# Patient Record
Sex: Male | Born: 1935 | Race: White | Hispanic: No | Marital: Married | Smoking: Former smoker
Health system: Southern US, Community
[De-identification: ages and names within clinical notes are randomized; demographics above are authoritative.]

## PROBLEM LIST (undated history)

## (undated) DIAGNOSIS — I2510 Atherosclerotic heart disease of native coronary artery without angina pectoris: Secondary | ICD-10-CM

## (undated) DIAGNOSIS — N183 Chronic kidney disease, stage 3 unspecified: Secondary | ICD-10-CM

## (undated) DIAGNOSIS — I639 Cerebral infarction, unspecified: Secondary | ICD-10-CM

## (undated) DIAGNOSIS — D469 Myelodysplastic syndrome, unspecified: Principal | ICD-10-CM

## (undated) DIAGNOSIS — I6529 Occlusion and stenosis of unspecified carotid artery: Secondary | ICD-10-CM

## (undated) DIAGNOSIS — M1990 Unspecified osteoarthritis, unspecified site: Secondary | ICD-10-CM

## (undated) DIAGNOSIS — N433 Hydrocele, unspecified: Secondary | ICD-10-CM

## (undated) DIAGNOSIS — E785 Hyperlipidemia, unspecified: Secondary | ICD-10-CM

## (undated) DIAGNOSIS — I219 Acute myocardial infarction, unspecified: Secondary | ICD-10-CM

## (undated) DIAGNOSIS — G459 Transient cerebral ischemic attack, unspecified: Secondary | ICD-10-CM

## (undated) DIAGNOSIS — I10 Essential (primary) hypertension: Secondary | ICD-10-CM

## (undated) DIAGNOSIS — R0602 Shortness of breath: Secondary | ICD-10-CM

## (undated) DIAGNOSIS — I4891 Unspecified atrial fibrillation: Secondary | ICD-10-CM

## (undated) DIAGNOSIS — I5032 Chronic diastolic (congestive) heart failure: Secondary | ICD-10-CM

## (undated) DIAGNOSIS — Z992 Dependence on renal dialysis: Secondary | ICD-10-CM

## (undated) DIAGNOSIS — I509 Heart failure, unspecified: Secondary | ICD-10-CM

## (undated) DIAGNOSIS — Z9289 Personal history of other medical treatment: Secondary | ICD-10-CM

## (undated) HISTORY — DX: Chronic diastolic (congestive) heart failure: I50.32

## (undated) HISTORY — DX: Atherosclerotic heart disease of native coronary artery without angina pectoris: I25.10

## (undated) HISTORY — DX: Transient cerebral ischemic attack, unspecified: G45.9

## (undated) HISTORY — DX: Myelodysplastic syndrome, unspecified: D46.9

## (undated) HISTORY — PX: SURGERY SCROTAL / TESTICULAR: SUR1316

## (undated) HISTORY — DX: Occlusion and stenosis of unspecified carotid artery: I65.29

## (undated) HISTORY — DX: Hyperlipidemia, unspecified: E78.5

## (undated) HISTORY — DX: Personal history of other medical treatment: Z92.89

## (undated) HISTORY — PX: CORONARY ANGIOPLASTY WITH STENT PLACEMENT: SHX49

## (undated) HISTORY — DX: Essential (primary) hypertension: I10

---
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
## 1968-03-29 HISTORY — PX: OTHER SURGICAL HISTORY: SHX169

## 1984-03-29 HISTORY — PX: NOSE SURGERY: SHX723

## 1991-03-30 HISTORY — PX: EYE SURGERY: SHX253

## 1991-03-30 HISTORY — PX: OTHER SURGICAL HISTORY: SHX169

## 1995-03-30 HISTORY — PX: OTHER SURGICAL HISTORY: SHX169

## 1997-10-18 ENCOUNTER — Ambulatory Visit (HOSPITAL_COMMUNITY): Admission: RE | Admit: 1997-10-18 | Discharge: 1997-10-18 | Payer: Self-pay | Admitting: Gastroenterology

## 1999-01-28 ENCOUNTER — Inpatient Hospital Stay (HOSPITAL_COMMUNITY): Admission: EM | Admit: 1999-01-28 | Discharge: 1999-01-30 | Payer: Self-pay | Admitting: Emergency Medicine

## 1999-01-28 ENCOUNTER — Encounter: Payer: Self-pay | Admitting: Emergency Medicine

## 1999-01-29 ENCOUNTER — Encounter: Payer: Self-pay | Admitting: Cardiology

## 2000-02-19 ENCOUNTER — Encounter: Payer: Self-pay | Admitting: Cardiology

## 2000-02-19 ENCOUNTER — Inpatient Hospital Stay (HOSPITAL_COMMUNITY): Admission: EM | Admit: 2000-02-19 | Discharge: 2000-02-21 | Payer: Self-pay | Admitting: Emergency Medicine

## 2001-06-06 ENCOUNTER — Encounter (INDEPENDENT_AMBULATORY_CARE_PROVIDER_SITE_OTHER): Payer: Self-pay | Admitting: Specialist

## 2001-06-06 ENCOUNTER — Ambulatory Visit (HOSPITAL_COMMUNITY): Admission: RE | Admit: 2001-06-06 | Discharge: 2001-06-06 | Payer: Self-pay | Admitting: Gastroenterology

## 2004-03-02 ENCOUNTER — Ambulatory Visit: Payer: Self-pay | Admitting: *Deleted

## 2004-03-03 ENCOUNTER — Ambulatory Visit: Payer: Self-pay

## 2004-04-02 ENCOUNTER — Ambulatory Visit: Payer: Self-pay | Admitting: Internal Medicine

## 2004-04-21 ENCOUNTER — Ambulatory Visit: Payer: Self-pay | Admitting: *Deleted

## 2004-06-23 ENCOUNTER — Ambulatory Visit: Payer: Self-pay | Admitting: *Deleted

## 2004-07-23 ENCOUNTER — Ambulatory Visit (HOSPITAL_COMMUNITY): Admission: RE | Admit: 2004-07-23 | Discharge: 2004-07-23 | Payer: Self-pay | Admitting: Gastroenterology

## 2004-07-23 ENCOUNTER — Encounter (INDEPENDENT_AMBULATORY_CARE_PROVIDER_SITE_OTHER): Payer: Self-pay | Admitting: *Deleted

## 2004-11-30 ENCOUNTER — Emergency Department (HOSPITAL_COMMUNITY): Admission: EM | Admit: 2004-11-30 | Discharge: 2004-11-30 | Payer: Self-pay | Admitting: Emergency Medicine

## 2004-12-03 ENCOUNTER — Ambulatory Visit: Payer: Self-pay | Admitting: Internal Medicine

## 2004-12-07 ENCOUNTER — Ambulatory Visit: Payer: Self-pay

## 2004-12-10 ENCOUNTER — Ambulatory Visit: Payer: Self-pay | Admitting: Cardiology

## 2004-12-15 ENCOUNTER — Ambulatory Visit: Payer: Self-pay | Admitting: Cardiology

## 2004-12-18 ENCOUNTER — Ambulatory Visit: Payer: Self-pay | Admitting: Cardiology

## 2004-12-18 ENCOUNTER — Inpatient Hospital Stay (HOSPITAL_BASED_OUTPATIENT_CLINIC_OR_DEPARTMENT_OTHER): Admission: RE | Admit: 2004-12-18 | Discharge: 2004-12-18 | Payer: Self-pay | Admitting: Cardiology

## 2004-12-24 ENCOUNTER — Ambulatory Visit: Payer: Self-pay | Admitting: Family Medicine

## 2005-01-07 ENCOUNTER — Ambulatory Visit: Payer: Self-pay

## 2005-01-19 ENCOUNTER — Encounter: Admission: RE | Admit: 2005-01-19 | Discharge: 2005-01-19 | Payer: Self-pay | Admitting: Family Medicine

## 2005-03-15 ENCOUNTER — Ambulatory Visit: Payer: Self-pay | Admitting: Cardiology

## 2005-09-16 ENCOUNTER — Ambulatory Visit: Payer: Self-pay | Admitting: Cardiology

## 2005-11-26 ENCOUNTER — Encounter: Payer: Self-pay | Admitting: Internal Medicine

## 2005-11-26 ENCOUNTER — Inpatient Hospital Stay (HOSPITAL_COMMUNITY): Admission: EM | Admit: 2005-11-26 | Discharge: 2005-11-27 | Payer: Self-pay | Admitting: Emergency Medicine

## 2005-11-26 ENCOUNTER — Ambulatory Visit: Payer: Self-pay | Admitting: Internal Medicine

## 2005-11-27 ENCOUNTER — Inpatient Hospital Stay (HOSPITAL_COMMUNITY): Admission: EM | Admit: 2005-11-27 | Discharge: 2005-11-30 | Payer: Self-pay | Admitting: Emergency Medicine

## 2006-06-30 ENCOUNTER — Ambulatory Visit (HOSPITAL_BASED_OUTPATIENT_CLINIC_OR_DEPARTMENT_OTHER): Admission: RE | Admit: 2006-06-30 | Discharge: 2006-06-30 | Payer: Self-pay | Admitting: Orthopedic Surgery

## 2006-09-20 ENCOUNTER — Ambulatory Visit: Payer: Self-pay | Admitting: Cardiology

## 2006-10-10 ENCOUNTER — Encounter: Admission: RE | Admit: 2006-10-10 | Discharge: 2006-10-10 | Payer: Self-pay | Admitting: Orthopaedic Surgery

## 2007-01-10 ENCOUNTER — Ambulatory Visit: Payer: Self-pay

## 2007-03-30 HISTORY — PX: TOTAL KNEE ARTHROPLASTY: SHX125

## 2007-03-30 HISTORY — PX: JOINT REPLACEMENT: SHX530

## 2007-05-18 ENCOUNTER — Inpatient Hospital Stay (HOSPITAL_COMMUNITY): Admission: RE | Admit: 2007-05-18 | Discharge: 2007-05-22 | Payer: Self-pay | Admitting: Orthopaedic Surgery

## 2007-09-07 ENCOUNTER — Ambulatory Visit: Payer: Self-pay | Admitting: Cardiology

## 2007-11-23 ENCOUNTER — Ambulatory Visit: Payer: Self-pay

## 2008-05-07 ENCOUNTER — Ambulatory Visit: Payer: Self-pay | Admitting: Cardiology

## 2008-05-09 ENCOUNTER — Ambulatory Visit: Payer: Self-pay

## 2008-07-02 ENCOUNTER — Ambulatory Visit: Payer: Self-pay | Admitting: Cardiology

## 2008-07-02 ENCOUNTER — Encounter: Payer: Self-pay | Admitting: Cardiology

## 2008-07-02 DIAGNOSIS — E785 Hyperlipidemia, unspecified: Secondary | ICD-10-CM

## 2008-07-02 DIAGNOSIS — I6529 Occlusion and stenosis of unspecified carotid artery: Secondary | ICD-10-CM

## 2008-07-02 DIAGNOSIS — N259 Disorder resulting from impaired renal tubular function, unspecified: Secondary | ICD-10-CM | POA: Insufficient documentation

## 2008-07-02 DIAGNOSIS — I10 Essential (primary) hypertension: Secondary | ICD-10-CM

## 2008-07-02 DIAGNOSIS — I2510 Atherosclerotic heart disease of native coronary artery without angina pectoris: Secondary | ICD-10-CM | POA: Insufficient documentation

## 2008-10-09 ENCOUNTER — Encounter: Payer: Self-pay | Admitting: Cardiology

## 2008-10-21 ENCOUNTER — Encounter: Payer: Self-pay | Admitting: Cardiology

## 2008-11-13 ENCOUNTER — Encounter (INDEPENDENT_AMBULATORY_CARE_PROVIDER_SITE_OTHER): Payer: Self-pay | Admitting: *Deleted

## 2009-01-02 ENCOUNTER — Ambulatory Visit: Payer: Self-pay | Admitting: Cardiology

## 2009-01-02 ENCOUNTER — Ambulatory Visit: Payer: Self-pay

## 2009-04-23 ENCOUNTER — Encounter: Payer: Self-pay | Admitting: Cardiology

## 2009-05-12 ENCOUNTER — Telehealth: Payer: Self-pay | Admitting: Cardiology

## 2009-05-26 ENCOUNTER — Telehealth: Payer: Self-pay | Admitting: Cardiology

## 2009-05-26 ENCOUNTER — Ambulatory Visit: Payer: Self-pay | Admitting: Cardiology

## 2009-06-01 LAB — CONVERTED CEMR LAB
BUN: 34 mg/dL — ABNORMAL HIGH (ref 6–23)
Basophils Absolute: 0 10*3/uL (ref 0.0–0.1)
CO2: 26 meq/L (ref 19–32)
Eosinophils Absolute: 0.1 10*3/uL (ref 0.0–0.7)
GFR calc non Af Amer: 22.86 mL/min (ref >60)
Glucose, Bld: 91 mg/dL (ref 70–99)
HCT: 32.7 % — ABNORMAL LOW (ref 39.0–52.0)
Hemoglobin: 11.2 g/dL — ABNORMAL LOW (ref 13.0–17.0)
Lymphs Abs: 0.7 10*3/uL (ref 0.7–4.0)
MCHC: 34.3 g/dL (ref 30.0–36.0)
Monocytes Absolute: 0.3 10*3/uL (ref 0.1–1.0)
Monocytes Relative: 7.8 % (ref 3.0–12.0)
Neutro Abs: 2.4 10*3/uL (ref 1.4–7.7)
Platelets: 173 10*3/uL (ref 150.0–400.0)
Potassium: 4.6 meq/L (ref 3.5–5.1)
RDW: 13.5 % (ref 11.5–14.6)
TSH: 2.01 microintl units/mL (ref 0.35–5.50)

## 2009-06-04 ENCOUNTER — Encounter: Payer: Self-pay | Admitting: Cardiology

## 2009-06-06 ENCOUNTER — Ambulatory Visit: Payer: Self-pay | Admitting: Cardiology

## 2009-06-06 DIAGNOSIS — I498 Other specified cardiac arrhythmias: Secondary | ICD-10-CM

## 2009-06-13 ENCOUNTER — Encounter: Payer: Self-pay | Admitting: Cardiology

## 2009-06-16 ENCOUNTER — Ambulatory Visit: Payer: Self-pay | Admitting: Cardiology

## 2009-06-30 ENCOUNTER — Telehealth: Payer: Self-pay | Admitting: Cardiology

## 2009-08-01 ENCOUNTER — Encounter: Payer: Self-pay | Admitting: Cardiology

## 2009-08-05 ENCOUNTER — Ambulatory Visit: Payer: Self-pay | Admitting: Cardiology

## 2009-08-05 DIAGNOSIS — I428 Other cardiomyopathies: Secondary | ICD-10-CM

## 2009-08-05 DIAGNOSIS — R55 Syncope and collapse: Secondary | ICD-10-CM

## 2009-08-05 DIAGNOSIS — I4949 Other premature depolarization: Secondary | ICD-10-CM

## 2009-08-22 ENCOUNTER — Ambulatory Visit: Payer: Self-pay

## 2009-08-22 ENCOUNTER — Ambulatory Visit (HOSPITAL_COMMUNITY): Admission: RE | Admit: 2009-08-22 | Discharge: 2009-08-22 | Payer: Self-pay | Admitting: Cardiology

## 2009-08-22 ENCOUNTER — Ambulatory Visit: Payer: Self-pay | Admitting: Cardiovascular Disease

## 2009-08-27 ENCOUNTER — Telehealth: Payer: Self-pay | Admitting: Cardiology

## 2009-10-22 ENCOUNTER — Encounter: Payer: Self-pay | Admitting: Cardiology

## 2009-11-07 ENCOUNTER — Ambulatory Visit: Payer: Self-pay | Admitting: Cardiology

## 2009-12-09 ENCOUNTER — Encounter: Payer: Self-pay | Admitting: Cardiology

## 2009-12-31 ENCOUNTER — Encounter: Payer: Self-pay | Admitting: Cardiology

## 2010-01-23 ENCOUNTER — Telehealth: Payer: Self-pay | Admitting: Cardiology

## 2010-02-13 ENCOUNTER — Encounter: Admission: RE | Admit: 2010-02-13 | Discharge: 2010-02-13 | Payer: Self-pay | Admitting: Orthopaedic Surgery

## 2010-02-27 ENCOUNTER — Encounter: Admission: RE | Admit: 2010-02-27 | Discharge: 2010-02-27 | Payer: Self-pay | Admitting: Orthopaedic Surgery

## 2010-03-29 DIAGNOSIS — I219 Acute myocardial infarction, unspecified: Secondary | ICD-10-CM

## 2010-03-29 HISTORY — DX: Acute myocardial infarction, unspecified: I21.9

## 2010-04-26 LAB — CONVERTED CEMR LAB
BUN: 28 mg/dL — ABNORMAL HIGH (ref 6–23)
Calcium: 9.3 mg/dL (ref 8.4–10.5)
Chloride: 108 meq/L (ref 96–112)
Creatinine, Ser: 2.3 mg/dL — ABNORMAL HIGH (ref 0.4–1.5)
GFR calc non Af Amer: 28.68 mL/min (ref >60)

## 2010-04-30 NOTE — Letter (Signed)
Summary: [facility] Office Note   [facility] Office Note   Imported By: Roderic Ovens 06/09/2009 14:41:59  _____________________________________________________________________  External Attachment:    Type:   Image     Comment:   External Document

## 2010-04-30 NOTE — Progress Notes (Signed)
Summary: REFILL--amlodipine   Phone Note Refill Request Message from:  Patient on May 12, 2009 8:43 AM  Refills Requested: Medication #1:  AMLODIPINE BESYLATE 10 MG TABS one by mouth daily SEND [facility] [phone] PT GOING OUT OF TOWN   AND NEEDS RFILL TODAY.  Initial call taken by: Judie Grieve,  May 12, 2009 8:44 AM  Follow-up for Phone Call        Rx sent to pharmacy. Pt notified. Marrion Coy, CNA  May 12, 2009 8:57 AM  Follow-up by: Marrion Coy, CNA,  May 12, 2009 8:57 AM    Prescriptions: AMLODIPINE BESYLATE 10 MG TABS (AMLODIPINE BESYLATE) one by mouth daily  #30 x 6   Entered by:   Marrion Coy, CNA   Authorized by:   [name], MD, [facility]   Signed by:   Marrion Coy, CNA on 05/12/2009   Method used:   Electronically to        [facility]  [phone]* (retail)       [address]       [place], [place]  [address]       Ph: [phone] or [phone]       Fax: [phone]   RxID:   [id number]

## 2010-04-30 NOTE — Progress Notes (Signed)
Summary: test result- echo  Phone Note Call from Patient Call back at Home Phone [phone]   Caller: Patient Reason for Call: Talk to Nurse, Lab or Test Results Details for Reason: echo.  Initial call taken by: Lorne Skeens,  August 27, 2009 12:34 PM  Follow-up for Phone Call        N/A X1 Scherrie Bateman, LPN  August 28, 979 1:31 PM  Called pt no answer service. Ollen Gross, RN, BSN  August 27, 2009 4:55 PM   Additional Follow-up for Phone Call Additional follow up Details #1::        Discussed results with the patient. Additional Follow-up by: [name], MD, [facility],  August 27, 2009 5:05 PM

## 2010-04-30 NOTE — Miscellaneous (Signed)
Summary: 24 hour holter  Clinical Lists Changes  Orders: Added new Referral order of Holter Monitor (Holter Monitor) - Signed

## 2010-04-30 NOTE — Letter (Signed)
Summary: [facility]  [facility]   Imported By: Marylou Mccoy 01/01/2010 15:27:13  _____________________________________________________________________  External Attachment:    Type:   Image     Comment:   External Document

## 2010-04-30 NOTE — Miscellaneous (Signed)
  Clinical Lists Changes  Observations: Added new observation of HOLTERFIND: NSR Sinus brady PVS V Beats NSVT No sustained pauses (06/16/2009 10:14)      Holter Monitor  Procedure date:  06/16/2009  Findings:      NSR Sinus brady PVS V Beats NSVT No sustained pauses

## 2010-04-30 NOTE — Assessment & Plan Note (Signed)
Summary: 2 month rov./sl   Visit Type:  Follow-up Primary Provider:  Marjory Lies, MD  CC:  CAD and PVCs.  History of Present Illness: The patient presents for followup of his known coronary disease, palpitations and hypertension. At the last appointment I had him increase his Norvasc to 7.5 mg daily as his blood pressure was not well-controlled. He has worn a Holter monitor recently which demonstrated occasional premature ventricular contractions which were about 1.4% of his total beats. He seems to have more of these at night and he feels this. He is concerned by it but not distressed. He does not have presyncope or syncope. He does not have chest pressure, neck or arm discomfort with activity. However, he will occasionally get some chest discomfort that is sporadic. It is a dull ache. It's been a stable pattern for about 5 months. It comes and goes spontaneously. He cannot bring it on though he can do some vigorous work such as working in the yard or vacuuming.   Current Medications (verified): 1)  Lipitor 10 Mg Tabs (Atorvastatin Calcium) .... One By Mouth Daily 2)  Clonidine Hcl 0.2 Mg Tabs (Clonidine Hcl) .Marland Kitchen.. 1 By Mouth Two Times A Day 3)  Amlodipine Besylate 5 Mg Tabs (Amlodipine Besylate) .... One and 1/2 Tablet Daily 4)  Hydrochlorothiazide 12.5 Mg Caps (Hydrochlorothiazide) .... Daily 5)  Acetaminophen 325 Mg  Tabs (Acetaminophen) .... As Needed 6)  Aspirin 325 Mg  Tabs (Aspirin) .... As Needed  Allergies (verified): 1)  ! * Oxycontin 2)  ! Prednisone  Past History:  Past Medical History: Reviewed history from 01/02/2009 and no changes required. Coronary artery disease (status post stent and   angioplasty of an obtuse margin in 1997.  Catheterization in 2006,   demonstrated that the left main was normal.  The LAD had proximal 50%   stenosis.  There was mid 60% stenosis.  There was 60-70% stenosis at the   mid diagonal.  There was diffuse 30-40% stenosis in the proximal mid    segment.  The circumflex had a large ramus intermedius with 50%   stenosis.  The first obtuse margin was a small vessel with 80-90%   stenosis.  The right coronary artery was dominant.  There was a stent   for the PDA.  Not sure when this was placed.  He had a 25-30% stenosis   in this.  PDA had 50% stenosis.  The EF had 65% stenosis)  Mild chronic renal insufficiency  Mild carotid artery stenosis  TIAs  Hypertension,   Hyperlipidemia,  Past Surgical History: Reviewed history from 06/27/2008 and no changes required.  Tonsillectomy, adenoidectomy, nasal surgery,   appendectomy, left orchiectomy, left knee replacement.   Review of Systems       As stated in the HPI and negative for all other systems.   Vital Signs:  Patient profile:   77 year old male Height:      73 inches Weight:      199 pounds BMI:     26.35 Pulse rate:   62 / minute Resp:     16 per minute BP sitting:   154 / 80  (right arm)  Vitals Entered By: Marrion Coy, CNA (Aug 05, 2009 11:43 AM)  Physical Exam  General:  Well developed, well nourished, in no acute distress. Eyes:  PERRLA/EOM intact; conjunctiva and lids normal. Mouth:  Teeth, gums and palate normal. Oral mucosa normal. Neck:  Neck supple, no JVD. No masses, thyromegaly or abnormal  cervical nodes. Chest Wall:  no deformities or breast masses noted Lungs:  Clear bilaterally to auscultation and percussion. Abdomen:  Bowel sounds positive; abdomen soft and non-tender without masses, organomegaly, or hernias noted. No hepatosplenomegaly. Msk:  Back normal, normal gait. Muscle strength and tone normal. Extremities:  No clubbing or cyanosis. Neurologic:  Alert and oriented x 3. Skin:  Intact without lesions or rashes. Psych:  Normal affect.   Detailed Cardiovascular Exam  Neck    Carotids: Carotids full and equal bilaterally without bruits.      Neck Veins: Normal, no JVD.    Heart    Inspection: no deformities or lifts noted.       Palpation: normal PMI with no thrills palpable.      Auscultation: regular rate and rhythm, S1, S2 without murmurs, rubs, gallops, or clicks.    Vascular    Abdominal Aorta: no palpable masses, pulsations, or audible bruits.      Femoral Pulses: normal femoral pulses bilaterally.      Pedal Pulses: normal pedal pulses bilaterally.      Radial Pulses: normal radial pulses bilaterally.      Peripheral Circulation: no clubbing, cyanosis, or edema noted with normal capillary refill.     Impression & Recommendations:  Problem # 1:  PREMATURE VENTRICULAR CONTRACTIONS (ICD-427.69) The patient feels premature ventricular contractions which have been documented. The last ejection fraction was well preserved. However, I would like to reevaluate this with echocardiogram given recent symptoms. Otherwise we'll make no change to his regimen pending this result.  Problem # 2:  ESSENTIAL HYPERTENSION, BENIGN (ICD-401.1) His blood pressure is controlled. It is elevated here in the office but he takes it at home and says that it is under much better control with the slightly increased Norvasc. Therefore, I will make no further change.  Problem # 3:  CAD (ICD-414.00) He needs no further cardiovascular testing. He had a stress perfusion study last year which demonstrated no ischemia. His chest pain is atypical. He will let me know if this changes in character or quality.  Problem # 4:  RENAL INSUFFICIENCY (ICD-588.9) His last creatinine was back down to 2.3 from 2.8. No change in therapy is indicated.  Problem # 5:  SYNCOPE (ICD-780.2) He has had no further syncopal episodes since changing his medicines. I think this was related to volume depletion. No symptomatic bradycardia arrhythmias have been identified.  Other Orders: Echocardiogram (Echo)  Patient Instructions: 1)  Your physician recommends that you schedule a follow-up appointment IN 3 MONTHS WITH DR [facility] 2)  Your physician recommends that  you continue on your current medications as directed. Please refer to the Current Medication list given to you today. 3)  Your physician has requested that you have an echocardiogram.  Echocardiography is a painless test that uses sound waves to create images of your heart. It provides your doctor with information about the size and shape of your heart and how well your heart's chambers and valves are working.  This procedure takes approximately one hour. There are no restrictions for this procedure.

## 2010-04-30 NOTE — Progress Notes (Signed)
Summary: PT HAVING DIZZINESS AND BLACKED OUT SATURDAY   Phone Note Call from Patient Call back at Home Phone [phone]   Caller: Patient Summary of Call: PT HAVING DIZZINESS AND HAVE BLACKED OUT SATURDAY. Initial call taken by: Judie Grieve,  May 26, 2009 8:06 AM  Follow-up for Phone Call        got up saturday am walk to kitchen felt dizzy sat on the couch and then woke up in the floor with his puppy licking his face.  bp and HR "ok"  does get dizzy with position changes.  no signs of dehydation.  taking all meds.  mouth is always dry.  very short of breathe. Just walking up 3 stairs makes him very short of breath.  occaison chest pain - short sharp shooting pain,  one to two seconds - feels like muscle.  He can feel his heart skipping beats about ever 6th beat.  Will ask Dr Antoine Poche were to put him on the schedule.  Sander Nephew, RN Follow-up by: Charolotte Capuchin, RN,  May 26, 2009 10:31 AM  Additional Follow-up for Phone Call Additional follow up Details #1::        pt Dr Antoine Poche pt to have TSH, CBC, BMP and orthostatic BP's  pt aware. Additional Follow-up by: Charolotte Capuchin, RN,  May 26, 2009 10:32 AM     Appended Document: PT HAVING DIZZINESS AND BLACKED OUT SATURDAY orthostatics   laying hr 46 bp 121/73 sitting HR 70 108/61  dizzy standing  O mins .HR 69 105/68 dizzy standing 2 mins HR 64 BP 92/62  dizzy standing 5 mins  Hr 63 96.74 no s/s  Dr Antoine Poche aware, pt to follow up in 2 weeks after stopping HCTZ, deceasing Norvasc to 5 mg a day and keeping a BP diary.     Appended Document: PT HAVING DIZZINESS AND BLACKED OUT SATURDAY Pt will be seen for nurse visit and labs.

## 2010-04-30 NOTE — Letter (Signed)
Summary: Generic Letter  Architectural technologist, Main Office  [address]   [place], [place] [address]   Phone: [phone]  Fax: [phone]            May 26, 2009 MRN: [id number]    Tracy [name] [address] [place], [place]  [address]    Dear Mr. Tapanes,  Dr Antoine Poche would like for you to stop your HCTZ, decrease Norvasc to 5 mg a day and keep a blood pressure diary.  Please call if you continue to have dizziness after the medication changes     Sincerely,      Charolotte Capuchin, RN  This letter has been electronically signed by your physician.

## 2010-04-30 NOTE — Progress Notes (Signed)
Summary: needs refill asap  Phone Note Refill Request Call back at Home Phone [phone] Message from:  Patient on [facility] [address]  Refills Requested: Medication #1:  CLONIDINE HCL 0.2 MG TABS 1 by mouth two times a day  Medication #2:  AMLODIPINE BESYLATE 5 MG TABS one and 1/2 tablet daily Initial call taken by: Omer Jack,  January 23, 2010 11:21 AM  Follow-up for Phone Call        spoke with pt. rx sent inot pharmacy Marrion Coy, CNA  January 23, 2010 12:56 PM  Follow-up by: Marrion Coy, CNA,  January 23, 2010 12:56 PM

## 2010-04-30 NOTE — Assessment & Plan Note (Signed)
Summary: 2 week rov  f/u blood pressure  pfh,rn  Medications Added AMLODIPINE BESYLATE 5 MG TABS (AMLODIPINE BESYLATE) one and 1/2 tablet daily      Allergies Added:   Primary Provider:  Marjory Lies, MD   History of Present Illness: The she presents for followup after a syncopal episode last month. At that time he was seen in the clinic for a nurse visit and was noted to be hypotensive. Labs on that day demonstrated his creatinine to be up from 2.14 previously to 2.8. I had him stop his hydrochlorothiazide and reduce his Norvasc from 10-5 mg. Since that time he has felt better. He has had no presyncope or syncope. He has had no chest pain, neck or arm discomfort. He has had no palpitations.  He has had no new shortness of breath, PND or orthopnea. He has been keeping his blood pressure diary.  His blood pressures have been elevated typically in the 150s. He had 2 episodes of dizziness associated with heart rates in the 40s.  Current Medications (verified): 1)  Lipitor 10 Mg Tabs (Atorvastatin Calcium) .... One By Mouth Daily 2)  Clonidine Hcl 0.2 Mg Tabs (Clonidine Hcl) .Marland Kitchen.. 1 By Mouth Two Times A Day 3)  Amlodipine Besylate 10 Mg Tabs (Amlodipine Besylate) .... One By Mouth Daily 4)  Hydrochlorothiazide 12.5 Mg Caps (Hydrochlorothiazide) .... Daily 5)  Acetaminophen 325 Mg  Tabs (Acetaminophen) .... As Needed  Allergies (verified): 1)  ! * Oxycontin 2)  ! Prednisone  Past History:  Past Medical History: Reviewed history from 01/02/2009 and no changes required. Coronary artery disease (status post stent and   angioplasty of an obtuse margin in 1997.  Catheterization in 2006,   demonstrated that the left main was normal.  The LAD had proximal 50%   stenosis.  There was mid 60% stenosis.  There was 60-70% stenosis at the   mid diagonal.  There was diffuse 30-40% stenosis in the proximal mid   segment.  The circumflex had a large ramus intermedius with 50%   stenosis.  The first  obtuse margin was a small vessel with 80-90%   stenosis.  The right coronary artery was dominant.  There was a stent   for the PDA.  Not sure when this was placed.  He had a 25-30% stenosis   in this.  PDA had 50% stenosis.  The EF had 65% stenosis)  Mild chronic renal insufficiency  Mild carotid artery stenosis  TIAs  Hypertension,   Hyperlipidemia,  Past Surgical History: Reviewed history from 06/27/2008 and no changes required.  Tonsillectomy, adenoidectomy, nasal surgery,   appendectomy, left orchiectomy, left knee replacement.   Review of Systems       As stated in the HPI and negative for all other systems.   Vital Signs:  Patient profile:   77 year old male Height:      73 inches Weight:      205 pounds Pulse rate:   50 / minute Resp:     16 per minute BP sitting:   158 / 88  (right arm)  Vitals Entered By: Marrion Coy, CNA (June 06, 2009 10:40 AM)  Physical Exam  General:  Well developed, well nourished, in no acute distress. Head:  normocephalic and atraumatic Eyes:  PERRLA/EOM intact; conjunctiva and lids normal. Mouth:  Teeth, gums and palate normal. Oral mucosa normal. Neck:  Neck supple, no JVD. No masses, thyromegaly or abnormal cervical nodes. Chest Wall:  no deformities or breast  masses noted Lungs:  Clear bilaterally to auscultation and percussion. Abdomen:  Bowel sounds positive; abdomen soft and non-tender without masses, organomegaly, or hernias noted. No hepatosplenomegaly. Msk:  Back normal, normal gait. Muscle strength and tone normal. Extremities:  No clubbing or cyanosis. Neurologic:  Alert and oriented x 3. Skin:  Intact without lesions or rashes. Cervical Nodes:  no significant adenopathy Axillary Nodes:  no significant adenopathy Inguinal Nodes:  no significant adenopathy Psych:  Normal affect.   Detailed Cardiovascular Exam  Neck    Carotids: Carotids full and equal bilaterally without bruits.      Neck Veins: Normal, no JVD.     Heart    Inspection: no deformities or lifts noted.      Palpation: normal PMI with no thrills palpable.      Auscultation: regular rate and rhythm, S1, S2 without murmurs, rubs, gallops, or clicks.    Vascular    Abdominal Aorta: no palpable masses, pulsations, or audible bruits.      Femoral Pulses: normal femoral pulses bilaterally.      Pedal Pulses: normal pedal pulses bilaterally.      Radial Pulses: normal radial pulses bilaterally.      Peripheral Circulation: no clubbing, cyanosis, or edema noted with normal capillary refill.     EKG  Procedure date:  06/06/2009  Findings:      sus bradycardia, rate 50, first degree AV block, left axis deviation, left anterior fascicular block, LVH with repolarization changes  Impression & Recommendations:  Problem # 1:  ESSENTIAL HYPERTENSION, BENIGN (ICD-401.1) His blood pressure is now creeping back up. He will remain off the HCTZ but I will go up to Norvasc 7.5 mg daily and he will keep his blood pressure diary.  Orders: TLB-BMP (Basic Metabolic Panel-BMET) (80048-METABOL)  Problem # 2:  RENAL INSUFFICIENCY (ICD-588.9) His Creatinine was up. I will check it again today and will refer her these records to Dr. Caryn Section.  Problem # 3:  CAROTID STENOSIS (ICD-433.10) He had only mild carotid stenosis when this was checked recently. I will follow this up as clinically indicated.  Problem # 4:  CAD (ICD-414.00) Idon't suspect obstructive coronary disease as an etiology. He will continue with risk reduction. Orders: EKG w/ Interpretation (93000)  His updated medication list for this problem includes:    Amlodipine Besylate 5 Mg Tabs (Amlodipine besylate) ..... One and 1/2 tablet daily  Problem # 5:  BRADYCARDIA (ICD-427.89) I will check a 24-hour Holter monitor and he will let me know if he has any further dizziness or palpitations. Certainly this could have been a bradycardic event with his conduction abnormalities. He may ultimately  need an event monitor.  Patient Instructions: 1)  Your physician recommends that you schedule a follow-up appointment in: 2 months with Dr Antoine Poche 2)  Your physician recommends that you  lab work today: basic metabolic panel  401.1 v58.69 3)  Your physician has recommended you make the following change in your medication:  decrease your amlodipine to 7.5 mg a day and stay off of your HCTZ 4)  Your physician has recommended that you wear a holter monitor.  Holter monitors are medical devices that record the heart's electrical activity. Doctors most often use these monitors to diagnose arrhythmias. Arrhythmias are problems with the speed or rhythm of the heartbeat. The monitor is a small, portable device. You can wear one while you do your normal daily activities. This is usually used to diagnose what is causing palpitations/syncope (passing out). Prescriptions:  AMLODIPINE BESYLATE 5 MG TABS (AMLODIPINE BESYLATE) one and 1/2 tablet daily  #45 x 11   Entered by:   Charolotte Capuchin, RN   Authorized by:   [name], MD, [facility]   Signed by:   Charolotte Capuchin, RN on 06/06/2009   Method used:   Electronically to        [facility]  [phone]* (retail)       [address]       [place], [place]  [address]       Ph: [phone] or [phone]       Fax: [phone]   RxID:   [id number]

## 2010-04-30 NOTE — Assessment & Plan Note (Signed)
Summary: [email or web address]   Visit Type:  Follow-up Primary Provider:  Marjory Lies, MD  CC:  CAD.  History of Present Illness: The patient presents for follow up.  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 He is enjoying this and remaining active. He will rarely gets some short episodes of shortness of breath but this resolves quickly. He does not describe chest pressure, neck or arm discomfort. He does not describe PND or orthopnea. He did have an echocardiogram in May demonstrating an EF of 50% with no significant valvular abnormalities. He's had no palpitations, presyncope or syncope.  Current Medications (verified): 1)  Lipitor 10 Mg Tabs (Atorvastatin Calcium) .... One By Mouth Daily 2)  Clonidine Hcl 0.2 Mg Tabs (Clonidine Hcl) .Marland Kitchen.. 1 By Mouth Two Times A Day 3)  Amlodipine Besylate 5 Mg Tabs (Amlodipine Besylate) .... One and 1/2 Tablet Daily 4)  Hydrochlorothiazide 12.5 Mg Caps (Hydrochlorothiazide) .... Daily 5)  Acetaminophen 325 Mg  Tabs (Acetaminophen) .... As Needed 6)  Aspirin 325 Mg  Tabs (Aspirin) .... As Needed  Allergies (verified): 1)  ! * Oxycontin 2)  ! Prednisone  Past History:  Past Medical History: Reviewed history from 01/02/2009 and no changes required. Coronary artery disease (status post stent and   angioplasty of an obtuse margin in 1997.  Catheterization in 2006,   demonstrated that the left main was normal.  The LAD had proximal 50%   stenosis.  There was mid 60% stenosis.  There was 60-70% stenosis at the   mid diagonal.  There was diffuse 30-40% stenosis in the proximal mid   segment.  The circumflex had a large ramus intermedius with 50%   stenosis.  The first obtuse margin was a small vessel with 80-90%   stenosis.  The right coronary artery was dominant.  There was a stent   for the PDA.  Not sure when this was placed.  He had a 25-30% stenosis   in this.  PDA had 50% stenosis.  The EF had 65%  stenosis)  Mild chronic renal insufficiency  Mild carotid artery stenosis  TIAs  Hypertension,   Hyperlipidemia,  Past Surgical History: Reviewed history from 06/27/2008 and no changes required.  Tonsillectomy, adenoidectomy, nasal surgery,   appendectomy, left orchiectomy, left knee replacement.   Review of Systems       As stated in the HPI and negative for all other systems.   Vital Signs:  Patient profile:   77 year old male Height:      73 inches Weight:      186 pounds BMI:     24.63 Pulse rate:   57 / minute Resp:     16 per minute BP sitting:   122 / 58  (right arm)  Vitals Entered By: Marrion Coy, CNA (November 07, 2009 9:00 AM)  Physical Exam  General:  Well developed, well nourished, in no acute distress. Head:  normocephalic and atraumatic Neck:  Neck supple, no JVD. No masses, thyromegaly or abnormal cervical nodes. Lungs:  Clear bilaterally to auscultation and percussion. Abdomen:  Bowel sounds positive; abdomen soft and non-tender without masses, organomegaly, or hernias noted. No hepatosplenomegaly. Msk:  Back normal, normal gait. Muscle strength and tone normal. Extremities:  No clubbing or cyanosis. Neurologic:  Alert and oriented x 3. Skin:  Intact without lesions or rashes. Cervical Nodes:  no significant adenopathy Psych:  Normal affect.   Detailed Cardiovascular Exam  Neck  Carotids: Carotids full and equal bilaterally without bruits.      Neck Veins: Normal, no JVD.    Heart    Inspection: no deformities or lifts noted.      Palpation: normal PMI with no thrills palpable.      Auscultation: regular rate and rhythm, S1, S2 without murmurs, rubs, gallops, or clicks.    Vascular    Abdominal Aorta: no palpable masses, pulsations, or audible bruits.      Femoral Pulses: normal femoral pulses bilaterally.      Pedal Pulses: normal pedal pulses bilaterally.      Radial Pulses: normal radial pulses bilaterally.      Peripheral Circulation:  no clubbing, cyanosis, or edema noted with normal capillary refill.     EKG  Procedure date:  11/07/2009  Findings:      sinus rhythm, rate 57, first degree AV block, left axis deviation, premature atrial contractions, no acute ST-T wave changes  Impression & Recommendations:  Problem # 1:  SYNCOPE (ICD-780.2) He has had no further episodes of this or lightheadedness. No further evaluation is planned.  Problem # 2:  CARDIOMYOPATHY (ICD-425.4)  Problem # 3:  HYPERLIPIDEMIA (ICD-272.4) He will come back for fasting lipid profile at his convenience with a goal LDL less than 100 and HDL greater than 40.  Problem # 4:  RENAL INSUFFICIENCY (ICD-588.9) He is seeing the nephrologist on and had blood work drawn today.  Problem # 5:  CAD (ICD-414.00)  Nfurther cardiovascular testing is suggested. He will continue with risk reduction.  His updated medication list for this problem includes:    Amlodipine Besylate 5 Mg Tabs (Amlodipine besylate) ..... One and 1/2 tablet daily    Aspirin 325 Mg Tabs (Aspirin) .Marland Kitchen... As needed  Other Orders: EKG w/ Interpretation (93000)  Patient Instructions: 1)  Your physician recommends that you schedule a follow-up appointment in: one year with Dr Antoine Poche 2)  Your physician recommends that you continue on your current medications as directed. Please refer to the Current Medication list given to you today. 3)  Your physician recommends that you return for a FASTING lipid and profile: 272.0 v58.69

## 2010-04-30 NOTE — Progress Notes (Signed)
Summary: test result- holter   Phone Note Call from Patient Call back at Home Phone [phone]   Caller: Patient Reason for Call: Talk to Doctor Details for Reason: holter,  Initial call taken by: Lorne Skeens,  June 30, 2009 9:14 AM  Follow-up for Phone Call        PT calling back about results on holt monitor Judie Grieve  June 30, 2009 4:19 PM  Pt aware of results  Follow-up by: Charolotte Capuchin, RN,  June 30, 2009 6:00 PM

## 2010-04-30 NOTE — Procedures (Signed)
Summary: summary report  summary report   Imported By: Mirna Mires 07/03/2009 10:08:19  _____________________________________________________________________  External Attachment:    Type:   Image     Comment:   External Document

## 2010-04-30 NOTE — Procedures (Signed)
Summary: [facility]   [facility]   Imported By: Roderic Ovens 02/17/2010 14:25:56  _____________________________________________________________________  External Attachment:    Type:   Image     Comment:   External Document

## 2010-04-30 NOTE — Miscellaneous (Signed)
  Clinical Lists Changes  Observations: Added new observation of US CAROTID: Stable, mild plaque with 0-39% bilateral ICA stenosis Interval right vetebral artery occlusion  f/u 2 years (01/02/2009 15:02)      Carotid Doppler  Procedure date:  01/02/2009  Findings:      Stable, mild plaque with 0-39% bilateral ICA stenosis Interval right vetebral artery occlusion  f/u 2 years

## 2010-04-30 NOTE — Miscellaneous (Signed)
  Clinical Lists Changes  Observations: Added new observation of ECHOINTERP:  - Left ventricle: Distal septal and inferobasal hypokinesis The       cavity size was mildly dilated. Wall thickness was increased in a       pattern of moderate LVH. The estimated ejection fraction was 50%.     - Mitral valve: Mild regurgitation.     - Left atrium: The atrium was mildly dilated.     - Atrial septum: No defect or patent foramen ovale was identified.     - Pulmonary arteries: PA peak pressure: 32mm Hg (S). (08/22/2009 14:43)      Echocardiogram  Procedure date:  08/22/2009  Findings:       - Left ventricle: Distal septal and inferobasal hypokinesis The       cavity size was mildly dilated. Wall thickness was increased in a       pattern of moderate LVH. The estimated ejection fraction was 50%.     - Mitral valve: Mild regurgitation.     - Left atrium: The atrium was mildly dilated.     - Atrial septum: No defect or patent foramen ovale was identified.     - Pulmonary arteries: PA peak pressure: 32mm Hg (S).

## 2010-06-11 ENCOUNTER — Encounter: Payer: Self-pay | Admitting: Cardiology

## 2010-06-11 ENCOUNTER — Other Ambulatory Visit: Payer: Self-pay | Admitting: Cardiology

## 2010-06-11 ENCOUNTER — Ambulatory Visit (INDEPENDENT_AMBULATORY_CARE_PROVIDER_SITE_OTHER): Payer: Medicare Other | Admitting: Cardiology

## 2010-06-11 DIAGNOSIS — R0602 Shortness of breath: Secondary | ICD-10-CM

## 2010-06-11 DIAGNOSIS — R0609 Other forms of dyspnea: Secondary | ICD-10-CM

## 2010-06-11 DIAGNOSIS — I10 Essential (primary) hypertension: Secondary | ICD-10-CM

## 2010-06-11 DIAGNOSIS — R0989 Other specified symptoms and signs involving the circulatory and respiratory systems: Secondary | ICD-10-CM | POA: Insufficient documentation

## 2010-06-11 LAB — BRAIN NATRIURETIC PEPTIDE: Pro B Natriuretic peptide (BNP): 343.4 pg/mL — ABNORMAL HIGH (ref 0.0–100.0)

## 2010-06-16 NOTE — Assessment & Plan Note (Signed)
Summary: rov/sob/per pt call=mj   Visit Type:  Follow-up Primary Provider:  Marjory Lies, MD  CC:  CAD and HTN.  History of Present Illness: Patient presents for followup. He has had recent extensive evaluation to include here an echo demonstrating an EF of 55%. He is on a Holter more recently with PVCs but no sustained arrhythmia. His last stress test demonstrated no high-risk findings in February 2010.  He denies PND or orthopnea but he isn't describing dyspnea with exertion. He is sighing quite a bit in the office.  He says he can work out for 20 minutes at [employer] but he does get more dyspneic than previous. He is not describing chest pressure, neck or arm discomfort though he has some fleeting sharp shooting pains. He does have palpitations but no presyncope or syncope.   Current Medications (verified): 1)  Lipitor 10 Mg Tabs (Atorvastatin Calcium) .... One By Mouth Daily 2)  Clonidine Hcl 0.2 Mg Tabs (Clonidine Hcl) .Marland Kitchen.. 1 By Mouth Two Times A Day 3)  Amlodipine Besylate 5 Mg Tabs (Amlodipine Besylate) .... One and 1/2 Tablet Daily 4)  Acetaminophen 325 Mg  Tabs (Acetaminophen) .... As Needed 5)  Flomax 0.4 Mg Caps (Tamsulosin Hcl) .Marland Kitchen.. 1 By Mouth Daily  Allergies (verified): 1)  ! * Oxycontin 2)  ! Prednisone  Past History:  Past Medical History: Reviewed history from 01/02/2009 and no changes required. Coronary artery disease (status post stent and   angioplasty of an obtuse margin in 1997.  Catheterization in 2006,   demonstrated that the left main was normal.  The LAD had proximal 50%   stenosis.  There was mid 60% stenosis.  There was 60-70% stenosis at the   mid diagonal.  There was diffuse 30-40% stenosis in the proximal mid   segment.  The circumflex had a large ramus intermedius with 50%   stenosis.  The first obtuse margin was a small vessel with 80-90%   stenosis.  The right coronary artery was dominant.  There was a stent   for the PDA.  Not sure when this was  placed.  He had a 25-30% stenosis   in this.  PDA had 50% stenosis.  The EF had 65% stenosis)  Mild chronic renal insufficiency  Mild carotid artery stenosis  TIAs  Hypertension,   Hyperlipidemia,  Past Surgical History: Reviewed history from 06/27/2008 and no changes required.  Tonsillectomy, adenoidectomy, nasal surgery,   appendectomy, left orchiectomy, left knee replacement.   Review of Systems       As stated in the HPI and negative for all other systems.   Vital Signs:  Patient profile:   77 year old male Height:      73 inches Weight:      205 pounds BMI:     27.14 Pulse rate:   65 / minute Resp:     16 per minute BP sitting:   168 / 88  (right arm)  Vitals Entered By: Marrion Coy, CNA (June 11, 2010 12:43 PM)  Physical Exam  General:  Well developed, well nourished, in no acute distress. Head:  normocephalic and atraumatic Eyes:  PERRLA/EOM intact; conjunctiva and lids normal. Mouth:  Teeth, gums and palate normal. Oral mucosa normal. Neck:  Neck supple, no JVD. No masses, thyromegaly or abnormal cervical nodes. Chest Wall:  no deformities or breast masses noted Lungs:  Clear bilaterally to auscultation and percussion. Abdomen:  Bowel sounds positive; abdomen soft and non-tender without masses, organomegaly, or hernias  noted. No hepatosplenomegaly. Msk:  Back normal, normal gait. Muscle strength and tone normal. Pulses:  normal pedal pulses bilaterally.   Extremities:  No clubbing or cyanosis. Neurologic:  Alert and oriented x 3. Skin:  Intact without lesions or rashes. Cervical Nodes:  no significant adenopathy Psych:  Normal affect.   Detailed Cardiovascular Exam  Neck    Carotids: Carotids full and equal bilaterally without bruits.      Neck Veins: Normal, no JVD.    Heart    Inspection: no deformities or lifts noted.      Palpation: normal PMI with no thrills palpable.      Auscultation: regular rate and rhythm, S1, S2 without murmurs, rubs,  gallops, or clicks.    Vascular    Abdominal Aorta: no palpable masses, pulsations, or audible bruits.      Femoral Pulses: normal femoral pulses bilaterally.      Pedal Pulses: normal pedal pulses bilaterally.      Radial Pulses: normal radial pulses bilaterally.      Peripheral Circulation: no clubbing, cyanosis, or edema noted with normal capillary refill.     EKG  Procedure date:  06/11/2010  Findings:      Sinus bradycardia, first degree AV block, left anterior fascicular block, lateral T wave inversions  Impression & Recommendations:  Problem # 1:  DYSPNEA ON EXERTION (ICD-786.09) I walked him around the office today and his oxygen level stayed in the mid 90s that he was breathing heavily. I will check a BNP level. I doubt systolic heart failure but could not exclude diastolic. More likely I think a primary pulmonary problem may be the culprit. At this point I do not suspect ischemia. I might however consider stress perfusion imaging in the future though it would have to be an exceptionally high risk findings to suggest cardiac catheterization when his renal insufficiency. Orders: TLB-BNP (B-Natriuretic Peptide) (83880-BNPR)  Problem # 2:  PREMATURE VENTRICULAR CONTRACTIONS (ICD-427.69) He has PVCs which are mildly symptomatic. No change in therapy is indicated. Orders: EKG w/ Interpretation (93000)  Problem # 3:  ESSENTIAL HYPERTENSION, BENIGN (ICD-401.1) His blood pressure is not at target. We are running out of therapeutic options given bradycardia renal insufficiency. For some reason he didn't tolerate a higher dose of Norvasc in the past. Therefore, I will need to at 10 mg hydralazine t.i.d. He will monitor his pressure at home. Orders: EKG w/ Interpretation (93000)  Patient Instructions: 1)  Your physician recommends that you schedule a follow-up appointment in: 1 month with Dr Antoine Poche 2)  Your physician has recommended you make the following change in your  medication:   Start Hydralazine 10 mg three times a day Prescriptions: HYDRALAZINE HCL 10 MG TABS (HYDRALAZINE HCL) one three times a day  #90 x 6   Entered by:   Charolotte Capuchin, RN   Authorized by:   [name], MD, [facility]   Signed by:   Charolotte Capuchin, RN on 06/11/2010   Method used:   Electronically to        [facility]  [phone]* (retail)       [address]       [place], [place]  [address]       Ph: [phone] or [phone]       Fax: [phone]   RxID:   [id number]  I have reviewed and approved all prescriptions at the time of this visit. [name], MD, [facility]  June 11, 2010 2:03 PM

## 2010-06-25 NOTE — Miscellaneous (Signed)
Summary: RX furosemide and KCL  Clinical Lists Changes  Medications: Added new medication of FUROSEMIDE 20 MG TABS (FUROSEMIDE) once every am - Signed Added new medication of KLOR-CON 10 10 MEQ CR-TABS (POTASSIUM CHLORIDE) once every am - Signed Rx of FUROSEMIDE 20 MG TABS (FUROSEMIDE) once every am;  #31 x 12;  Signed;  Entered by: Charolotte Capuchin, RN;  Authorized by: [name], MD, [facility];  Method used: Electronically to [facility]  [phone]*, [address] [place], [place], [place]  [address], Ph: [phone] or [phone], Fax: [phone] Rx of KLOR-CON 10 10 MEQ CR-TABS (POTASSIUM CHLORIDE) once every am;  #31 x 12;  Signed;  Entered by: Charolotte Capuchin, RN;  Authorized by: [name], MD, [facility];  Method used: Electronically to [facility]  [phone]*, [address], [place], [place], [place]  [address], Ph: [phone] or [phone], Fax: [phone]    Prescriptions: KLOR-CON 10 10 MEQ CR-TABS (POTASSIUM CHLORIDE) once every am  #31 x 12   Entered by:   Charolotte Capuchin, RN   Authorized by:   [name], MD, [facility]   Signed by:   Charolotte Capuchin, RN on 06/15/2010   Method used:   Electronically to        [facility]  [phone]* (retail)       [address]       [place], [place]  [address]       Ph: [phone] or [phone]       Fax: [phone]   RxID:   [id number] FUROSEMIDE 20 MG TABS (FUROSEMIDE) once every am  #31 x 12   Entered by:   Charolotte Capuchin, RN   Authorized by:   [name], MD, [facility]   Signed by:   Charolotte Capuchin, RN on 06/15/2010   Method used:   Electronically to        [facility]  [phone]* (retail)       [address]       [place], [place]  [address]       Ph: [phone] or [phone]       Fax: [phone]   RxID:   [id number]

## 2010-06-30 ENCOUNTER — Other Ambulatory Visit (INDEPENDENT_AMBULATORY_CARE_PROVIDER_SITE_OTHER): Payer: Medicare Other | Admitting: *Deleted

## 2010-06-30 DIAGNOSIS — I10 Essential (primary) hypertension: Secondary | ICD-10-CM

## 2010-06-30 DIAGNOSIS — R0600 Dyspnea, unspecified: Secondary | ICD-10-CM

## 2010-06-30 DIAGNOSIS — I5022 Chronic systolic (congestive) heart failure: Secondary | ICD-10-CM

## 2010-06-30 DIAGNOSIS — R0989 Other specified symptoms and signs involving the circulatory and respiratory systems: Secondary | ICD-10-CM

## 2010-06-30 DIAGNOSIS — R0609 Other forms of dyspnea: Secondary | ICD-10-CM

## 2010-06-30 DIAGNOSIS — Z79899 Other long term (current) drug therapy: Secondary | ICD-10-CM

## 2010-06-30 LAB — BRAIN NATRIURETIC PEPTIDE: Pro B Natriuretic peptide (BNP): 187.8 pg/mL — ABNORMAL HIGH (ref 0.0–100.0)

## 2010-07-01 LAB — BASIC METABOLIC PANEL
BUN: 40 mg/dL — ABNORMAL HIGH (ref 6–23)
Calcium: 9.4 mg/dL (ref 8.4–10.5)
Chloride: 108 mEq/L (ref 96–112)
Creatinine, Ser: 2.9 mg/dL — ABNORMAL HIGH (ref 0.4–1.5)
GFR: 21.63 mL/min — ABNORMAL LOW (ref >60.00)

## 2010-07-08 ENCOUNTER — Telehealth: Payer: Self-pay | Admitting: *Deleted

## 2010-07-08 NOTE — Telephone Encounter (Signed)
Message copied by Dessie Coma on Wed Jul 08, 2010  9:25 AM ------      Message from: [name]      Created: Fri Jul 03, 2010  4:34 PM       BNP better and BUN/creat about the same.  Repeat in one month.

## 2010-07-08 NOTE — Telephone Encounter (Signed)
T.C. To patient-notified per Dr. Antoine Poche BNP is better and BUN/Creat about the same.  Repeat labs in one month.  Patient states has a f/u appointment with Dr. Antoine Poche on the 26th.

## 2010-07-22 ENCOUNTER — Encounter: Payer: Self-pay | Admitting: *Deleted

## 2010-07-22 ENCOUNTER — Encounter: Payer: Self-pay | Admitting: Cardiology

## 2010-07-23 ENCOUNTER — Ambulatory Visit (INDEPENDENT_AMBULATORY_CARE_PROVIDER_SITE_OTHER): Payer: Medicare Other | Admitting: Cardiology

## 2010-07-23 ENCOUNTER — Encounter: Payer: Self-pay | Admitting: Cardiology

## 2010-07-23 DIAGNOSIS — I10 Essential (primary) hypertension: Secondary | ICD-10-CM

## 2010-07-23 DIAGNOSIS — R55 Syncope and collapse: Secondary | ICD-10-CM

## 2010-07-23 DIAGNOSIS — R0989 Other specified symptoms and signs involving the circulatory and respiratory systems: Secondary | ICD-10-CM

## 2010-07-23 DIAGNOSIS — I428 Other cardiomyopathies: Secondary | ICD-10-CM

## 2010-07-23 DIAGNOSIS — R0609 Other forms of dyspnea: Secondary | ICD-10-CM

## 2010-07-23 DIAGNOSIS — I2510 Atherosclerotic heart disease of native coronary artery without angina pectoris: Secondary | ICD-10-CM

## 2010-07-23 MED ORDER — CLONIDINE HCL 0.1 MG PO TABS: 0.1000 mg | ORAL_TABLET | Freq: Two times a day (BID) | ORAL | Status: DC

## 2010-07-23 NOTE — Patient Instructions (Signed)
You are being scheduled for a myoview stress test.  Please refer to the instruction sheet that was given to you. Decrease your Clonidine to 0.1 mg twice a day Continue all other medications as ordered

## 2010-07-23 NOTE — Assessment & Plan Note (Signed)
He did have dyspnea and a slightly elevated BNP. An echocardiogram done last year demonstrated no significant abnormalities. For now I will continue the low-dose Lasix though I might need to discontinue this he continues to have syncope or other symptoms.

## 2010-07-23 NOTE — Assessment & Plan Note (Signed)
He has a followup with Dr. Caryn Section. He was told in the past not to use aspirin but given his coronary disease I would like to use this and have instructed to start 81. I will refer list question to Dr. Caryn Section who apparently suggested he not take the aspirin in the past.

## 2010-07-23 NOTE — Assessment & Plan Note (Signed)
The etiology of this is not clear to me. It could be related to his bradycardia arrhythmia. His blood pressure is down now and It could have been related to this or vasovagal.  I will evaluate his coronaries as below. I will reduce his clonidine to 0.1 mg b.i.d. Further evaluation will be based on future symptoms or abnormalities of the stress test. He might need an event recorder as well or an extended Holter. I will review the Holter she recently wore.

## 2010-07-23 NOTE — Assessment & Plan Note (Signed)
I will order a stress perfusion study to evaluate his coronaries as he does have known coronary disease and some decreased exercise tolerance with dyspnea.

## 2010-07-23 NOTE — Assessment & Plan Note (Signed)
This could be related to the syncopal episode and I will evaluate as above.

## 2010-07-23 NOTE — Progress Notes (Signed)
HPI The patient presents for evaluation of syncope. This happened last night. He got up walked into his kitchen to get a drink of water. He felt clammy and apparently had a frank syncopal episode. This is similar to an episode he had several months ago. I did review her workup which has included an echocardiogram in May of last year demonstrating no significant valvular abnormalities. He did wear a Holter monitor demonstrating no pathologic arrhythmias though he had PVCs. He is describing some mild orthostasis. He is not noticing any palpitations. He has had no chest pressure, neck or arm discomfort. He has had no weight gain or edema. He says he does have a decreased appetite. He notices some dizziness occasionally looking up.  Allergies  Allergen Reactions  . Oxycodone Hcl   . Prednisone     Current Outpatient Prescriptions  Medication Sig Dispense Refill  . acetaminophen (TYLENOL) 325 MG tablet Take 650 mg by mouth as needed.        Marland Kitchen amLODipine (NORVASC) 5 MG tablet Take by mouth daily. 1 1/2 tab daily      . atorvastatin (LIPITOR) 10 MG tablet Take 10 mg by mouth daily.        . cloNIDine (CATAPRES) 0.2 MG tablet Take 0.2 mg by mouth 2 (two) times daily.        . furosemide (LASIX) 20 MG tablet Take 20 mg by mouth daily.        . hydrALAZINE (APRESOLINE) 10 MG tablet Take 10 mg by mouth 3 (three) times daily.        . potassium chloride (KLOR-CON) 10 MEQ CR tablet Take 10 mEq by mouth daily.        . Tamsulosin HCl (FLOMAX) 0.4 MG CAPS Take by mouth daily.         Past Medical History  Diagnosis Date  . CAD (coronary artery disease)     (status post stent and angioplasty of an obtuse margin in 1997.  Catheterization in 2006,  demonstrated that the left main was normal.  The LAD had proximal 50%  stenosis.  There was mid 60% stenosis.  There was 60-70% stenosis at the  mid diagonal.   . Renal insufficiency   . TIA (transient ischemic attack)   . HTN (hypertension)   . Hyperlipidemia       Past Surgical History  Procedure Date  . Tonsillectomy   . Adenoidectomy   . Nose surgery   . Total knee arthroplasty     ROS:  As stated in the HPI and negative for all other systems.  PHYSICAL EXAM BP 122/68  Pulse 47  Resp 18  Ht 6\' 2"  (1.88 m)  Wt 203 lb 1.9 oz (92.135 kg)  BMI 26.08 kg/m2 GENERAL:  Well appearing HEENT:  Pupils equal round and reactive, fundi not visualized, oral mucosa unremarkable NECK:  No jugular venous distention, waveform within normal limits, carotid upstroke brisk and symmetric, no bruits, no thyromegaly LYMPHATICS:  No cervical, inguinal adenopathy LUNGS:  Clear to auscultation bilaterally BACK:  No CVA tenderness CHEST:  Unremarkable HEART:  PMI not displaced or sustained,S1 and S2 within normal limits, no S3, no S4, no clicks, no rubs, no murmurs ABD:  Flat, positive bowel sounds normal in frequency in pitch, no bruits, no rebound, no guarding, no midline pulsatile mass, no hepatomegaly, no splenomegaly EXT:  2 plus pulses throughout, no edema, no cyanosis no clubbing SKIN:  No rashes no nodules NEURO:  Cranial nerves II through XII  grossly intact, motor grossly intact throughout PSYCH:  Cognitively intact, oriented to person place and time   EKG:  Sinus bradycardia, rate 47, first degree AV block, left axis deviation  ASSESSMENT AND PLAN

## 2010-08-03 ENCOUNTER — Ambulatory Visit (HOSPITAL_COMMUNITY): Payer: Medicare Other | Attending: Cardiology | Admitting: Radiology

## 2010-08-03 DIAGNOSIS — I498 Other specified cardiac arrhythmias: Secondary | ICD-10-CM

## 2010-08-03 DIAGNOSIS — I2510 Atherosclerotic heart disease of native coronary artery without angina pectoris: Secondary | ICD-10-CM | POA: Insufficient documentation

## 2010-08-03 DIAGNOSIS — R55 Syncope and collapse: Secondary | ICD-10-CM | POA: Insufficient documentation

## 2010-08-03 DIAGNOSIS — R0989 Other specified symptoms and signs involving the circulatory and respiratory systems: Secondary | ICD-10-CM

## 2010-08-03 MED ORDER — TECHNETIUM TC 99M TETROFOSMIN IV KIT
33.0000 | PACK | Freq: Once | INTRAVENOUS | Status: AC | PRN
  Administered 2010-08-03: 33 via INTRAVENOUS

## 2010-08-03 MED ORDER — TECHNETIUM TC 99M TETROFOSMIN IV KIT
11.0000 | PACK | Freq: Once | INTRAVENOUS | Status: AC | PRN
  Administered 2010-08-03: 11 via INTRAVENOUS

## 2010-08-03 MED ORDER — REGADENOSON 0.4 MG/5ML IV SOLN
0.4000 mg | Freq: Once | INTRAVENOUS | Status: AC
  Administered 2010-08-03: 0.4 mg via INTRAVENOUS

## 2010-08-03 NOTE — Progress Notes (Addendum)
[facility] SITE 3 NUCLEAR MED [address] [place] [address] [phone]  Cardiology Nuclear Med Study  Tracy [name] is a 77 y.o. male [id number] 04/25/1919   Nuclear Med Background Indication for Stress Test:  Evaluation for Ischemia, Stent Patency and PTCA Patency History: '97 Angioplasty x 3, 05/11 Echo EF 50% mild TR/MR, '06 Heart Catheterization EF 65% LAD 50% mild Diag 60% treated RX, Myocardial Infarction, 2010 Myocardial Perfusion Study EF 57% (-) ischemia and '97,'01 Stents OM,RCA Cardiac Risk Factors: Carotid Disease, History of Smoking, Hypertension, Lipids and TIA  Symptoms:  Dizziness, DOE and Syncope   Nuclear Pre-Procedure Caffeine/Decaff Intake:  None NPO After: 6:30pm   Lungs:  clear IV 0.9% NS with Angio Cath:  20g  IV Site: R Wrist  IV Started by:  Irean Hong, RN  Chest Size (in):  44 Cup Size: n/a  Height: 6\' 1"  (1.854 m)  Weight:  200 lb (90.719 kg)  BMI:  Body mass index is 26.39 kg/(m^2). Tech Comments:  n/a    Nuclear Med Study 1 or 2 day study: 1 day  Stress Test Type:  Treadmill/Lexiscan  Reading MD: [name], MD  Order Authorizing Provider:  J.Hochrein  Resting Radionuclide: Technetium 35m Tetrofosmin  Resting Radionuclide Dose: 11 mCi   Stress Radionuclide:  Technetium 64m Tetrofosmin  Stress Radionuclide Dose: 33 mCi           Stress Protocol Rest HR: 45 Stress HR: 77  Rest BP: 173/94 Stress BP: 187/74  Exercise Time (min): n/a METS: n/a   Predicted Max HR: 132 bpm % Max HR: 58.33 bpm Rate Pressure Product: 57846   Dose of Adenosine (mg):  n/a Dose of Lexiscan: 0.4 mg  Dose of Atropine (mg): n/a Dose of Dobutamine: n/a mcg/kg/min (at max HR)  Stress Test Technologist: Milana Na, EMT-P  Nuclear Technologist:  Domenic Polite, CNMT     Rest Procedure:  Myocardial perfusion imaging was performed at rest 45 minutes following the intravenous administration of Technetium 51m Tetrofosmin. Rest ECG: Sinus  Bradycardia, 1st degree AVB with T wave changes  Stress Procedure:  The patient received IV Lexiscan 0.4 mg over 15-seconds with concurrent low level exercise and then Technetium 20m Tetrofosmin was injected at 30-seconds while the patient continued walking one more minute.  There were no significant changes and occ pvcs/pacs with Lexiscan.  Quantitative spect images were obtained after a 45-minute delay. Stress ECG: No significant change from baseline ECG and Baseline abnormal ECG Baseline ECG LVH with lateral T wave changes  QPS Raw Data Images:  Normal; no motion artifact; normal heart/lung ratio. Stress Images:  Normal homogeneous uptake in all areas of the myocardium. Rest Images:  Normal homogeneous uptake in all areas of the myocardium. Subtraction (SDS):  Normal Transient Ischemic Dilatation (Normal <1.22):  1.05 Lung/Heart Ratio (Normal <0.45):  0.28  Quantitative Gated Spect Images QGS EDV:  145 ml QGS ESV:  53 ml QGS cine images:  NL LV Function; NL Wall Motion QGS EF: 64%  Impression Exercise Capacity:  Lexiscan with no exercise. BP Response:  Normal blood pressure response. Clinical Symptoms:  There is dyspnea. ECG Impression:  No significant ST segment change suggestive of ischemia. Comparison with Prior Nuclear Study: No images to compare  Overall Impression:  Normal stress nuclear study.   [name]   ADDENDUM:  No evidence of ischemia.  EF OK.  [name]

## 2010-08-04 NOTE — Progress Notes (Signed)
ROUTED TO DR.HOCHREIN.Falecha Clark °

## 2010-08-05 NOTE — Progress Notes (Signed)
Pt aware of results of nuclear study

## 2010-08-05 NOTE — Patient Instructions (Signed)
Pt aware of normal stress test and to continue current therapy.  Pt states he feels much better with the reduced dose of clonidine.

## 2010-08-11 NOTE — Assessment & Plan Note (Signed)
[facility]                            CARDIOLOGY OFFICE NOTE   NAME:Tracy [name], Tracy [name]                         MRN:          [id number]  DATE:09/07/2007                            DOB:          09-11-1919    The primary is Dr. Marjory Lies.   REASON FOR PRESENTATION:  Evaluate patient with coronary disease and  hypertension.   HISTORY OF PRESENT ILLNESS:  The patient is now 77 year old.  He looks  remarkably younger than his stated age.  Since I last saw him, he has  had left knee replacement.  He did very well with this.  He had no  cardiovascular problems apparently.  He has not had any chest pressure,  neck or arm discomfort.  He has had no shortness of breath.  He has had  some rare palpitations.  He is walking a half a mile a day.  He did have  postoperative anemia, but this has resolved.  He did develop a stress  ulcer with some symptoms related to this, but this has been treated and  resolved.  Somewhere along the course of his postoperative course and  recovery, he did have his Norvasc reduced.  Blood pressure is now  running slightly elevated as described below.   PAST MEDICAL HISTORY:  1. Coronary artery disease (status post stent and angioplasty of an      obtuse marginal in 1997.  Catheterization 2006 demonstrated left      main was normal, LAD had proximal 50% stenosis, there was mid 60%      stenosis, there was 60-70% stenosis at the mid diagonal, there was      diffuse 30-40% stenosis in the proximal mid segment, circumflex had      a large ramus intermediate with 50% stenosis, first obtuse marginal      was a small vessel with 80-90% stenosis.  The right coronary artery      was dominant.  There was a stent before the PDA.  I am not sure      when this was placed.  He had a 25-30% stenosis in this.  The PDA      ha d50% stenosis.  The EF was 65%).  2. Mild chronic renal insufficiency.  3. Mild carotid artery stenosis.  4. TIAs  5.  Hypertension.  6. Hyperlipidemia.  7. Tonsillectomy.  8. Adenoidectomy.  9. Nasal surgery.  10.Appendectomy.  11.Left orchiectomy.  12.Left knee replacement.   ALLERGIES:  PREDNISONE.   MEDICATIONS:  1. Vitamin C.  2. Multivitamin.  3. Norvasc 5 mg daily.  4. Lipitor 10 mg daily.  5. Clonidine 0.1 mg twice a day.  6. Aspirin 81 mg daily.   REVIEW OF SYSTEMS:  As stated in HPI and negative for other systems.   PHYSICAL EXAMINATION:  The patient is in no distress.  Blood pressure  170/86, heart rate 53 and regular, weight 203 pounds, body mass index  27.  HEENT:  Eyelids are unremarkable; pupils equal, round and reactive to  light; fundi not visualized; oral mucosa unremarkable.  NECK:  No jugular venous distention at 45 degrees; carotid upstroke  brisk and symmetric; no bruits; no thyromegaly.  LYMPHATICS:  No cervical, axillary, or inguinal adenopathy.  LUNGS:  Clear to auscultation bilaterally.  BACK:  No costovertebral angle tenderness.  CHEST:  Unremarkable.  HEART:  PMI not displaced or sustained; S1-S2 within normal limits; no  S3; no S4; no clicks; no rubs; murmurs.  ABDOMEN:  Flat, positive bowel sounds, normal in frequency and pitch, no  bruits, no rebound, no guarding, no midline pulsatile mass, no  organomegaly.  SKIN:  No rashes, no nodules.  EXTREMITIES:  2+ upper pulses, 2+ femorals, 2+ dorsalis pedis  bilaterally, no edema.  NEUROLOGICAL:  Oriented to person, place, and time; cranial nerves II-  XII grossly intact; motor grossly intact.   EKG:  Sinus rhythm, first-degree AV block, left axis deviation, left  anterior fascicular block, RSR prime V1-V2, lateral T-wave inversion  unchanged from previous EKGs.   ASSESSMENT/PLAN:  1. Coronary disease.  Patient is having no new symptoms.  No further      cardiovascular testing is suggested.  He will continue with risk      reduction.  2. Dyslipidemia.  Per Dr. Doristine Counter with a goal LDL less than 100 and       HDL greater than 40.  3. Hypertension.  His blood pressure is elevated.  I have taken the      liberty of increasing his Norvasc back to the previous 10 mg.  He      will have this followed by Dr. Doristine Counter going forward.  4. Palpitations.  These are rare, fleeting, and no further workup is      planned.  5. Carotid stenosis.  The patient is due to have carotid Doppler in      2010, and he will be reminded of this.  6. Follow-up.  I will see him back in 1 year or sooner if needed.     [name], MD, [facility]  Electronically Signed   JH/MedQ  DD: 09/07/2007  DT: 09/07/2007  Job #: [id number]   cc:   Marjory Lies, M.D.

## 2010-08-11 NOTE — Assessment & Plan Note (Signed)
[facility]                            CARDIOLOGY OFFICE NOTE   NAME:Tracy [name], Tracy [name]                         MRN:          [id number]  DATE:05/07/2008                            DOB:          September 22, 1919    PRIMARY CARE PHYSICIAN:  Marjory Lies, M.D.   REASON FOR PRESENTATION:  Evaluate the patient with dyspnea and coronary  artery disease.   HISTORY OF PRESENT ILLNESS:  The patient is an very pleasant 77 year old  gentleman who presents for evaluation of dyspnea.  He called to move up  his appointment.  He has a history of coronary artery disease as  described below.  He had been doing relatively well.  However, for about  4 months, he has had increasing shortness of breath.  This was quite  noticeable when he shoveled snow recently.  He said he was dyspneic  after 3 or 4 shovelful and had to go sit down for few minutes and then  start again.  He has noticed this when he has done some yard work or  walks a short incline.  This has been slowly progressive.  He does not  get chest pressure with this.  He said after coming into the house from  doing the snow shoveling, he thought his chest was Tracy [name] bit sore.  He  does not feel any palpitation, presyncope, or syncope.  He does have  some sporadic chest discomfort unassociated with the dyspnea.  This  feels like he has been punched.  It may be 5/10 in intensity.  It is in  his mid chest.  It does not radiate.  It does not come along with  nausea, vomiting, or diaphoresis.  This happens at rest and sporadically  and goes away on a tone after few minutes.  He has not had this before.   He has run out of the couple of his medicines recently.  His wife is in  the room and says he does not take his medicines as routinely as he  should.   PAST MEDICAL HISTORY:  Coronary artery disease (status post stent and  angioplasty of an obtuse margin in 1997.  Catheterization in 2006,  demonstrated that the left main  was normal.  The LAD had proximal 50%  stenosis.  There was mid 60% stenosis.  There was 60-70% stenosis at the  mid diagonal.  There was diffuse 30-40% stenosis in the proximal mid  segment.  The circumflex had a large ramus intermedius with 50%  stenosis.  The first obtuse margin was a small vessel with 80-90%  stenosis.  The right coronary artery was dominant.  There was a stent  for the PDA.  Not sure when this was placed.  He had a 25-30% stenosis  in this.  PDA had 50% stenosis.  The EF had 65% stenosis), mild chronic  renal insufficiency, mild carotid artery stenosis, TIAs, hypertension,  hyperlipidemia, tonsillectomy, adenoidectomy, nasal surgery,  appendectomy, left orchiectomy, left knee replacement.   ALLERGIES:  PREDNISONE.   MEDICATIONS:  1. Aspirin 81 mg daily.  2. Clonidine 0.1  mg q.a.m. and q.p.m.  3. Vitamin C.  4. Amlodipine 10 mg daily.  5. Lipitor 5 mg daily.   REVIEW OF SYSTEMS:  As stated in the HPI, and otherwise negative for  other systems.   PHYSICAL EXAMINATION:  GENERAL:  The patient is in no distress.  VITAL SIGNS:  Blood pressure 165/85, heart rate 50 and regular, weight  213 pounds.  HEENT:  Eyelids unremarkable.  Pupils equal, round, and reactive to  light.  Fundi not visualized.  Oral mucosa unremarkable.  NECK:  No jugular venous distention at 45 degrees.  Carotid upstroke  brisk and symmetrical.  No bruits.  No thyromegaly.  LYMPHATICS:  No cervical, axillary, or inguinal adenopathy.  LUNGS:  Clear to auscultation bilaterally.  BACK:  No costovertebral angle tenderness.  CHEST:  Unremarkable.  HEART:  PMI not displaced or sustained.  S1 and S2 are within normal  limits.  No S3.  No S4.  No clicks.  No rubs.  No murmurs.  ABDOMEN:  Flat, positive bowel sounds.  Normal in frequency and pitch.  No bruits.  No rebound.  No guarding.  No midline pulsatile mass.  No  hepatomegaly.  No splenomegaly.  SKIN:  No rashes.  No nodules.  EXTREMITIES:  2+  pulses , 2+ femorals, 2+ dorsalis pedis bilaterally.  No cyanosis.  No clubbing.  No edema.  NEURO:  Oriented to person, place, and time.  Cranial nerves II through  XII grossly intact.  Motor grossly intact.   EKG, sinus rhythm, rate 51, left axis deviation, left ventricular  hypertrophy by voltage criteria, repolarization changes unchanged from  previous, early transition in lead V2.   ASSESSMENT AND PLAN:  1. Dyspnea.  This is a new symptom for the patient.  I am quite      concerned about it.  He does have the coronary artery disease as      described.  I am going to start with the stress perfusion study to      see, if there is any significant change from previous.  If there      is, he will need cardiac catheterization.  Further evaluation will      be based on these results.  2. Coronary artery disease as above.  We will also continue a      secondary risk reduction.  3. Hypertension.  It is hard for me to know whether the meds he is      supposed to be taking are working to control his blood pressure as      he is run out.  I am going to renew the prescriptions and he is      going to keep the blood pressure dairy at home.  We may need to      adjust upward.  4. Dyslipidemia.  He can get a lipid profile when he comes back.  The      goal be an LDL less than 100 and HDL greater than 40.  5. Carotid stenosis.  He had some mild carotid stenosis and is due to      have his followed up again in October.  He is in our call back      list.  6. Followup.  I am going to see him back in no longer than 2 months,      but sooner if has any problems on the Cardiolite or further      symptoms.     Fayrene Fearing  Antoine Poche, MD, [facility]  Electronically Signed    JH/MedQ  DD: 05/07/2008  DT: 05/08/2008  Job #: [id number]   cc:   Marjory Lies, M.D.

## 2010-08-11 NOTE — Op Note (Signed)
NAME:  Tracy [name], Tracy [name] NO.:  [id number]   MEDICAL RECORD NO.:  [id number]          PATIENT TYPE:  INP   LOCATION:  5035                         FACILITY:  [facility]   PHYSICIAN:  Claude Manges. Whitfield, M.D.DATE OF BIRTH:  1919/09/10   DATE OF PROCEDURE:  05/18/2007  DATE OF DISCHARGE:                               OPERATIVE REPORT   PREOPERATIVE DIAGNOSIS:  End-stage osteoarthritis left knee.   POSTOPERATIVE DIAGNOSIS:  End-stage osteoarthritis left knee.   PROCEDURE:  Left total knee replacement surgery.   SURGEON:  Claude Manges. Cleophas Dunker, M.D.   ASSISTANT:  Arlys John D. Petrarca, P.A.-C.   ANESTHESIA:  General with supplemental femoral nerve block.   COMPLICATIONS:  None.   COMPONENTS:  DePuy LCS large femoral component and #5 keeled rotating  platform with a 10 mm polyethylene bearing, a three peg metal back  rotating patella, all was secured polymethylmethacrylate.   PROCEDURE IN DETAIL:  With the patient comfortable on the operating  table and under general orotracheal anesthesia with a supplemental  femoral nerve block tourniquet was applied to the left lower extremity.  The nursing staff inserted a Foley catheter with clear urine.   The left lower extremity was then prepped with Betadine scrub and  DuraPrep from the tourniquet to the midfoot.  Sterile draping was  performed, with extremity still elevated was Esmarch exsanguinated with  a proximal tourniquet at 350 mmHg.   A midline longitudinal incision was made centered at the patella  extending from the superior pouch to the tibial tubercle.  Via sharp  dissection the incision was carried down through subcutaneous tissue.  First layer of the capsule was incised in midline and a medial  parapatellar incision was then made with the Bovie.  The joint was  entered.  There was a clear yellow joint effusion of probably 20 mL.  There was a luminous synovitis.  Total synovectomy was performed.   The knee was then  flexed 90 degrees, the patella everted 180 degrees.  There were large areas of articular cartilage loss along the medial  femoral condyle and tibial plateau.  There were large areas of articular  cartilage loss on the patella.  The patient had a preoperative varus  position but it was not fixed.  I could easily correct it to neutral.  Preoperatively we had measured a large femoral component.  This was  confirmed intraoperatively.   Initial bony cut was made transversely on the proximal tibia with a 7  degree angle of declination.  Flexion/extension gaps were symmetrical at  10 mm.  MCL and LCL remained intact throughout the procedure.  Subsequent cuts were then made on the femur including a 4 degree distal  femoral valgus cut.  Again flexion and extension gap was symmetrical to  the flexion gap.  The final femoral cut was then made for tapering.  There was no notching.   Lamina spreader was inserted into the joint.  Medial and lateral menisci  resected as were ACL and PCL.  Osteophytes were resected from the  posterior femoral condyles.  Retractor was then placed  around the tibia.  We measured a #5 tibial tray.  This was applied.  Center hole made  followed by the keeled cut.  With the trial tibial tray in place 10 mm  bridging bearing was applied followed by the large trial femoral  component.  The knee was then reduced through a full range of motion and  there was no malrotation of the tibial tray and no opening with varus  and valgus stress.  We had full extension.   The patella was repaired by removing 12 mm of bone leaving 13 mm of  patella thickness.  The patella jig was then applied.  The trial patella  was inserted and through a full range of motion remained in the midline  without subluxation.   The trial components were removed.  The joint was copiously irrigated  with jet saline.  The knee was flexed to 90 degrees and retractors were  placed about the tibia.  The final  components were then impacted with  polymethylmethacrylate.  Initially we inserted the tibia followed by the  polyethylene liner, bridging bearing and then the femur.  Extraneous  methacrylate was removed from the periphery of each of the components.  In similar fashion the patella was applied with methacrylate with a  patellar clamp.   After complete maturation and hardening of the methacrylate the joint  was inspected.  Any extraneous methacrylate was removed with an  osteotome.  The joint was again irrigated with saline solution.  Tourniquet was deflated.  Gross bleeders were Bovie coagulated, bleeding  bone surfaces were covered with bone wax.   The deep capsule was closed indurated with interrupted #1 Ethibond.  Superficial capsule closed with a running zero Vicryl, subcu with 2-0  Vicryl, skin closed with skin clips.  Sterile bulky dressing was applied  followed by a patient support stocking.  The patient tolerated the  procedure without complications.      Claude Manges. Cleophas Dunker, M.D.  Electronically Signed     PWW/MEDQ  D:  05/18/2007  T:  05/19/2007  Job:  [id number]

## 2010-08-11 NOTE — Assessment & Plan Note (Signed)
[facility]                            CARDIOLOGY OFFICE NOTE   NAME:Tracy [name], Tracy [name]                         MRN:          [id number]  DATE:09/20/2006                            DOB:          01/04/1920    PRIMARY CARE PHYSICIAN:  Dr. Marjory Lies.   REASON FOR PRESENTATION:  Evaluate patient with coronary disease.   HISTORY OF PRESENT ILLNESS:  The patient is 77 years old.  He presents  for yearly followup.  Since I last saw him he has done well.  He does  yard work.  He says he occasionally gets some fleeting chest discomfort  but nothing above what he has had in the past, he has no increase in  these symptoms.  He has no chest pressure, neck or arm discomfort.  He  has had no presyncope or syncope though occasionally has some fluttering  palpitations.  He has had no new shortness of breath.  He occasionally  feels like he has to sigh.  However, he thinks his exercise capacity and  breathing ability are unchanged.  He has no PND or orthopnea.  He takes  his blood pressure at home.  He reports it to be in the 130s to 80s  although it periodically will be above that.  Since I last saw him he  was hospitalized for a neurologic workup with probable TIAs.  He has had  kidney function followed and has some mild renal insufficiency.  However, on review of this with Dr. Doristine Counter it has been stable, mildly  elevated creatinine.   PAST MEDICAL HISTORY:  1. Coronary artery disease.  Status post stent and angioplasty of an      obtuse marginal in 1997.  (Catheterization 2006 demonstrated the      left main was normal, the LAD has proximal 50% stenosis, there was      mid 60% stenosis, there was 60% - 70% stenosis after the mid-      diagonal, there was diffuse 30% - 40% stenosis in the proximal      midsegment, the circumflex had a large ramus intermediate with 50%      stenosis, first obtuse marginal was a small vessel with 80% - 90%      stenosis, the right  coronary artery is dominant.  There was a stent      before the PDA.  I am not sure when this was placed.  He had 25% -      30% stenosis in this.  The PDA had 50% stenosis.  The EF was 65%.)  2. Mild renal insufficiency.  3. Mild carotid artery stenosis.  4. TIAs.  5. Hypertension.  6. Hyperlipidemia.  7. Tonsillectomy.  8. Adenoidectomy.  9. Nasal surgery.  10.Appendectomy.  11.Left orchiectomy.   ALLERGIES:  PREDNISONE.   MEDICATIONS:  1. Clonidine 0.2 mg b.i.d.  2. Lipitor 20 mg daily.  3. Aspirin 81 mg daily.  4. Plavix 75 mg daily.  5. Norvasc 10 mg daily.   REVIEW OF SYSTEMS:  As stated in the HPI and otherwise negative  for  other systems.   PHYSICAL EXAMINATION:  The patient is in no distress.  Blood pressure  159/90, heart rate 51 and regular, weight 206 pounds.  HEENT:  Eyelids unremarkable, pupils equally round and reactive to  light, fundi not visualized, oral mucosa unremarkable.  NECK:  No jugular venous distention at 45 degrees, carotid upstroke  brisk and symmetrical, no bruits, no thyromegaly.  LYMPHATICS:  No cervical, axillary, inguinal adenopathy.  LUNGS:  Clear to auscultation bilaterally.  BACK:  No costovertebral angle tenderness.  CHEST:  Unremarkable.  HEART:  PMI not displaced or sustained, S1 and S2 within normal limits,  no S3, no S4, no clicks, no rubs, no murmurs.  ABDOMEN:  Flat, positive bowel sounds normal in frequency and pitch, no  bruits, no rebound, no guarding, no midline pulsatile mass, no  hepatomegaly, splenomegaly.  SKIN:  No rashes, no nodules.  EXTREMITIES:  With 2+ pulses upper, 2+ femorals and popliteal, 1+  dorsalis pedis and post tibialis bilaterally.  No cyanosis, no clubbing.  NEURO:  Grossly intact.   EKG sinus rhythm, left axis deviation, left anterior fascicular block,  left ventricular hypertrophy, lateral T wave inversions unchanged from  previous.   ASSESSMENT/PLAN:  1. Coronary disease.  The patient has  disease as described but is not      having any new symptoms.  He is to let me know if he has any change      in his symptoms that might prompt further evaluation.  He will      continue with secondary risk reduction.  2. Hypertension.  I am not satisfied with his blood pressure control.      I have called Dr. Doristine Counter today to discuss this.  The one thing I      would avoid is a beta blocker with his resting bradycardia.  I will      defer to Dr. Lennette Bihari whether he wants to go up on his Clonidine or      consider other agent.  3. Renal insufficiency.  He has had a chronically elevated creatinine      and this can be followed going forward.  4. Dyslipidemia per Dr. Doristine Counter with a goal LDL less than 100 and HDL      in the 40s.     [name], MD, [facility]  Electronically Signed   JH/MedQ  DD: 09/20/2006  DT: 09/20/2006  Job #: [id number]   cc:   Marjory Lies, M.D.

## 2010-08-14 NOTE — Discharge Summary (Signed)
[facility]  Patient:    Tracy [name], Tracy [name]                         MRN: [id number] Adm. Date:  [id number] Disc. Date: 02/21/00 Attending:  Learta Codding Dictator:   Lavella Hammock, P.A. CC:         Teena Irani. Arlyce Dice, M.D.  Veneda Melter, M.D. [facility]   Referring Physician Discharge [place]  DATE OF BIRTH:  2020/02/04  PROCEDURES: 1. Cardiac catheterization. 2. Coronary arteriogram. 3. Left ventriculogram. 4. Percutaneous transluminal coronary angioplasty x 2 and stent x 2.  HISTORY OF PRESENT ILLNESS:  Mr. Pieroni is a 77 year old male with a history of coronary artery disease, who had PTCA and stent to the OM in April of 1997. He was having chest pain and was seen in the office on February 19, 2000.  It was felt that he was having unstable anginal pain.  He was admitted from there to the hospital and scheduled for a cardiac catheterization.  HOSPITAL COURSE:  His initial enzymes were negative and he had a cardiac catheterization on February 19, 2000.  It showed a normal left main, an LAD with a long diffuse 60% stenosis, and a distal 80-90% stenosis.  The left circumflex had mild disease in that system, but no significant stenosis.  The RCA was dominant and had a proximal 70% stenosis, as well as an additional 70% stenosis and a 90% stenosis in a PD branch.  His EF was greater than 65%. He had a PTCA and stent to the mid RCA, reducing the stenosis from 70% to 10% and a distal stent to the RCA, reducing that stenosis from 70% to 10%.  He had a PTCA to the PD branch that reduced that stenosis from 90% to 30%.  He also had intravascular ultrasound of the RCA.  He tolerated the procedure well and the sheath was removed without difficulty.  He had six beats of ventricular tachycardia, but otherwise his rhythm was stable.  However, post procedure, he had a significant elevation in his cardiac enzymes with a CK-MB that peaked at 533/78.  His troponin I peaked at 27.3.   He had no further episodes of chest pain and over the next two days he continued to feel well.  He was ambulating without difficulty.  His enzymes were trending down after the initial elevation.  His medications were adjusted and he was started on aspirin, Plavix, and Toprol, as well as chlorthalidone and Altace.  He was started on Pravachol for hyperlipidemia as well.  By February 21, 2000, he was feeling well and considered stable for discharge.  LABORATORY VALUES:  Hemoglobin 11.4, hematocrit 32.0, WBC 7.1, platelets 201. Sodium 134, potassium 3.7, chloride 106, CO2 23, BUN 16, creatinine 1.5, glucose 127.  Total cholesterol 187, triglycerides 183, HDL 39, LDL 111.  DISCHARGE CONDITION:  Improved.  CONSULTS:  None.  COMPLICATIONS:  None.  DISCHARGE DIAGNOSES: 1. Coronary artery disease, status post percutaneous transluminal coronary    angioplasty and stent to the mid right coronary artery, distal right    coronary artery, and percutaneous transluminal coronary angioplasty to the    posterior descending branch of the right coronary artery with intravascular    ultrasound to the right coronary artery as well. 2. Post procedure myocardial infarction. 3. Preserved left ventricular function. 4. Hyperlipidemia. 5. Hypertension. 6. Percutaneous transluminal coronary angioplasty to the first obtuse marginal    in April of  1997. 7. Status post tonsillectomy, nasal surgery, appendectomy, left ear surgery,    and left orchiectomy.  DISCHARGE INSTRUCTIONS: 1. His activity level is to include no driving for a week and no sexual or    strenuous activity until seen by M.D. 2. He is to stick to a low-fat diet. 3. He is to call the office for bleeding, swelling, or drainage from the    catheterization site. 4. His is to get liver and cholesterol tests in six weeks. 5. He is to follow up with Teena Irani. Arlyce Dice, M.D., as needed. 6. He is to follow up with Veneda Melter, M.D., and call the  office for an M.D.    or P.A. appointment.  DISCHARGE MEDICATIONS: 1. Nitroglycerin 0.4 mg p.r.n. 2. Coated aspirin 325 mg q.d. 3. Plavix 75 mg q.d. for a month. 4. Chlorthalidone 25 mg one half tablet q.d. 5. Pravachol 40 mg q.d. 6. Toprol XL 25 mg q.d. 7. Altace 10 mg q.d. DD:  02/21/00 TD:  02/21/00 Job: [id number]

## 2010-08-14 NOTE — Procedures (Signed)
[facility]  Patient:    Tracy [name], Tracy [name] Visit Number: [id number] MRN: [id number]          Service Type: END Location: ENDO Attending Physician:  Nelda Marseille Dictated by:   Petra Kuba, M.D. Proc. Date: 06/06/01 Admit Date:  06/06/2001   CC:         Teena Irani. Arlyce Dice, M.D.   Procedure Report  PROCEDURE:  Colonoscopy with polypectomy.  INDICATION:  Patient with history of colon polyps, due for repeat screening. Consent was signed after risks, benefits, methods, and options thoroughly discussed in the past.  MEDICATIONS:  Demerol 80, Versed 8.  DESCRIPTION OF PROCEDURE:  Rectal inspection ass pertinent for external hemorrhoids, small.  Digital exam was negative.  The video colonoscope was inserted and fairly easily despite a long, looping colon, advanced to the cecum.  This did require rolling him on his back and various abdominal pressures.  On insertion, two tiny polyps were seen, both in the transverse, which were cold biopsy x 1.  The cecum was identified by the appendiceal orifice and the ileocecal valve.  An abnormal blood vessel was seen.  Looked more like an AVM before, and this was probably the same AVM, although it did n OT have that distinctive appearance at this time.  The scope was slowly withdrawn.  The prep was adequate.  There was some liquid stool that required washing and suctioning and some stool adherent to the wall of the colon which required washing.  On slow withdrawal through the colon, the polyp that was cold biopsied in the proximal level of the hepatic flexure was seen and hot biopsied x 1.  In the transverse, an additional three tiny polyps were seen and were all hot biopsied x 1.  In the mid transverse, a small sessile polyp was seen, snared, electrocautery applied.  The polyp was suctioned through the scope and collected in the trap.  We kept this in a different container.  The edge of the polyp possibly had some  residual polypoid tissue, and we went ahead and took two hot biopsies of the edge and put that in a second container.  The scope was further withdrawn.  The other polyp cold biopsied polypectomy site was seen in the distal transverse, and there was no obvious residual polyp, and we elected to continue to withdraw.  Two descending tiny polyps were seen and were hot biopsied as well.  Some left-sided diverticula were seen but no other abnormalities as we withdrew back to the rectum.  Once back in the rectum, the scope was retroflexed, pertinent for some internal hemorrhoids.  The scope was straightened, air was suctioned, and the scope removed.  The patient tolerated the procedure well.  There was no obvious immediate complications.  ENDOSCOPIC DIAGNOSES: 1. Small internal and external hemorrhoids. 2. Left-sided occasional diverticula. 3. Long, looping colon. 4. 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 5. Multiple tiny to small polyps in the descending, transverse to the hepatic    flexure.  Two cold biopsies, majority hot biopsied. 6. Mid transverse small sessile polyp, snare and hot biopsy of the edge, put    in container #2. 7. Otherwise, within normal limits to the cecum.  PLAN:  Await pathology to determine future colonic screening.  Happy to see back p.r.n.  Since he had some anorectal itching, we will try some hemorrhoidal with 2.5% hydrocortisone.  Ten day postpolypectomy instructions. Happy to see back  p.r.n., otherwise return care to Dr. Arlyce Dice for the customary health care maintenance to include yearly rectals and guaiacs. Dictated by:   Petra Kuba, M.D. Attending Physician:  Nelda Marseille DD:  06/06/01 TD:  06/06/01 Job: [id number]

## 2010-08-14 NOTE — H&P (Signed)
NAME:  ASCENSION, STFLEUR NO.:  [id number]   MEDICAL RECORD NO.:  [id number]          PATIENT TYPE:  INP   LOCATION:  1828                         FACILITY:  [facility]   PHYSICIAN:  Marlan Palau, M.D.  DATE OF BIRTH:  02/15/1920   DATE OF ADMISSION:  11/27/2005  DATE OF DISCHARGE:                                HISTORY & PHYSICAL   HISTORY OF PRESENT ILLNESS:  Tracy [name] is an 77 year old right-handed  white male born July 12, 1919, with a history of hypertension, coronary  artery disease status post stenting procedures.  This patient has recently  been admitted on August 31, 2007with onset of left hand tingling that went  up to involve the entirety of the left arm and left face with slurred  speech. The episode was transient previously but patient was admitted for  further evaluation as a code stroke. The patient did undergo a fairly  complete stroke work up.  The patient was found to have a small infarct in  the right temporal area. MI angiogram showed some atherosclerotic changes  most significant in the left M-1 segment, not the right, but diffuse  atherosclerotic changes were noted. The extracranial MI-angiogram was  relatively unremarkable. The patient did undergo a 2D echocardiogram that  shows an ejection fraction of 60-65%, no cardiac source of embolus was seen.  Carotid Doppler studies were also done; patient believes they were  unremarkable, I do not have the results of this study.  The patient was set  up for a transcranial Doppler study as an outpatient and was placed on  Plavix from aspirin. The patient was discharged about 2:45 p.m. and by the  time he arrived home at 3:15 p.m. today, the patient once again had a  similar episode involving the left hand in the fourth and fifth fingers  spreading to the whole hand, up the arm and involving the left face.  The  patient had slurred speech with the event and had clumsiness of the hand  because he could not  feel what was going on.  The patient had full  resolution of symptoms within 10 minutes. Did have a mild headache.  The  patient denies any visual field changes, gait disturbance. There was no  involvement of the leg.  The patient came back to the emergency room and is  being admitted for further observation.   PAST MEDICAL HISTORY:  1. Significant for history of transient ischemic attack involving the left      face and arm today, lasting 10 minutes duration.  2. Small right temporal stroke following a similar episode on November 26, 2005.  3. Hypertension.  4. Coronary artery disease status post stent.  5. History of left mastoid surgery for infection.  6. 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 The patient had a detached retina.   CURRENT MEDICATIONS:  1. Norvasc 5 mg daily.  2. Clonidine 0.2 mg b.i.d.  3. Plavix 75 mg daily.   ALLERGIES:  PREDNISONE.   HABITS:  The patient does not smoke, drinks alcohol on occasion.   SOCIAL HISTORY:  This patient is married, lives in the [place] area. The patient does work some.   FAMILY MEDICAL HISTORY:  Unknown, patient does not know his family.   REVIEW OF SYSTEMS:  Notable for no recent fevers, chills. Patient has a  slight headache and slight neck pain. Denies any shortness of breath, chest  pain, palpitations of the heart, denies nausea, vomiting, has some slight  queasiness. Has urinary frequency but no incontinence of bowel or bladder.  Has had no black out episodes or problems with dizziness.   PHYSICAL EXAMINATION:  VITAL SIGNS:  Blood pressure is 164/88, heart rate  60, respiratory rate 20, temperature afebrile.  GENERAL:  This patient is a well-developed, elderly white male who is alert  and cooperative at time of examination.  HEENT EXAM:  Head is atraumatic. Eyes - pupils are slightly anisocoric, left  pupil being 3 mm, right pupil being 4 mm with ptosis  noted on the left side.  Extraocular movements are full.  NECK:  Supple, no carotid bruits noted.  RESPIRATORY EXAMINATION:  Clear.  CARDIOVASCULAR EXAMINATION:  Reveals a regular rate and rhythm with no  obvious murmurs or rubs noted.  ABDOMEN:  Reveals positive bowel sounds, no organomegaly or tenderness  noted.  EXTREMITIES:  Are without significant edema.  NEUROLOGIC EXAMINATION:  Cranial nerves as above. Facial symmetry is present  other than the left-sided ptosis. The patient has full extraocular  movements, visual fields are full. Speech is well enunciated, not aphasic.  Motor testing reveals 5/5 strength in all four extremities with good  symmetric motor tone noted throughout.  Sensory testing reveals slight  decrease in pin prick sensation on the left face as compared to the right.  The patient has otherwise good full sensation on all four extremities.  Vibratory sensation is symmetric throughout.  The patient has good finger-to-  nose, finger and heel-skin, no drift is seen. Deep tendon reflexes are  symmetric, normal. Toes neutral bilaterally. Gait was not tested.   LABORATORY VALUES:  Are pending at this time.  CT of the head was  unremarkable.   IMPRESSION:  1. History of transient ischemic attack involving the left arm and face.  2. Small right temporal stroke.  3. Coronary artery disease.  4. Hypertension.   The patient has had a recurring identical TIA today. This suggests an  intrinsic medium or small blood vessel problem intracranially rather than a  cardiogenic embolus. The patient will need to be continued on an  antiplatelet agent but for now will get this patient in the hospital for IV  fluid hydration, observe this patient, if episodes continue to occur, may  consider IV heparin therapy.  At this point in time will add aspirin back to  the Plavix overlapping the two by 1 week and then discontinue aspirin. Will pursue the transcranial Doppler study. Will  follow patient's clinical course  while inhouse.     Marlan Palau, M.D.  Electronically Signed    CKW/MEDQ  D:  11/27/2005  T:  11/27/2005  Job:  [id number]   cc:   [facility]

## 2010-08-14 NOTE — Discharge Summary (Signed)
NAME:  RAYMUND, MANRIQUE NO.:  [id number]   MEDICAL RECORD NO.:  [id number]          PATIENT TYPE:  INP   LOCATION:  5035                         FACILITY:  [facility]   PHYSICIAN:  Claude Manges. Whitfield, M.D.DATE OF BIRTH:  Sep 13, 1919   DATE OF ADMISSION:  05/18/2007  DATE OF DISCHARGE:  05/22/2007                               DISCHARGE SUMMARY   ADMISSION DIAGNOSIS:  Osteoarthritis, left knee.   DISCHARGE DIAGNOSES:  1. Osteoarthritis, left knee.  2. History of hypertension.  3. Coronary artery disease with stents.  4. Post hemorrhagic anemia.  5. Chronic renal disease.   PROCEDURE:  Was a left total knee arthroplasty.   HISTORY:  An 77 year old white male with chronic left knee pain now  requiring narcotics to sleep and to do activities of daily living.  He  now has severe pain which is constant.  Does have swelling and effusions  noted.  He must use a cane for ambulation.  He has failed conservative  treatment.  He is for left total knee arthroplasty.   HOSPITAL COURSE:  An 77 year old white male admitted May 18, 2007  after appropriate laboratory studies were obtained as well 2 g Ancef IV  on-call to the operating room.  He was taken to the operating room where  he underwent a left total knee arthroplasty.  He tolerated the procedure  well.  He was continued on Ancef 1 g IV q.6h. for 3 doses.  Lovenox 30  mg subcutaneous q.12h. was begun until his Coumadin became therapeutic.  Foley was placed intraoperatively.  Consults for PT, OT, and care  management were made.  Ambulation, partial weightbearing 50%.  CPM 0 to  60 degrees for 6 to 8 hours per day increasing 5 to 10 degrees per day.  He was allowed out of bed to chair the following day.  His Percocet was  discontinued, and he was placed on Vicodin 1 to 2 tablets every 4 hours  as needed for pain.  He was weaned off of his PCA as well as O2.  Remainder of his hospital course was uneventful.  He was  discharged on  the 23rd to return back to the office in follow-up.   EKG revealed normal sinus rhythm with nonspecific T-wave abnormality.  This was on May 16, 2007.   RADIOGRAPHIC STUDIES:  May 16, 2003:  Chest x-ray revealed no acute  cardiopulmonary findings.  No change since prior study of November 26, 2005.  Preoperative hemoglobin 12.9, hematocrit 38%, white count 4095,  platelets 299,000.  Discharge hemoglobin 8.6, hematocrit 25.1%, white  count 4900, platelets 191,000.  Pro time preoperatively 13.9, INR 1.1,  PTT 31.  Discharge PT 20.7, INR 1.7.  Preoperative sodium 136, potassium  4.9, chloride 106, CO2 25, glucose 109, BUN 36, creatinine 2.06.  GFR  was 31.  Discharge sodium 139, potassium 4.4, chloride 108, CO2 24,  glucose 119, BUN 20, creatinine 1.93 with a GFR of 33.  Blood type was O  negative, antibody screen negative.   DISCHARGE INSTRUCTIONS:  Discharged on a low-sodium, heart health diet.  Follow instruction sheet and change dressing daily.  Increase activity  slowly.  Ambulate 50% weightbearing as taught by PT.  He will use hi  walker as instructed.  He may shower on Monday.  No lifting or driving  for 6 weeks.  Prescription for Coumadin 5  mg as directed by Genevieve Norlander pharmacist.  Awanda Mink 5/325 one to two tablets  every 4 hours as needed for pain.  Robaxin 500 mg 1 tablet every 6 hours  as needed for spasms.  He will have [place] for his home health and  follow back up in the office round on May 31, 2007 with Dr. Cleophas Dunker  for postoperative care.  Discharged in improved condition.      Oris Drone Petrarca, P.A.-C.      Claude Manges. Cleophas Dunker, M.D.  Electronically Signed    BDP/MEDQ  D:  06/13/2007  T:  06/13/2007  Job:  [id number]

## 2010-08-14 NOTE — Cardiovascular Report (Signed)
NAME:  Tracy [name], Tracy [name] NO.:  [id number]   MEDICAL RECORD NO.:  [id number]          PATIENT TYPE:  AMB   LOCATION:                               FACILITY:  [facility]   PHYSICIAN:  [name], M.D.   DATE OF BIRTH:  [birth year]   DATE OF PROCEDURE:  12/18/2004  DATE OF DISCHARGE:                              CARDIAC CATHETERIZATION   PRIMARY CARE PHYSICIAN:  Teena Irani. Arlyce Dice, M.D.   CARDIOLOGIST:  [name], M.D.   PROCEDURE:  Left heart catheterization/ coronary arteriography.   INDICATIONS:  A patient with chest pain.  Cardiolite suggested possible left  ventricular dilatation.  There was no high-grade area of ischemia in the  anterior wall or apex.  He did have some questionable inferior wall scaring  with ischemia. His overall ejection fraction was felt to be about 53%.  The  patient had previous angioplasty of marginal vessel and previous stenting of  a right coronary.   PROCEDURAL NOTE:  Left heart catheterization was performed via the right  femoral artery. The artery was cannulated using an anterior wall puncture. A  #4-French arterial sheath was inserted via the modified Seldinger technique.  Preformed Judkins and a pigtail catheter were utilized.  The patient  tolerated the procedure and left the cath lab in stable condition.   RESULTS   HEMODYNAMICS:  LV 169/17, AO 169/76   CORONARIES:  1.  The left main was normal.  2.  The LAD had a proximal 50% stenosis.  There was mid 60% stenosis.  There      is a 60-70% lesion after a mid diagonal.  There were diffuse 30-40%      lesions throughout the proximal and midsegment.  This was a calcified      vessel that wrapped the apex.  The diagonals were small and free of high-      grade disease.  3.  The circumflex was a small vessel in the AV groove.  There was a large      ramus intermedius.  It had a long, mid 50% stenosis.  There was a first      obtuse marginal which was a narrow caliber vessel of  1-mm.  It had an 80-      90% midstenosis.  4.  The right coronary artery was a very large vessel.  It was dominant.      There was a proximal stent and a distal stent before the PDA.  There      were diffuse InStent luminal irregularities.  There were diffuse 25-30%      lesions throughout this vessel.  The PDA was small with diffuse 50%      stenosis, posterolateral x3 had moderate diffuse disease.   LEFT VENTRICULOGRAM:  The left ventriculogram was obtained in the RAO  projection. The EF was 65%.   CONCLUSION:  Small vessel high-grade disease, diffuse nonobstructive large  vessel disease.  The LAD is the most significant appearing visually, but  there was no evidence of ischemia or infarct in the apex to go along with  the tight stenosis.  Symptoms are somewhat atypical.   PLAN:  At this point, the patient needs very aggressive medical management.  He has been somewhat noncompliant with meds, but after reviewing this he  might be in the future.  He knows with his vasculature he is at risk for  future cardiovascular events.  He will follow closed with Dr. Arlyce Dice for his  dyslipidemia.  I would suggest an LDL in the 70s and HDL in the 40s as a  goal.  I will see if he can tolerate some Imdur.  He needs his blood  pressure well controlled and he is now back on some of his medications.  He  is agreeing to take these.   Of note, I did try to call Dr. Arlyce Dice to discuss this, but he was not in the  office.  I will send him a copy of this note.           ______________________________  [name], M.D.     JH/MEDQ  D:  12/18/2004  T:  12/19/2004  Job:  [id number]   cc:   Teena Irani. Arlyce Dice, M.D.  Fax: [phone]

## 2010-08-14 NOTE — H&P (Signed)
NAME:  Tracy [name], Tracy [name] NO.:  [id number]   MEDICAL RECORD NO.:  [id number]          PATIENT TYPE:  EMS   LOCATION:  MAJO                         FACILITY:  [facility]   PHYSICIAN:  Michael L. Reynolds, M.D.DATE OF BIRTH:  23-Feb-1920   DATE OF ADMISSION:  11/26/2005  DATE OF DISCHARGE:                                HISTORY & PHYSICAL   CHIEF COMPLAINT:  Code stroke.   HISTORY OF PRESENT ILLNESS:  This is the initial inpatient stroke service  admission for this 77 year old man with a past medical history which  includes hypertension and coronary artery disease with stenting in the past.  The patient was at work this morning when he noticed some numb sensation  involving the left arm, especially the third, fourth and fifth digits, which  gradually ascended up his arm.  He thought this might be due to the fact  that his watch was too tight and he took off his watch, but the sensation  continued to ascend up his arm and into his face.  At that point his wife  noticed that his speech was slurred and his face was a little bit unequal.  He also had a little bit of unsteadiness when he walked.  His wife brought  him to the [facility] Emergency Room where a code stroke  was called.  Since he has improved, almost to a resolution in the emergency  department.  He now has no complaints except that he feels that his speech  is a little bit thick.  He says that he has had similar episodes involving  his arm two or three times before in the past, but they have never gotten up  into his face, and this did seem a little bit more intense than other  previous symptoms.  He has never had a known stroke, to his knowledge.   PAST MEDICAL HISTORY:  1. Hypertension, which he has had for much of his life.  He denies a      history of diabetes.  2. He has a history of coronary artery disease with stenting in 1993, and      again in 2001.   FAMILY HISTORY:  Unknown.  The  patient does not know his family.   SOCIAL HISTORY:  He lives with his wife.  He is normally independent in his  activities of daily living and he still works.  He occasionally consumes  beer and Scotch, but denies does not smoke.   ALLERGIES:  No known drug allergies.   MEDICATIONS:  1. Aspirin daily.  2. Clonidine 0.2 mg b.i.d.   REVIEW OF SYSTEMS:  He does report occasional intermittent chest pain, for  which he has been followed by Dr. [name].  He also reports a long  history of osteoarthritis in his neck with a ratcheting sensation  sometimes when he turns his head, but no radicular pain.  Otherwise a full  10-point review of systems is negative except as outlined in the HPI and in  the admission nursing records.   PHYSICAL EXAMINATION:  VITAL SIGNS:  Temperature 96.4 degrees, blood  pressure 174/92, pulse 51, respirations 22.  GENERAL:  This is a healthy-appearing man, supine in the hospital bed, in no  evident distress.  HEENT:  Normocephalic and atraumatic.  Oropharynx benign.  NECK:  Supple without carotid or supraclavicular bruits.  HEART:  Bradycardic.  A regular rhythm.  No murmurs.  CHEST:  Clear to auscultation bilaterally.  ABDOMEN:  Soft, normoactive bowel sounds.  EXTREMITIES:  No edema, with 2+ pulses.  NEUROLOGIC:  Mental status:  He is awake, alert, fully oriented.  Recent and  remote memory are intact.  He is able to name objects and repeat phrases.  His speech is fluent and minimally dysarthric.  Cranial nerves:  Pupils equal and reactive.  Extraocular movements full  without nystagmus.  Visual fields are full to confrontation.  Hearing is  intact to conversational speech.  The facial sensation is diminished on the  right, compared to the left.  He seems to have a very slight left facial  droop.  Tongue and palate move normally and symmetrically.  Motor:  Normal  bulk and tone.  Normal strength on testing surrounding muscles.  Sensation:  Diminished  to pinprick in the left upper extremity, otherwise intact  throughout.  Coordination:  Rapid movements performed well.  Finger-to-nose  is performed well.  Gait:  He arises easily from a chair and his stance is  normal.  He is able to ambulate without difficulty.  Reflexes 2+ and  symmetric.  Toes are downgoing bilaterally.   LABORATORY DATA:  CBC:  White count 4, hemoglobin 11.8, platelets 172,000  with normal differential.  Chemistries remarkable for an elevated BUN and  creatinine of 27 and 2 respectively.  Glucose slightly elevated at 119.  Liver function tests normal.  Coags are normal.  Cardiac enzymes are  negative.   A CT of the head demonstrates extensive white matter disease, but no acute  finding.   IMPRESSION:  1. Right brain subcortical cerebrovascular event, transient ischemic      attack versus cerebrovascular accident.  2. Hypertension.  3. Renal insufficiency.  4. Hyperglycemia, of uncertain significance.   PLAN:  Will admit to the hospital for routine stroke workup.  Will have to  change his medications a little bit, depending upon the results of that. He  will likely need another antihypertensive, a stronger anti-platelet agent.  Stroke service to follow.      Michael L. Thad Ranger, M.D.  Electronically Signed     MLR/MEDQ  D:  11/26/2005  T:  11/26/2005  Job:  [id number]   cc:   Teena Irani. Arlyce Dice, M.D.  [name], MD, [facility]

## 2010-08-14 NOTE — Discharge Summary (Signed)
NAME:  Tracy [name], Tracy [name] NO.:  [id number]   MEDICAL RECORD NO.:  [id number]          PATIENT TYPE:  INP   LOCATION:  3033                         FACILITY:  [facility]   PHYSICIAN:  Casimiro Needle L. Reynolds, M.D.DATE OF BIRTH:  [birth year]   DATE OF ADMISSION:  11/27/2005  DATE OF DISCHARGE:                                 DISCHARGE SUMMARY   ADMISSION DIAGNOSES:  1. Left brain numbness dissected right brain subcortical transient      ischemic attack versus stroke.  2. Hypertension.  3. History of coronary artery disease.   DISCHARGE DIAGNOSES:  1. Right subcortical posterior parietal stroke with minimal left      hemisensory changes.  2. Hypertension.  3. History of coronary artery disease.  4. Hyperhomocysteinemia.   CONDITION AT DISCHARGE:  Improved.   DIET:  Low salt, low fat, low cholesterol.   ACTIVITY:  Ad lib.   MEDICATIONS AT DISCHARGE:  1. Clonidine 0.2 mg p.o. b.i.d.  2. Norvasc 5 mg p.o. daily (new).  3. Plavix 75 mg p.o. daily (new).  4. Multivitamin daily (new).   STUDIES:  1. CT of the head performed November 26, 2005 demonstrating fairly severe      subcortical white matter disease without acute finding.  2. MRI of the brain performed November 26, 2005 without contrast      demonstrating a small acute infarct in the posterior right temporal      lobe and moderate small-vessel disease changes with mild atrophy.  3. MR angiogram of the intracranial circulation November 26, 2005      demonstrating a diffuse arthrosclerotic disease most prominent in the      left M1 segment.  4. MR angiogram of the extracranial neck vessels demonstrating a vestigial      right vertebral artery otherwise no significant narrowing, note this      was done without contrast.  5. 2-D echocardiogram performed November 26, 2005 results unavailable at      discharge.  6. Carotid Doppler performed November 26, 2005 results unavailable at      discharge.   LABORATORY REVIEW:  A CBC  unremarkable.  CMP remarkable for an elevated BUN  and creatinine at 27 and 2.0 respectively, blood glucose of 119.  LFTs  normal.  Coags normal.  Cardiac enzymes negative.  Hemoglobin A1c normal at  5.7.  A lipid panel demonstrates total cholesterol 197, LDL 39, HDL 113,  homocystine level is elevated at 17.5.   HOSPITAL COURSE:  Please see admission H&P for full admission details.   BRIEF PRESENTATION:  This is an 77 year old man, who presented to the  emergency room with acute onset of left hemisensory changes and dysarthria  on the morning of admission which was clearing by the time he arrived in the  emergency room.  He was admitted for further workup and evaluation of his  presumed cerebrovascular event.  He underwent a routine stroke workup as  above with the results as outlined above.  He was placed on telemetry  monitoring and was noted to have stereo bradycardia into the mid  40s which  was never symptomatic.  His symptoms improved further through his  hospitalization and at the time of discharge he had no symptoms at which he  was aware and demonstrated only some mild pin prick sensation loss in his  left fingertips on exam.  He was cleared by speech therapy during his stay  and at discharge he was eating a diet of normal consistencies.  He did  undergo a 2-D echocardiogram and carotid Doppler in the hospital the results  of which were not known at the time of discharge, these will be followed up  as an outpatient.  It was determined on the basis of his workup that his  most fruitful future stroke prevention strategies would be more aggressive  with antiplatelet therapy and better treatment of his hypertension.  On that  basis, his aspirin was changed to Plavix and he was advised to add Norvasc 5  mg daily to his clonidine.  He was asked to call [facility] to setup an outpatient transcranial Doppler study and a followup  with Dr. Kelli Hope in 6 weeks.   He is given a routine stroke  education by the neuro nurses on 3000 prior to discharge.   FOLLOWUP:  He is advised to follow up with his primary physician, Dr. Dara Hoyer in 1-2 weeks and to follow up at [facility] with  Dr. Kelli Hope in 6 weeks.  He will follow up with his cardiologist,  Dr. Antoine Poche as scheduled.      Michael L. Thad Ranger, M.D.  Electronically Signed     MLR/MEDQ  D:  11/27/2005  T:  11/27/2005  Job:  [id number]   cc:   Teena Irani. Arlyce Dice, M.D.  [name], MD, [facility]

## 2010-08-14 NOTE — Op Note (Signed)
NAME:  Tracy [name], Tracy [name]                  ACCOUNT NO.:  [id number]   MEDICAL RECORD NO.:  [id number]          PATIENT TYPE:  AMB   LOCATION:  ENDO                         FACILITY:  [facility]   PHYSICIAN:  Petra Kuba, M.D.    DATE OF BIRTH:  November 21, 1919   DATE OF PROCEDURE:  07/23/2004  DATE OF DISCHARGE:                                 OPERATIVE REPORT   PROCEDURES:  Colonoscopy with biopsy.   INDICATION:  History of colon polyps.  Due for repeat screening.  Consent  was signed after risks, benefits, methods and options were thoroughly  discussed in the office on multiple occasions.   MEDICINES USED:  1.  Demerol 50 mg.  2.  Versed 5 mg.   DESCRIPTION OF PROCEDURE:  Rectal inspection was pertinent for external  hemorrhoids, small.  Digital exam was negative.  Video colonoscope was  inserted and easily advanced around the colon to the cecum.  This did  require rolling him on his back and some abdominal pressure.  Other than  left greater than right rare diverticula, no abnormality was seen on  insertion.  The cecum was identified by the appendiceal orifice and  ileocecal valve.  Prep was adequate.  There was some liquid stool that  required washing and suctioning.  On slow withdrawal through the colon, the  cecum and the ascending were normal, except for an occasional diverticula.  The scope was withdrawn around the transverse colon.  A mid transverse  moderate lipoma was seen, cold biopsied x 4 and put in the first container.  The scope was further withdrawn.  In the distal transverse, a tiny polyp was  seen and was cold biopsied x 2 and put in separate container.  The scope was  further withdrawn.  Other than the left-sided diverticula, no other  abnormalities were seen as we slowly withdrew back to the rectum.  Anorectal  pull through and retroflexion confirmed the hemorrhoids.  The scope was  straightened and readvanced a short ways up the left side of the colon.  Air  was suctioned.   Scope removed.  The patient tolerated the procedure well.  There was no obvious immediate complication.   ENDOSCOPIC DIAGNOSES:  1.  Internal and hemorrhoids.  2.  Left greater than right rare diverticula.  3.  Mid transverse probable lipoma, status post cold biopsy.  4.  Distal transverse tiny polyp, cold biopsied.  5.  Otherwise within normal limits to the cecum.   PLAN:  Await pathology.  Will probably recheck colon screening in five years  if doing well medically.  Otherwise return care to the [facility] doctors for the customary health care maintenance to include yearly  rectals and guaiacs.  I will be on standby to help p.r.n.      MEM/MEDQ  D:  07/23/2004  T:  07/23/2004  Job:  [id number]   cc:   [facility]

## 2010-08-14 NOTE — Discharge Summary (Signed)
NAME:  Tracy [name], STOREY                  ACCOUNT NO.:  [id number]   MEDICAL RECORD NO.:  [id number]          PATIENT TYPE:  INP   LOCATION:  3023                         FACILITY:  [facility]   PHYSICIAN:  Pramod P. Pearlean Brownie, MD    DATE OF BIRTH:  1919/05/19   DATE OF ADMISSION:  11/27/2005  DATE OF DISCHARGE:  11/30/2005                                 DISCHARGE SUMMARY   ADMISSION DIAGNOSIS:  Left body pulsations.   DISCHARGE DIAGNOSES:  1. Right posterior cerebral subcortical infarct secondary to small vessel      disease.  2. Hypertension.  3. Coronary artery disease.  4. Homocystinemia.   HOSPITAL COURSE:  Kindly see Dr. Kelli Hope' dictated history and H&P  on November 27, 2005 for previous details.  In brief, Mr. Lamorte was an 77-  year-old Caucasian male who was admitted with symptoms of transient left  body upper extremity progressive paresthesia, slurred speech and clumsiness,  which resolved after he came to the emergency room.  He was admitted on  November 27, 2005 and had __________ stroke workup including MRI scan of  brain, which demonstrated small acute infarct in the right posterior  parietal white matter.  MR angiogram showed diffuse mild atherosclerotic  changes.  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  Echocardiogram showed a normal ejection  fraction.  Carotid artery exam showed no significant stenoses.  The patient  had been on aspirin and Plavix and discharged on that day although as soon  as he reached home he noticed a recurrence of symptoms and so he came back  to [facility] and was admitted.  This time Dr. Letta Kocher saw him and he  discontinued the Plavix and put him on Aggrenox.  The patient was observed  for 2 days in the hospital, remained stable without recurrence of his  symptoms.  He was advised to take aspirin 81 mg a day for 2 weeks and to  stop it once he reached 2 Aggrenox a day.  He was advised  to start Aggrenox  1 capsule a day for the first 2 weeks, if tolerating increase to twice a  day.  He was advised to take Tylenol in case he got headaches with that.  No  other additional testing was done.  He remained stable.   MEDICATIONS AT DISCHARGE:  1. Clonidine 0.2 mg twice a day.  2. Norvasc 5 mg daily.  3. Aggrenox 1 capsule daily for 2 weeks, to be increased to twice a day.  4. Multivitamin once a day.  5. Tylenol p.r.n. headaches.   The patient was advised to follow up with Dr. Thad Ranger in the clinic in 6  weeks and with primary care physician Dr. Dara Hoyer in 1-2 weeks, and  with his cardiologist as needed.           ______________________________  Sunny Schlein. Pearlean Brownie, MD     PPS/MEDQ  D:  11/30/2005  T:  11/30/2005  Job:  [id number]   cc:   Teena Irani. Arlyce Dice, M.D.

## 2010-08-14 NOTE — Op Note (Signed)
NAME:  Tracy [name], Tracy [name]                  ACCOUNT NO.:  [id number]   MEDICAL RECORD NO.:  [id number]          PATIENT TYPE:  AMB   LOCATION:  DSC                          FACILITY:  [facility]   PHYSICIAN:  Cindee Salt, M.D.       DATE OF BIRTH:  [birth year]   DATE OF PROCEDURE:  06/30/2006  DATE OF DISCHARGE:                               OPERATIVE REPORT   PREOPERATIVE DIAGNOSIS:  Stenosing tenosynovitis, left middle finger.   POSTOPERATIVE DIAGNOSIS:  Stenosing tenosynovitis, left middle finger.   OPERATION:  Release of A1 pulley, left middle finger.   SURGEON:  Cindee Salt, M.D.   ASSISTANT:  Alfredo Bach R.N.   ANESTHESIA:  Forearm-based IV regional.   HISTORY:  The patient is an 77 year old male with a history of  triggering of his left middle finger, not responsive to conservative  treatment.  He is desirous of proceeding to have a release performed.  He is aware of risks and complications including infection, recurrence,  injury to arteries, nerves or tendons, incomplete relief of symptoms and  dystrophy.  He has elected to proceed to have this done.  In the  preoperative area, questions were encouraged and answered and the  extremity marked by both patient and surgeon, antibiotic given.   PROCEDURE:  The patient was brought to the operating room where a  forearm-based IV regional anesthetic was carried out without difficulty.  He was prepped using DuraPrep, supine position, left arm free.  An  oblique incision was made over the A1 pulley of the left middle finger  and carried down through subcutaneous tissue.  Bleeders were  electrocauterized.  Neurovascular bundles were identified and protected,  retractors placed.  The A1 pulley was then identified.  A adhesive  tenosynovitis was apparent; this was debrided after release of the A1  pulley on the radial aspect.  A small incision was made in the central  aspect of the A2 pulley, finger placed through a full range motion and  no  further triggering was evident.  The wound was irrigated and the skin  closed with interrupted 5-0 nylon sutures.  A sterile compressive  dressing to the hand was applied.  The patient tolerated the procedure  well and was taken to the recovery for observation in satisfactory  condition.   He is discharged home to return to the [facility] in 1  week on Vicodin.           ______________________________  Cindee Salt, M.D.     GK/MEDQ  D:  06/30/2006  T:  06/30/2006  Job:  [id number]   cc:   Teena Irani. Arlyce Dice, M.D.

## 2010-08-14 NOTE — Cardiovascular Report (Signed)
[place]  Patient:    Tracy [name], Tracy [name]                         MRN: [id number] Proc. Date: 02/19/00 Adm. Date:  [id number] Attending:  Learta Codding CC:         Teena Irani. Arlyce Dice, M.D.   Cardiac Catheterization  PROCEDURES PERFORMED: 1. Left heart catheterization. 2. Left ventriculogram. 3. Selective coronary angiography. 4. Percutaneous transluminal coronary angioplasty and stenting of the    right coronary artery. 5. Intravascular ultrasound of right coronary artery.  DIAGNOSES: 1. Severe single-vessel coronary artery disease. 2. Unstable angina. 3. Normal left ventricular systolic function.  HISTORY:  Tracy [name] is a 77 year old gentleman with a known history of coronary artery disease who presents with a two week history of substernal chest discomfort.  The patient had substernal chest discomfort with indigestion and belching this morning, which was persistent.  He presented to the office and he was subsequently transferred to [facility] for urgent left heart catheterization.  TECHNIQUE:  Informed consent had been obtained from the patient and he was brought to the cardiac catheterization laboratory where both groins were sterilely prepped and draped.  One percent lidocaine was used to infiltrate the right groin and a 6-French sheath was placed in the right femoral artery using the modified Seldinger technique.  Left heart catheterization was then performed using preformed Judkins catheters in the usual fashion.  INITIAL FINDINGS: 1. Left main trunk:  Large caliber vessel with mild irregularities of 20%. 2. Left anterior descending artery:  This is a medium caliber vessel that    provides several small diagonal branches in the mid section.  The proximal    and mid LAD are moderately and diffusely diseased with narrowings of 60%.    There is an apical narrowing of 80% to 90% after the LAD wraps around the    apex. 3. Left  circumflex artery:  This is a small caliber vessel that provides two    marginal branches in the proximal segment and a trivial third marginal    branch distally.  There are mild irregularities in the left circumflex    system. 4. Right coronary artery:  Dominant.  This is a large caliber vessel that    provides a posterior descending artery and several posteroventricular    branches in its terminal segment.  The right coronary artery is severely    diseased in the mid section with eccentric narrowings of 70%.  By angiogram    there is a moderate narrowing in the distal section of 50% prior to the    posterior descending artery.  The posterior descending artery has a    moderate narrowing of 50% in its mid section.  The posteroventricular    branch has a high grade narrowing of 90% at the bifurcation with the    PDA.  The remainder of the vessel has moderate diffuse disease of 50%. 5. Left ventricle:  Normal end-systolic and end-diastolic dimensions.    Overall left ventricular function is well preserved.  Ejection fraction is    greater than 65%, with no mitral regurgitation.  LV pressure is 164/5.    Aortic pressure is 164/90.  LV EDP is 19.  With these findings, we elected to proceed with percutaneous intervention to the right coronary artery for relief of symptoms.  The 6-French sheath was exchanged for a 7-French sheath.  The patient was given  heparin and ReoPro on a weight adjusted basis to maintain ACT of greater than 250 seconds.  In addition, 300 mg of Plavix was given orally.  A 0.014 inch Patriot wire was advanced in the distal RCA and a 3.0 x 15 mm cutting balloon was introduced. Four inflations were performed at the origin of the posterior ventricular segment at 6 atm x 30 seconds.  Repeat angiography showed some improvement in vessel lumen with residual narrowing of approximately 50%.  A 4.0 x 15 mm cutting balloon was introduced and three inflations performed at 6 atm  for 60 seconds.  Repeat angiography showed significant improvement in the vessel lumen, although there was mild haziness at the site of the bifurcation.  We elected to further assess the RCA with intravascular ultrasound and this showed moderate residual disease that did not appear to be critical at the takeoff of the posteroventricular branches.  The distal RCA in the area of 50% narrowing, however, was critically stenosed.  This was a large caliber vessel of 4.0 x 4.5 mm in diameter with tightest stenosis reducing the lumen to approximately 1.5 mm to 2.0 mm.  The mid RCA had a long area of severe disease.  This was a 6 mm vessel in its proximal segment and 5 mm after the area of stenosis with long segment of severe stenosis of 70% to 80%.  We then elected to proceed with stenting of the mid and distal RCA.  The mid RCA was covered with a 5.0 x 28 mm Ultra stent, which was primarily deployed at 12 atm for 60 seconds.  The distal RCA was primarily stented with a 4.0 x 18 mm Penta stent, primarily deployed at 14 atm x 60 seconds.  Repeat angiography showed an excellent result in the mid and distal RCA with only mild residual narrowing of 10% and no evidence of vessel compromise.  Although, the area of angioplasty in the posteroventricular branch still remained slightly hazy. Repeat IVUS was performed showing vessel recoil, calcification, and residual disease in this segment.  We elected to proceed with angioplasty.  A 4.0 x 15 mm CrossSail balloon was introduced and a single inflation performed at 10 atm x 180 seconds.  Final angiography was now performed showing an excellent result with only mild residual narrowing of 30%, no evidence of vessel damage, and resolution of vessel haziness.  There was no distal vessel damage and TIMI-3 flow throughout the right coronary artery.  Angiography was performed in various projections after the administration of intracoronary nitroglycerin showing  persistence of benefit.  This was deemed an acceptable result.  The guide catheter was the removed and the sheath secured into  position.  The patient tolerated the procedure well.  He had chest discomfort with balloon inflation reminiscent of his pain upon presentation.  The patient was then transferred to the floor in stable condition where the sheath will be removed when the ACT returns to normal.  FINAL RESULTS: 1. Successful percutaneous transluminal coronary angioplasty of the distal    right coronary artery at the origin of the posteroventricular branch with    reduction of 90% narrowing to less than 10% with balloon angioplasty. 2. Successful primary stenting of the distal right coronary artery with    reduction of 70% narrowing to less than 10% with placement of a 4.0 x 18 mm    Penta stent. 3. Successful primary stenting of the mid right coronary artery with reduction    of a 70% narrowing to less  than 10% with placement of a 5.0 x 28 mm Ultra    stent.  ASSESSMENT AND PLAN:  Tracy [name] is a 77 year old gentleman who presents with acute coronary syndrome and high grade lesions in the right coronary artery. This has been stabilized with percutaneous intervention.  Should the patient have restenosis or recurrent symptoms, further assessment of the LAD may be beneficial in determining whether coronary artery bypass graft surgery may be indicated as the LAD lesions, while angiographically only moderate, may be critical by IVUS.  At this point, the patient will be stabilized and consideration given towards a stress imaging study in four to six weeks. DD:  02/19/00 TD:  02/19/00 Job: [id number]

## 2010-11-19 ENCOUNTER — Ambulatory Visit (INDEPENDENT_AMBULATORY_CARE_PROVIDER_SITE_OTHER): Payer: Medicare Other | Admitting: Cardiology

## 2010-11-19 ENCOUNTER — Encounter: Payer: Self-pay | Admitting: Cardiology

## 2010-11-19 DIAGNOSIS — I2510 Atherosclerotic heart disease of native coronary artery without angina pectoris: Secondary | ICD-10-CM

## 2010-11-19 DIAGNOSIS — R55 Syncope and collapse: Secondary | ICD-10-CM

## 2010-11-19 DIAGNOSIS — I10 Essential (primary) hypertension: Secondary | ICD-10-CM

## 2010-11-19 NOTE — Assessment & Plan Note (Signed)
The patient has no new sypmtoms.  No further cardiovascular testing is indicated.  We will continue with aggressive risk reduction and meds as listed.  

## 2010-11-19 NOTE — Assessment & Plan Note (Signed)
He has had no further episodes.  No further work up is indicated. 

## 2010-11-19 NOTE — Patient Instructions (Signed)
Follow up in 1 year with Dr Hochrein.  You will receive a letter in the mail 2 months before you are due.  Please call us when you receive this letter to schedule your follow up appointment.  The current medical regimen is effective;  continue present plan and medications.  

## 2010-11-19 NOTE — Progress Notes (Signed)
HPI The patient presents for evaluation of CAD.  Since I last saw him he has had no new problems.  The patient denies any new symptoms such as chest discomfort, neck or arm discomfort. There has been no new shortness of breath, PND or orthopnea. There have been no reported palpitations, presyncope or syncope.  He has some mild occasional dizziness.  Allergies  Allergen Reactions  . Oxycodone Hcl   . Prednisone     Current Outpatient Prescriptions  Medication Sig Dispense Refill  . acetaminophen (TYLENOL) 325 MG tablet Take 650 mg by mouth as needed.        Marland Kitchen amLODipine (NORVASC) 5 MG tablet Take by mouth daily. 1 1/2 tab daily      . cloNIDine (CATAPRES) 0.1 MG tablet Take 0.1 mg by mouth daily.        . furosemide (LASIX) 20 MG tablet Take 20 mg by mouth daily.        . hydrALAZINE (APRESOLINE) 10 MG tablet Take 10 mg by mouth daily.       . potassium chloride (KLOR-CON) 10 MEQ CR tablet Take 10 mEq by mouth daily.        . Tamsulosin HCl (FLOMAX) 0.4 MG CAPS Take by mouth daily.         Past Medical History  Diagnosis Date  . CAD (coronary artery disease)     (status post stent and angioplasty of an obtuse margin in 1997.  Catheterization in 2006,  demonstrated that the left main was normal.  The LAD had proximal 50%  stenosis.  There was mid 60% stenosis.  There was 60-70% stenosis at the  mid diagonal.   . Renal insufficiency   . TIA (transient ischemic attack)   . HTN (hypertension)   . Hyperlipidemia     Past Surgical History  Procedure Date  . Tonsillectomy   . Adenoidectomy   . Nose surgery   . Total knee arthroplasty     ROS:  As stated in the HPI and negative for all other systems.  PHYSICAL EXAM BP 127/76  Pulse 62  Resp 16  Ht 6\' 1"  (1.854 m)  Wt 204 lb (92.534 kg)  BMI 26.91 kg/m2 GENERAL:  Well appearing HEENT:  Pupils equal round and reactive, fundi not visualized, oral mucosa unremarkable NECK:  No jugular venous distention, waveform within normal  limits, carotid upstroke brisk and symmetric, no bruits, no thyromegaly LYMPHATICS:  No cervical, inguinal adenopathy LUNGS:  Clear to auscultation bilaterally BACK:  No CVA tenderness CHEST:  Unremarkable HEART:  PMI not displaced or sustained,S1 and S2 within normal limits, no S3, no S4, no clicks, no rubs, no murmurs ABD:  Flat, positive bowel sounds normal in frequency in pitch, no bruits, no rebound, no guarding, no midline pulsatile mass, no hepatomegaly, no splenomegaly EXT:  2 plus pulses throughout, no edema, no cyanosis no clubbing SKIN:  No rashes no nodules NEURO:  Cranial nerves II through XII grossly intact, motor grossly intact throughout PSYCH:  Cognitively intact, oriented to person place and time  ASSESSMENT AND PLAN

## 2010-11-19 NOTE — Assessment & Plan Note (Signed)
The blood pressure is at target. No change in medications is indicated. We will continue with therapeutic lifestyle changes (TLC).  

## 2010-12-18 LAB — CBC
HCT: 25 — ABNORMAL LOW
HCT: 25.1 — ABNORMAL LOW
HCT: 27.3 — ABNORMAL LOW
HCT: 30.8 — ABNORMAL LOW
Hemoglobin: 10.4 — ABNORMAL LOW
Hemoglobin: 8.6 — ABNORMAL LOW
Hemoglobin: 9.4 — ABNORMAL LOW
MCHC: 34.3
MCV: 90
MCV: 90.3
MCV: 90.8
Platelets: 191
Platelets: 209
RBC: 2.75 — ABNORMAL LOW
RBC: 3.03 — ABNORMAL LOW
RBC: 3.36 — ABNORMAL LOW
WBC: 4.9
WBC: 4.9
WBC: 5.2

## 2010-12-18 LAB — BASIC METABOLIC PANEL
BUN: 20
BUN: 36 — ABNORMAL HIGH
CO2: 24
Calcium: 8.6
Chloride: 107
Chloride: 108
Creatinine, Ser: 2.06 — ABNORMAL HIGH
GFR calc Af Amer: 40 — ABNORMAL LOW
GFR calc Af Amer: 41 — ABNORMAL LOW
GFR calc non Af Amer: 31 — ABNORMAL LOW
GFR calc non Af Amer: 33 — ABNORMAL LOW
Glucose, Bld: 119 — ABNORMAL HIGH
Potassium: 4.4
Potassium: 4.4
Potassium: 4.7
Sodium: 138

## 2010-12-18 LAB — PROTIME-INR
INR: 1.1
INR: 1.1
INR: 2.5 — ABNORMAL HIGH

## 2010-12-18 LAB — CROSSMATCH
ABO/RH(D): O NEG
Antibody Screen: NEGATIVE

## 2011-01-01 ENCOUNTER — Encounter: Payer: Self-pay | Admitting: Cardiology

## 2011-01-04 ENCOUNTER — Other Ambulatory Visit: Payer: Self-pay | Admitting: Cardiology

## 2011-01-04 DIAGNOSIS — I6529 Occlusion and stenosis of unspecified carotid artery: Secondary | ICD-10-CM

## 2011-01-05 ENCOUNTER — Encounter: Payer: Medicare Other | Admitting: *Deleted

## 2011-01-08 ENCOUNTER — Encounter: Payer: Self-pay | Admitting: Cardiology

## 2011-01-22 ENCOUNTER — Other Ambulatory Visit: Payer: Self-pay | Admitting: Orthopaedic Surgery

## 2011-01-22 DIAGNOSIS — M549 Dorsalgia, unspecified: Secondary | ICD-10-CM

## 2011-01-25 ENCOUNTER — Ambulatory Visit
Admission: RE | Admit: 2011-01-25 | Discharge: 2011-01-25 | Disposition: A | Discharge status: Home / Self Care | Payer: Medicare Other | Source: Ambulatory Visit | Attending: Orthopaedic Surgery | Admitting: Orthopaedic Surgery

## 2011-01-25 DIAGNOSIS — M549 Dorsalgia, unspecified: Secondary | ICD-10-CM

## 2011-02-05 ENCOUNTER — Other Ambulatory Visit: Payer: Self-pay | Admitting: Cardiology

## 2011-02-15 ENCOUNTER — Telehealth: Payer: Self-pay | Admitting: Cardiology

## 2011-02-15 NOTE — Telephone Encounter (Signed)
Per pt - Dr Caryn Section wants him to start Lisinopril for his blood pressure and he wants to kow if its OK.  Advised pt that as long as Dr Caryn Section is monitoring his kidney function and his blood pressure doesn't go too low it should be fine.  Pt aware to watch for signs of hypotension.

## 2011-02-15 NOTE — Telephone Encounter (Signed)
Pt was given med, lisinopril .5 mg, wants to make sure ok to take

## 2011-02-16 ENCOUNTER — Other Ambulatory Visit: Payer: Self-pay

## 2011-02-16 ENCOUNTER — Encounter (HOSPITAL_COMMUNITY): Payer: Self-pay | Admitting: Emergency Medicine

## 2011-02-16 ENCOUNTER — Telehealth: Payer: Self-pay | Admitting: Cardiology

## 2011-02-16 ENCOUNTER — Emergency Department (HOSPITAL_COMMUNITY): Payer: Medicare Other

## 2011-02-16 ENCOUNTER — Inpatient Hospital Stay (HOSPITAL_COMMUNITY)
Admission: EM | Admit: 2011-02-16 | Discharge: 2011-02-22 | DRG: 281 | Disposition: A | Discharge status: Home / Self Care | Payer: Medicare Other | Attending: Internal Medicine | Admitting: Internal Medicine

## 2011-02-16 DIAGNOSIS — N183 Chronic kidney disease, stage 3 unspecified: Secondary | ICD-10-CM | POA: Diagnosis present

## 2011-02-16 DIAGNOSIS — I4891 Unspecified atrial fibrillation: Secondary | ICD-10-CM | POA: Diagnosis not present

## 2011-02-16 DIAGNOSIS — N259 Disorder resulting from impaired renal tubular function, unspecified: Secondary | ICD-10-CM | POA: Diagnosis present

## 2011-02-16 DIAGNOSIS — N289 Disorder of kidney and ureter, unspecified: Secondary | ICD-10-CM

## 2011-02-16 DIAGNOSIS — D649 Anemia, unspecified: Secondary | ICD-10-CM | POA: Diagnosis present

## 2011-02-16 DIAGNOSIS — I10 Essential (primary) hypertension: Secondary | ICD-10-CM | POA: Diagnosis present

## 2011-02-16 DIAGNOSIS — Z87891 Personal history of nicotine dependence: Secondary | ICD-10-CM

## 2011-02-16 DIAGNOSIS — Z79899 Other long term (current) drug therapy: Secondary | ICD-10-CM

## 2011-02-16 DIAGNOSIS — I428 Other cardiomyopathies: Secondary | ICD-10-CM | POA: Diagnosis present

## 2011-02-16 DIAGNOSIS — I2510 Atherosclerotic heart disease of native coronary artery without angina pectoris: Secondary | ICD-10-CM | POA: Diagnosis present

## 2011-02-16 DIAGNOSIS — Z7901 Long term (current) use of anticoagulants: Secondary | ICD-10-CM

## 2011-02-16 DIAGNOSIS — I129 Hypertensive chronic kidney disease with stage 1 through stage 4 chronic kidney disease, or unspecified chronic kidney disease: Secondary | ICD-10-CM | POA: Diagnosis present

## 2011-02-16 DIAGNOSIS — I440 Atrioventricular block, first degree: Secondary | ICD-10-CM | POA: Diagnosis present

## 2011-02-16 DIAGNOSIS — D61818 Other pancytopenia: Secondary | ICD-10-CM | POA: Diagnosis present

## 2011-02-16 DIAGNOSIS — Z96659 Presence of unspecified artificial knee joint: Secondary | ICD-10-CM

## 2011-02-16 DIAGNOSIS — E785 Hyperlipidemia, unspecified: Secondary | ICD-10-CM | POA: Diagnosis present

## 2011-02-16 DIAGNOSIS — I214 Non-ST elevation (NSTEMI) myocardial infarction: Secondary | ICD-10-CM

## 2011-02-16 DIAGNOSIS — Z9861 Coronary angioplasty status: Secondary | ICD-10-CM

## 2011-02-16 DIAGNOSIS — I509 Heart failure, unspecified: Secondary | ICD-10-CM

## 2011-02-16 DIAGNOSIS — Z8673 Personal history of transient ischemic attack (TIA), and cerebral infarction without residual deficits: Secondary | ICD-10-CM

## 2011-02-16 HISTORY — DX: Chronic kidney disease, stage 3 (moderate): N18.3

## 2011-02-16 HISTORY — DX: Unspecified atrial fibrillation: I48.91

## 2011-02-16 HISTORY — DX: Chronic kidney disease, stage 3 unspecified: N18.30

## 2011-02-16 HISTORY — DX: Shortness of breath: R06.02

## 2011-02-16 HISTORY — DX: Unspecified osteoarthritis, unspecified site: M19.90

## 2011-02-16 LAB — DIFFERENTIAL
Basophils Relative: 0 % (ref 0–1)
Eosinophils Absolute: 0.1 10*3/uL (ref 0.0–0.7)
Lymphs Abs: 0.5 10*3/uL — ABNORMAL LOW (ref 0.7–4.0)
Monocytes Absolute: 0.3 10*3/uL (ref 0.1–1.0)
Neutro Abs: 4.4 10*3/uL (ref 1.7–7.7)
Neutrophils Relative %: 85 % — ABNORMAL HIGH (ref 43–77)

## 2011-02-16 LAB — URINALYSIS, ROUTINE W REFLEX MICROSCOPIC
Bilirubin Urine: NEGATIVE
Hgb urine dipstick: NEGATIVE
Nitrite: NEGATIVE
Protein, ur: 30 mg/dL — AB
Urobilinogen, UA: 0.2 mg/dL (ref 0.0–1.0)

## 2011-02-16 LAB — URINE MICROSCOPIC-ADD ON

## 2011-02-16 LAB — CBC
HCT: 30.1 % — ABNORMAL LOW (ref 39.0–52.0)
Hemoglobin: 10 g/dL — ABNORMAL LOW (ref 13.0–17.0)
MCH: 32.5 pg (ref 26.0–34.0)
MCHC: 33.2 g/dL (ref 30.0–36.0)
MCV: 97.7 fL (ref 78.0–100.0)

## 2011-02-16 LAB — BASIC METABOLIC PANEL
BUN: 38 mg/dL — ABNORMAL HIGH (ref 6–23)
GFR calc non Af Amer: 24 mL/min — ABNORMAL LOW (ref >90)
Glucose, Bld: 116 mg/dL — ABNORMAL HIGH (ref 70–99)
Potassium: 4.5 mEq/L (ref 3.5–5.1)

## 2011-02-16 LAB — CARDIAC PANEL(CRET KIN+CKTOT+MB+TROPI)
Relative Index: 5.2 — ABNORMAL HIGH (ref 0.0–2.5)
Relative Index: 5.7 — ABNORMAL HIGH (ref 0.0–2.5)
Troponin I: 2 ng/mL (ref <0.30)

## 2011-02-16 LAB — PROTIME-INR: Prothrombin Time: 14.9 seconds (ref 11.6–15.2)

## 2011-02-16 LAB — TSH: TSH: 0.685 u[IU]/mL (ref 0.350–4.500)

## 2011-02-16 MED ORDER — ONDANSETRON HCL 4 MG/2ML IJ SOLN: 4.0000 mg | Freq: Four times a day (QID) | INTRAMUSCULAR | Status: DC | PRN

## 2011-02-16 MED ORDER — ACETAMINOPHEN 325 MG PO TABS: 650.0000 mg | ORAL_TABLET | Freq: Four times a day (QID) | ORAL | Status: DC | PRN

## 2011-02-16 MED ORDER — ROSUVASTATIN CALCIUM 10 MG PO TABS
10.0000 mg | ORAL_TABLET | Freq: Every day | ORAL | Status: DC
  Administered 2011-02-16 – 2011-02-22 (×7): 10 mg via ORAL
  Filled 2011-02-16 (×7): qty 1

## 2011-02-16 MED ORDER — TAMSULOSIN HCL 0.4 MG PO CAPS
0.4000 mg | ORAL_CAPSULE | Freq: Every day | ORAL | Status: DC
  Administered 2011-02-16 – 2011-02-21 (×6): 0.4 mg via ORAL
  Filled 2011-02-16 (×7): qty 1

## 2011-02-16 MED ORDER — ASPIRIN 81 MG PO CHEW
324.0000 mg | CHEWABLE_TABLET | Freq: Once | ORAL | Status: AC
  Administered 2011-02-16: 324 mg via ORAL
  Filled 2011-02-16: qty 4

## 2011-02-16 MED ORDER — HEPARIN (PORCINE) IN NACL 100-0.45 UNIT/ML-% IJ SOLN
1200.0000 [IU]/h | Freq: Once | INTRAMUSCULAR | Status: AC
  Administered 2011-02-16: 1200 [IU]/h via INTRAVENOUS
  Filled 2011-02-16: qty 250

## 2011-02-16 MED ORDER — CLONIDINE HCL 0.1 MG PO TABS
0.1000 mg | ORAL_TABLET | Freq: Every day | ORAL | Status: DC
  Administered 2011-02-16 – 2011-02-22 (×7): 0.1 mg via ORAL
  Filled 2011-02-16 (×7): qty 1

## 2011-02-16 MED ORDER — METOPROLOL TARTRATE 25 MG PO TABS
25.0000 mg | ORAL_TABLET | Freq: Two times a day (BID) | ORAL | Status: DC
  Administered 2011-02-16 – 2011-02-17 (×4): 25 mg via ORAL
  Filled 2011-02-16 (×5): qty 1

## 2011-02-16 MED ORDER — METOPROLOL TARTRATE 1 MG/ML IV SOLN
5.0000 mg | Freq: Once | INTRAVENOUS | Status: AC
  Administered 2011-02-16: 5 mg via INTRAVENOUS
  Filled 2011-02-16: qty 5

## 2011-02-16 MED ORDER — FUROSEMIDE 10 MG/ML IJ SOLN
40.0000 mg | Freq: Once | INTRAMUSCULAR | Status: AC
  Administered 2011-02-16: 40 mg via INTRAVENOUS
  Filled 2011-02-16: qty 4

## 2011-02-16 MED ORDER — NITROGLYCERIN 2 % TD OINT: 1.0000 [in_us] | TOPICAL_OINTMENT | Freq: Once | TRANSDERMAL | Status: DC

## 2011-02-16 MED ORDER — NITROGLYCERIN 0.4 MG SL SUBL
0.4000 mg | SUBLINGUAL_TABLET | SUBLINGUAL | Status: AC | PRN
  Administered 2011-02-16 (×3): 0.4 mg via SUBLINGUAL
  Filled 2011-02-16: qty 25

## 2011-02-16 MED ORDER — SODIUM CHLORIDE 0.9 % IJ SOLN
3.0000 mL | Freq: Two times a day (BID) | INTRAMUSCULAR | Status: DC
  Administered 2011-02-16 – 2011-02-17 (×2): 3 mL via INTRAVENOUS

## 2011-02-16 MED ORDER — HEPARIN (PORCINE) IN NACL 100-0.45 UNIT/ML-% IJ SOLN
1800.0000 [IU]/h | INTRAMUSCULAR | Status: DC
  Administered 2011-02-17: 1200 [IU]/h via INTRAVENOUS
  Administered 2011-02-18: 1600 [IU]/h via INTRAVENOUS
  Administered 2011-02-18: 1200 [IU]/h via INTRAVENOUS
  Administered 2011-02-18 (×2): 1600 [IU]/h via INTRAVENOUS
  Filled 2011-02-16 (×7): qty 250

## 2011-02-16 MED ORDER — HYDRALAZINE HCL 10 MG PO TABS
10.0000 mg | ORAL_TABLET | Freq: Every day | ORAL | Status: DC
  Administered 2011-02-16 – 2011-02-21 (×6): 10 mg via ORAL
  Filled 2011-02-16 (×6): qty 1

## 2011-02-16 MED ORDER — FUROSEMIDE 20 MG PO TABS
20.0000 mg | ORAL_TABLET | Freq: Every day | ORAL | Status: DC
  Administered 2011-02-16 – 2011-02-19 (×4): 20 mg via ORAL
  Filled 2011-02-16 (×5): qty 1

## 2011-02-16 MED ORDER — POTASSIUM CHLORIDE CRYS ER 10 MEQ PO TBCR
10.0000 meq | EXTENDED_RELEASE_TABLET | Freq: Every day | ORAL | Status: DC
  Administered 2011-02-16 – 2011-02-19 (×4): 10 meq via ORAL
  Filled 2011-02-16 (×5): qty 1

## 2011-02-16 MED ORDER — SODIUM CHLORIDE 0.9 % IJ SOLN: 3.0000 mL | INTRAMUSCULAR | Status: DC | PRN

## 2011-02-16 MED ORDER — ASPIRIN EC 81 MG PO TBEC
81.0000 mg | DELAYED_RELEASE_TABLET | Freq: Every day | ORAL | Status: DC
  Administered 2011-02-17 – 2011-02-22 (×6): 81 mg via ORAL
  Filled 2011-02-16 (×6): qty 1

## 2011-02-16 MED ORDER — NITROGLYCERIN IN D5W 200-5 MCG/ML-% IV SOLN
3.0000 ug/min | INTRAVENOUS | Status: DC
  Administered 2011-02-16: 3 ug/min via INTRAVENOUS
  Administered 2011-02-18: 5 ug/min via INTRAVENOUS
  Filled 2011-02-16: qty 250

## 2011-02-16 MED ORDER — HEPARIN BOLUS VIA INFUSION
4000.0000 [IU] | Freq: Once | INTRAVENOUS | Status: AC
  Administered 2011-02-16: 4000 [IU] via INTRAVENOUS

## 2011-02-16 MED ORDER — NITROGLYCERIN 0.4 MG SL SUBL: 0.4000 mg | SUBLINGUAL_TABLET | SUBLINGUAL | Status: DC | PRN

## 2011-02-16 NOTE — ED Notes (Signed)
Troponin result of 2.38 shown to Dr Verne Spurr.

## 2011-02-16 NOTE — ED Notes (Signed)
Received patient and assumed care, pt alert, oriented x4 and verbally responsive, pt presents with c/o CP and SOB for two days, pt states pain feels like he was hit with baseball bat across chest, denies N/V, tingling sensation and  numbness

## 2011-02-16 NOTE — ED Notes (Signed)
Pt resting quietly on stretcher with eyes open, pt states pain 1/10 on nitro and heparin drip, ER md aware of troponin level, call placed to admitting MD

## 2011-02-16 NOTE — ED Notes (Signed)
Pt medicated with second dose of nitro 0.4mg  for c/o CP 5/10

## 2011-02-16 NOTE — ED Notes (Signed)
Pt brought back from triage; pt undressed, in gown, on monitor, continuous pulse oximetry and blood pressure cuff; EKG performed; introduced self; family at bedside

## 2011-02-16 NOTE — ED Notes (Signed)
MD at bedside. 

## 2011-02-16 NOTE — Telephone Encounter (Signed)
Spoke with pt, this am bp 202/106 p 84 c/o CP, SOB, pt has no nitro available. He takes bp meds at [facility] and did take last night. He was to start lisinopril and was prescribed last Friday and didn't have it filled yet. Advised to go to ER by 911, pt refused and said his wife will drive him. Explained safety and health risk to him, he replied with he is 76 yrs old, he dont expect to live forever. I called [facility] ER and spoke with Arline Asp rn and gave report and is a  Dr Antoine Poche pt.

## 2011-02-16 NOTE — H&P (Signed)
Date: 02/16/2011                  Patient Name:  Tracy [name]  MRN: [id number]  DOB: Apr 15, 1919  Age / Sex: 77 y.o., male         PCP: [name], MD  Primary Cardiologist: [name] MD    Chief Complaint: 77yo caucasian male w/ PMHx CAD, HTN, HLD, CKD (Stage 3), and TIA who presented to [facility] with chest pain and SOB x 3 days.  Problem List: NSTEMI - inferolateral  CAD   * s/p BMS OM '97, BMS mid & distal RCA '01, Cath '06 small vessel high-grade disease, diffuse   nonobstructive large vessel disease)  * Normal stress nuclear study w/ EF 64% 5/12  ATRIAL FIBRILLATION (new onset) HYPERLIPIDEMIA  HYPERTENSION  CARDIOMYOPATHY RENAL INSUFFICIENCY STAGE 3   Past Medical History  Diagnosis Date  . CAD (coronary artery disease)     (status post stent and angioplasty of an obtuse margin in 1997.  Catheterization in 2006,  demonstrated that the left main was normal.  The LAD had proximal 50%  stenosis.  There was mid 60% stenosis.  There was 60-70% stenosis at the  mid diagonal.   . Chronic kidney disease, stage III (moderate)    Carotid Stenosis   . TIA (transient ischemic attack)   . HTN (hypertension)   . Hyperlipidemia     Past Surgical History  Procedure Date  . Tonsillectomy   . Adenoidectomy   . Nose surgery   . Total knee arthroplasty      Allergies: Allergies  Allergen Reactions  . Oxycodone Hcl   . Prednisone     History of Present Illness: Patient is a 77 y.o. male with a PMHx of CAD (s/p ?BMS OM '97, BMS mid & distal RCA '01, Cath '06 small vessel high-grade disease, diffuse nonobstructive large vessel disease; normal stress nuclear study 5/12), HTN, HLD, CKD (Stage 3), and TIA who presents to [facility] for evaluation of acute onset chest pain and shortness of breath that began 2 days prior to admission.  The shortness of breath started while the patient was watching television on Sunday. He then walked up ~13 steps and experienced left sided chest pain that  he describes as someone punching him in the chest. The pain is rated as a 5/10 in severity at onset and currently rated 3/10. The pain is notably aggravated by exertion and deep inhalation. It is mildly relieved by nitroglycerin. Not associated with nausea, diaphoresis, or dizziness. The chest pain has been intermittent since Sunday, lasting about 3-5 seconds each time and resolving without intervention. He thought it was indigestion due to eating a hot dog and a lot of beans but it wasn't relieved with TUMS.  He reports the shortness of breath has been constant but worsened this morning when he woke up this morning and went to the bathroom. He had to sit on the side of the bed and catch his breath for about 2 minutes. At this time he checked his BP and it was 202/106. He called Dr. Jenene Slicker office and was told to present to the ED. He endorses PND and palpitations, but denies orthopnea, syncope, fever, chills, sweats, long travel, immobility, lower extremity edema, or history of malignancy or hypercoagulability.  In the ED his EKG revealed sinus rhythm, 69 bpm, and was remarkable for ST depressions in the inferolateral leads. His initial troponin was 2.38 and he continued to have intermittent chest pains  despite 3 SL nitros. CXR revealed diffuse interstitial pulmonary edema. BNP was 3945 and creatinine 2.26. He was given 324mg  ASA, 40mg  IV lasix, and initiated on a heparin drip. Upon my initial examination he was noted to be in atrial fibrillation on monitor. Telemetry was reviewed and he appeared to go into a. Fib around 1145am. EKG showed a.fib, 117bpm w/ inferolateral ST depressions. The patient continued to be short of breath and have intermittent chest pains during my examination. A metoprolol IV bolus and nitroglycerin drip were ordered. He will be admitted to stepdown for further management.   Current Outpatient Medications: Medication Sig  acetaminophen (TYLENOL) 325 MG tablet Take 650 mg by mouth  as needed. pain  amLODipine (NORVASC) 5 MG tablet  Take 1 and 1/2 tablets daily  cloNIDine (CATAPRES) 0.1 MG tablet Take 0.1 mg by mouth daily.    furosemide (LASIX) 20 MG tablet Take 20 mg by mouth daily.    hydrALAZINE (APRESOLINE) 10 MG tablet Take 10 mg by mouth daily.   potassium chloride (KLOR-CON) 10 MEQ CR tablet Take 10 mEq by mouth daily.    lisinopril (PRINIVIL,ZESTRIL) 2.5 MG tablet Take 2.5 mg by mouth daily. Patient was to start taking today.   Tamsulosin HCl (FLOMAX) 0.4 MG CAPS Take 0.4 mg by mouth at bedtime.     Family History: History reviewed. No pertinent family history.  Social History: History   Social History  . Marital Status: Married   Occupational History  . Retired Investment banker, operational   Social History Main Topics  . Smoking status: Former Games developer  . Smokeless tobacco: Not on file   Comment: 1980 quit  . Alcohol Use: Yes   Review of Systems: Constitutional:  Negative for fever, chills, night sweats, or weight change  Respiratory: Cough; Negative for wheezing.  Cardiovascular: As per HPI  Gastrointestinal: Negative for nausea, vomiting, abdominal pain, diarrhea, constipation, melena, hematochezia, or hematemesis.  Genitourinary:  Frequency & Nocturia; Negative for dysuria, urgency,hematuria, flank pain and difficulty urinating.  Skin: Negative for rash  Neurological: Negative for dizziness, syncope, or visual changes.    All other systems reviewed and are otherwise negative except as noted above.   Vital Signs: Blood pressure 127/86, pulse 104, temperature 97.7 F (36.5 C), temperature source Oral, resp. rate 22, height 6\' 1"  (1.854 m), weight 92.534 kg (204 lb), SpO2 97.00%.    Physical Exam: General: Pleasant white male, in no acute distress.  HEENT: Normocephalic, atraumatic, sclera non-icteric, no xanthomas, nares are without discharge.  Neck: Supple, No carotid Bruits, no JVD.  Lungs:  Mildly dyspneic with conversation; Clear to auscultation BL without  crackles, rhonchi, or wheezes.  Heart: Irregular rhythm, mildly tachycardic. S1 and S2 normal without gallop, murmur, or rubs appreciated.  Abdomen:  BS normoactive. Soft, Nondistended, non-tender.  No masses or organomegaly.  No rebound/guarding.   Extremities: No clubbing, cyanosis or edema. Distal pedal pulses are 2+ and equal bilaterally.  Neurologic: A&O X3. Moves all extremities spontaneously.  Psych:   Responds to questions appropriately with a normal affect.   Lab results: Troponin i, poc 2.38 (*)   Basic Metabolic Panel: Recent Labs  [facility] 02/16/11 1044   NA 137   K 4.5   CL 105   CO2 24   GLUCOSE 116*   BUN 38*   CREATININE 2.26*   CALCIUM 9.1   CBC: Recent Labs  Basename 02/16/11 1044   WBC 5.3   NEUTROABS 4.4   HGB 10.0*   HCT 30.1*  MCV 97.7   PLT 117*   BNP: Recent Labs  [facility] 02/16/11 1044   POCBNP 3945.0*     Imaging results:  Dg Chest 2 View 02/16/2011   Findings: The patient has developed diffuse bilateral perihilar and bibasilar interstitial accentuation consistent with diffuse interstitial pulmonary edema.  There are also small bilateral pleural effusions.  The heart is borderline in size.  The aorta is elongated ectatic.  There is no evidence for mediastinal or hilar adenopathy.  IMPRESSION: Interval development of diffuse interstitial pulmonary edema.    Other results: EKG:  * 11/20 @ 0913: sinus rhythm, 69 bpm, w/ ST depressions in the inferolateral leads.  * 11/20 @ 1331 a.fib, 117bpm w/ inferolateral ST depressions   Last Echo (07/2009): EF 50%, mild MR, LA mild dilation, Distal septal & inferolateral hypokinesis   Last Cath (2006): 1.  The left main was normal. 2.  The LAD had a proximal 50% stenosis.  There was mid 60% stenosis.  There is a 60-70% lesion after a mid diagonal.  There were diffuse 30-40% lesions throughout the proximal and midsegment.  This was a calcified vessel that wrapped the apex.  The diagonals were small and  free of high-grade disease.  3.  The circumflex was a small vessel in the AV groove.  There was a large ramus intermedius.  It had a long, mid 50% stenosis.  There was a first obtuse marginal which was a narrow caliber vessel of 1-mm.  It had an 80-90% midstenosis. 4.  The right coronary artery was a very large vessel.  It was dominant. There was a proximal stent and a distal stent before the PDA.  There were diffuse InStent luminal irregularities.  There were diffuse 25-30% lesions throughout this vessel.  The PDA was small with diffuse 50% stenosis, posterolateral x3 had moderate diffuse disease.   LEFT VENTRICULOGRAM:  The EF was 65%.  Last Stress (5/12): Normal stress nuclear study w/ EF 64%   Assessment & Plan: Pt is a 77 y.o. yo male with a PMHX of CAD (s/p ?BMS OM '97, BMS mid & distal RCA '01, Cath '06 small vessel high-grade disease, diffuse nonobstructive large vessel disease; normal stress nuclear study 5/12), HTN, HLD, CKD (Stage 3), and TIA , who presented to [facility] on 02/16/2011 with symptoms of shortness of breath and chest pain, with EKG showing ST depressions in inferolateral leads and atrial fibrillation and initial troponins of 2.338 with BNP 3945.    1. CAD/NSTEMI: Due to his significant cardiac history and new ST depressions w/ elevated troponin, he will be admitted to stepdown. It is unclear if his shortness of breath and chest pain are due entirely to coronary ischemia, as there is some concern for a PE. Nitroglycerin drip will be continued and titrated until free of chest pain. He will be anticoagulated with a heparin drip and started on ASA 81mg . Will also place him on metoprolol and a statin. An echo will be obtained to assess for wall motion abnormalities and LV function as well as RV strain. Cycle cardiac enzymes. Will have to weigh risk vs benefit of cath in regards to his renal status. 2. A. Fibrillation: Metoprolol IV bolus given in ED. Will place him on metoprolol 25mg  BID and  assess need for uptitration. Cont heparin drip. Check TSH, Mg 3. Dyspnea: Diuresed well in the ED after 40mg  IV lasix. CXR revealed diffuse interstitial pulmonary edema. Lungs are presently clear to auscultation and no JVD or lower extremity edema  is noted. Will hold on any further diuresis at this time. If echo indicates any RV strain or his dyspnea worsens, will consider VQ scan. No ddimer ordered at this time due to it likely being elevated secondary to CKD. 4. HTN: Cont home clonidine and hydralazine. Add metoprolol 5. HLD: Lipid panel pending. Place on statin. 6. CKD: Creatinine 2.26 which appears to be his baseline. Will hold any further diuresis at this time as well as hold his lisinopril.   HOPE, JESSICA PA-C 02/16/2011 2:44 PM  Attending Note:   The patient was seen and examined.  Agree with assessment and plan as noted above.  Mr. Dilauro is feeling better that he was yesterday.  Has new onset a-Fib with RVR as well signs and symptoms of NSTEMI.     Exam : see above  I think our Moritz option is for medical theapy.  He is at high risk for renal failure if we decide to do a cath.  A stress myoview in May of this year revealed no ischemia and  normal LV function.  His HR is already better after getting IV metoprolol.  We have added Metoprolol 25 BID  And anticipate increasing as needed.   Vesta Mixer, Montez Hageman., MD, [facility] 02/16/2011, 5:57 PM

## 2011-02-16 NOTE — ED Notes (Signed)
Pt reading a book; pt has no needs at this time

## 2011-02-16 NOTE — Progress Notes (Signed)
ANTICOAGULATION CONSULT NOTE - Initial Consult  Pharmacy Consult for Heparin Indication: ACS/NSTEMI  Allergies  Allergen Reactions  . Oxycodone Hcl   . Prednisone     Patient Measurements: Height: 6\' 1"  (185.4 cm) Weight: 204 lb (92.534 kg) IBW/kg (Calculated) : 79.9  Adjusted Body Weight:   Vital Signs: Temp: 97.7 F (36.5 C) (11/20 0911) Temp src: Oral (11/20 0911) BP: 146/77 mmHg (11/20 1200) Pulse Rate: 97  (11/20 1200)  Labs:  Basename 02/16/11 1044  HGB 10.0*  HCT 30.1*  PLT 117*  APTT --  LABPROT --  INR --  HEPARINUNFRC --  CREATININE 2.26*  CKTOTAL --  CKMB --  TROPONINI --   Estimated Creatinine Clearance: 25 ml/min (by C-G formula based on Cr of 2.26).  Medical History: Past Medical History  Diagnosis Date  . CAD (coronary artery disease)     (status post stent and angioplasty of an obtuse margin in 1997.  Catheterization in 2006,  demonstrated that the left main was normal.  The LAD had proximal 50%  stenosis.  There was mid 60% stenosis.  There was 60-70% stenosis at the  mid diagonal.   . Renal insufficiency   . TIA (transient ischemic attack)   . HTN (hypertension)   . Hyperlipidemia     Medications:  See electronic med rec  Assessment: 89yom to start Heparin for NSTEMI / ACS. Pt reports not taking anticoagulants and reports no current bleeding.  - INR 1.15 - Hg and Plts low: monitor closely  Goal of Therapy:  Heparin level 0.3-0.7 units/ml   Plan:  1. Heparin IV Bolus 4000 units x 1 2. Heparin drip 1200 units/hr (12 ml/hr) 3. Check heparin level 8 hours after heparin drip initiation 4. Daily heparin level and CBC 5. PT/INR now  Cleon Dew [phone] 02/16/2011,1:04 PM

## 2011-02-16 NOTE — ED Notes (Signed)
MD at bedside. Dr Elease Hashimoto at bedside with patient.

## 2011-02-16 NOTE — ED Notes (Signed)
Heparin verified with morgan brown rn and Koree Staheli rn

## 2011-02-16 NOTE — ED Notes (Signed)
Clarene Duke, MD handed EKGs

## 2011-02-16 NOTE — Telephone Encounter (Signed)
Pt in ED at [facility].  Dr Antoine Poche aware

## 2011-02-16 NOTE — ED Notes (Signed)
Medicated as order with nitroglycerin 0.4mg  SL for c/o CP 5/10, medication not effective

## 2011-02-16 NOTE — ED Provider Notes (Signed)
History     CSN: [id number] Arrival date & time: 02/16/2011  8:59 AM     Chief Complaint  Patient presents with  . Chest Pain  . Shortness of Breath    HPI Pt was seen at 1020.  Per pt and spouse, c/o gradual onset and persistence of multiple intermittent episodes of chest pain and SOB for the past 2 days.  Pt describes the CP as intermittent, lasting "2-3 seconds" each episode, and "like I'm being punched in the chest."  Symptoms worsen with exertion.  Denies palpitations, no cough, no fevers, no abd pain, no back pain, no N/V/D.     Cards:  Dr. Antoine Poche PMD:  Dr. Doristine Counter [facility]) Past Medical History  Diagnosis Date  . CAD (coronary artery disease)     (status post stent and angioplasty of an obtuse margin in 1997.  Catheterization in 2006,  demonstrated that the left main was normal.  The LAD had proximal 50%  stenosis.  There was mid 60% stenosis.  There was 60-70% stenosis at the  mid diagonal.   . Renal insufficiency   . TIA (transient ischemic attack)   . HTN (hypertension)   . Hyperlipidemia     Past Surgical History  Procedure Date  . Tonsillectomy   . Adenoidectomy   . Nose surgery   . Total knee arthroplasty     History  Substance Use Topics  . Smoking status: Former Games developer  . Smokeless tobacco: Not on file   Comment: 1980 quit  . Alcohol Use: Yes    Review of Systems ROS: Statement: All systems negative except as marked or noted in the HPI; Constitutional: Negative for fever and chills. ; ; Eyes: Negative for eye pain, redness and discharge. ; ; ENMT: Negative for ear pain, hoarseness, nasal congestion, sinus pressure and sore throat. ; ; Cardiovascular: +CP, SOB. Negative for palpitations, diaphoresis, and peripheral edema. ; ; Respiratory: Negative for cough, wheezing and stridor. ; ; Gastrointestinal: Negative for nausea, vomiting, diarrhea and abdominal pain, blood in stool, hematemesis, jaundice and rectal bleeding. . ; ;  Genitourinary: Negative for dysuria, flank pain and hematuria. ; ; Musculoskeletal: Negative for back pain and neck pain. Negative for swelling and trauma.; ; Skin: Negative for pruritus, rash, abrasions, blisters, bruising and skin lesion.; ; Neuro: Negative for headache, lightheadedness and neck stiffness. Negative for weakness, altered level of consciousness , altered mental status, extremity weakness, paresthesias, involuntary movement, seizure and syncope.     Allergies  Oxycodone hcl and Prednisone  Home Medications   Current Outpatient Rx  Name Route Sig Dispense Refill  . ACETAMINOPHEN 325 MG PO TABS Oral Take 650 mg by mouth as needed. pain    . AMLODIPINE BESYLATE 5 MG PO TABS       . CLONIDINE HCL 0.1 MG PO TABS Oral Take 0.1 mg by mouth daily.      . FUROSEMIDE 20 MG PO TABS Oral Take 20 mg by mouth daily.      Marland Kitchen HYDRALAZINE HCL 10 MG PO TABS Oral Take 10 mg by mouth daily.     Marland Kitchen POTASSIUM CHLORIDE 10 MEQ PO TBCR Oral Take 10 mEq by mouth daily.      Marland Kitchen LISINOPRIL 2.5 MG PO TABS Oral Take 2.5 mg by mouth daily. Patient was to start taking today.     Marland Kitchen TAMSULOSIN HCL 0.4 MG PO CAPS Oral Take 0.4 mg by mouth at bedtime.       BP 177/81  Pulse 69  Temp(Src) 97.7 F (36.5 C) (Oral)  Resp 18  Ht 6\' 1"  (1.854 m)  Wt 204 lb (92.534 kg)  BMI 26.91 kg/m2  SpO2 94%  Physical Exam 1025: Physical examination:  Nursing notes reviewed; Vital signs and O2 SAT reviewed;  Constitutional: Well developed, Well nourished, Well hydrated, In no acute distress; Head:  Normocephalic, atraumatic; Eyes: EOMI, PERRL, No scleral icterus; ENMT: Mouth and pharynx normal, Mucous membranes moist; Neck: Supple, Full range of motion, No lymphadenopathy; Cardiovascular: Regular rate and rhythm, No murmur or gallop; Respiratory: Breath sounds coarse & equal bilaterally, No wheezes, Normal respiratory effort/excursion, speaking full sentences; Chest: Nontender, Movement normal; Abdomen: Soft, Nontender,  Nondistended, Normal bowel sounds; Extremities: Pulses normal, No tenderness, No edema, No calf edema or asymmetry.; Neuro: AA&Ox3, Major CN grossly intact.  No gross focal motor or sensory deficits in extremities.; Skin: Color normal, Warm, Dry, no rash.    ED Course  Procedures    MDM  MDM Reviewed: nursing note and vitals Reviewed previous: ECG Interpretation: labs, x-ray and ECG    Date: 02/16/2011  Rate: 69  Rhythm: normal sinus rhythm  QRS Axis: left  Intervals: PR prolonged  ST/T Wave abnormalities: flipped T-waves in lateral leads, LVH  Conduction Disutrbances:first-degree A-V block  and nonspecific intraventricular conduction delay  Narrative Interpretation:   Old EKG Reviewed: unchanged; no significant changes from previous EKG dated 05/16/2007.   Results for orders placed during the hospital encounter of 02/16/11  BASIC METABOLIC PANEL      Component Value Range   Sodium 137  135 - 145 (mEq/L)   Potassium 4.5  3.5 - 5.1 (mEq/L)   Chloride 105  96 - 112 (mEq/L)   CO2 24  19 - 32 (mEq/L)   Glucose, Bld 116 (*) 70 - 99 (mg/dL)   BUN 38 (*) 6 - 23 (mg/dL)   Creatinine, Ser 1.61 (*) 0.50 - 1.35 (mg/dL)   Calcium 9.1  8.4 - 09.6 (mg/dL)   GFR calc non Af Amer 24 (*) >90 (mL/min)   GFR calc Af Amer 28 (*) >90 (mL/min)  CBC      Component Value Range   WBC 5.3  4.0 - 10.5 (K/uL)   RBC 3.08 (*) 4.22 - 5.81 (MIL/uL)   Hemoglobin 10.0 (*) 13.0 - 17.0 (g/dL)   HCT 04.5 (*) 40.9 - 52.0 (%)   MCV 97.7  78.0 - 100.0 (fL)   MCH 32.5  26.0 - 34.0 (pg)   MCHC 33.2  30.0 - 36.0 (g/dL)   RDW 81.1 (*) 91.4 - 15.5 (%)   Platelets 117 (*) 150 - 400 (K/uL)  DIFFERENTIAL      Component Value Range   Neutrophils Relative 85 (*) 43 - 77 (%)   Lymphocytes Relative 9 (*) 12 - 46 (%)   Monocytes Relative 5  3 - 12 (%)   Eosinophils Relative 1  0 - 5 (%)   Basophils Relative 0  0 - 1 (%)   Neutro Abs 4.4  1.7 - 7.7 (K/uL)   Lymphs Abs 0.5 (*) 0.7 - 4.0 (K/uL)   Monocytes Absolute  0.3  0.1 - 1.0 (K/uL)   Eosinophils Absolute 0.1  0.0 - 0.7 (K/uL)   Basophils Absolute 0.0  0.0 - 0.1 (K/uL)   Smear Review MORPHOLOGY UNREMARKABLE    URINALYSIS, ROUTINE W REFLEX MICROSCOPIC      Component Value Range   Color, Urine YELLOW  YELLOW    Appearance CLEAR  CLEAR  Specific Gravity, Urine 1.011  1.005 - 1.030    pH 6.0  5.0 - 8.0    Glucose, UA NEGATIVE  NEGATIVE (mg/dL)   Hgb urine dipstick NEGATIVE  NEGATIVE    Bilirubin Urine NEGATIVE  NEGATIVE    Ketones, ur NEGATIVE  NEGATIVE (mg/dL)   Protein, ur 30 (*) NEGATIVE (mg/dL)   Urobilinogen, UA 0.2  0.0 - 1.0 (mg/dL)   Nitrite NEGATIVE  NEGATIVE    Leukocytes, UA NEGATIVE  NEGATIVE   PRO B NATRIURETIC PEPTIDE      Component Value Range   BNP, POC 3945.0 (*) 0 - 450 (pg/mL)  POCT I-STAT TROPONIN I      Component Value Range   Troponin i, poc 2.38 (*) 0.00 - 0.08 (ng/mL)   Comment NOTIFIED PHYSICIAN     Comment 3           URINE MICROSCOPIC-ADD ON      Component Value Range   Squamous Epithelial / LPF RARE  RARE    WBC, UA 0-2  <3 (WBC/hpf)   RBC / HPF 0-2  <3 (RBC/hpf)   Bacteria, UA RARE  RARE    Results for GOLDEN, GILREATH (MRN [id number]) as of 02/16/2011 11:53  Ref. Range 06/30/2010 10:28 02/16/2011 10:44  BUN Latest Range: 6-23 mg/dL 40 (H) 38 (H)  Creat Latest Range: 0.50-1.35 mg/dL 2.9 (H) 8.65 (H)   Results for JOHNNELL, LIOU (MRN [id number]) as of 02/16/2011 11:53  Ref. Range 06/11/2010 13:46 06/30/2010 10:28 02/16/2011 10:44  BNP, POC Latest Range: 0-450 pg/mL 343.4 (H) 187.8 (H) 3945.0 (H)    Dg Chest 2 View  02/16/2011  *RADIOLOGY REPORT*  Clinical Data: Shortness of breath.  Possible congestive heart failure.  CHEST - 2 VIEW  Comparison: 04/02/2009.  Findings: The patient has developed diffuse bilateral perihilar and bibasilar interstitial accentuation consistent with diffuse interstitial pulmonary edema.  There are also small bilateral pleural effusions.  The heart is borderline in size.  The aorta is  elongated ectatic.  There is no evidence for mediastinal or hilar adenopathy.  IMPRESSION: Interval development of diffuse interstitial pulmonary edema.  Original Report Authenticated By: Rolla Plate, M.D.    11:55 AM:  Pt continues to state he feels "ok," reading book and talking with spouse at bedside.  SOB improved while laying on stretcher.  CP continues to "come and go" for "a few seconds" at a time.  Troponin and BNP elevated, EKG without acute STE.  Cr elevated, but near baseline.  Will dose ASA, ntg, lasix, heparin.  Will admit.  Dx testing d/w pt and family.  Questions answered.  Verb understanding, agreeable to admit.    1155:  T/C to [employer], case discussed, including:  HPI, pertinent PM/SHx, VS/PE, dx testing, ED course and treatment.  Agreeable to admit. Will come to ED for eval.         [facility]      Laray Anger, DO 02/16/11 2052

## 2011-02-16 NOTE — ED Notes (Signed)
Warm blankets given to pt and wife.

## 2011-02-16 NOTE — ED Notes (Signed)
Another cup of coffee given to pt's wife

## 2011-02-16 NOTE — ED Notes (Signed)
Cup of coffee given to pt's wife

## 2011-02-16 NOTE — Telephone Encounter (Signed)
New message:  Pt woke up this am with chest discomfort/Shortness of breath/BP is 202/106.  Pt does not have a headache.  Call sent to triage nurse.

## 2011-02-16 NOTE — ED Notes (Signed)
Pt medicated as order with metoprolol 5 mg IVP and 25 mg po, pt started on nitro drip at 3 mcg/H, pt states he feels better rates pain 3/10

## 2011-02-16 NOTE — Progress Notes (Signed)
ANTICOAGULATION CONSULT NOTE - Follow Up Consult  Pharmacy Consult for heparin Indication: ACS/NSTEMI  Allergies  Allergen Reactions  . Oxycodone Hcl   . Prednisone     Patient Measurements: Height: 6\' 1"  (185.4 cm) Weight: 195 lb 8.8 oz (88.7 kg) IBW/kg (Calculated) : 79.9   Vital Signs: Temp: 97.6 F (36.4 C) (11/20 2000) Temp src: Oral (11/20 2000) BP: 140/94 mmHg (11/20 2130) Pulse Rate: 83  (11/20 2130)  Labs:  Basename 02/16/11 2157 02/16/11 1455 02/16/11 1250 02/16/11 1044  HGB -- -- -- 10.0*  HCT -- -- -- 30.1*  PLT -- -- -- 117*  APTT -- -- -- --  LABPROT -- -- 14.9 --  INR -- -- 1.15 --  HEPARINUNFRC 0.45 -- -- --  CREATININE -- -- -- 2.26*  CKTOTAL -- 158 -- --  CKMB -- 8.2* -- --  TROPONINI -- 1.91* -- --   Estimated Creatinine Clearance: 25 ml/min (by C-G formula based on Cr of 2.26).   Medications:  Prescriptions prior to admission  Medication Sig Dispense Refill  . acetaminophen (TYLENOL) 325 MG tablet Take 650 mg by mouth as needed. pain      . amLODipine (NORVASC) 5 MG tablet        . cloNIDine (CATAPRES) 0.1 MG tablet Take 0.1 mg by mouth daily.        . furosemide (LASIX) 20 MG tablet Take 20 mg by mouth daily.        . hydrALAZINE (APRESOLINE) 10 MG tablet Take 10 mg by mouth daily.       . potassium chloride (KLOR-CON) 10 MEQ CR tablet Take 10 mEq by mouth daily.        Marland Kitchen lisinopril (PRINIVIL,ZESTRIL) 2.5 MG tablet Take 2.5 mg by mouth daily. Patient was to start taking today.       . Tamsulosin HCl (FLOMAX) 0.4 MG CAPS Take 0.4 mg by mouth at bedtime.         Assessment: Pt started on heparin for ACS/NSTEMI. No bleeding noted. Heparin level 0.45, which is at goal.    Goal of Therapy:  Heparin level 0.3-0.7 units/ml   Plan:  No change to current heparin drip rate F/u AM heparin level  Lillan Mccreadie, Drake Leach 02/16/2011,10:41 PM

## 2011-02-17 ENCOUNTER — Encounter: Payer: Medicare Other | Admitting: Cardiology

## 2011-02-17 DIAGNOSIS — I517 Cardiomegaly: Secondary | ICD-10-CM

## 2011-02-17 LAB — CBC
HCT: 27.1 % — ABNORMAL LOW (ref 39.0–52.0)
MCH: 32.1 pg (ref 26.0–34.0)
MCV: 97.8 fL (ref 78.0–100.0)
RBC: 2.77 MIL/uL — ABNORMAL LOW (ref 4.22–5.81)
RDW: 15.8 % — ABNORMAL HIGH (ref 11.5–15.5)
WBC: 3.1 10*3/uL — ABNORMAL LOW (ref 4.0–10.5)

## 2011-02-17 LAB — BASIC METABOLIC PANEL
BUN: 41 mg/dL — ABNORMAL HIGH (ref 6–23)
CO2: 22 mEq/L (ref 19–32)
Chloride: 106 mEq/L (ref 96–112)
Creatinine, Ser: 2.7 mg/dL — ABNORMAL HIGH (ref 0.50–1.35)

## 2011-02-17 LAB — LIPID PANEL
Cholesterol: 144 mg/dL (ref 0–200)
Total CHOL/HDL Ratio: 2.4 RATIO
Triglycerides: 88 mg/dL (ref <150)
VLDL: 18 mg/dL (ref 0–40)

## 2011-02-17 LAB — CARDIAC PANEL(CRET KIN+CKTOT+MB+TROPI): Total CK: 121 U/L (ref 7–232)

## 2011-02-17 MED ORDER — SODIUM CHLORIDE 0.9 % IV SOLN
INTRAVENOUS | Status: DC
  Administered 2011-02-17: via INTRAVENOUS

## 2011-02-17 NOTE — Progress Notes (Signed)
*  PRELIMINARY RESULTS* Echocardiogram 2D Echocardiogram has been performed.  Glean Salen [facility] RDCS 02/17/2011, 10:38 AM

## 2011-02-17 NOTE — Progress Notes (Signed)
Subjective:    Pt was admitted with chest pain / dyspnea.  Enzymes are elevated.  Also was found to have new onset Atrial Fibrillation.  11/21 - He has been pain free overnight on low dose NTG.  His HR has been well controlled with Metoprolol.      Marland Kitchen aspirin  324 mg Oral Once  . aspirin EC  81 mg Oral Daily  . cloNIDine  0.1 mg Oral Daily  . furosemide  40 mg Intravenous Once  . furosemide  20 mg Oral Daily  . heparin  4,000 Units Intravenous Once  . heparin  1,200 Units/hr Intravenous Once  . hydrALAZINE  10 mg Oral Daily  . metoprolol  5 mg Intravenous Once  . metoprolol tartrate  25 mg Oral BID  . potassium chloride  10 mEq Oral Daily  . rosuvastatin  10 mg Oral Daily  . sodium chloride  3 mL Intravenous Q12H  . Tamsulosin HCl  0.4 mg Oral QHS  . DISCONTD: nitroGLYCERIN  1 inch Topical Once      . sodium chloride 10 mL/hr at 02/17/11 0025  . heparin 1,200 Units/hr (02/17/11 0306)  . nitroGLYCERIN 3 mcg/min (02/16/11 2200)    Objective:  Vital Signs in the last 24 hours: Blood pressure 100/64, pulse 73, temperature 97.8 F (36.6 C), temperature source Oral, resp. rate 17, height 6\' 1"  (1.854 m), weight 195 lb 12.3 oz (88.8 kg), SpO2 95.00%. Temp:  [97.6 F (36.4 C)-98 F (36.7 C)] 97.8 F (36.6 C) (11/21 0400) Pulse Rate:  [58-115] 73  (11/21 0500) Resp:  [15-27] 17  (11/21 0500) BP: (97-177)/(45-101) 100/64 mmHg (11/21 0400) SpO2:  [90 %-100 %] 95 % (11/21 0500) Weight:  [195 lb 8.8 oz (88.7 kg)-204 lb (92.534 kg)] 195 lb 12.3 oz (88.8 kg) (11/21 0400)  Intake/Output from previous day: 11/20 0701 - 11/21 0700 In: 299.1 [P.O.:120; I.V.:179.1] Out: 1550 [Urine:1550] Intake/Output from this shift:    Physical Exam: The patient is alert and oriented x 3.  The mood and affect are normal.   Skin: warm and dry.  Color is normal.    HEENT:   the sclera are nonicteric.  The mucous membranes are moist.  The carotids are 2+ without bruits.  There is no  thyromegaly.  There is no JVD.    Lungs: clear.  The chest wall is non tender.    Heart: irrregular rate with a normal S1 and S2.  There are no murmurs, gallops, or rubs. The PMI is not displaced.     Abdomen: good bowel sounds.  There is no guarding or rebound.  There is no hepatosplenomegaly or tenderness.  There are no masses.   Extremities:  no clubbing, cyanosis, or edema.  The legs are without rashes.  The distal pulses are intact.   Neuro:  Cranial nerves II - XII are intact.  Motor and sensory functions are intact.     Lab Results:  [facility] 02/16/11 1044  WBC 5.3  HGB 10.0*  PLT 117*    Basename 02/16/11 1044  NA 137  K 4.5  CL 105  CO2 24  GLUCOSE 116*  BUN 38*  CREATININE 2.26*    Basename 02/16/11 2157 02/16/11 1455  TROPONINI 2.00* 1.91*   No results found for this basename: BNP in the last 72 hours Hepatic Function Panel No results found for this basename: PROT,ALBUMIN,AST,ALT,ALKPHOS,BILITOT,BILIDIR,IBILI in the last 72 hours No results found for this basename: CHOL, HDL, LDLCALC, LDLDIRECT, TRIG, CHOLHDL  Basename 02/16/11 1250  INR 1.15    Tele: A-Fib  Assessment/Plan:    1. CAD / NSTEMI  :  He's feeling better.   We will try to treat this MI medically - continue heparin, lopressor and NTG for now.  Cath would be risky from a renal standpoint. Echo today  2. Atrial fibrillation:   Rate is well controlled on Metoprolol.  Continue.  Will need coumadin / pradaxa/ xarelto   3.  Essential hypertension:  Will controlled    Vesta Mixer, Montez Hageman., MD, [facility] 02/17/2011, 7:11 AM

## 2011-02-17 NOTE — Progress Notes (Signed)
ANTICOAGULATION CONSULT NOTE - Follow Up Consult  Pharmacy Consult for Heparin                               Indication: ACS/NSTEMI  ** Considering patients renal function, The [employer] states Lenna Sciara is contraindicated in patients with severe renal impairment (< 46ml/min) [place], 2012).  ** Clinical trials evaluating safety and efficacy of Rivaroxaban using Cockcroft-Gault formula with actual body weight [name], 2012) and for all approved indications, Rivaroxaban is listed as "avoid use" for clearance <44ml/min, especially in age > 75yo.   Allergies  Allergen Reactions  . Oxycodone Hcl   . Prednisone     Patient Measurements: Height: 6\' 1"  (185.4 cm) Weight: 195 lb 12.3 oz (88.8 kg) IBW/kg (Calculated) : 79.9   Vital Signs: Temp: 97.8 F (36.6 C) (11/21 0400) Temp src: Oral (11/21 0400) BP: 100/64 mmHg (11/21 0400) Pulse Rate: 73  (11/21 0500)  Labs:  Basename 02/17/11 0730 02/16/11 2157 02/16/11 1455 02/16/11 1250 02/16/11 1044  HGB 8.9* -- -- -- 10.0*  HCT 27.1* -- -- -- 30.1*  PLT PENDING -- -- -- 117*  APTT -- -- -- -- --  LABPROT -- -- -- 14.9 --  INR -- -- -- 1.15 --  HEPARINUNFRC 0.37 0.45 -- -- --  CREATININE -- -- -- -- 2.26*  CKTOTAL -- 147 158 -- --  CKMB -- 8.4* 8.2* -- --  TROPONINI -- 2.00* 1.91* -- --   Estimated Creatinine Clearance: 25 ml/min (by C-G formula based on Cr of 2.26).   Medications:  Prescriptions prior to admission  Medication Sig Dispense Refill  . acetaminophen (TYLENOL) 325 MG tablet Take 650 mg by mouth as needed. pain      . amLODipine (NORVASC) 5 MG tablet        . cloNIDine (CATAPRES) 0.1 MG tablet Take 0.1 mg by mouth daily.        . furosemide (LASIX) 20 MG tablet Take 20 mg by mouth daily.        . hydrALAZINE (APRESOLINE) 10 MG tablet Take 10 mg by mouth daily.       . potassium chloride (KLOR-CON) 10 MEQ CR tablet Take 10 mEq by mouth daily.        Marland Kitchen lisinopril  (PRINIVIL,ZESTRIL) 2.5 MG tablet Take 2.5 mg by mouth daily. Patient was to start taking today.       . Tamsulosin HCl (FLOMAX) 0.4 MG CAPS Take 0.4 mg by mouth at bedtime.         Assessment: Pt started on heparin for ACS/NSTEMI. No bleeding noted. Heparin level 0.37, which is within desired goal range.    Noted plans to add Warfarin/Pradaxa/Xarelto.   Goal of Therapy:  Heparin level 0.3-0.7 units/ml   Plan:  No change to current heparin drip rate. F/u AM heparin level.  ** Considering patients renal function, [employer] states Lenna Sciara is contraindicated in patients with severe renal impairment (< 41ml/min) [place], 2012).  ** Clinical trials evaluating safety and efficacy of Rivaroxaban using Cockcroft-Gault formula with actual body weight [name], 2012) and for all approved indications, Rivaroxaban is listed as "avoid use" for clearance <33ml/min, especially in age > 75yo.  [name] [place] 02/17/2011,8:23 AM

## 2011-02-18 LAB — CBC
MCH: 32.4 pg (ref 26.0–34.0)
MCHC: 33.3 g/dL (ref 30.0–36.0)
MCV: 97.3 fL (ref 78.0–100.0)
Platelets: 109 10*3/uL — ABNORMAL LOW (ref 150–400)
RBC: 2.99 MIL/uL — ABNORMAL LOW (ref 4.22–5.81)
RDW: 15.8 % — ABNORMAL HIGH (ref 11.5–15.5)

## 2011-02-18 MED ORDER — AMLODIPINE BESYLATE 5 MG PO TABS
7.5000 mg | ORAL_TABLET | Freq: Every day | ORAL | Status: DC
  Administered 2011-02-18 – 2011-02-22 (×5): 7.5 mg via ORAL
  Filled 2011-02-18 (×6): qty 1

## 2011-02-18 MED ORDER — HEPARIN BOLUS VIA INFUSION
2500.0000 [IU] | Freq: Once | INTRAVENOUS | Status: AC
  Administered 2011-02-18: 2500 [IU] via INTRAVENOUS
  Filled 2011-02-18: qty 2500

## 2011-02-18 MED ORDER — ISOSORBIDE MONONITRATE ER 60 MG PO TB24
60.0000 mg | ORAL_TABLET | Freq: Every day | ORAL | Status: DC
  Administered 2011-02-18 – 2011-02-19 (×2): 60 mg via ORAL
  Filled 2011-02-18 (×2): qty 1

## 2011-02-18 MED ORDER — METOPROLOL TARTRATE 50 MG PO TABS
50.0000 mg | ORAL_TABLET | Freq: Two times a day (BID) | ORAL | Status: DC
  Administered 2011-02-18 – 2011-02-22 (×9): 50 mg via ORAL
  Filled 2011-02-18 (×11): qty 1

## 2011-02-18 NOTE — Progress Notes (Signed)
MEDICATION RELATED CONSULT NOTE - FOLLOW UP   Pharmacy Consult for:         Evaluation of Warfarin vs. Antithrombin Agents Indication: Afib  Labs: CREATININE -- 2.70* (today) -- 2.26*   Estimated Creatinine Clearance: 21 ml/min (by C-G formula based on Cr of 2.7).  ** Considering patients renal function:  Xarelto- Rivaroxaban (Package Insert) 5.4 Use in Patients with Renal Impairment Nonvalvular Atrial Fibrillation  Avoid the use of XARELTO in patients with CrCl <15 mL/min since drug exposure is increased. Periodically assess renal function as clinically indicated (i.e., more frequently in situations in which renal function may decline) and adjust therapy accordingly. Discontinue XARELTO in patients who develop acute renal failure while on XARELTO [see Use in Specific Populations (8.7)].  o For patients with CrCl 15 - 50 mL/min: 15 mg orally, once daily with the evening meal (2.3)  Pradaxa - Dabigatran (Package Insert) Nonvalvular Atrial Fibrillation  Recommended Dose For patients with creatinine clearance (CrCl) >30 mL/min, the recommended dose of PRADAXA is 150 mg taken orally, twice daily, with or without food. For patients with severe renal impairment (CrCl 15-30 mL/min), the recommended dose of PRADAXA is 75 mg twice daily [see Use in Specific Populations (8.6) and Clinical Pharmacology (12.3)]. Dosing recommendations for patients with a CrCl <15 mL/min or on dialysis cannot be provided.   For patients with CrCl 15-30 ml/min: 75 mg twice daily.   Risk benefit must be considered with the anti-thrombin agents and Warfarin therapy.  Both anti-thrombin agents should be stopped for acute renal failure or creatinine clearance < 15 ml/min.  The decision could be made based on the plan for follow-up of his renal function (monthly?).  Warfarin is an alternative as well which is not cleared renally but requires continuous monitoring.  Hope this provides you the information you need.   Please let me know if I can assist further.     Chinita Greenland, PharmD.

## 2011-02-18 NOTE — Progress Notes (Signed)
ANTICOAGULATION CONSULT NOTE - Follow Up Consult  Pharmacy Consult for Heparin Indication: NSTEMI/Afib  Allergies  Allergen Reactions  . Oxycodone Hcl   . Prednisone     Patient Measurements: Height: 6\' 1"  (185.4 cm) Weight: 195 lb 15.8 oz (88.9 kg) IBW/kg (Calculated) : 79.9   Vital Signs: Temp: 97.8 F (36.6 C) (11/22 1545) Temp src: Oral (11/22 1545) BP: 125/76 mmHg (11/22 1654) Pulse Rate: 71  (11/22 1545)  Labs:  Basename 02/18/11 1632 02/18/11 0630 02/17/11 0730 02/16/11 2157 02/16/11 1455 02/16/11 1250 02/16/11 1044  HGB -- 9.7* 8.9* -- -- -- --  HCT -- 29.1* 27.1* -- -- -- 30.1*  PLT -- 109* 103* -- -- -- 117*  APTT -- -- -- -- -- -- --  LABPROT -- -- -- -- -- 14.9 --  INR -- -- -- -- -- 1.15 --  HEPARINUNFRC 0.17* 0.14* 0.37 -- -- -- --  CREATININE -- -- 2.70* -- -- -- 2.26*  CKTOTAL -- -- 121 147 158 -- --  CKMB -- -- 7.7* 8.4* 8.2* -- --  TROPONINI -- -- 2.25* 2.00* 1.91* -- --   Estimated Creatinine Clearance: 21 ml/min (by C-G formula based on Cr of 2.7).   Medications:  Heparin at 1400 units/hr  Assessment: 89yom continues on heparin for NSTEMI/Afib. Heparin level remains subtherapeutic and essentially unchanged despite previous rate increase.  Goal of Therapy:  Heparin level 0.3-0.7 units/ml   Plan:  1) Small heparin bolus 2500 units x 1 2) Increase heparin drip to 1600 units/hr 3) 8 hour heparin level  Fredrik Rigger 02/18/2011,5:40 PM

## 2011-02-18 NOTE — Progress Notes (Signed)
Patient ID: Tracy [name], male   DOB: Aug 24, 1919, 77 y.o.   MRN: [id number] SUBJECTIVE: No chest pain since yesterday except chest Chad Tiznado pain. Nothing like on presentation. No palpitations, SOB. No history of bleeding diathesis.  Filed Vitals:   02/17/11 2000 02/17/11 2300 02/18/11 0000 02/18/11 0400  BP: 156/94  122/77 131/74  Pulse: 81 82 85 76  Temp: 98.1 F (36.7 C)  98.2 F (36.8 C) 97.7 F (36.5 C)  TempSrc:   Oral Oral  Resp: 23  20 14   Height:      Weight:    88.9 kg (195 lb 15.8 oz)  SpO2: 93% 95% 94% 95%    Intake/Output Summary (Last 24 hours) at 02/18/11 0804 Last data filed at 02/18/11 0700  Gross per 24 hour  Intake 1423.8 ml  Output   1875 ml  Net -451.2 ml    LABS: Basic Metabolic Panel:  Basename 02/17/11 0730 02/16/11 1455 02/16/11 1044  NA 137 -- 137  K 4.7 -- 4.5  CL 106 -- 105  CO2 22 -- 24  GLUCOSE 117* -- 116*  BUN 41* -- 38*  CREATININE 2.70* -- 2.26*  CALCIUM 8.8 -- 9.1  MG -- 1.8 --  PHOS -- -- --   Liver Function Tests: No results found for this basename: AST:2,ALT:2,ALKPHOS:2,BILITOT:2,PROT:2,ALBUMIN:2 in the last 72 hours No results found for this basename: LIPASE:2,AMYLASE:2 in the last 72 hours CBC:  Basename 02/18/11 0630 02/17/11 0730 02/16/11 1044  WBC 3.5* 3.1* --  NEUTROABS -- -- 4.4  HGB 9.7* 8.9* --  HCT 29.1* 27.1* --  MCV 97.3 97.8 --  PLT 109* 103* --   Cardiac Enzymes:  Basename 02/17/11 0730 02/16/11 2157 02/16/11 1455  CKTOTAL 121 147 158  CKMB 7.7* 8.4* 8.2*  CKMBINDEX -- -- --  TROPONINI 2.25* 2.00* 1.91*   BNP:  Basename 02/16/11 1044  POCBNP 3945.0*   D-Dimer: No results found for this basename: DDIMER:2 in the last 72 hours Hemoglobin A1C: No results found for this basename: HGBA1C in the last 72 hours Fasting Lipid Panel:  Basename 02/17/11 0730  CHOL 144  HDL 59  LDLCALC 67  TRIG 88  CHOLHDL 2.4  LDLDIRECT --   Thyroid Function Tests:  Basename 02/16/11 1455  TSH 0.685  T4TOTAL --    T3FREE --  THYROIDAB --   Anemia Panel: No results found for this basename: VITAMINB12,FOLATE,FERRITIN,TIBC,IRON,RETICCTPCT in the last 72 hours RADIOLOGY: Dg Chest 2 View  02/16/2011  *RADIOLOGY REPORT*  Clinical Data: Shortness of breath.  Possible congestive heart failure.  CHEST - 2 VIEW  Comparison: 04/02/2009.  Findings: The patient has developed diffuse bilateral perihilar and bibasilar interstitial accentuation consistent with diffuse interstitial pulmonary edema.  There are also small bilateral pleural effusions.  The heart is borderline in size.  The aorta is elongated ectatic.  There is no evidence for mediastinal or hilar adenopathy.  IMPRESSION: Interval development of diffuse interstitial pulmonary edema.  Original Report Authenticated By: Rolla Plate, M.D.   US Aorta  01/25/2011  *RADIOLOGY REPORT*  Clinical Data:  Possible abdominal aneurysm identified on XRAY.  ULTRASOUND OF ABDOMINAL AORTA  Technique:  Ultrasound examination of the abdominal aorta was performed to evaluate for abdominal aortic aneurysm.  Comparison: MRI of the lumbar spine of 02/27/2010. Outside plain film of 01/22/2011.  Abdominal Aorta:  No aneurysm identified.        Maximum AP diameter:  2.7 cm cm.       Maximum TRV diameter:  2.8  cm cm.  The proximal most aspect of the right common iliac artery measures up to 1.4 cm.  The proximal aspect of the left common iliac artery measures up to 1.6 cm.  IMPRESSION:  1.  No evidence of abdominal aortic aneurysm. 2.  Borderline right and mild left common iliac artery ectasia.  Original Report Authenticated By: Consuello Bossier, M.D.  Echo shows EF OF 55%, biatrial enlargement, mild RV systolic dysfunction EKG today unchanged except for IRBBB PHYSICAL EXAM General: Well developed, well nourished, in no acute distress Head: Eyes PERRLA, No xanthomas.   Normal cephalic and atramatic  Lungs: Clear bilaterally to auscultation and percussion. Heart: Irregular rate and  rhythm, S1 S2, with soft S4 murmur.  Pulses are 2+ & equal.            Soft carotid bruits. No JVD.  No abdominal bruits. No femoral bruits. Abdomen: Bowel sounds are positive, abdomen soft and non-tender without masses or                  Hernia's noted. Msk:  Back normal, normal gait. Normal strength and tone for age. Extremities: No clubbing, cyanosis or edema.  DP +1 Neuro: Alert and oriented X 3. Psych:  Good affect, responds appropriately  TELEMETRY: Reviewed telemetry pt in Afib with controlled VR  ASSESSMENT AND PLAN:  Principal Problem:  *NSTEMI (non-ST elevated myocardial infarction) Active Problems:  HYPERLIPIDEMIA  Essential hypertension, benign  CAD  CARDIOMYOPATHY  RENAL INSUFFICIENCY  Atrial fibrillation   He is stable and currently asymptomatic. Will initially treat medically with cath only if refractory pain with CKD and age. Will change IV NTG to isosorbide mononitrate 60mg  qam, increase Metoprolol for RHR of 60/min. Will initiate OP anticoagulation and will use antithrombin if he can afford. Will ask Pharm D to address. Transfer to telemetry.  Valera Castle, MD 02/18/2011 8:04 AM

## 2011-02-18 NOTE — Progress Notes (Signed)
ANTICOAGULATION CONSULT NOTE - Follow Up Consult  Pharmacy Consult for Heparin                               Indication: ACS/NSTEMI  ** Considering patients renal function, The [employer] states Tracy [name] is contraindicated in patients with severe renal impairment (< 61ml/min) Tracy [name], 2012).  ** Clinical trials evaluating safety and efficacy of Rivaroxaban using Cockcroft-Gault formula with actual body weight Tracy [name], 2012) and for all approved indications, Rivaroxaban is listed as "avoid use" for clearance <58ml/min, especially in age > 75yo.   Allergies  Allergen Reactions  . Oxycodone Hcl   . Prednisone     Patient Measurements: Height: 6\' 1"  (185.4 cm) Weight: 195 lb 15.8 oz (88.9 kg) IBW/kg (Calculated) : 79.9   Vital Signs: Temp: 97.7 F (36.5 C) (11/22 0400) Temp src: Oral (11/22 0400) BP: 131/74 mmHg (11/22 0400) Pulse Rate: 76  (11/22 0400)  Labs:  Basename 02/18/11 0630 02/17/11 0730 02/16/11 2157 02/16/11 1455 02/16/11 1250 02/16/11 1044  HGB 9.7* 8.9* -- -- -- --  HCT 29.1* 27.1* -- -- -- 30.1*  PLT 109* 103* -- -- -- 117*  APTT -- -- -- -- -- --  LABPROT -- -- -- -- 14.9 --  INR -- -- -- -- 1.15 --  HEPARINUNFRC 0.14* 0.37 0.45 -- -- --  CREATININE -- 2.70* -- -- -- 2.26*  CKTOTAL -- 121 147 158 -- --  CKMB -- 7.7* 8.4* 8.2* -- --  TROPONINI -- 2.25* 2.00* 1.91* -- --   Estimated Creatinine Clearance: 21 ml/min (by C-G formula based on Cr of 2.7).   Medications:  Prescriptions prior to admission  Medication Sig Dispense Refill  . acetaminophen (TYLENOL) 325 MG tablet Take 650 mg by mouth as needed. pain      . amLODipine (NORVASC) 5 MG tablet        . cloNIDine (CATAPRES) 0.1 MG tablet Take 0.1 mg by mouth daily.        . furosemide (LASIX) 20 MG tablet Take 20 mg by mouth daily.        . hydrALAZINE (APRESOLINE) 10 MG tablet Take 10 mg by mouth daily.       . potassium chloride (KLOR-CON) 10 MEQ CR  tablet Take 10 mEq by mouth daily.        Marland Kitchen lisinopril (PRINIVIL,ZESTRIL) 2.5 MG tablet Take 2.5 mg by mouth daily. Patient was to start taking today.       . Tamsulosin HCl (FLOMAX) 0.4 MG CAPS Take 0.4 mg by mouth at bedtime.         Assessment: Pt started on heparin for ACS/NSTEMI. No bleeding noted. Heparin level 0.14, which is subtherapeutic.    Spoke with nurse, no problems with pump or drip noted.  Plans to add Warfarin/Pradaxa/Xarelto.   Goal of Therapy:  Heparin level 0.3-0.7 units/ml   Plan:  Increase IV heparin to 1400 units/hr. F/u 8 hour heparin level.  Tracy [name] 02/18/2011,7:55 AM

## 2011-02-19 ENCOUNTER — Encounter: Payer: Medicare Other | Admitting: *Deleted

## 2011-02-19 LAB — URINE CULTURE: Colony Count: 10000

## 2011-02-19 LAB — CBC
MCH: 32.2 pg (ref 26.0–34.0)
MCV: 97.7 fL (ref 78.0–100.0)
Platelets: 105 10*3/uL — ABNORMAL LOW (ref 150–400)
RBC: 2.64 MIL/uL — ABNORMAL LOW (ref 4.22–5.81)

## 2011-02-19 MED ORDER — ISOSORBIDE MONONITRATE ER 30 MG PO TB24
30.0000 mg | ORAL_TABLET | Freq: Every day | ORAL | Status: DC
  Administered 2011-02-20 – 2011-02-22 (×3): 30 mg via ORAL
  Filled 2011-02-19 (×3): qty 1

## 2011-02-19 NOTE — Progress Notes (Signed)
ANTICOAGULATION CONSULT NOTE - Follow Up Consult  Pharmacy Consult for Heparin Indication: NSTEMI/Afib  Allergies  Allergen Reactions  . Oxycodone Hcl   . Prednisone     Patient Measurements: Height: 6\' 1"  (185.4 cm) Weight: 195 lb 15.8 oz (88.9 kg) IBW/kg (Calculated) : 79.9   Vital Signs: Temp: 98 F (36.7 C) (11/22 2135) Temp src: Oral (11/22 2135) BP: 136/89 mmHg (11/22 2135) Pulse Rate: 74  (11/22 2135)  Labs:  Basename 02/19/11 0228 02/18/11 1632 02/18/11 0630 02/17/11 0730 02/16/11 2157 02/16/11 1455 02/16/11 1250 02/16/11 1044  HGB 8.5* -- 9.7* -- -- -- -- --  HCT 25.8* -- 29.1* 27.1* -- -- -- --  PLT 105* -- 109* 103* -- -- -- --  APTT -- -- -- -- -- -- -- --  LABPROT -- -- -- -- -- -- 14.9 --  INR -- -- -- -- -- -- 1.15 --  HEPARINUNFRC 0.23* 0.17* 0.14* -- -- -- -- --  CREATININE -- -- -- 2.70* -- -- -- 2.26*  CKTOTAL -- -- -- 121 147 158 -- --  CKMB -- -- -- 7.7* 8.4* 8.2* -- --  TROPONINI -- -- -- 2.25* 2.00* 1.91* -- --   Estimated Creatinine Clearance: 21 ml/min (by C-G formula based on Cr of 2.7).  Assessment: 77yo male remains subtherapeutic on heparin despite multiple rate increases.  Goal of Therapy:  Heparin level 0.3-0.7 units/ml   Plan:  Will increase gtt by ~2 units/kg/hr to 1800 units/hr and check level in 8hr.  Colleen Can PharmD BCPS 02/19/2011,4:45 AM

## 2011-02-19 NOTE — Progress Notes (Addendum)
Patient Name: Tracy [name] 02/19/2011 8:48 AM    Principal Problem:  *NSTEMI (non-ST elevated myocardial infarction) Active Problems:  HYPERLIPIDEMIA  Essential hypertension, benign  CAD  CARDIOMYOPATHY  RENAL INSUFFICIENCY  Atrial fibrillation    SUBJECTIVE: NO overnight events. Pt feeling well overall. No further chest pain. Some mild DOE with ambulation yesterday, but thinks breathing is back to baseline.    OBJECTIVE  Temp:  [97.4 F (36.3 C)-98 F (36.7 C)] 97.7 F (36.5 C) (11/23 0505) Pulse Rate:  [65-89] 65  (11/23 0505) Resp:  [18-24] 18  (11/23 0505) BP: (105-160)/(67-89) 121/67 mmHg (11/23 0505) SpO2:  [94 %-96 %] 94 % (11/23 0505) Weight:  [90.1 kg (198 lb 10.2 oz)] 198 lb 10.2 oz (90.1 kg) (11/23 0500)  Intake/Output Summary (Last 24 hours) at 02/19/11 0848 Last data filed at 02/19/11 0535  Gross per 24 hour  Intake      0 ml  Output   1100 ml  Net  -1100 ml   Weight change: 1.2 kg (2 lb 10.3 oz)  PHYSICAL EXAM  General: Pleasant, elderly white male, in no acute distress. Head: Normocephalic, atraumatic, sclera non-icteric, no xanthomas, nares are without discharge.  Neck: Supple without JVD. Lungs:  Resp regular and unlabored, CTAB. Heart: Irregular rhythm, no murmurs, rubs or gallops Abdomen: Soft, non-tender, non-distended, BS + x 4.  Extremities: No clubbing, cyanosis or edema. DP/PT/Radials 2+ and equal bilaterally. Neuro: Alert and oriented X 3. Moves all extremities spontaneously. Psych: Normal affect.  LABS: CBC: Basename 02/19/11 0228 02/18/11 0630  WBC 2.8* 3.5*  HGB 8.5* 9.7*  HCT 25.8* 29.1*  MCV 97.7 97.3  PLT 105* 109*    Basic Metabolic Panel: Basename 02/17/11 0730 02/16/11 1455 02/16/11 1044  NA 137 -- 137  K 4.7 -- 4.5  CL 106 -- 105  CO2 22 -- 24  GLUCOSE 117* -- 116*  BUN 41* -- 38*  CREATININE 2.70* -- 2.26*  CALCIUM 8.8 -- 9.1  MG -- 1.8 --   Cardiac Enzymes:  Basename 02/17/11 0730 02/16/11 2157  02/16/11 1455  CKTOTAL 121 147 158  CKMB 7.7* 8.4* 8.2*  TROPONINI 2.25* 2.00* 1.91*   BNP: Basename 02/16/11 1044  POCBNP 3945.0*   Coagulation Studies: Basename 02/16/11 1250  LABPROT 14.9  INR 1.15   Fasting Lipid Panel: Basename 02/17/11 0730  CHOL 144  HDL 59  LDLCALC 67  TRIG 88  CHOLHDL 2.4   Thyroid Function Tests: Basename 02/16/11 1455  TSH 0.685   TELE: Atrial fibrillation w/ controlled VR, 60s-80s  Radiology/Studies:  Dg Chest 2 View  02/16/2011  Findings: The patient has developed diffuse bilateral perihilar and bibasilar interstitial accentuation consistent with diffuse interstitial pulmonary edema.  There are also small bilateral pleural effusions.  The heart is borderline in size.  The aorta is elongated ectatic.  There is no evidence for mediastinal or hilar adenopathy.  IMPRESSION: Interval development of diffuse interstitial pulmonary edema.    Inpatient Medications:   . amLODipine  7.5 mg Oral Daily  . aspirin EC  81 mg Oral Daily  . cloNIDine  0.1 mg Oral Daily  . furosemide  20 mg Oral Daily  . heparin  2,500 Units Intravenous Once  . hydrALAZINE  10 mg Oral Daily  . isosorbide mononitrate  60 mg Oral Daily  . metoprolol tartrate  50 mg Oral BID  . potassium chloride  10 mEq Oral Daily  . rosuvastatin  10 mg Oral Daily  . Tamsulosin HCl  0.4 mg Oral QHS     ASSESSMENT AND PLAN:  89yom w/ PMHx CAD (s/p ?BMS OM '97, BMS mid & distal RCA '01), HTN, HLD, & Stage 3 CKD who was admitted on 02/16/11 w/ NSTEMI and new onset A.fib.   1. CAD/NSTEMI: Patient has been medically managed with heparin and nitro drips secondary to his poor renal function. He was transitioned to Isosorbide Mononitrate yesterday and is now chest pain free for > 24hrs. The patient reports that the Imdur 60mg  dose made him weak and dropped his blood pressure. Consider decreasing dose to 30mg  daily and increase as tolerated. Continue ASA, BB, and statin.   2. Atrial  Fibrillation: He has remained in atrial fibrillation, but has been rate controlled with metoprolol 50mg  BID. His CHADS2 score is 3 and will therefore need outpatient anticoagulation, Coumadin   3. Dyspnea: His CXR revealed diffuse interstitial pulmonary edema and his BNP was elevated on admission. He was diuresed with a one time dose of 40mg  IV Lasix and transitioned to his home oral dose. His Echo on 02/17/11 demonstrated normal LV systolic function with an EF of 50-55%, severe LVH, asymmetric hypertrophy and no regional wall motion abnormalities. His shortness of breath is markedly improved. Continue home oral Lasix dose.  4. HTN: Stable on home med regimen.   5. HLD: Lipid Panel revealed LDL 67, HLD 59, with total cholesterol 144. Continue statin.  6. CKD: Creatinine has remained stable/at his baseline. Consider adding home lisinopril at discharge.  Signed, HOPE, JESSICA , PA-C  I have seen, examined the patient, and reviewed the above assessment and plan.  He presents with NTEMI.  Now doing very well with medical therapy.  I would recommend medical therapy going forward.  We will stop IV heparin today. He has persistent atrial fibrillation and is well rate controlled. He is clearly not a candidate for pradaxa or xarelto given his advanced age and renal failure.  In the setting of pancytopenia, I am also reluctant to use coumadin presently. I would recommend outpatient consideration of coumadin after anemia has been adequately evaluated.  At this time, I will stop heparin gtt. Ambulate and observe overnight.  If renal function and hematocrit remain stable, will DC home tomorrow.  Co Sign: Hillis Range, MD 02/19/2011 11:05 AM

## 2011-02-20 LAB — CBC
HCT: 25.2 % — ABNORMAL LOW (ref 39.0–52.0)
MCH: 31.5 pg (ref 26.0–34.0)
MCHC: 31.7 g/dL (ref 30.0–36.0)
MCHC: 31.9 g/dL (ref 30.0–36.0)
MCV: 99.2 fL (ref 78.0–100.0)
Platelets: 118 10*3/uL — ABNORMAL LOW (ref 150–400)
RDW: 15.8 % — ABNORMAL HIGH (ref 11.5–15.5)
RDW: 15.9 % — ABNORMAL HIGH (ref 11.5–15.5)

## 2011-02-20 LAB — BASIC METABOLIC PANEL
BUN: 53 mg/dL — ABNORMAL HIGH (ref 6–23)
Calcium: 9.1 mg/dL (ref 8.4–10.5)
Chloride: 106 mEq/L (ref 96–112)
Creatinine, Ser: 2.9 mg/dL — ABNORMAL HIGH (ref 0.50–1.35)
GFR calc Af Amer: 21 mL/min — ABNORMAL LOW (ref >90)
GFR calc non Af Amer: 17 mL/min — ABNORMAL LOW (ref >90)
Sodium: 134 mEq/L — ABNORMAL LOW (ref 135–145)

## 2011-02-20 LAB — TYPE AND SCREEN
ABO/RH(D): O NEG
Antibody Screen: NEGATIVE

## 2011-02-20 MED ORDER — SODIUM CHLORIDE 0.9 % IV SOLN: INTRAVENOUS | Status: AC

## 2011-02-20 NOTE — Progress Notes (Signed)
Subjective:  Delightful gentleman, full of quotes.  Looks younger than stated age.  He feels fine.  No chest pain.  Able to get up and wants to shower.  Would like to go home  (lives with wife), but I apprised him of his laboratory studies today, and need for additional observation.    Objective:  Vital Signs in the last 24 hours: Temp:  [98 F (36.7 C)-98.1 F (36.7 C)] 98 F (36.7 C) (11/24 0517) Pulse Rate:  [62-78] 78  (11/24 0517) Resp:  [20] 20  (11/24 0517) BP: (111-149)/(56-85) 128/56 mmHg (11/24 0517) SpO2:  [95 %-97 %] 95 % (11/24 0517) Weight:  [90.9 kg (200 lb 6.4 oz)] 200 lb 6.4 oz (90.9 kg) (11/24 0517)  Intake/Output from previous day: 11/23 0701 - 11/24 0700 In: 720 [P.O.:720] Out: -    Physical Exam: General: Well developed, well nourished, in no acute distress. Head:  Normocephalic and atraumatic. Lungs: Clear to auscultation and percussion. Heart: Normal S1 and S2.  Irregular rhythm that is reasonably controlled  Pulses: Pulses normal in all 4 extremities. Extremities: No clubbing or cyanosis. No edema. Neurologic: Alert and oriented x 3.    Lab Results:  Basename 02/20/11 0600 02/19/11 0228  WBC 2.4* 2.8*  HGB 8.0* 8.5*  PLT 100* 105*    Basename 02/20/11 0600  NA 141  K 5.5*  CL 106  CO2 24  GLUCOSE 106*  BUN 53*  CREATININE 2.90*   No results found for this basename: TROPONINI:2,CK,MB:2 in the last 72 hours Hepatic Function Panel No results found for this basename: PROT,ALBUMIN,AST,ALT,ALKPHOS,BILITOT,BILIDIR,IBILI in the last 72 hours No results found for this basename: CHOL in the last 72 hours No results found for this basename: PROTIME in the last 72 hours  Imaging: No results found.      Assessment/Plan:  Patient Active Hospital Problem List: NSTEMI (non-ST elevated myocardial infarction) (02/16/2011)   Assessment: No recurrent chest pain   Plan: Continued observation.  No cath planned for multiple reasons RENAL  INSUFFICIENCY (07/02/2008)   Assessment: BUN and Cr is worse.  Started on furosemide with findings at CXR.  Does not sound wet.     Plan: Hold furosemide, and recheck BMET in am.  Hold K.  Encourage fluids.  Gentle hydration Atrial fibrillation (02/16/2011)   Assessment: Rate controlled at present   Plan: Not felt to be anticoagulation candidate.   Anemia:  Progressive.  Needs to be watched.  Recheck, and type and screen.  Abnormal Urine:  Original UA was neg for wbc.  Therefore, observe.         Shawnie Pons, MD, [facility], FSCAI 02/20/2011, 10:50 AM

## 2011-02-21 ENCOUNTER — Inpatient Hospital Stay (HOSPITAL_COMMUNITY): Payer: Medicare Other

## 2011-02-21 LAB — CBC
MCH: 31.4 pg (ref 26.0–34.0)
MCHC: 31.9 g/dL (ref 30.0–36.0)
Platelets: 111 10*3/uL — ABNORMAL LOW (ref 150–400)

## 2011-02-21 LAB — BASIC METABOLIC PANEL
Calcium: 9 mg/dL (ref 8.4–10.5)
Creatinine, Ser: 2.86 mg/dL — ABNORMAL HIGH (ref 0.50–1.35)
GFR calc non Af Amer: 18 mL/min — ABNORMAL LOW (ref >90)
Glucose, Bld: 104 mg/dL — ABNORMAL HIGH (ref 70–99)
Sodium: 136 mEq/L (ref 135–145)

## 2011-02-21 MED ORDER — HEART ATTACK BOUNCING BOOK
Freq: Once | Status: AC
  Administered 2011-02-21: 05:00:00
  Filled 2011-02-21: qty 1

## 2011-02-21 NOTE — Progress Notes (Signed)
Subjective:  Doing well.  Jumped up when I came in to the room.  Denies shortness of breath.  Objective:  Vital Signs in the last 24 hours: Temp:  [97.3 F (36.3 C)-97.4 F (36.3 C)] 97.4 F (36.3 C) (11/25 0602) Pulse Rate:  [60-74] 65  (11/25 0602) Resp:  [18] 18  (11/25 0602) BP: (106-142)/(68-87) 133/82 mmHg (11/25 0602) SpO2:  [95 %-98 %] 95 % (11/25 0602) Weight:  [91 kg (200 lb 9.9 oz)] 200 lb 9.9 oz (91 kg) (11/25 0602)  Intake/Output from previous day: 11/24 0701 - 11/25 0700 In: 490 [P.O.:240; I.V.:250] Out: 1350 [Urine:1350]   Physical Exam: General: Well developed, well nourished, in no acute distress. Head:  Normocephalic and atraumatic. Lungs: Clear to auscultation and percussion. Heart: Normal S1 and S2. Irregularly irregular.  PMI non displaced Abdomen:  No masses, normal BS, intact Pulses: Pulses normal in all 4 extremities. Extremities: No clubbing or cyanosis. No edema. Neurologic: Alert and oriented x 3.    Lab Results:  Basename 02/21/11 0630 02/20/11 1754  WBC 2.5* 2.5*  HGB 8.6* 8.7*  PLT 111* 118*    Basename 02/21/11 0630 02/20/11 1754  NA 136 134*  K 4.8 4.7  CL 105 102  CO2 21 22  GLUCOSE 104* 135*  BUN 49* 51*  CREATININE 2.86* 3.02*   No results found for this basename: TROPONINI:2,CK,MB:2 in the last 72 hours Hepatic Function Panel No results found for this basename: PROT,ALBUMIN,AST,ALT,ALKPHOS,BILITOT,BILIDIR,IBILI in the last 72 hours No results found for this basename: CHOL in the last 72 hours No results found for this basename: PROTIME in the last 72 hours  Imaging: No results found.      Assessment/Plan:  Patient Active Hospital Problem List: NSTEMI (non-ST elevated myocardial infarction) (02/16/2011)   Assessment: No chest pain.  Doing well   Plan: Continue current medications Essential hypertension, benign (07/02/2008)   Assessment: reasonable control   Plan: Discontinue hydralazine.  Not of benefit at current  dose, and need to simplify.  Not an ACE, so reasonable consideration for future, but doubt he would take four times per day CAD (07/02/2008)   Assessment: See above under NSTEMI   Plan: As above CARDIOMYOPATHY (08/05/2009)   Assessment: Stable.  Not short of breath.  Furosemide on hold secondary to declining renal function.   Plan: Monitor BMET.  Furosemide on hold.  PA and lat CXR today to compare to prior tracing.  RENAL INSUFFICIENCY (07/02/2008)   Assessment: Cr improved with gentle hydration and holding furosemide   Plan: Watch volume as noted and hold furosemide.  Recheck in am ANEMIA:  Watch CBC.   Atrial fibrillation (02/16/2011)   Assessment: rate controlled   Plan: Continue current meds      Shawnie Pons, MD, [facility], FSCAI 02/21/2011, 10:45 AM

## 2011-02-22 ENCOUNTER — Encounter: Payer: Medicare Other | Admitting: Cardiology

## 2011-02-22 LAB — BASIC METABOLIC PANEL
CO2: 24 mEq/L (ref 19–32)
Calcium: 9.2 mg/dL (ref 8.4–10.5)
GFR calc Af Amer: 19 mL/min — ABNORMAL LOW (ref >90)
Sodium: 140 mEq/L (ref 135–145)

## 2011-02-22 LAB — CBC
MCH: 32.4 pg (ref 26.0–34.0)
Platelets: 116 10*3/uL — ABNORMAL LOW (ref 150–400)
RBC: 2.81 MIL/uL — ABNORMAL LOW (ref 4.22–5.81)
RDW: 15.5 % (ref 11.5–15.5)
WBC: 2.8 10*3/uL — ABNORMAL LOW (ref 4.0–10.5)

## 2011-02-22 MED ORDER — ISOSORBIDE MONONITRATE ER 30 MG PO TB24: 30.0000 mg | ORAL_TABLET | Freq: Every day | ORAL | Status: DC

## 2011-02-22 MED ORDER — METOPROLOL TARTRATE 50 MG PO TABS: 50.0000 mg | ORAL_TABLET | Freq: Two times a day (BID) | ORAL | Status: DC

## 2011-02-22 MED ORDER — WARFARIN SODIUM 5 MG PO TABS: 5.0000 mg | ORAL_TABLET | Freq: Every day | ORAL | Status: DC

## 2011-02-22 MED ORDER — ROSUVASTATIN CALCIUM 10 MG PO TABS: 10.0000 mg | ORAL_TABLET | Freq: Every day | ORAL | Status: DC

## 2011-02-22 MED ORDER — NITROGLYCERIN 0.4 MG SL SUBL: 0.4000 mg | SUBLINGUAL_TABLET | SUBLINGUAL | Status: DC | PRN

## 2011-02-22 NOTE — Discharge Summary (Signed)
Patient ID: Tracy [name],  MRN: [id number], DOB/AGE: 1919/08/30 77 y.o.  Admit date: 02/16/2011 Discharge date: 02/22/2011  Primary MD:  [name]  MD Primary Cardiologist:  [name] MD  Discharge Diagnoses: Principal Problem:  * NSTEMI (non-ST elevated myocardial infarction)  * Atrial fibrillation (new onset this admission; initiated on coumadin) Active Problems:  CAD  * s/p ?BMS OM '97, BMS mid & distal RCA '01, Cath '06 small vessel high-grade disease, diffuse   nonobstructive large vessel disease)  * Normal stress nuclear study 5/12 HYPERLIPIDEMIA HYPERTENSION CARDIOMYOPATHY (severe LVH w/ EF 50-55% on 02/17/11 echo) RENAL INSUFFICIENCY STAGE 3  Allergies Allergies  Allergen Reactions  . Oxycodone Hcl   . Prednisone     Procedures:  02/17/11 - 2D Echocardiogram - Left ventricle: The cavity size was normal. Wall thickness was increased in a pattern of severe LVH. There was asymmetric hypertrophy. Systolic function was normal. The estimated ejection fraction was in the range of 50% to 55%. Wall motion was normal; there were no regional wall motion abnormalities. Doppler parameters are consistent with high ventricular filling pressure. - Left atrium: The atrium was mildly dilated. - Right ventricle: The cavity size was mildly dilated. Systolic function was mildly reduced. - Right atrium: The atrium was mildly dilated. - Atrial septum: No defect or patent foramen ovale was identified.   History of Present Illness: Patient is a 77 y.o. male with a PMHx of CAD (s/p ?BMS OM '97, BMS mid & distal RCA '01, Cath '06 small vessel high-grade disease, diffuse nonobstructive large vessel disease; normal stress nuclear study 5/12), HTN, HLD, CKD (Stage 3), and TIA who presened to [facility] on 02/16/11 for evaluation of acute onset chest pain and shortness of breath that began 2 days prior to admission.  Hospital Course:  In the ED his EKG revealed sinus rhythm with ST  depressions in the inferolateral leads and an initial troponin of 2.38. He continued to have intermittent chest pains despite sublingual nitro and was placed on a nitroglycerin drip. CXR revealed diffuse interstitial pulmonary edema, BNP was 3945 and creatinine 2.26. He was given 324mg  ASA, 40mg  IV lasix, and initiated on a heparin drip and a statin.  While in the ED he went into atrial fibrillation with RVR and was given a metoprolol IV bolus followed by oral metoprolol for rate control.  Medical management with IV heparin, nitro, ASA, beta blocker, and statin was determined to be the [place] option for him due to his poor renal function. IV NTG was changed to Imdur at 60mg  initially, but patient didn't tolerate well (felt poor) and this was decreased to 30mg  daily. A 2D echocardiogram was performed on 02/17/11 with results as above.   He remained in atrial fibrillation, but was rate controlled on oral metoprolol that was titrated to 50mg  BID at time of discharge. His TSH was found to be normal. After discussions with patient, it was determined that due to his CHADS2 score of 3 he would be started on Coumadin. He had a guaic negative stool on day of discharge and will have his INR checked at the end of this week. ASA was discontinued due to initiation of Coumadin and his pancytopenia.  During his hospitalization he was found to have pancytopenia with a WBC as low as 2.4, H&H as low as 8.0/10.0, and platelets as low as 100,000. He remained afebrile and without signs or symptoms of bleeding. It was recommended to him that he follow up with his PCP  for further evaluation.  He responded well to IV diuresis and was transitioned to oral Lasix. His volume status remained stable during the remainder of his hospitalization. His Lasix and potassium were discontinued due to worsening renal function. His creatinine was 2.26 on admission and increased to 3.04 by day of discharge. His potassium increased to 5.5 during  admission and was 5.4 at day of discharge. This will be closely monitored as an outpatient, with a BMET to be drawn in the AM and continued holding of lasix and potassium.  Discharge Vitals:  Temp:  [97.7 F (36.5 C)-98 F (36.7 C)] 97.7 F (36.5 C) (11/26 0500) Pulse Rate:  [71-79] 71  (11/26 0500) Resp:  [20] 20  (11/26 0500) BP: (122-158)/(66-95) 122/66 mmHg (11/26 0500) SpO2:  [94 %-97 %] 97 % (11/26 0500) Weight:  [90.855 kg (200 lb 4.8 oz)] 200 lb 4.8 oz (90.855 kg) (11/26 1610)  Weight change: -0.145 kg (-5.1 oz)  Labs:   Lab Results  Component Value Date   WBC 2.8* 02/22/2011   HGB 9.1* 02/22/2011   HCT 27.9* 02/22/2011   MCV 99.3 02/22/2011   PLT 116* 02/22/2011    Lab 02/22/11 0635  NA 140  K 5.4*  CL 107  CO2 24  BUN 52*  CREATININE 3.04*  CALCIUM 9.2  GLUCOSE 106*    02/16/2011 10:44  BNP, POC 3945.0 (H)    02/16/2011 11:17  Troponin i, poc 2.38 (HH)    02/16/2011 14:55 02/16/2011 21:57 02/17/2011 07:30  CK, MB 8.2 (HH) 8.4 (HH) 7.7 (HH)  CK Total 158 147 121  Troponin I 1.91 (HH) 2.00 (HH) 2.25 (HH)     02/17/2011 07:30  Cholesterol 144  Triglycerides 88  HDL 59  LDL (calc) 67  VLDL 18    96/06/5407 14:55  TSH 0.685    Follow-up Information    Follow up with [facility] on 02/23/2011. (Lab (BMET) check in AM; anytime between 8:30-2 or 2:30-4:30)    Contact information:   [address], [address] [place] [address] [phone]      Follow up with [facility] on 02/25/2011. (INR check at 3:45)    Contact information:   [address], [address] [place] [address] [phone]      Follow up with Tereso Newcomer, PA on 03/03/2011. (2:30)    Contact information:   [facility] [address] [place] [address] [phone]       Follow up with [name],[name] on 02/24/2011. (2:00)    Contact information:   [address] [facility] [place] [address] [phone]          Discharge Medications:  Current Discharge Medication List    START taking these medications   Details  isosorbide mononitrate (IMDUR) 30 MG 24 hr tablet Take 1 tablet (30 mg total) by mouth daily. Qty: 30 tablet, Refills: 6    metoprolol (LOPRESSOR) 50 MG tablet Take 1 tablet (50 mg total) by mouth 2 (two) times daily. Qty: 60 tablet, Refills: 6    nitroGLYCERIN (NITROSTAT) 0.4 MG SL tablet Place 1 tablet (0.4 mg total) under the tongue every 5 (five) minutes as needed for chest pain. Qty: 25 tablet, Refills: 3    rosuvastatin (CRESTOR) 10 MG tablet Take 1 tablet (10 mg total) by mouth daily. Qty: 30 tablet, Refills: 6    warfarin (COUMADIN) 5 MG tablet Take 1 tablet (5 mg total) by  mouth daily. Qty: 30 tablet, Refills: 3      CONTINUE these medications which have NOT CHANGED   Details  acetaminophen (TYLENOL) 325 MG tablet Take 650 mg by mouth as needed. pain    amLODipine (NORVASC) 5 MG tablet      cloNIDine (CATAPRES) 0.1 MG tablet Take 0.1 mg by mouth daily.      Tamsulosin HCl (FLOMAX) 0.4 MG CAPS Take 0.4 mg by mouth at bedtime.       STOP taking these medications     furosemide (LASIX) 20 MG tablet      hydrALAZINE (APRESOLINE) 10 MG tablet      potassium chloride (KLOR-CON) 10 MEQ CR tablet      lisinopril (PRINIVIL,ZESTRIL) 2.5 MG tablet         Outstanding Labs/Studies: BMET on 02/23/11, INR on 02/25/11, Needs outpatient Carotid US rescheduled (it was scheduled for today)  Duration of Discharge Encounter: Greater than 30 minutes including physician time.  Signed, Ajay Strubel, PA-C 02/22/2011, 11:57 AM

## 2011-02-22 NOTE — Discharge Summary (Signed)
Patient seen and examined.  Plan as discussed in my rounding note for today and outlined above. [name]  02/22/2011  8:19 PM

## 2011-02-22 NOTE — Progress Notes (Signed)
At 0018 Pt had 7 beats of wide complex tachy. Pt sleeping and asymptomatic. Strip posted and will cont to monitor.

## 2011-02-22 NOTE — Progress Notes (Signed)
SUBJECTIVE:  No chest pain.  No SOB. Breathing at baseline.  Wants to go home  PHYSICAL EXAM Filed Vitals:   02/21/11 0602 02/21/11 2100 02/22/11 0500 02/22/11 0613  BP: 133/82 158/95 122/66   Pulse: 65 79 71   Temp: 97.4 F (36.3 C) 98 F (36.7 C) 97.7 F (36.5 C)   TempSrc: Oral Oral Oral   Resp: 18 20 20    Height:      Weight: 200 lb 9.9 oz (91 kg)   200 lb 4.8 oz (90.855 kg)  SpO2: 95% 94% 97%    General:  No distress.  Looks well Lungs:  Clear Heart:  Irregular Abdomen:  Positive bowel sounds, no rebound no guarding Extremities:  No edema  LABS:  Results for orders placed during the hospital encounter of 02/16/11 (from the past 24 hour(s))  CBC     Status: Abnormal   Collection Time   02/22/11  6:35 AM      Component Value Range   WBC 2.8 (*) 4.0 - 10.5 (K/uL)   RBC 2.81 (*) 4.22 - 5.81 (MIL/uL)   Hemoglobin 9.1 (*) 13.0 - 17.0 (g/dL)   HCT 09.6 (*) 04.5 - 52.0 (%)   MCV 99.3  78.0 - 100.0 (fL)   MCH 32.4  26.0 - 34.0 (pg)   MCHC 32.6  30.0 - 36.0 (g/dL)   RDW 40.9  81.1 - 91.4 (%)   Platelets 116 (*) 150 - 400 (K/uL)  BASIC METABOLIC PANEL     Status: Abnormal   Collection Time   02/22/11  6:35 AM      Component Value Range   Sodium 140  135 - 145 (mEq/L)   Potassium 5.4 (*) 3.5 - 5.1 (mEq/L)   Chloride 107  96 - 112 (mEq/L)   CO2 24  19 - 32 (mEq/L)   Glucose, Bld 106 (*) 70 - 99 (mg/dL)   BUN 52 (*) 6 - 23 (mg/dL)   Creatinine, Ser 7.82 (*) 0.50 - 1.35 (mg/dL)   Calcium 9.2  8.4 - 95.6 (mg/dL)   GFR calc non Af Amer 17 (*) >90 (mL/min)   GFR calc Af Amer 19 (*) >90 (mL/min)    Intake/Output Summary (Last 24 hours) at 02/22/11 0919 Last data filed at 02/22/11 0700  Gross per 24 hour  Intake    480 ml  Output    215 ml  Net    265 ml    ASSESSMENT AND PLAN:  1. CAD/NSTEMI: Patient has been medically managed with heparin and nitro drips secondary to his poor renal function. On Imdur 30mg  daily.  Continue  BB, and statin.  Stop ASA   2.  Atrial Fibrillation: He has remained in atrial fibrillation, but has been rate controlled with metoprolol 50mg  BID. His CHADS2 score is 3 and will therefore need outpatient anticoagulation, Coumadin.  Coumadin to be started on discharge.  The patient has been educated about this.  Need stool guaiac before discharge.  3. Dyspnea: His CXR revealed diffuse interstitial pulmonary edema and his BNP was elevated on admission. He was diuresed with a one time dose of 40mg  IV Lasix and transitioned to his home oral dose. His Echo on 02/17/11 demonstrated normal LV systolic function with an EF of 50-55%, severe LVH, asymmetric hypertrophy and no regional wall motion abnormalities. His shortness of breath is markedly improved. Holding Laxis at discharge.  No salt!!!   4. HTN: Stable on home med regimen.   5. HLD: Lipid Panel revealed  LDL 67, HLD 59, with total cholesterol 144. Continue statin.  6. CKD: Creatinine and potassium slightly elevated.  However, we can watch this at home.     Needs BMET in the am.  Needs coumadin appt follow up.  Needs to see me or Tereso Newcomer early next wee.  No Lasix for now.  Other meds as listed accept no ASA once INR is above 2.0  [name] 02/22/2011 9:19 AM

## 2011-02-23 ENCOUNTER — Ambulatory Visit (INDEPENDENT_AMBULATORY_CARE_PROVIDER_SITE_OTHER): Payer: Medicare Other | Admitting: *Deleted

## 2011-02-23 DIAGNOSIS — I2510 Atherosclerotic heart disease of native coronary artery without angina pectoris: Secondary | ICD-10-CM

## 2011-02-23 LAB — BASIC METABOLIC PANEL
BUN: 52 mg/dL — ABNORMAL HIGH (ref 6–23)
CO2: 22 mEq/L (ref 19–32)
Chloride: 108 mEq/L (ref 96–112)
Glucose, Bld: 101 mg/dL — ABNORMAL HIGH (ref 70–99)
Potassium: 4.9 mEq/L (ref 3.5–5.1)

## 2011-02-25 ENCOUNTER — Ambulatory Visit (INDEPENDENT_AMBULATORY_CARE_PROVIDER_SITE_OTHER): Payer: Medicare Other | Admitting: *Deleted

## 2011-02-25 DIAGNOSIS — I4891 Unspecified atrial fibrillation: Secondary | ICD-10-CM

## 2011-02-25 DIAGNOSIS — Z7901 Long term (current) use of anticoagulants: Secondary | ICD-10-CM

## 2011-02-25 NOTE — Patient Instructions (Signed)

## 2011-02-26 ENCOUNTER — Other Ambulatory Visit: Payer: Self-pay | Admitting: *Deleted

## 2011-02-26 DIAGNOSIS — I509 Heart failure, unspecified: Secondary | ICD-10-CM

## 2011-03-01 ENCOUNTER — Other Ambulatory Visit: Payer: Medicare Other | Admitting: *Deleted

## 2011-03-01 ENCOUNTER — Other Ambulatory Visit (INDEPENDENT_AMBULATORY_CARE_PROVIDER_SITE_OTHER): Payer: Medicare Other | Admitting: *Deleted

## 2011-03-01 DIAGNOSIS — I509 Heart failure, unspecified: Secondary | ICD-10-CM

## 2011-03-01 LAB — BASIC METABOLIC PANEL
BUN: 48 mg/dL — ABNORMAL HIGH (ref 6–23)
CO2: 22 mEq/L (ref 19–32)
Chloride: 109 mEq/L (ref 96–112)
Creatinine, Ser: 3.1 mg/dL — ABNORMAL HIGH (ref 0.4–1.5)
Glucose, Bld: 92 mg/dL (ref 70–99)
Potassium: 5 mEq/L (ref 3.5–5.1)

## 2011-03-03 ENCOUNTER — Encounter: Payer: Medicare Other | Admitting: Physician Assistant

## 2011-03-04 ENCOUNTER — Ambulatory Visit (INDEPENDENT_AMBULATORY_CARE_PROVIDER_SITE_OTHER): Payer: Medicare Other | Admitting: Physician Assistant

## 2011-03-04 ENCOUNTER — Ambulatory Visit (INDEPENDENT_AMBULATORY_CARE_PROVIDER_SITE_OTHER): Payer: Medicare Other | Admitting: *Deleted

## 2011-03-04 ENCOUNTER — Encounter: Payer: Self-pay | Admitting: Physician Assistant

## 2011-03-04 VITALS — BP 138/72 | HR 65 | Ht 73.0 in | Wt 198.8 lb

## 2011-03-04 DIAGNOSIS — I509 Heart failure, unspecified: Secondary | ICD-10-CM

## 2011-03-04 DIAGNOSIS — I10 Essential (primary) hypertension: Secondary | ICD-10-CM

## 2011-03-04 DIAGNOSIS — Z7901 Long term (current) use of anticoagulants: Secondary | ICD-10-CM

## 2011-03-04 DIAGNOSIS — I6529 Occlusion and stenosis of unspecified carotid artery: Secondary | ICD-10-CM

## 2011-03-04 DIAGNOSIS — I2510 Atherosclerotic heart disease of native coronary artery without angina pectoris: Secondary | ICD-10-CM

## 2011-03-04 DIAGNOSIS — I4891 Unspecified atrial fibrillation: Secondary | ICD-10-CM

## 2011-03-04 DIAGNOSIS — I5032 Chronic diastolic (congestive) heart failure: Secondary | ICD-10-CM | POA: Insufficient documentation

## 2011-03-04 DIAGNOSIS — N259 Disorder resulting from impaired renal tubular function, unspecified: Secondary | ICD-10-CM

## 2011-03-04 NOTE — Progress Notes (Signed)
[address] [place], [place]  [address] Phone: [phone] Fax:  [phone]  Date:  03/04/2011   Name:  Tracy [name]       DOB:  Jan 24, 1920 MRN:  [id number]  PCP:  Dr. Doristine Counter Primary Cardiologist:  Dr. [name]    History of Present Illness: Tracy [name] is a 77 y.o. male who presents for post hospital follow up.  He has a history of CAD, status post prior PCI to the obtuse marginal in 1997 and stenting to the mid to distal RCA in 2001, hypertension, hyperlipidemia, stage III chronic kidney disease and prior TIA.  Last LHC 9/06:  LM ok, pLAD 50%, mLAD 60% then 60-70%, mRI 50%, mOM1 80-90% (small), pRCA and dRCA stents ok, PDA 50%, EF 65%.  Thus, he had high-grade small vessel disease and diffuse nonobstructive large vessel disease.  This was treated medically.  Last Myoview 5/12: No ischemia, EF 64%.    He was admitted 11/20-11/26 with NSTEMI in the setting of diastolic heart failure.  Troponin peaked at 2.25.  He was diuresed with IV Lasix.  He developed atrial fibrillation with RVR and was placed on rate control.  He has significant thromboembolic risk factors and he was placed on Coumadin.  With his chronic kidney disease, age and recent low risk Myoview, it was felt that medical therapy should be pursued initially.  He did well with adjustments in medications in the hospital.  Echocardiogram 02/17/11: Severe LVH, asymmetric hypertrophy, EF 50-55%, normal wall motion, high ventricular filling pressure, mild LAE, mild RVE, mildly reduced RVSF, mild RAE.  Of note, the patient was noted to have pancytopenia (white count 2800, hemoglobin 9.1, platelet count 116,000).  Plan was for him to followup with his PCP.  Of note, he was heme-negative x1 in the hospital.  Labs: K 5.4, creatinine 3.04, TSH 0.685, TC 144, TG 88, HDL 59, LDL 67.  Followup labs 03/01/11: K 5.0, creatinine 3.1.  He is doing well.  The patient denies chest pain, shortness of breath, syncope,  orthopnea, PND or significant pedal edema.  No palpitations.  Unfortunately, none of the records from the hospital were sent to Dr. Doristine Counter.  Past Medical History  Diagnosis Date  . CAD (coronary artery disease)     status post prior PCI to the obtuse marginal in 1997 and stenting to the mid to distal RCA in 2001; LHC 9/06:  LM ok, pLAD 50%, mLAD 60% then 60-70%, mRI 50%, mOM1 80-90% (small), pRCA and dRCA stents ok, PDA 50%, EF 65%;  Myoview 5/12: No ischemia, EF 64%.;  NSTEMI 11/12 tx medically   . Chronic kidney disease, stage III (moderate)   . TIA (transient ischemic attack)   . HTN (hypertension)   . Hyperlipidemia   . AF (atrial fibrillation) 02/16/11    Coumadin  . Arthritis     "in my left ankle"  . Chronic diastolic heart failure     Current Outpatient Prescriptions  Medication Sig Dispense Refill  . acetaminophen (TYLENOL) 325 MG tablet Take 650 mg by mouth as needed. pain      . amLODipine (NORVASC) 5 MG tablet        . cloNIDine (CATAPRES) 0.1 MG tablet Take 0.1 mg by mouth daily.        . isosorbide mononitrate (IMDUR) 30 MG 24 hr tablet Take 1 tablet (30 mg total) by mouth daily.  30 tablet  6  . metoprolol (LOPRESSOR) 50 MG tablet Take 1  tablet (50 mg total) by mouth 2 (two) times daily.  60 tablet  6  . nitroGLYCERIN (NITROSTAT) 0.4 MG SL tablet Place 1 tablet (0.4 mg total) under the tongue every 5 (five) minutes as needed for chest pain.  25 tablet  3  . rosuvastatin (CRESTOR) 10 MG tablet Take 1 tablet (10 mg total) by mouth daily.  30 tablet  6  . Tamsulosin HCl (FLOMAX) 0.4 MG CAPS Take 0.4 mg by mouth at bedtime.       Marland Kitchen warfarin (COUMADIN) 5 MG tablet Take 1 tablet (5 mg total) by mouth daily.  30 tablet  3    Allergies: Allergies  Allergen Reactions  . Oxycodone Hcl   . Prednisone     History  Substance Use Topics  . Smoking status: Former Smoker -- 2.0 packs/day for 30 years    Types: Cigarettes  . Smokeless tobacco: Never Used   Comment: 1980 quit  smoking cigarrettes and pipe  . Alcohol Use: Yes     "glass of wine or beer once in awhile"     PHYSICAL EXAM: VS:  BP 138/72  Pulse 65  Ht 6\' 1"  (1.854 m)  Wt 198 lb 12.8 oz (90.175 kg)  BMI 26.23 kg/m2 Well nourished, well developed, in no acute distress HEENT: normal Neck: no JVD Cardiac:  normal S1, S2; RRR; no murmur Lungs:  clear to auscultation bilaterally, no wheezing, rhonchi or rales Abd: soft, nontender, no hepatomegaly Ext: no edema Skin: warm and dry Neuro:  CNs 2-12 intact, no focal abnormalities noted  EKG:   Atrial fibrillation, heart rate 65, left axis deviation, nonspecific ST-T wave changes, LVH  ASSESSMENT AND PLAN:

## 2011-03-04 NOTE — Assessment & Plan Note (Signed)
In the setting of chronic kidney disease.  Volume currently stable.  He is not on Lasix at this time.  Renal function by recent lab work was stable.  Continue current therapy.  Followup in 6 weeks with Dr. Antoine Poche.

## 2011-03-04 NOTE — Assessment & Plan Note (Signed)
Followup carotid Dopplers were scheduled while he was hospitalized.  Reschedule carotid Dopplers.

## 2011-03-04 NOTE — Assessment & Plan Note (Signed)
Status post recent NSTEMI.  Medical therapy is being pursued due to the patient's age plus chronic kidney disease.  He is not having any current angina.  He is not on aspirin do to Coumadin therapy and chronic kidney disease.  He continues on Crestor.  Followup with Dr. Antoine Poche in 6 weeks.

## 2011-03-04 NOTE — Patient Instructions (Addendum)
Your physician recommends that you schedule a follow-up appointment in: 6 WEEKS WITH DR. [facility]  Your physician has requested that you have a BILATERAL carotid duplex DX CAROTID STENOSIS. This test is an ultrasound of the carotid arteries in your neck. It looks at blood flow through these arteries that supply the brain with blood. Allow one hour for this exam. There are no restrictions or special instructions.

## 2011-03-04 NOTE — Assessment & Plan Note (Signed)
Rate controlled.  Continue Coumadin with significant thromboembolic risk factors.

## 2011-03-04 NOTE — Assessment & Plan Note (Signed)
Controlled.  Continue current therapy.  

## 2011-03-04 NOTE — Assessment & Plan Note (Signed)
Renal function stable.  Continue followup with nephrology.

## 2011-03-11 ENCOUNTER — Ambulatory Visit (INDEPENDENT_AMBULATORY_CARE_PROVIDER_SITE_OTHER): Payer: Medicare Other | Admitting: *Deleted

## 2011-03-11 ENCOUNTER — Telehealth: Payer: Self-pay | Admitting: Internal Medicine

## 2011-03-11 DIAGNOSIS — I4891 Unspecified atrial fibrillation: Secondary | ICD-10-CM

## 2011-03-11 DIAGNOSIS — Z7901 Long term (current) use of anticoagulants: Secondary | ICD-10-CM

## 2011-03-11 NOTE — Telephone Encounter (Signed)
Talked to pt, gave him appt, he informed me that Dr Caryn Section also wants to give him a shot for same thing. I explained to him that Dr. Caryn Section is a nephrologist and Dr [name] a hematologist. He refused appt.

## 2011-03-13 ENCOUNTER — Telehealth: Payer: Self-pay | Admitting: Internal Medicine

## 2011-03-13 NOTE — Telephone Encounter (Signed)
Talked to pt gave him appt date, address and time

## 2011-03-15 ENCOUNTER — Telehealth: Payer: Self-pay | Admitting: Oncology

## 2011-03-15 NOTE — Telephone Encounter (Signed)
Received return fax confirmation that was sent to dr burnette's office informing them that pt declined appt asking that we call pt and be sure he understands that this is not anemia his wbc is very low. S/w briana @ dr burnette's office this morning re fax and informed her that their office would have to assume the responsibility of making sure the patient understands what's wrong with him and why he needs this appt.

## 2011-03-16 ENCOUNTER — Telehealth: Payer: Self-pay | Admitting: Oncology

## 2011-03-16 NOTE — Telephone Encounter (Signed)
Referred by Dr. Doristine Counter Dx- Pancytopenia

## 2011-03-18 ENCOUNTER — Ambulatory Visit (INDEPENDENT_AMBULATORY_CARE_PROVIDER_SITE_OTHER): Payer: Medicare Other | Admitting: *Deleted

## 2011-03-18 DIAGNOSIS — I4891 Unspecified atrial fibrillation: Secondary | ICD-10-CM

## 2011-03-18 DIAGNOSIS — Z7901 Long term (current) use of anticoagulants: Secondary | ICD-10-CM

## 2011-03-24 ENCOUNTER — Ambulatory Visit: Payer: Medicare Other

## 2011-03-24 ENCOUNTER — Ambulatory Visit (INDEPENDENT_AMBULATORY_CARE_PROVIDER_SITE_OTHER): Payer: Medicare Other | Admitting: *Deleted

## 2011-03-24 ENCOUNTER — Other Ambulatory Visit: Payer: Medicare Other | Admitting: Lab

## 2011-03-24 ENCOUNTER — Ambulatory Visit: Payer: Medicare Other | Admitting: Internal Medicine

## 2011-03-24 DIAGNOSIS — I4891 Unspecified atrial fibrillation: Secondary | ICD-10-CM

## 2011-03-24 DIAGNOSIS — Z7901 Long term (current) use of anticoagulants: Secondary | ICD-10-CM

## 2011-03-24 LAB — POCT INR: INR: 3.1

## 2011-04-05 ENCOUNTER — Encounter (INDEPENDENT_AMBULATORY_CARE_PROVIDER_SITE_OTHER): Payer: Medicare Other | Admitting: Cardiology

## 2011-04-05 ENCOUNTER — Ambulatory Visit: Payer: Medicare Other | Admitting: Internal Medicine

## 2011-04-05 DIAGNOSIS — I6529 Occlusion and stenosis of unspecified carotid artery: Secondary | ICD-10-CM

## 2011-04-06 ENCOUNTER — Other Ambulatory Visit: Payer: Medicare Other

## 2011-04-07 ENCOUNTER — Other Ambulatory Visit: Payer: Self-pay | Admitting: Internal Medicine

## 2011-04-07 ENCOUNTER — Other Ambulatory Visit (HOSPITAL_BASED_OUTPATIENT_CLINIC_OR_DEPARTMENT_OTHER): Payer: Medicare Other | Admitting: Lab

## 2011-04-07 ENCOUNTER — Ambulatory Visit: Payer: Medicare Other

## 2011-04-07 ENCOUNTER — Ambulatory Visit (HOSPITAL_BASED_OUTPATIENT_CLINIC_OR_DEPARTMENT_OTHER): Payer: Medicare Other

## 2011-04-07 ENCOUNTER — Ambulatory Visit (HOSPITAL_BASED_OUTPATIENT_CLINIC_OR_DEPARTMENT_OTHER): Payer: Medicare Other | Admitting: Internal Medicine

## 2011-04-07 DIAGNOSIS — N189 Chronic kidney disease, unspecified: Secondary | ICD-10-CM

## 2011-04-07 DIAGNOSIS — D649 Anemia, unspecified: Secondary | ICD-10-CM

## 2011-04-07 DIAGNOSIS — D72819 Decreased white blood cell count, unspecified: Secondary | ICD-10-CM

## 2011-04-07 DIAGNOSIS — E785 Hyperlipidemia, unspecified: Secondary | ICD-10-CM

## 2011-04-07 DIAGNOSIS — D61818 Other pancytopenia: Secondary | ICD-10-CM

## 2011-04-07 DIAGNOSIS — D539 Nutritional anemia, unspecified: Secondary | ICD-10-CM

## 2011-04-07 LAB — CBC WITH DIFFERENTIAL/PLATELET
BASO%: 0.3 % (ref 0.0–2.0)
Eosinophils Absolute: 0.1 10*3/uL (ref 0.0–0.5)
HCT: 30.5 % — ABNORMAL LOW (ref 38.4–49.9)
LYMPH%: 18.5 % (ref 14.0–49.0)
MONO#: 0.1 10*3/uL (ref 0.1–0.9)
NEUT#: 2.3 10*3/uL (ref 1.5–6.5)
NEUT%: 73.1 % (ref 39.0–75.0)
Platelets: 204 10*3/uL (ref 140–400)
RBC: 3.08 10*6/uL — ABNORMAL LOW (ref 4.20–5.82)
WBC: 3.1 10*3/uL — ABNORMAL LOW (ref 4.0–10.3)
lymph#: 0.6 10*3/uL — ABNORMAL LOW (ref 0.9–3.3)

## 2011-04-07 LAB — COMPREHENSIVE METABOLIC PANEL
ALT: 18 U/L (ref 0–53)
CO2: 18 mEq/L — ABNORMAL LOW (ref 19–32)
Calcium: 9.2 mg/dL (ref 8.4–10.5)
Chloride: 106 mEq/L (ref 96–112)
Glucose, Bld: 94 mg/dL (ref 70–99)
Sodium: 138 mEq/L (ref 135–145)
Total Bilirubin: 0.7 mg/dL (ref 0.3–1.2)
Total Protein: 6.1 g/dL (ref 6.0–8.3)

## 2011-04-07 NOTE — Progress Notes (Signed)
Tracy [name] CONSULT NOTE  REASON FOR CONSULTATION:  78 years old white male with anemia and Leukocytopenia.  HPI Tracy [name] is a 78 y.o. male was past medical history significant for coronary artery disease, chronic kidney disease, transient ischemic attacks, hypotension, dyslipidemia, atrial fibrillation and arthritis. The patient was seen by his primary care physician recently and found to have persistent anemia and leukocytopenia. He was also seen by Dr. Caryn [name] for his renal insufficiency and he was started on treatment with Procrit every 2 weeks. He notes some improvement in his stamina recently. The patient was referred to me today for evaluation and recommendation regarding his persistent anemia and leukocytopenia. He denied having any significant complaints today except for fatigue and shortness of breath with exertion. He has no bleeding issues, no bruises or ecchymosis. He has no significant weight loss or night sweats. No cough or hemoptysis. He denied having any dizzy spells. He is currently on several medications including Norvasc, clonidine, nitroglycerin, Lopressor, Nitrostat, Crestor, Flomax and Coumadin. He has no recent infection.  @SFHPI @  Past Medical History  Diagnosis Date  . CAD (coronary artery disease)     status post prior PCI to the obtuse marginal in 1997 and stenting to the mid to distal RCA in 2001; LHC 9/06:  LM ok, pLAD 50%, mLAD 60% then 60-70%, mRI 50%, mOM1 80-90% (small), pRCA and dRCA stents ok, PDA 50%, EF 65%;  Myoview 5/12: No ischemia, EF 64%.;  NSTEMI 11/12 tx medically   . Chronic kidney disease, stage III (moderate)   . TIA (transient ischemic attack)   . HTN (hypertension)   . Hyperlipidemia   . AF (atrial fibrillation) 02/16/11    Coumadin  . Arthritis     "in my left ankle"  . Chronic diastolic heart failure     Past Surgical History  Procedure Date  . Nose surgery 1985    deviated septum  . Total knee arthroplasty 2009    left    . Joint replacement 2009    left knee  . Appendectomy 1930's  . Tonsillectomy and adenoidectomy 1930's  . Coronary angioplasty with stent placement     3 procedures; 3 stents total  . Cataract extraction w/ intraocular lens  implant, bilateral   . Surgery scrotal / testicular 1971    "testicle crushed; removed"    No family history on file.  Social History History  Substance Use Topics  . Smoking status: Former Smoker -- 2.0 packs/day for 30 years    Types: Cigarettes  . Smokeless tobacco: Never Used   Comment: 1980 quit smoking cigarrettes and pipe  . Alcohol Use: Yes     "glass of wine or beer once in awhile"    Allergies  Allergen Reactions  . Oxycodone Hcl   . Prednisone     Current Outpatient Prescriptions  Medication Sig Dispense Refill  . amLODipine (NORVASC) 5 MG tablet 10 mg daily.       . cloNIDine (CATAPRES) 0.1 MG tablet Take 0.1 mg by mouth daily.        . isosorbide mononitrate (IMDUR) 30 MG 24 hr tablet Take 1 tablet (30 mg total) by mouth daily.      . metoprolol (LOPRESSOR) 50 MG tablet Take 1 tablet (50 mg total) by mouth 2 (two) times daily.  60 tablet  6  . rosuvastatin (CRESTOR) 10 MG tablet Take 1 tablet (10 mg total) by mouth daily.  30 tablet  6  . Tamsulosin HCl (  FLOMAX) 0.4 MG CAPS Take 0.4 mg by mouth at bedtime.       Marland Kitchen warfarin (COUMADIN) 5 MG tablet Take 1 tablet (5 mg total) by mouth daily.  30 tablet  3  . acetaminophen (TYLENOL) 325 MG tablet Take 650 mg by mouth as needed. pain      . nitroGLYCERIN (NITROSTAT) 0.4 MG SL tablet Place 1 tablet (0.4 mg total) under the tongue every 5 (five) minutes as needed for chest pain.  25 tablet  3  . DISCONTD: isosorbide mononitrate (IMDUR) 30 MG 24 hr tablet Take 1 tablet (30 mg total) by mouth daily.  30 tablet  6    Review of Systems  A comprehensive review of systems was negative except for: Constitutional: positive for fatigue Respiratory: positive for dyspnea on exertion  Physical  Exam  ZOX:WRUEA, healthy, no distress, well nourished and well developed SKIN: skin color, texture, turgor are normal HEAD: Normocephalic EYES: normal EARS: External ears normal OROPHARYNX:no exudate, no erythema and lips, buccal mucosa, and tongue normal  NECK: supple, no adenopathy LYMPH:  no palpable lymphadenopathy, no hepatosplenomegaly LUNGS: clear to auscultation  HEART: no murmurs, no gallops and irregularly irregular ABDOMEN:abdomen soft, non-tender, normal bowel sounds and no masses or organomegaly BACK: Back symmetric, no curvature. EXTREMITIES:no joint deformities, effusion, or inflammation, no edema, no skin discoloration, no clubbing, no cyanosis  NEURO: alert & oriented x 3 with fluent speech, no focal motor/sensory deficits    ASSESSMENT: This is a very pleasant 78 years old white male with a persistent anemia most likely anemia of chronic disease in a patient with chronic renal failure as well as cardiac disease. He also has leukocytopenia which is most likely drug-induced especially with his current treatment with Crestor. Post the anemia and leukocytopenia has improved since his last lab work. I discussed you that result with the patient today. I ordered several studies to evaluate date drainage of his anemia and leukocytopenia, including repeat CBC, comprehensive metabolic panel, LDH, iron study and ferritin, serum folate, vitamin B12 level as well as serum protein electrophoreses. These studies are still pending.   PLAN: I would see the patient back for followup visit in 6 weeks for evaluation and discussion of his lab results. He will continue on Procrit as ordered by Dr. Caryn [name]. I gave the patient and his wife the time to ask questions and I answered them completely to their satisfaction. The patient was advised to call me immediately if he has any concerning symptoms in the interval.   All questions were answered. The patient knows to call the clinic with any problems,  questions or concerns. We can certainly see the patient much sooner if necessary.  Thank you so much for allowing me to participate in the care of Tracy [name]. I will continue to follow up the patient with you and assist in his care.  I spent 25 minutes counseling the patient face to face. The total time spent in the appointment was 55 minutes.   Joeph Szatkowski K. 04/07/2011, 7:26 PM

## 2011-04-08 ENCOUNTER — Ambulatory Visit (INDEPENDENT_AMBULATORY_CARE_PROVIDER_SITE_OTHER): Payer: Medicare Other | Admitting: *Deleted

## 2011-04-08 DIAGNOSIS — Z7901 Long term (current) use of anticoagulants: Secondary | ICD-10-CM

## 2011-04-08 DIAGNOSIS — I4891 Unspecified atrial fibrillation: Secondary | ICD-10-CM

## 2011-04-08 LAB — POCT INR: INR: 3.9

## 2011-04-09 ENCOUNTER — Telehealth: Payer: Self-pay | Admitting: Cardiology

## 2011-04-09 NOTE — Telephone Encounter (Signed)
Message copied by Karle Plumber on Fri Apr 09, 2011  9:19 AM ------      Message from: [place], [place] T      Created: Thu Apr 08, 2011  5:45 PM       0-39% bilat      Repeat dopplers in 2 years.      Tereso Newcomer, PA-C  5:44 PM 04/08/2011

## 2011-04-09 NOTE — Telephone Encounter (Signed)
Pt notified of results of carotid doppler.

## 2011-04-12 LAB — IRON AND TIBC
%SAT: 21 % (ref 20–55)
Iron: 58 ug/dL (ref 42–165)

## 2011-04-12 LAB — SPEP & IFE WITH QIG
Beta 2: 3.5 % (ref 3.2–6.5)
Beta Globulin: 5.1 % (ref 4.7–7.2)
IgA: 119 mg/dL (ref 68–379)
IgG (Immunoglobin G), Serum: 1240 mg/dL (ref 650–1600)
Total Protein, Serum Electrophoresis: 6 g/dL (ref 6.0–8.3)

## 2011-04-12 LAB — FERRITIN: Ferritin: 34 ng/mL (ref 22–322)

## 2011-04-15 ENCOUNTER — Ambulatory Visit (INDEPENDENT_AMBULATORY_CARE_PROVIDER_SITE_OTHER): Payer: Medicare Other | Admitting: *Deleted

## 2011-04-15 ENCOUNTER — Ambulatory Visit (INDEPENDENT_AMBULATORY_CARE_PROVIDER_SITE_OTHER): Payer: Medicare Other | Admitting: Cardiology

## 2011-04-15 ENCOUNTER — Encounter: Payer: Self-pay | Admitting: Cardiology

## 2011-04-15 DIAGNOSIS — I5032 Chronic diastolic (congestive) heart failure: Secondary | ICD-10-CM

## 2011-04-15 DIAGNOSIS — I10 Essential (primary) hypertension: Secondary | ICD-10-CM

## 2011-04-15 DIAGNOSIS — I4891 Unspecified atrial fibrillation: Secondary | ICD-10-CM

## 2011-04-15 DIAGNOSIS — I6529 Occlusion and stenosis of unspecified carotid artery: Secondary | ICD-10-CM

## 2011-04-15 DIAGNOSIS — I2510 Atherosclerotic heart disease of native coronary artery without angina pectoris: Secondary | ICD-10-CM

## 2011-04-15 DIAGNOSIS — Z7901 Long term (current) use of anticoagulants: Secondary | ICD-10-CM

## 2011-04-15 DIAGNOSIS — I509 Heart failure, unspecified: Secondary | ICD-10-CM

## 2011-04-15 LAB — POCT INR: INR: 2.7

## 2011-04-15 MED ORDER — ROSUVASTATIN CALCIUM 20 MG PO TABS: 20.0000 mg | ORAL_TABLET | Freq: Every day | ORAL | Status: DC

## 2011-04-15 NOTE — Assessment & Plan Note (Signed)
He has known coronary disease and did have a non-Q-wave microinfarction. However, he has had a recent low risk perfusion study. He's having no new symptoms and he will continue to be managed with risk reduction.

## 2011-04-15 NOTE — Assessment & Plan Note (Signed)
The blood pressure is at target. No change in medications is indicated. We will continue with therapeutic lifestyle changes (TLC).  

## 2011-04-15 NOTE — Progress Notes (Signed)
HPI The patient presents for evaluation of CAD.  This is his second visit since his recent hospitalization. We've been managing chest pain medically. He did have a non-Q-wave microinfarction late last year. We've been managing atrial fibrillation with rate control and anticoagulation. He has renal insufficiency which is stable and he's following with nephrology. He has chronic anemia of chronic disease and has seen hematology and is receiving therapy for this. He thinks his fatigue is slowly improving. He's still having some mild chronic dyspnea but he's not having any resting symptoms such as PND or orthopnea. He's not having any palpitations, presyncope or syncope. He's having no chest pressure, neck or arm discomfort.   Allergies  Allergen Reactions  . Oxycodone Hcl   . Prednisone     Current Outpatient Prescriptions  Medication Sig Dispense Refill  . acetaminophen (TYLENOL) 325 MG tablet Take 650 mg by mouth as needed. pain      . amLODipine (NORVASC) 5 MG tablet 10 mg daily.       . cloNIDine (CATAPRES) 0.1 MG tablet Take 0.1 mg by mouth daily.        . isosorbide mononitrate (IMDUR) 30 MG 24 hr tablet Take 1 tablet (30 mg total) by mouth daily.      . metoprolol (LOPRESSOR) 50 MG tablet Take 50 mg by mouth daily.      . nitroGLYCERIN (NITROSTAT) 0.4 MG SL tablet Place 1 tablet (0.4 mg total) under the tongue every 5 (five) minutes as needed for chest pain.  25 tablet  3  . rosuvastatin (CRESTOR) 10 MG tablet Take 1 tablet (10 mg total) by mouth daily.  30 tablet  6  . Tamsulosin HCl (FLOMAX) 0.4 MG CAPS Take 0.4 mg by mouth at bedtime.       Marland Kitchen warfarin (COUMADIN) 5 MG tablet Take 1 tablet (5 mg total) by mouth daily.  30 tablet  3  . DISCONTD: isosorbide mononitrate (IMDUR) 30 MG 24 hr tablet Take 1 tablet (30 mg total) by mouth daily.  30 tablet  6    Past Medical History  Diagnosis Date  . CAD (coronary artery disease)     status post prior PCI to the obtuse marginal in 1997 and  stenting to the mid to distal RCA in 2001; LHC 9/06:  LM ok, pLAD 50%, mLAD 60% then 60-70%, mRI 50%, mOM1 80-90% (small), pRCA and dRCA stents ok, PDA 50%, EF 65%;  Myoview 5/12: No ischemia, EF 64%.;  NSTEMI 11/12 tx medically   . Chronic kidney disease, stage III (moderate)   . TIA (transient ischemic attack)   . HTN (hypertension)   . Hyperlipidemia   . AF (atrial fibrillation) 02/16/11    Coumadin  . Arthritis     "in my left ankle"  . Chronic diastolic heart failure     Past Surgical History  Procedure Date  . Nose surgery 1985    deviated septum  . Total knee arthroplasty 2009    left  . Joint replacement 2009    left knee  . Appendectomy 1930's  . Tonsillectomy and adenoidectomy 1930's  . Coronary angioplasty with stent placement     3 procedures; 3 stents total  . Cataract extraction w/ intraocular lens  implant, bilateral   . Surgery scrotal / testicular 1971    "testicle crushed; removed"    ROS:  As stated in the HPI and negative for all other systems.  PHYSICAL EXAM BP 108/66  Pulse 80  Resp 16  Ht 6\' 1"  (1.854 m)  Wt 201 lb (91.173 kg)  BMI 26.52 kg/m2 GENERAL:  Well appearing HEENT:  Pupils equal round and reactive, fundi not visualized, oral mucosa unremarkable NECK:  No jugular venous distention, waveform within normal limits, carotid upstroke brisk and symmetric, no bruits, no thyromegaly LYMPHATICS:  No cervical, inguinal adenopathy LUNGS:  Clear to auscultation bilaterally BACK:  No CVA tenderness CHEST:  Unremarkable HEART:  PMI not displaced or sustained,S1 and S2 within normal limits, no S3, no S4, no clicks, no rubs, no murmurs ABD:  Flat, positive bowel sounds normal in frequency in pitch, no bruits, no rebound, no guarding, no midline pulsatile mass, no hepatomegaly, no splenomegaly EXT:  2 plus pulses throughout, no edema, no cyanosis no clubbing SKIN:  No rashes no nodules NEURO:  Cranial nerves II through XII grossly intact, motor grossly  intact throughout PSYCH:  Cognitively intact, oriented to person place and time  ASSESSMENT AND PLAN

## 2011-04-15 NOTE — Patient Instructions (Signed)
Your physician wants you to follow-up in: 4 months. You will receive a reminder letter in the mail two months in advance. If you don't receive a letter, please call our office to schedule the follow-up appointment.  

## 2011-04-15 NOTE — Assessment & Plan Note (Signed)
He has 0-39% bilateral stenosis on recent study and will have followup in a couple of years.

## 2011-04-15 NOTE — Assessment & Plan Note (Signed)
The patient  tolerates this rhythm and rate control and anticoagulation. We will continue with the meds as listed.  

## 2011-04-15 NOTE — Assessment & Plan Note (Signed)
He seems to be euvolemic.  At this point, no change in therapy is indicated.  We have reviewed salt and fluid restrictions.  No further cardiovascular testing is indicated.   

## 2011-04-23 ENCOUNTER — Encounter: Payer: Self-pay | Admitting: Internal Medicine

## 2011-04-29 ENCOUNTER — Ambulatory Visit (INDEPENDENT_AMBULATORY_CARE_PROVIDER_SITE_OTHER): Payer: Medicare Other | Admitting: *Deleted

## 2011-04-29 DIAGNOSIS — Z7901 Long term (current) use of anticoagulants: Secondary | ICD-10-CM

## 2011-04-29 DIAGNOSIS — I4891 Unspecified atrial fibrillation: Secondary | ICD-10-CM

## 2011-05-06 ENCOUNTER — Other Ambulatory Visit: Payer: Self-pay | Admitting: *Deleted

## 2011-05-06 MED ORDER — ROSUVASTATIN CALCIUM 20 MG PO TABS: 20.0000 mg | ORAL_TABLET | Freq: Every day | ORAL | Status: DC

## 2011-05-07 ENCOUNTER — Other Ambulatory Visit: Payer: Self-pay | Admitting: Cardiology

## 2011-05-12 ENCOUNTER — Telehealth: Payer: Self-pay | Admitting: Cardiology

## 2011-05-12 NOTE — Telephone Encounter (Signed)
New msg Pt called and wanted to tell you that crestor was shipped to the office for him to pick up.

## 2011-05-12 NOTE — Telephone Encounter (Signed)
Will watch for package and call pt when it comes in.

## 2011-05-17 ENCOUNTER — Telehealth: Payer: Self-pay | Admitting: *Deleted

## 2011-05-17 NOTE — Telephone Encounter (Signed)
Received Crestor 20 mg samples from [facility] for pt.  Will be placed at front desk for pickup.

## 2011-05-18 ENCOUNTER — Other Ambulatory Visit: Payer: Self-pay | Admitting: *Deleted

## 2011-05-18 DIAGNOSIS — D72819 Decreased white blood cell count, unspecified: Secondary | ICD-10-CM

## 2011-05-19 ENCOUNTER — Ambulatory Visit (HOSPITAL_BASED_OUTPATIENT_CLINIC_OR_DEPARTMENT_OTHER): Payer: Medicare Other | Admitting: Internal Medicine

## 2011-05-19 ENCOUNTER — Other Ambulatory Visit: Payer: Medicare Other

## 2011-05-19 ENCOUNTER — Telehealth: Payer: Self-pay | Admitting: Internal Medicine

## 2011-05-19 DIAGNOSIS — D72819 Decreased white blood cell count, unspecified: Secondary | ICD-10-CM

## 2011-05-19 DIAGNOSIS — L299 Pruritus, unspecified: Secondary | ICD-10-CM

## 2011-05-19 DIAGNOSIS — D61818 Other pancytopenia: Secondary | ICD-10-CM

## 2011-05-19 LAB — CBC WITH DIFFERENTIAL/PLATELET
BASO%: 0 % (ref 0.0–2.0)
EOS%: 3.7 % (ref 0.0–7.0)
HCT: 28.9 % — ABNORMAL LOW (ref 38.4–49.9)
MCH: 32.5 pg (ref 27.2–33.4)
MCHC: 33 g/dL (ref 32.0–36.0)
NEUT%: 77.7 % — ABNORMAL HIGH (ref 39.0–75.0)
RBC: 2.93 10*6/uL — ABNORMAL LOW (ref 4.20–5.82)
RDW: 16.9 % — ABNORMAL HIGH (ref 11.0–14.6)
lymph#: 0.5 10*3/uL — ABNORMAL LOW (ref 0.9–3.3)

## 2011-05-19 NOTE — Telephone Encounter (Signed)
Gv pt appt for march2013,  informed pt that [facility] will call him to scheduled biospy for next week

## 2011-05-19 NOTE — Progress Notes (Signed)
[facility] Telephone:([phone]   Fax:([phone]  OFFICE PROGRESS NOTE  [name], MD, MD [address] [place] [address]  DIAGNOSIS: Pancytopenia of unknown etiology.  PRIOR THERAPY: None  CURRENT THERAPY: Weekly Procrit under the care of Dr. Caryn Section for anemia of chronic disease.  INTERVAL HISTORY: Tracy [name] 78 y.o. male returns to the clinic today for followup visit accompanied his wife. The patient is feeling fine today with no specific complaints except for generalized itching more in the back. He was seen by his primary care physician and was treated for scabies. No other significant complaints. He denied having any fatigue or dizzy spells, no bleeding issues. He has repeat CBC performed earlier today and is here for evaluation and discussion of his lab results. His previous blood work showed normal iron studies and ferritin as well as normal vitamin B 12 level and serum folate. Serum protein electrophoreses was unremarkable.  MEDICAL HISTORY: Past Medical History  Diagnosis Date  . CAD (coronary artery disease)     status post prior PCI to the obtuse marginal in 1997 and stenting to the mid to distal RCA in 2001; LHC 9/06:  LM ok, pLAD 50%, mLAD 60% then 60-70%, mRI 50%, mOM1 80-90% (small), pRCA and dRCA stents ok, PDA 50%, EF 65%;  Myoview 5/12: No ischemia, EF 64%.;  NSTEMI 11/12 tx medically   . Chronic kidney disease, stage III (moderate)   . TIA (transient ischemic attack)   . HTN (hypertension)   . Hyperlipidemia   . AF (atrial fibrillation) 02/16/11    Coumadin  . Arthritis     "in my left ankle"  . Chronic diastolic heart failure   . Anemia of chronic disease     ALLERGIES:  is allergic to oxycodone hcl and prednisone.  MEDICATIONS:  Current Outpatient Prescriptions  Medication Sig Dispense Refill  . acetaminophen (TYLENOL) 325 MG tablet Take 650 mg by mouth as needed. pain      . amLODipine (NORVASC) 5 MG tablet 10 mg  daily.       . cloNIDine (CATAPRES) 0.1 MG tablet Take 0.1 mg by mouth daily.        . isosorbide mononitrate (IMDUR) 30 MG 24 hr tablet Take 1 tablet (30 mg total) by mouth daily.      . metoprolol (LOPRESSOR) 50 MG tablet Take 50 mg by mouth daily.      . nitroGLYCERIN (NITROSTAT) 0.4 MG SL tablet Place 1 tablet (0.4 mg total) under the tongue every 5 (five) minutes as needed for chest pain.  25 tablet  3  . rosuvastatin (CRESTOR) 20 MG tablet Take 1 tablet (20 mg total) by mouth daily.  90 tablet  4  . Tamsulosin HCl (FLOMAX) 0.4 MG CAPS Take 0.4 mg by mouth at bedtime.       Marland Kitchen warfarin (COUMADIN) 5 MG tablet Take 1 tablet (5 mg total) by mouth daily.  30 tablet  3  . DISCONTD: isosorbide mononitrate (IMDUR) 30 MG 24 hr tablet Take 1 tablet (30 mg total) by mouth daily.  30 tablet  6    SURGICAL HISTORY:  Past Surgical History  Procedure Date  . Nose surgery 1985    deviated septum  . Total knee arthroplasty 2009    left  . Joint replacement 2009    left knee  . Appendectomy 1930's  . Tonsillectomy and adenoidectomy 1930's  . Coronary angioplasty with stent placement  3 procedures; 3 stents total  . Cataract extraction w/ intraocular lens  implant, bilateral   . Surgery scrotal / testicular 1971    "testicle crushed; removed"    REVIEW OF SYSTEMS:  A comprehensive review of systems was negative except for: Integument/breast: positive for pruritus all over but more in the back   PHYSICAL EXAMINATION: General appearance: alert, cooperative and no distress Neck: no adenopathy Lymph nodes: Cervical, supraclavicular, and axillary nodes normal. Resp: clear to auscultation bilaterally Cardio: regular rate and rhythm, S1, S2 normal, no murmur, click, rub or gallop GI: soft, non-tender; bowel sounds normal; no masses,  no organomegaly Extremities: extremities normal, atraumatic, no cyanosis or edema Neurologic: Alert and oriented X 3, normal strength and tone. Normal symmetric  reflexes. Normal coordination and gait  ECOG PERFORMANCE STATUS: 1 - Symptomatic but completely ambulatory  Blood pressure 143/74, pulse 80, temperature 97.1 F (36.2 C), temperature source Oral, height 6\' 1"  (1.854 m), weight 195 lb 14.4 oz (88.86 kg).  LABORATORY DATA: Lab Results  Component Value Date   WBC 3.4* 05/19/2011   HGB 9.5* 05/19/2011   HCT 28.9* 05/19/2011   MCV 98.5* 05/19/2011   PLT 118* 05/19/2011      Chemistry      Component Value Date/Time   NA 138 04/07/2011 1355   K 4.4 04/07/2011 1355   CL 106 04/07/2011 1355   CO2 18* 04/07/2011 1355   BUN 40* 04/07/2011 1355   CREATININE 2.66* 04/07/2011 1355      Component Value Date/Time   CALCIUM 9.2 04/07/2011 1355   ALKPHOS 79 04/07/2011 1355   AST 16 04/07/2011 1355   ALT 18 04/07/2011 1355   BILITOT 0.7 04/07/2011 1355       RADIOGRAPHIC STUDIES: No results found.  ASSESSMENT: This is a very pleasant 78 years old white male with pancytopenia of unknown etiology. I discussed the lab result with the patient and his wife today.  PLAN: I recommended for him to have a bone marrow biopsy and aspirate performed within the next one to 2 weeks to rule out myelodysplasia. I would see him back for followup visit in 3 weeks for discussion of his biopsy results and recommendation regarding treatment of this condition. The patient and his wife agreed to the current plan.  All questions were answered. The patient knows to call the clinic with any problems, questions or concerns. We can certainly see the patient much sooner if necessary.

## 2011-05-27 ENCOUNTER — Ambulatory Visit (INDEPENDENT_AMBULATORY_CARE_PROVIDER_SITE_OTHER): Payer: Medicare Other | Admitting: Pharmacist

## 2011-05-27 DIAGNOSIS — Z7901 Long term (current) use of anticoagulants: Secondary | ICD-10-CM

## 2011-05-27 DIAGNOSIS — I4891 Unspecified atrial fibrillation: Secondary | ICD-10-CM

## 2011-05-27 LAB — POCT INR: INR: 2.4

## 2011-06-01 ENCOUNTER — Other Ambulatory Visit (HOSPITAL_COMMUNITY): Payer: Self-pay | Admitting: *Deleted

## 2011-06-03 ENCOUNTER — Encounter (HOSPITAL_COMMUNITY)
Admission: RE | Admit: 2011-06-03 | Discharge: 2011-06-03 | Disposition: A | Discharge status: Home / Self Care | Payer: Medicare Other | Source: Ambulatory Visit | Attending: Nephrology | Admitting: Nephrology

## 2011-06-03 DIAGNOSIS — D649 Anemia, unspecified: Secondary | ICD-10-CM | POA: Insufficient documentation

## 2011-06-03 MED ORDER — FERUMOXYTOL INJECTION 510 MG/17 ML
INTRAVENOUS | Status: AC
  Administered 2011-06-03: 510 mg via INTRAVENOUS
  Filled 2011-06-03: qty 17

## 2011-06-03 MED ORDER — SODIUM CHLORIDE 0.9 % IV SOLN: INTRAVENOUS | Status: DC

## 2011-06-03 MED ORDER — FERUMOXYTOL INJECTION 510 MG/17 ML
510.0000 mg | INTRAVENOUS | Status: DC
  Administered 2011-06-03: 510 mg via INTRAVENOUS

## 2011-06-09 ENCOUNTER — Ambulatory Visit (HOSPITAL_BASED_OUTPATIENT_CLINIC_OR_DEPARTMENT_OTHER): Payer: Medicare Other | Admitting: Internal Medicine

## 2011-06-09 ENCOUNTER — Other Ambulatory Visit (HOSPITAL_BASED_OUTPATIENT_CLINIC_OR_DEPARTMENT_OTHER): Payer: Medicare Other | Admitting: Lab

## 2011-06-09 ENCOUNTER — Telehealth: Payer: Self-pay | Admitting: Internal Medicine

## 2011-06-09 ENCOUNTER — Telehealth: Payer: Self-pay | Admitting: Medical Oncology

## 2011-06-09 ENCOUNTER — Other Ambulatory Visit (HOSPITAL_COMMUNITY): Payer: Self-pay | Admitting: *Deleted

## 2011-06-09 DIAGNOSIS — D61818 Other pancytopenia: Secondary | ICD-10-CM

## 2011-06-09 LAB — CBC WITH DIFFERENTIAL/PLATELET
BASO%: 0.3 % (ref 0.0–2.0)
Basophils Absolute: 0 10*3/uL (ref 0.0–0.1)
EOS%: 4.3 % (ref 0.0–7.0)
HGB: 9.1 g/dL — ABNORMAL LOW (ref 13.0–17.1)
MCH: 32.4 pg (ref 27.2–33.4)
MCV: 99 fL — ABNORMAL HIGH (ref 79.3–98.0)
MONO%: 5.7 % (ref 0.0–14.0)
RBC: 2.81 10*6/uL — ABNORMAL LOW (ref 4.20–5.82)
RDW: 17.5 % — ABNORMAL HIGH (ref 11.0–14.6)
lymph#: 0.9 10*3/uL (ref 0.9–3.3)

## 2011-06-09 NOTE — Telephone Encounter (Signed)
gv pt appt for april2013 

## 2011-06-09 NOTE — Telephone Encounter (Signed)
Scheduled bone marrow asp/bx with short stay and flow cytometry for 06/18/11 at 0800. Pt instructed to arrive at 0645 and directions given . Pre-procedure guidelines reviewed with pt who voices understanding.

## 2011-06-09 NOTE — Progress Notes (Signed)
[facility] Telephone:(336) [phone]   Fax:([phone]  OFFICE PROGRESS NOTE  [name], MD, MD [address] [place] [address]  DIAGNOSIS: Pancytopenia of unknown etiology.   PRIOR THERAPY: None   CURRENT THERAPY: Weekly Procrit under the care of Dr. Caryn Section for anemia of chronic disease.   INTERVAL HISTORY: Tracy [name] 78 y.o. male returns to the clinic today for followup appointment to come to his wife. The patient is feeling fine today with no specific complaints. He denied having any significant weakness or fatigue. He has no chest pain or shortness of breath. No weight loss or night sweats. No bleeding issues. He was supposed to have bone marrow biopsy and aspirate performed before this visit by interventional radiology but the patient did not receive any call for his appointment. He still has some itching. He is currently on weekly treatment with Procrit under the care of Dr. Caryn Section.  MEDICAL HISTORY: Past Medical History  Diagnosis Date  . CAD (coronary artery disease)     status post prior PCI to the obtuse marginal in 1997 and stenting to the mid to distal RCA in 2001; LHC 9/06:  LM ok, pLAD 50%, mLAD 60% then 60-70%, mRI 50%, mOM1 80-90% (small), pRCA and dRCA stents ok, PDA 50%, EF 65%;  Myoview 5/12: No ischemia, EF 64%.;  NSTEMI 11/12 tx medically   . Chronic kidney disease, stage III (moderate)   . TIA (transient ischemic attack)   . HTN (hypertension)   . Hyperlipidemia   . AF (atrial fibrillation) 02/16/11    Coumadin  . Arthritis     "in my left ankle"  . Chronic diastolic heart failure   . Anemia of chronic disease     ALLERGIES:  is allergic to oxycodone hcl and prednisone.  MEDICATIONS:  Current Outpatient Prescriptions  Medication Sig Dispense Refill  . amLODipine (NORVASC) 5 MG tablet 10 mg daily.       . cloNIDine (CATAPRES) 0.1 MG tablet Take 0.1 mg by mouth daily.        Marland Kitchen epoetin alfa (EPOGEN,PROCRIT) 95621 UNIT/ML  injection Inject 20,000 Units into the skin every 21 ( twenty-one) days.      . isosorbide mononitrate (IMDUR) 30 MG 24 hr tablet Take 1 tablet (30 mg total) by mouth daily.      . metoprolol (LOPRESSOR) 50 MG tablet Take 50 mg by mouth daily.      . rosuvastatin (CRESTOR) 20 MG tablet Take 1 tablet (20 mg total) by mouth daily.  90 tablet  4  . Tamsulosin HCl (FLOMAX) 0.4 MG CAPS Take 0.4 mg by mouth at bedtime.       Marland Kitchen warfarin (COUMADIN) 5 MG tablet Take 1 tablet (5 mg total) by mouth daily.  30 tablet  3  . acetaminophen (TYLENOL) 325 MG tablet Take 650 mg by mouth as needed. pain      . nitroGLYCERIN (NITROSTAT) 0.4 MG SL tablet Place 1 tablet (0.4 mg total) under the tongue every 5 (five) minutes as needed for chest pain.  25 tablet  3  . DISCONTD: isosorbide mononitrate (IMDUR) 30 MG 24 hr tablet Take 1 tablet (30 mg total) by mouth daily.  30 tablet  6    SURGICAL HISTORY:  Past Surgical History  Procedure Date  . Nose surgery 1985    deviated septum  . Total knee arthroplasty 2009    left  . Joint replacement 2009    left knee  .  Appendectomy 1930's  . Tonsillectomy and adenoidectomy 1930's  . Coronary angioplasty with stent placement     3 procedures; 3 stents total  . Cataract extraction w/ intraocular lens  implant, bilateral   . Surgery scrotal / testicular 1971    "testicle crushed; removed"    REVIEW OF SYSTEMS:  A comprehensive review of systems was negative.   PHYSICAL EXAMINATION: General appearance: alert, cooperative and no distress Lymph nodes: Cervical, supraclavicular, and axillary nodes normal. Resp: clear to auscultation bilaterally Cardio: regular rate and rhythm, S1, S2 normal, no murmur, click, rub or gallop GI: soft, non-tender; bowel sounds normal; no masses,  no organomegaly Extremities: extremities normal, atraumatic, no cyanosis or edema  ECOG PERFORMANCE STATUS: 1 - Symptomatic but completely ambulatory  Blood pressure 132/81, pulse 69,  temperature 96.6 F (35.9 C), temperature source Oral, height 5\' 10"  (1.778 m), weight 195 lb 12.8 oz (88.814 kg).  LABORATORY DATA: Lab Results  Component Value Date   WBC 3.5* 06/09/2011   HGB 9.1* 06/09/2011   HCT 27.8* 06/09/2011   MCV 99.0* 06/09/2011   PLT 129* 06/09/2011      Chemistry      Component Value Date/Time   NA 138 04/07/2011 1355   K 4.4 04/07/2011 1355   CL 106 04/07/2011 1355   CO2 18* 04/07/2011 1355   BUN 40* 04/07/2011 1355   CREATININE 2.66* 04/07/2011 1355      Component Value Date/Time   CALCIUM 9.2 04/07/2011 1355   ALKPHOS 79 04/07/2011 1355   AST 16 04/07/2011 1355   ALT 18 04/07/2011 1355   BILITOT 0.7 04/07/2011 1355       RADIOGRAPHIC STUDIES: No results found.  ASSESSMENT: This is a very pleasant 78 years old white male with pancytopenia of unknown etiology suspicious for myelodysplastic syndrome. His CBC today is stable.  PLAN:  I will arrange for the patient to have a bone marrow biopsy and aspirate on 06/18/2011. I would see him back for followup visit in 3 weeks for evaluation and discussion of his biopsy results. He was advised to continue on Procrit injection as recommended by Dr. Caryn Section.  All questions were answered. The patient knows to call the clinic with any problems, questions or concerns. We can certainly see the patient much sooner if necessary.

## 2011-06-10 ENCOUNTER — Encounter (HOSPITAL_COMMUNITY)
Admission: RE | Admit: 2011-06-10 | Discharge: 2011-06-10 | Disposition: A | Discharge status: Home / Self Care | Payer: Medicare Other | Source: Ambulatory Visit | Attending: Nephrology | Admitting: Nephrology

## 2011-06-10 MED ORDER — SODIUM CHLORIDE 0.9 % IV SOLN: Freq: Once | INTRAVENOUS | Status: DC

## 2011-06-10 MED ORDER — FERUMOXYTOL INJECTION 510 MG/17 ML
INTRAVENOUS | Status: AC
  Administered 2011-06-10: 510 mg via INTRAVENOUS
  Filled 2011-06-10: qty 17

## 2011-06-10 MED ORDER — FERUMOXYTOL INJECTION 510 MG/17 ML
510.0000 mg | Freq: Once | INTRAVENOUS | Status: AC
  Administered 2011-06-10: 510 mg via INTRAVENOUS

## 2011-06-10 NOTE — Progress Notes (Signed)
Patient reported that he itches after every Procrit injection he gets at  The doctor's office. Called and left a message at [facility] for Joanette Gula, CMA informing her of this. Told patient to tell the staff at [facility] before he gets next injection.

## 2011-06-16 ENCOUNTER — Other Ambulatory Visit: Payer: Self-pay | Admitting: Internal Medicine

## 2011-06-17 ENCOUNTER — Encounter (HOSPITAL_COMMUNITY): Payer: Self-pay | Admitting: Pharmacy Technician

## 2011-06-18 ENCOUNTER — Ambulatory Visit (HOSPITAL_COMMUNITY)
Admission: RE | Admit: 2011-06-18 | Discharge: 2011-06-18 | Disposition: A | Discharge status: Home / Self Care | Payer: Medicare Other | Source: Ambulatory Visit | Attending: Internal Medicine | Admitting: Internal Medicine

## 2011-06-18 ENCOUNTER — Encounter (HOSPITAL_COMMUNITY): Payer: Self-pay

## 2011-06-18 DIAGNOSIS — D61818 Other pancytopenia: Secondary | ICD-10-CM | POA: Insufficient documentation

## 2011-06-18 LAB — DIFFERENTIAL
Eosinophils Absolute: 0.2 10*3/uL (ref 0.0–0.7)
Lymphocytes Relative: 24 % (ref 12–46)
Lymphs Abs: 0.8 10*3/uL (ref 0.7–4.0)
Monocytes Relative: 7 % (ref 3–12)
Neutro Abs: 2.1 10*3/uL (ref 1.7–7.7)
Neutrophils Relative %: 65 % (ref 43–77)

## 2011-06-18 LAB — CBC
Hemoglobin: 8.9 g/dL — ABNORMAL LOW (ref 13.0–17.0)
MCH: 32.1 pg (ref 26.0–34.0)
Platelets: 125 10*3/uL — ABNORMAL LOW (ref 150–400)
RBC: 2.77 MIL/uL — ABNORMAL LOW (ref 4.22–5.81)
WBC: 3.2 10*3/uL — ABNORMAL LOW (ref 4.0–10.5)

## 2011-06-18 MED ORDER — MIDAZOLAM HCL 5 MG/5ML IJ SOLN
INTRAMUSCULAR | Status: AC | PRN
Start: 2011-06-18 — End: 2011-06-18
  Administered 2011-06-18: 2 mg via INTRAVENOUS

## 2011-06-18 MED ORDER — MIDAZOLAM HCL 10 MG/2ML IJ SOLN
INTRAMUSCULAR | Status: AC
  Administered 2011-06-18: 2 mg
  Filled 2011-06-18: qty 2

## 2011-06-18 MED ORDER — MEPERIDINE HCL 50 MG/ML IJ SOLN
INTRAMUSCULAR | Status: AC
  Filled 2011-06-18: qty 1

## 2011-06-18 MED ORDER — SODIUM CHLORIDE 0.9 % IV SOLN
Freq: Once | INTRAVENOUS | Status: AC
  Administered 2011-06-18: 500 mL via INTRAVENOUS

## 2011-06-18 NOTE — ED Notes (Signed)
Patient is resting comfortably. 

## 2011-06-18 NOTE — ED Notes (Signed)
dsg cdi 

## 2011-06-18 NOTE — Procedures (Addendum)
Bone Marrow Biopsy and Aspiration Procedure Note  Mallampati's class: 1 ASA class: 1 Informed consent was obtained and potential risks including bleeding, infection and pain were reviewed with the patient.   Posterior iliac crest(s) prepped with Betadine.   Lidocaine 2% local anesthesia infiltrated into the subcutaneous tissue.  Left bone marrow biopsy was tried several times but was unable to extract a core out and left bone marrow aspirate was obtained.   The procedure was tolerated well and there were no complications.  Specimens sent for: routine histopathologic stains and sectioning, flow cytometry and cytogenetics  Physician: [name]

## 2011-06-18 NOTE — ED Notes (Signed)
md has finished with his procedure  dsg applied  cdi at this time

## 2011-06-18 NOTE — ED Notes (Signed)
Patient denies pain and is resting comfortably.  

## 2011-06-18 NOTE — ED Notes (Signed)
Family updated as to patient's status.

## 2011-06-18 NOTE — Sedation Documentation (Signed)
Medication dose calculated and verified for versed 2mg 

## 2011-06-18 NOTE — ED Notes (Signed)
Vital signs stable. 

## 2011-06-18 NOTE — ED Notes (Signed)
Family updated as to patient's status.  Wife returned to room

## 2011-06-18 NOTE — Discharge Instructions (Signed)
Biopsy A biopsy is a procedure in which small samples of tissue are removed from the body. The tissue is examined under a microscope. A biopsy may be done to determine the cause (diagnosis) of a condition or mass (tumor). A biopsy may also be done to determine the Dub[place] treatment for you. In some instances, a biopsy may be performed on normal tissue to determine if cancer has spread or if a transplanted organ is being rejected. There are 2 ways to obtain samples:  Fine needle biopsy. Samples are removed using a thin needle inserted through the skin.   Open biopsy. Samples are removed after a cut (incision) is made through the skin.  LET YOUR CAREGIVER KNOW ABOUT:  Allergies to food or medicine.   Medicines taken, including vitamins, herbs, eyedrops, over-the-counter medicines, and creams.   Use of steroids (by mouth or creams).   Previous problems with anesthetics or numbing medicines.   History of bleeding problems or blood clots.   Previous surgery.   Other health problems, including diabetes and kidney problems.   Possibility of pregnancy, if this applies.  RISKS AND COMPLICATIONS  Bleeding from the biopsy site. The risk of bleeding is higher if you have a bleeding disorder or are taking any blood thinning medicines (anticoagulants).   Infection.   Injury to organs or structures near the biopsy site.   Chronic pain at the biopsy site. This is defined as pain that lasts for more than 3 months.   Very rarely, a second biopsy may be required if not enough tissue was collected during the first biopsy.  BEFORE THE PROCEDURE Ask your caregiver what time you need to arrive for your procedure. Ask your caregiver whether you need to stop eating or drinking (fast) before your procedure. Ask your caregiver about changing or stopping your regular medicines. A blood sample may be done to determine your blood clotting time. Medicine may be given to help you relax (sedative). PROCEDURE During  a fine needle biopsy, you will be awake during the procedure. You will be positioned to allow the [place] possible access to the biopsy site. Let your caregiver know if the position is not comfortable. The biopsy site will be cleaned. A needle is inserted through your skin. You may feel mild discomfort during this procedure. The needle is withdrawn once tissue samples have been removed. Pressure may be applied to the biopsy site to reduce swelling and to ensure that bleeding has stopped. The samples will be sent to be examined. During an open biopsy, you may be given medicine that numbs the area (local anesthetic) or medicine that makes you sleep (general anesthetic). An incision is made through the skin. A tissue sample or the entire mass is removed. The sample or mass will be sent to be examined. Sometimes, the sample or mass may be examined during the procedure. If the sample or mass contains cancer cells, further tissue or structures may be removed. The incision is then closed with stitches (sutures) or skin glue (adhesive). AFTER THE PROCEDURE Your recovery will be assessed and monitored. If there are no problems, you should be able to go home shortly after the procedure (outpatient). You will need to arrange for someone to drive you home if you received a sedative or pain relieving medicine during the procedure. Ask when your test results will be ready. Make sure you get your test results. Document Released: 03/12/2000 Document Revised: 03/04/2011 Document Reviewed: 09/10/2010 [facility] Patient Information 2012 [place], [place].  Do not drive  For 24 hours Do not go into public places today May resume your regular diet and take home medications as usual May experience small amount of tingling in leg (biopsy side) May take shower and remove bandage in am For any questions or concerns, call dr If bleeding occurs at site, hold pressure x10 minutes  If continues, call doctor

## 2011-06-24 ENCOUNTER — Ambulatory Visit (INDEPENDENT_AMBULATORY_CARE_PROVIDER_SITE_OTHER): Payer: Medicare Other | Admitting: *Deleted

## 2011-06-24 DIAGNOSIS — I4891 Unspecified atrial fibrillation: Secondary | ICD-10-CM

## 2011-06-24 DIAGNOSIS — Z7901 Long term (current) use of anticoagulants: Secondary | ICD-10-CM

## 2011-06-24 LAB — POCT INR: INR: 3

## 2011-06-30 ENCOUNTER — Ambulatory Visit (HOSPITAL_BASED_OUTPATIENT_CLINIC_OR_DEPARTMENT_OTHER): Payer: Medicare Other | Admitting: Internal Medicine

## 2011-06-30 ENCOUNTER — Telehealth: Payer: Self-pay | Admitting: Internal Medicine

## 2011-06-30 ENCOUNTER — Other Ambulatory Visit (HOSPITAL_BASED_OUTPATIENT_CLINIC_OR_DEPARTMENT_OTHER): Payer: Medicare Other | Admitting: Lab

## 2011-06-30 DIAGNOSIS — D61818 Other pancytopenia: Secondary | ICD-10-CM

## 2011-06-30 DIAGNOSIS — I4891 Unspecified atrial fibrillation: Secondary | ICD-10-CM

## 2011-06-30 DIAGNOSIS — Z7901 Long term (current) use of anticoagulants: Secondary | ICD-10-CM

## 2011-06-30 DIAGNOSIS — R5381 Other malaise: Secondary | ICD-10-CM

## 2011-06-30 NOTE — Telephone Encounter (Signed)
appts made and printed for pt aom °

## 2011-06-30 NOTE — Progress Notes (Signed)
[facility] Telephone:(336) [phone]   Fax:(336) [phone]  OFFICE PROGRESS NOTE  Tracy Lek, MD, MD [address] [place] [address]  DIAGNOSIS: Pancytopenia of unknown etiology questionable for MDS.   PRIOR THERAPY: None   CURRENT THERAPY: Weekly Procrit under the care of Dr. Caryn Section for anemia of chronic disease.   INTERVAL HISTORY: Tracy [name] 78 y.o. male returns to the clinic today for followup visit accompanied by his wife. The patient is feeling fine today with no specific complaints except for the occasional itching with a band like distribution from the back to the umbilical area. The patient was treated by Dr. Doristine Counter for questionable scabies. He has a bone marrow biopsy and aspirate performed recently and he is here today for evaluation and discussion of his biopsy results. He continues to have mild fatigue but no dizzy spells. No recurrent infection or bleeding issues.  MEDICAL HISTORY: Past Medical History  Diagnosis Date  . CAD (coronary artery disease)     status post prior PCI to the obtuse marginal in 1997 and stenting to the mid to distal RCA in 2001; LHC 9/06:  LM ok, pLAD 50%, mLAD 60% then 60-70%, mRI 50%, mOM1 80-90% (small), pRCA and dRCA stents ok, PDA 50%, EF 65%;  Myoview 5/12: No ischemia, EF 64%.;  NSTEMI 11/12 tx medically   . Chronic kidney disease, stage III (moderate)   . TIA (transient ischemic attack)   . HTN (hypertension)   . Hyperlipidemia   . AF (atrial fibrillation) 02/16/11    Coumadin  . Arthritis     "in my left ankle"  . Chronic diastolic heart failure   . Anemia of chronic disease     ALLERGIES:  is allergic to oxycodone hcl and prednisone.  MEDICATIONS:  Current Outpatient Prescriptions  Medication Sig Dispense Refill  . acetaminophen (TYLENOL) 325 MG tablet Take 650 mg by mouth every 6 (six) hours as needed.      Marland Kitchen amLODipine (NORVASC) 10 MG tablet Take 10 mg by mouth at bedtime.      . cloNIDine  (CATAPRES) 0.1 MG tablet Take 0.1 mg by mouth at bedtime.       Marland Kitchen epoetin alfa (EPOGEN,PROCRIT) 81191 UNIT/ML injection Inject 20,000 Units into the skin every 14 (fourteen) days.       . isosorbide mononitrate (IMDUR) 30 MG 24 hr tablet Take 30 mg by mouth at bedtime.       . metoprolol (LOPRESSOR) 50 MG tablet Take 50 mg by mouth at bedtime.       . nitroGLYCERIN (NITROSTAT) 0.4 MG SL tablet Place 0.4 mg under the tongue every 5 (five) minutes as needed. Chest pain      . rosuvastatin (CRESTOR) 20 MG tablet Take 10 mg by mouth at bedtime.      . Tamsulosin HCl (FLOMAX) 0.4 MG CAPS Take 0.4 mg by mouth at bedtime.       Marland Kitchen warfarin (COUMADIN) 5 MG tablet Take 2.5-5 mg by mouth daily. Pt takes 2.5mg  on Sunday and Thursday and all other days is 5 mg      . fexofenadine (ALLEGRA) 180 MG tablet Take 180 mg by mouth daily.      Marland Kitchen DISCONTD: cloNIDine (CATAPRES) 0.1 MG tablet Take 1 tablet (0.1 mg total) by mouth 2 (two) times daily.  60 tablet  11  . DISCONTD: isosorbide mononitrate (IMDUR) 30 MG 24 hr tablet Take 1 tablet (30 mg total) by mouth daily.  30 tablet  6    SURGICAL HISTORY:  Past Surgical History  Procedure Date  . Nose surgery 1985    deviated septum  . Total knee arthroplasty 2009    left  . Joint replacement 2009    left knee  . Appendectomy 1930's  . Tonsillectomy and adenoidectomy 1930's  . Coronary angioplasty with stent placement     3 procedures; 3 stents total  . Cataract extraction w/ intraocular lens  implant, bilateral   . Surgery scrotal / testicular 1971    "testicle crushed; removed"    REVIEW OF SYSTEMS:  A comprehensive review of systems was negative except for: Constitutional: positive for fatigue and weight loss   PHYSICAL EXAMINATION: General appearance: alert, cooperative, fatigued and no distress Neck: no adenopathy Lymph nodes: Cervical, supraclavicular, and axillary nodes normal. Resp: clear to auscultation bilaterally Cardio: regular rate and  rhythm, S1, S2 normal, no murmur, click, rub or gallop GI: soft, non-tender; bowel sounds normal; no masses,  no organomegaly Extremities: extremities normal, atraumatic, no cyanosis or edema  ECOG PERFORMANCE STATUS: 1 - Symptomatic but completely ambulatory  Blood pressure 151/81, pulse 75, temperature 97 F (36.1 C), temperature source Oral, height 5\' 10"  (1.778 m), weight 191 lb (86.637 kg).  LABORATORY DATA: Lab Results  Component Value Date   WBC 3.2* 06/18/2011   HGB 8.9* 06/18/2011   HCT 27.4* 06/18/2011   MCV 98.9 06/18/2011   PLT 125* 06/18/2011      Chemistry      Component Value Date/Time   NA 138 04/07/2011 1355   K 4.4 04/07/2011 1355   CL 106 04/07/2011 1355   CO2 18* 04/07/2011 1355   BUN 40* 04/07/2011 1355   CREATININE 2.66* 04/07/2011 1355      Component Value Date/Time   CALCIUM 9.2 04/07/2011 1355   ALKPHOS 79 04/07/2011 1355   AST 16 04/07/2011 1355   ALT 18 04/07/2011 1355   BILITOT 0.7 04/07/2011 1355       RADIOGRAPHIC STUDIES: BONE MARROW REPORT FINAL DIAGNOSIS Diagnosis Bone Marrow, Aspirate,Biopsy, and Clot, left superior posterior iliac crest - HYPERCELLULAR BONE MARROW FOR AGE WITH DYSPOIETIC CHANGES. - SEE COMMENT. PERIPHERAL BLOOD: - PANCYTOPENIA. Diagnosis Note There are variable but generally mild dyspoietic changes involving all cell lines but with progressive maturation and no increase in blasts. Given the mild nature of the process, it is unclear whether this represents a myelodysplastic state (MDS) versus a secondary change. Correlation with cytogenetic studies is strongly recommended. (BNS:mw 06-21-11) Guerry Bruin MD Pathologist, Electronic Signature (Case signed 06/21/2011)  ASSESSMENT: This is a very pleasant 78 years old white male with pancytopenia questionable for myelodysplastic syndrome versus secondary changes based on the recent bone marrow biopsy. Patient is currently asymptomatic except for mild fatigue.  PLAN: I discussed the biopsy  results with the patient and his wife. I recommended for him to continue on observation for now. I would see him back for followup visit in 3 months with repeat CBC, comprehensive metabolic panel and LDH The patient was advised to call me immediately if he has any concerning symptoms in the interval. He will continue his routine followup visit with his primary care physician Dr. Doristine Counter All questions were answered. The patient knows to call the clinic with any problems, questions or concerns. We can certainly see the patient much sooner if necessary.

## 2011-07-02 ENCOUNTER — Other Ambulatory Visit: Payer: Self-pay | Admitting: Cardiology

## 2011-07-22 ENCOUNTER — Ambulatory Visit (INDEPENDENT_AMBULATORY_CARE_PROVIDER_SITE_OTHER): Payer: Medicare Other | Admitting: *Deleted

## 2011-07-22 DIAGNOSIS — Z7901 Long term (current) use of anticoagulants: Secondary | ICD-10-CM

## 2011-07-22 DIAGNOSIS — I4891 Unspecified atrial fibrillation: Secondary | ICD-10-CM

## 2011-07-22 LAB — POCT INR: INR: 1.3

## 2011-07-23 ENCOUNTER — Other Ambulatory Visit: Payer: Self-pay | Admitting: Dermatology

## 2011-08-01 ENCOUNTER — Other Ambulatory Visit: Payer: Self-pay | Admitting: Cardiology

## 2011-08-02 NOTE — Telephone Encounter (Signed)
..   Requested Prescriptions   Pending Prescriptions Disp Refills  . cloNIDine (CATAPRES) 0.1 MG tablet [Pharmacy Med Name: CLONIDINE 0.1MG      TAB] 60 tablet 1    Sig: TAKE ONE TABLET BY MOUTH TWICE DAILY

## 2011-08-03 ENCOUNTER — Telehealth: Payer: Self-pay | Admitting: Cardiology

## 2011-08-03 ENCOUNTER — Encounter: Payer: Self-pay | Admitting: Physician Assistant

## 2011-08-03 NOTE — Telephone Encounter (Signed)
Pt calling re SOB °

## 2011-08-03 NOTE — Telephone Encounter (Signed)
Per pt - having SOB that has been occuring since he was last in the hospital.  Last night he felt faint while eating and had to stop eating to make it improve.  Reports having to take a deep breath at time to feel like he is getting his breath.  Per wife - pt sits down at the top of the stairs in order to come down them due to feeling faint.  Appointment given with PA tomorrow at 11:10 am

## 2011-08-04 ENCOUNTER — Encounter: Payer: Self-pay | Admitting: Physician Assistant

## 2011-08-04 ENCOUNTER — Ambulatory Visit (INDEPENDENT_AMBULATORY_CARE_PROVIDER_SITE_OTHER): Payer: Medicare Other | Admitting: Pharmacist

## 2011-08-04 ENCOUNTER — Ambulatory Visit (INDEPENDENT_AMBULATORY_CARE_PROVIDER_SITE_OTHER)
Admission: RE | Admit: 2011-08-04 | Discharge: 2011-08-04 | Disposition: A | Discharge status: Home / Self Care | Payer: Medicare Other | Source: Ambulatory Visit | Attending: Physician Assistant | Admitting: Physician Assistant

## 2011-08-04 ENCOUNTER — Ambulatory Visit (INDEPENDENT_AMBULATORY_CARE_PROVIDER_SITE_OTHER): Payer: Medicare Other | Admitting: Physician Assistant

## 2011-08-04 VITALS — BP 120/70 | HR 73 | Ht 70.0 in | Wt 187.0 lb

## 2011-08-04 DIAGNOSIS — I951 Orthostatic hypotension: Secondary | ICD-10-CM

## 2011-08-04 DIAGNOSIS — R0602 Shortness of breath: Secondary | ICD-10-CM

## 2011-08-04 DIAGNOSIS — Z7901 Long term (current) use of anticoagulants: Secondary | ICD-10-CM

## 2011-08-04 DIAGNOSIS — D61818 Other pancytopenia: Secondary | ICD-10-CM

## 2011-08-04 DIAGNOSIS — R5383 Other fatigue: Secondary | ICD-10-CM

## 2011-08-04 DIAGNOSIS — I4891 Unspecified atrial fibrillation: Secondary | ICD-10-CM

## 2011-08-04 DIAGNOSIS — I2510 Atherosclerotic heart disease of native coronary artery without angina pectoris: Secondary | ICD-10-CM

## 2011-08-04 DIAGNOSIS — I5032 Chronic diastolic (congestive) heart failure: Secondary | ICD-10-CM

## 2011-08-04 DIAGNOSIS — I509 Heart failure, unspecified: Secondary | ICD-10-CM

## 2011-08-04 DIAGNOSIS — N259 Disorder resulting from impaired renal tubular function, unspecified: Secondary | ICD-10-CM

## 2011-08-04 LAB — RENAL FUNCTION PANEL
Albumin: 3.8 g/dL (ref 3.5–5.2)
CO2: 20 mEq/L (ref 19–32)
Chloride: 108 mEq/L (ref 96–112)
GFR: 20.84 mL/min — ABNORMAL LOW (ref >60.00)
Phosphorus: 4.9 mg/dL — ABNORMAL HIGH (ref 2.3–4.6)
Potassium: 5.1 mEq/L (ref 3.5–5.1)

## 2011-08-04 LAB — BASIC METABOLIC PANEL
BUN: 60 mg/dL — ABNORMAL HIGH (ref 6–23)
Chloride: 108 mEq/L (ref 96–112)
GFR: 20.84 mL/min — ABNORMAL LOW (ref >60.00)
Potassium: 5.1 mEq/L (ref 3.5–5.1)
Sodium: 137 mEq/L (ref 135–145)

## 2011-08-04 LAB — CBC WITH DIFFERENTIAL/PLATELET
Basophils Relative: 0.1 % (ref 0.0–3.0)
Eosinophils Relative: 2.7 % (ref 0.0–5.0)
HCT: 26.2 % — ABNORMAL LOW (ref 39.0–52.0)
Lymphs Abs: 0.7 10*3/uL (ref 0.7–4.0)
MCV: 97.9 fl (ref 78.0–100.0)
Monocytes Relative: 11.6 % (ref 3.0–12.0)
Platelets: 129 10*3/uL — ABNORMAL LOW (ref 150.0–400.0)
RBC: 2.68 Mil/uL — ABNORMAL LOW (ref 4.22–5.81)
WBC: 3.6 10*3/uL — ABNORMAL LOW (ref 4.5–10.5)

## 2011-08-04 LAB — TSH: TSH: 2.36 u[IU]/mL (ref 0.35–5.50)

## 2011-08-04 LAB — POCT INR: INR: 2.1

## 2011-08-04 LAB — BRAIN NATRIURETIC PEPTIDE: Pro B Natriuretic peptide (BNP): 601 pg/mL — ABNORMAL HIGH (ref 0.0–100.0)

## 2011-08-04 NOTE — Patient Instructions (Addendum)
Your physician recommends that you schedule a follow-up appointment in: 5/28/ WITH DR. [facility]  Your physician recommends that you return for lab work in: TODAY BMET, CBC W/DIFF, TSH, BNP, RENAL PANEL, PTH I  PLEASE GO TO [facility] ON [address], TO HAVE A CHEST XRAY YOU NEED TO GO DOWN INTO THE BASEMENT WHEN YOU GET THERE  PLEASE SCHEDULE TO WEAR A 24 HOUR HOLTER MONITOR  DECREASE AMLODIPINE TO 5 MG DAILY  YOU HAVE BEEN GIVEN AN ORDER TO WEAR COMPRESSION STOCKINGS THIGH HIGH 20-30 mmHg FOR LOWER EXTREMITY EDEMA   WE WILL CHECK YOUR INR TODAY

## 2011-08-04 NOTE — Progress Notes (Signed)
[address] [place], [place]  [address] Phone: [phone] Fax:  [phone]  Date:  08/04/2011   Name:  Tracy [name]   DOB:  [birth year]   MRN:  [id number]  PCP:  [name], MD, MD  Primary Cardiologist:  Dr. [name]  Primary Electrophysiologist:  None    History of Present Illness: Tracy [name] is a 78 y.o. male who returns for dizziness and dyspnea.    He has a history of CAD, status post prior PCI to the OM in 1997 and stenting to the mid to distal RCA in 2001, HTN, hyperlipidemia, stage III CKD and prior TIA.  Last LHC 9/06: LM ok, pLAD 50%, mLAD 60% then 60-70%, mRI 50%, mOM1 80-90% (small), pRCA and dRCA stents ok, PDA 50%, EF 65%. Thus, he had high-grade small vessel disease and diffuse nonobstructive large vessel disease. This was treated medically. Last Myoview 5/12: No ischemia, EF 64%.   Admitted 01/2011 with NSTEMI in the setting of diastolic heart failure.  This was c/b AFib with RVR.  He was started on Coumadin. With his CKD, age and recent low risk Myoview, medical therapy was pursued.  Echocardiogram 02/17/11: Severe LVH, asymmetric hypertrophy, EF 50-55%, normal wall motion, high ventricular filling pressure, mild LAE, mild RVE, mildly reduced RVSF, mild RAE.   He has been evaluated by Dr. [name] for pancytopenia.  Had BM biopsy with possible dx of myelodysplastic syndrome.  Last saw Dr. [name] in 03/2011 with plans for 4 mos follow up.  Called in yesterday with lightheadedness and dyspnea.    He has noted dizziness and shortness of breath really since he was discharged from the hospital in November.  He describes orthostatic intolerance.  He denies syncope.  He did note feeling dizzy while eating recently.  However, he stood shortly after this. He denies orthopnea, PND or edema.  He describes class III dyspnea on exertion.  He describes some dyspnea at rest.  This has been fairly stable since discharge from the hospital in  November.  It may be more noticeable over the last 3 weeks.  He denies any weight gain.  He has actually lost weight over the last several months.  He notes sharp left-sided chest pain that may last 2-3 seconds.  He notes associated dyspnea.  Otherwise, no associated symptoms.  He feels that nitroglycerin helps.  He has had these symptoms since discharge from the hospital without significant change.  Wt Readings from Last 3 Encounters:  08/04/11 187 lb (84.823 kg)  06/30/11 191 lb (86.637 kg)  06/10/11 195 lb (88.451 kg)     Potassium  Date/Time Value Range Status  04/07/2011  1:55 PM 4.4  3.5-5.3 (mEq/L) Final     Creatinine, Ser  Date/Time Value Range Status  04/07/2011  1:55 PM 2.66* 0.50-1.35 (mg/dL) Final     Result repeated and verified.     ALT  Date/Time Value Range Status  04/07/2011  1:55 PM 18  0-53 (U/L) Final   Lab Results  Component Value Date   HGB 8.9* 06/18/2011      Past Medical History  Diagnosis Date  . CAD (coronary artery disease)     status post prior PCI to the obtuse marginal in 1997 and stenting to the mid to distal RCA in 2001; LHC 9/06:  LM ok, pLAD 50%, mLAD 60% then 60-70%, mRI 50%, mOM1 80-90% (small), pRCA and dRCA stents ok, PDA 50%, EF 65%;  Myoview 5/12: No ischemia,  EF 64%.;  NSTEMI 11/12 tx medically   . Chronic kidney disease, stage III (moderate)   . TIA (transient ischemic attack)   . HTN (hypertension)   . Hyperlipidemia   . AF (atrial fibrillation) 02/16/11    Coumadin  . Arthritis     "in my left ankle"  . Chronic diastolic heart failure   . Anemia of chronic disease   . Carotid stenosis     dopplers 03/2011: 0-39% bilateral    Current Outpatient Prescriptions  Medication Sig Dispense Refill  . acetaminophen (TYLENOL) 325 MG tablet Take 650 mg by mouth every 6 (six) hours as needed.      Marland Kitchen amLODipine (NORVASC) 10 MG tablet Take 10 mg by mouth at bedtime.      . cloNIDine (CATAPRES) 0.1 MG tablet TAKE ONE TABLET BY MOUTH TWICE DAILY   60 tablet  1  . fexofenadine (ALLEGRA) 180 MG tablet Take 180 mg by mouth daily.      . isosorbide mononitrate (IMDUR) 30 MG 24 hr tablet Take 30 mg by mouth at bedtime.       . metoprolol (LOPRESSOR) 50 MG tablet Take 50 mg by mouth at bedtime.       . nitroGLYCERIN (NITROSTAT) 0.4 MG SL tablet Place 0.4 mg under the tongue every 5 (five) minutes as needed. Chest pain      . rosuvastatin (CRESTOR) 20 MG tablet Take 10 mg by mouth at bedtime.      . Tamsulosin HCl (FLOMAX) 0.4 MG CAPS Take 0.4 mg by mouth at bedtime.       Marland Kitchen warfarin (COUMADIN) 5 MG tablet TAKE ONE TABLET BY MOUTH EVERY DAY  30 tablet  3  . DISCONTD: isosorbide mononitrate (IMDUR) 30 MG 24 hr tablet Take 1 tablet (30 mg total) by mouth daily.  30 tablet  6    Allergies: Allergies  Allergen Reactions  . Oxycodone Hcl Other (See Comments)    Wanted to climb the wall  . Prednisone Other (See Comments)    Gain weight     History  Substance Use Topics  . Smoking status: Former Smoker -- 2.0 packs/day for 30 years    Types: Cigarettes  . Smokeless tobacco: Never Used   Comment: 1980 quit smoking cigarrettes and pipe  . Alcohol Use: Yes     "glass of wine or beer once in awhile"     ROS:  Please see the history of present illness.   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  All other systems reviewed and negative.   PHYSICAL EXAM: VS:  BP 120/70  Pulse 73  Ht 5\' 10"  (1.778 m)  Wt 187 lb (84.823 kg)  BMI 26.83 kg/m2 O2 100% on RA;  With ambulation:  89%  Orthostatic VS: Lying:   BP 130/70 HR 68 Standing:   BP 116/59 HR 73 Standing:   BP 83/46   HR 57 After 2 min:   BP 105/63 HR 59 After 5 min:   BP 94/53   HR 72   Chronically ill appearing male in no acute distress HEENT: normal Neck: no JVD Endo: no thyromegaly  Cardiac:  normal S1, S2; irregularly irregular rhythm; no murmur Lungs:  Decreased breath sounds bilaterally, no wheezing, rhonchi or rales Abd: soft, nontender, no hepatomegaly Ext: no  edema Skin: warm and dry Neuro:  CNs 2-12 intact, no focal abnormalities noted Psych: normal affect   EKG:  AFib, HR 73, LAD, inc RBBB, TW inversion V5-6, no  change from prior tracing    ASSESSMENT AND PLAN:  1.    Dyspnea   -  Possibly multifactorial.   -  O2 sats do drop some with ambulation but he does not qualify for home O2.  He has a 60 pk yr smoking hx.   -  Question if he has underlying lung disease that is making symptoms worse.     -  Does not look volume overloaded on exam.  But, will check CXR and BNP today.  Diurese if needed.   -  Suspect a lot of his symptoms are related to AFib.  See below.    2.    Fatigue   -  Possibly related to AFib.     -  Check TSH and CBC today.   -  Adjust BP meds as noted below.  3.    Chest Pain   -  Overall stable symptoms since NSTEMI.   -  Try to restore NSR first.   -  If symptoms persist, will need to decide if he should undergo cardiac cath.   -  He is high risk for renal failure with cardiac cath with baseline CKD.  Hopefully this can be avoided.  4.    Orthostatic Hypotension   -  May explain his symptoms.   -  Decrease amlodipine to 5 mg QD   -  Get compression stockings.  5.    Atrial Fibrillation   -  Rate controlled.   -  Question if he would feel better in NSR.   -  Last INR subtherapeutic.   -  Discussed with Dr. [name] over the phone.  He has follow up with the patient later this month already scheduled.   -  Check INR today and weekly until seen in follow up.   -  Will keep an eye toward DCCV and can arrange at follow up.  6.    Chronic Kidney Disease   -  Check BMET today.  7.    Coronary Artery Disease   -  Continue current Rx.    8.    Pancytopenia   -  Question if anemia contributing to his overall symptoms.   -  Check CBC today.   -  At what point do we decide he needs transfusion with PRBCs?  9.    Chronic Diastolic CHF   -  Volume appears stable.   -  Check CXR and BNP as noted and diurese  if needed.  10.  Hypertension   -  Controlled.   -  But, he has significant orthostatic BP drop.   -  Adjust amlodipine as noted.   -  May need to cut back or eliminate clonidine as well.   Signed, Tereso Newcomer, PA-C  11:17 AM 08/04/2011

## 2011-08-05 ENCOUNTER — Telehealth: Payer: Self-pay | Admitting: *Deleted

## 2011-08-05 DIAGNOSIS — R0602 Shortness of breath: Secondary | ICD-10-CM

## 2011-08-05 LAB — PTH, INTACT AND CALCIUM
Calcium, Total (PTH): 9.8 mg/dL (ref 8.4–10.5)
PTH: 14 pg/mL (ref 14.0–72.0)

## 2011-08-05 NOTE — Telephone Encounter (Signed)
pt notified of cxr results and will have PFT's done @ [facility]. I told him that someone from scheduling will cb and make appt with him, gave me verbal understanding today

## 2011-08-12 ENCOUNTER — Encounter (INDEPENDENT_AMBULATORY_CARE_PROVIDER_SITE_OTHER): Payer: Medicare Other

## 2011-08-12 DIAGNOSIS — R0602 Shortness of breath: Secondary | ICD-10-CM

## 2011-08-12 DIAGNOSIS — I4891 Unspecified atrial fibrillation: Secondary | ICD-10-CM

## 2011-08-16 ENCOUNTER — Ambulatory Visit (INDEPENDENT_AMBULATORY_CARE_PROVIDER_SITE_OTHER): Payer: Medicare Other | Admitting: Internal Medicine

## 2011-08-16 DIAGNOSIS — R0602 Shortness of breath: Secondary | ICD-10-CM

## 2011-08-16 LAB — PULMONARY FUNCTION TEST

## 2011-08-16 NOTE — Progress Notes (Signed)
PFT done today. 

## 2011-08-20 ENCOUNTER — Encounter: Payer: Self-pay | Admitting: Cardiology

## 2011-08-24 ENCOUNTER — Ambulatory Visit (INDEPENDENT_AMBULATORY_CARE_PROVIDER_SITE_OTHER): Payer: Medicare Other | Admitting: Cardiology

## 2011-08-24 ENCOUNTER — Encounter: Payer: Self-pay | Admitting: Cardiology

## 2011-08-24 ENCOUNTER — Ambulatory Visit (INDEPENDENT_AMBULATORY_CARE_PROVIDER_SITE_OTHER): Payer: Medicare Other | Admitting: Pharmacist

## 2011-08-24 VITALS — BP 130/79 | HR 70 | Ht 73.0 in | Wt 189.0 lb

## 2011-08-24 DIAGNOSIS — D61818 Other pancytopenia: Secondary | ICD-10-CM

## 2011-08-24 DIAGNOSIS — I10 Essential (primary) hypertension: Secondary | ICD-10-CM

## 2011-08-24 DIAGNOSIS — I4891 Unspecified atrial fibrillation: Secondary | ICD-10-CM

## 2011-08-24 DIAGNOSIS — R0602 Shortness of breath: Secondary | ICD-10-CM

## 2011-08-24 DIAGNOSIS — N259 Disorder resulting from impaired renal tubular function, unspecified: Secondary | ICD-10-CM

## 2011-08-24 DIAGNOSIS — R0609 Other forms of dyspnea: Secondary | ICD-10-CM

## 2011-08-24 DIAGNOSIS — Z7901 Long term (current) use of anticoagulants: Secondary | ICD-10-CM

## 2011-08-24 NOTE — Assessment & Plan Note (Signed)
I think this is multifactorial. I do not think is related to his fibrillation. I don't think he has any active ischemia contributing. There is probably some diastolic dysfunction which needs to be treated very conservatively so as not to precipitate further renal insufficiency. We talked about salt and fluid restriction. I will send him for a pulmonary consult to consider any therapies that might improve his dyspnea based on his pulmonary function testing.

## 2011-08-24 NOTE — Assessment & Plan Note (Signed)
The blood pressure is at target. No change in medications is indicated. We will continue with therapeutic lifestyle changes (TLC).  

## 2011-08-24 NOTE — Patient Instructions (Signed)
The current medical regimen is effective;  continue present plan and medications.  You have been referred to pulmonary for the evaluation of shortness of breath.  Follow up with Dr Antoine Poche in 3 months

## 2011-08-24 NOTE — Progress Notes (Signed)
HPI He has a history of CAD, status post prior PCI to the OM in 1997 and stenting to the mid to distal RCA in 2001, HTN, hyperlipidemia, stage III CKD and prior TIA. Last LHC 9/06: LM ok, pLAD 50%, mLAD 60% then 60-70%, mRI 50%, mOM1 80-90% (small), pRCA and dRCA stents ok, PDA 50%, EF 65%. Thus, he had high-grade small vessel disease and diffuse nonobstructive large vessel disease. This was treated medically. Last Myoview 5/12: No ischemia, EF 64%.   Admitted 01/2011 with NSTEMI in the setting of diastolic heart failure. This was c/b AFib with RVR. He was started on Coumadin. With his CKD, age and recent low risk Myoview, medical therapy was pursued. Echocardiogram 02/17/11: Severe LVH, asymmetric hypertrophy, EF 50-55%, normal wall motion, high ventricular filling pressure, mild LAE, mild RVE, mildly reduced RVSF, mild RAE.   He has been evaluated by Dr. [name] for pancytopenia. Had BM biopsy with possible dx of myelodysplastic syndrome.  He saw Mr. Alben Spittle earlier this month with complaints of dyspnea. Studies included pulmonary function testing.  This demonstrated moderately reduced diffusing capacity and only minimal obstructive disease. I reviewed this with him today. Holter monitoring demonstrated atrial fibrillation. He did have 2 short runs of a wide complex tachycardia probably ventricular. He had no symptoms with this. Labs included a BNP which was elevated but much lower than previous. He has stable renal insufficiency with a creatinine of 3.0. He is anemic with a hemoglobin of 8.6 which is slowly drifting down. However, this is being managed with iron and followed by hematology.  Today he reports that he continues to get some good days and some bad days. He is fatigued more than he used to be. He gets short of breath walking a moderate distance on level ground. However, he denies any PND or orthopnea. He's not noticing palpitations and has no presyncope or syncope. He is not describing chest  discomfort.  Allergies  Allergen Reactions  . Oxycodone Hcl Other (See Comments)    Wanted to climb the wall  . Prednisone Other (See Comments)    Gain weight     Current Outpatient Prescriptions  Medication Sig Dispense Refill  . acetaminophen (TYLENOL) 325 MG tablet Take 650 mg by mouth every 6 (six) hours as needed.      Marland Kitchen amLODipine (NORVASC) 10 MG tablet Take 0.5 tablets (5 mg total) by mouth at bedtime.      . cloNIDine (CATAPRES) 0.1 MG tablet TAKE ONE TABLET BY MOUTH TWICE DAILY  60 tablet  1  . doxycycline (VIBRAMYCIN) 100 MG capsule Take 100 mg by mouth daily.      . furosemide (LASIX) 20 MG tablet Take 20 mg by mouth daily.      . isosorbide mononitrate (IMDUR) 30 MG 24 hr tablet Take 30 mg by mouth at bedtime.       . metoprolol (LOPRESSOR) 50 MG tablet Take 50 mg by mouth at bedtime.       . nitroGLYCERIN (NITROSTAT) 0.4 MG SL tablet Place 0.4 mg under the tongue every 5 (five) minutes as needed. Chest pain      . rosuvastatin (CRESTOR) 20 MG tablet Take 10 mg by mouth at bedtime.      . Tamsulosin HCl (FLOMAX) 0.4 MG CAPS Take 0.4 mg by mouth at bedtime.       Marland Kitchen warfarin (COUMADIN) 5 MG tablet TAKE ONE TABLET BY MOUTH EVERY DAY  30 tablet  3  . DISCONTD: isosorbide mononitrate (IMDUR)  30 MG 24 hr tablet Take 1 tablet (30 mg total) by mouth daily.  30 tablet  6    Past Medical History  Diagnosis Date  . CAD (coronary artery disease)     status post prior PCI to the obtuse marginal in 1997 and stenting to the mid to distal RCA in 2001; LHC 9/06:  LM ok, pLAD 50%, mLAD 60% then 60-70%, mRI 50%, mOM1 80-90% (small), pRCA and dRCA stents ok, PDA 50%, EF 65%;  Myoview 5/12: No ischemia, EF 64%.;  NSTEMI 11/12 tx medically   . Chronic kidney disease, stage III (moderate)   . TIA (transient ischemic attack)   . HTN (hypertension)   . Hyperlipidemia   . AF (atrial fibrillation) 02/16/11    Coumadin  . Arthritis     "in my left ankle"  . Chronic diastolic heart failure   .  Anemia of chronic disease   . Carotid stenosis     dopplers 03/2011: 0-39% bilateral    Past Surgical History  Procedure Date  . Nose surgery 1985    deviated septum  . Total knee arthroplasty 2009    left  . Joint replacement 2009    left knee  . Appendectomy 1930's  . Tonsillectomy and adenoidectomy 1930's  . Coronary angioplasty with stent placement     3 procedures; 3 stents total  . Cataract extraction w/ intraocular lens  implant, bilateral   . Surgery scrotal / testicular 1971    "testicle crushed; removed"    ROS:  As stated in the HPI and negative for all other systems.  PHYSICAL EXAM BP 130/79  Pulse 70  Ht 6\' 1"  (1.854 m)  Wt 189 lb (85.73 kg)  BMI 24.94 kg/m2 GENERAL:  Well appearing HEENT:  Pupils equal round and reactive, fundi not visualized, oral mucosa unremarkable NECK:  No jugular venous distention, waveform within normal limits, carotid upstroke brisk and symmetric, no bruits, no thyromegaly LYMPHATICS:  No cervical, inguinal adenopathy LUNGS:  Clear to auscultation bilaterally BACK:  No CVA tenderness CHEST:  Unremarkable HEART:  PMI not displaced or sustained,S1 and S2 within normal limits, no S3,  no clicks, no rubs, no murmurs, irregular ABD:  Flat, positive bowel sounds normal in frequency in pitch, no bruits, no rebound, no guarding, no midline pulsatile mass, no hepatomegaly, no splenomegaly EXT:  2 plus pulses throughout, no edema, no cyanosis no clubbing SKIN:  No rashes no nodules NEURO:  Cranial nerves II through XII grossly intact, motor grossly intact throughout PSYCH:  Cognitively intact, oriented to person place and time  ASSESSMENT AND PLAN

## 2011-08-24 NOTE — Assessment & Plan Note (Addendum)
[name] has a CHA2DS2 - VASc score of 5 with a risk of stroke of 6.7%  and a HAS - BLED score of 3.with one validation study suggesting a risk of 3.72 bleeds per 100 patient years.  The patient  tolerates this rhythm and rate control and anticoagulation. We will continue with the meds as listed.

## 2011-08-24 NOTE — Assessment & Plan Note (Signed)
This is followed and stable. He will continue with meds as listed.

## 2011-08-24 NOTE — Assessment & Plan Note (Signed)
He has a myeloproliferative disorder and is being followed and managed by hematology.

## 2011-08-25 ENCOUNTER — Ambulatory Visit (INDEPENDENT_AMBULATORY_CARE_PROVIDER_SITE_OTHER): Payer: Medicare Other | Admitting: Pulmonary Disease

## 2011-08-25 ENCOUNTER — Encounter: Payer: Self-pay | Admitting: Pulmonary Disease

## 2011-08-25 VITALS — BP 130/72 | HR 72 | Temp 97.0°F | Ht 73.0 in | Wt 190.2 lb

## 2011-08-25 DIAGNOSIS — R0609 Other forms of dyspnea: Secondary | ICD-10-CM

## 2011-08-25 DIAGNOSIS — R0989 Other specified symptoms and signs involving the circulatory and respiratory systems: Secondary | ICD-10-CM

## 2011-08-25 NOTE — Assessment & Plan Note (Addendum)
Low DLCO 56%, No airway obstruction - FEv1 is nml range , low ratio is normal in older people Obtain HRCT chest to r/o pulm fibrosis Dyspnea may be multifactorial  - combination of scarring, anemia , renal insuff & diastolic heart failure Regardless, deconditioning probably also an issue - ok to start on cardiac rehab Do not think bronchodilators would be of benefit here

## 2011-08-25 NOTE — Progress Notes (Signed)
Subjective:    Patient ID: [name], male    DOB: [birth year], 78 y.o.   MRN: [id number]  HPI 78 y.o  remote ex-smoker, retired [employer] account executive , referred for evaluation of dyspnea and abnormal PFTs. He was admitted 01/2011 with NSTEMI in the setting of diastolic heart failure. This was c/b AFib with RVR requiring Coumadin. With his CKD, age and  low risk Myoview in 5/12, medical therapy was pursued. Echocardiogram 02/17/11 showed Severe LVH, asymmetric hypertrophy, EF 50-55%, normal wall motion, high ventricular filling pressure, mildly reduced RVSF, mild RAE. He complains of dyspnea since then and difficulty in taking a deep breath. He denies wheezing, chest pain, orthopnea or paroxysmal nocturnal dyspnea. He has swelling in his left lower extremity since his knee replacement and uses compression stockings.  PFTs showed  moderately reduced diffusing capacity at 56% and normal FEV1. Ratio was reduced but this is normal for his age . He did not desaturate on exertion. His wife is concerned that rehabilitation has not been started. Review of his x-rays shows pulmonary edema in 11/12 in stable bibasal scarring on 08/04/2011. He quit smoking in 1980 and smoked about 30-pack-years. 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 He has a history of CAD, status post  stent to RCA in 2001, HTN, hyperlipidemia, stage III CKD and prior TIA. Last [facility] 9/06 showed high-grade small vessel disease and diffuse nonobstructive large vessel disease.    He has been evaluated by Dr. [name] for pancytopenia. Had BM biopsy with possible dx of myelodysplastic syndrome.  Holter monitoring demonstrated atrial fibrillation. He did have 2 short runs of a wide complex tachycardia probably ventricular. He had no symptoms with this.  Labs  - BNP 601- lower than previous. He has stable renal insufficiency with a creatinine of 3.0. He is anemic with a hemoglobin of 8.6  - on iron and followed by hematology.  Past  Medical History  Diagnosis Date  . CAD (coronary artery disease)     status post prior PCI to the obtuse marginal in 1997 and stenting to the mid to distal RCA in 2001; LHC 9/06:  LM ok, pLAD 50%, mLAD 60% then 60-70%, mRI 50%, mOM1 80-90% (small), pRCA and dRCA stents ok, PDA 50%, EF 65%;  Myoview 5/12: No ischemia, EF 64%.;  NSTEMI 11/12 tx medically   . Chronic kidney disease, stage III (moderate)   . TIA (transient ischemic attack)   . HTN (hypertension)   . Hyperlipidemia   . AF (atrial fibrillation) 02/16/11    Coumadin  . Arthritis     "in my left ankle"  . Chronic diastolic heart failure   . Anemia of chronic disease   . Carotid stenosis     dopplers 03/2011: 0-39% bilateral    Past Surgical History  Procedure Date  . Nose surgery 1985    deviated septum  . Total knee arthroplasty 2009    left  . Joint replacement 2009    left knee  . Appendectomy 1930's  . Tonsillectomy and adenoidectomy 1930's  . Coronary angioplasty with stent placement     3 procedures; 3 stents total  . Cataract extraction w/ intraocular lens  implant, bilateral   . Surgery scrotal / testicular 1971    "testicle crushed; removed"    Allergies  Allergen Reactions  . Oxycodone Hcl Other (See Comments)    Wanted to climb the wall  . Prednisone Other (See Comments)    Gain weight  Review of Systems  Constitutional: Positive for unexpected weight change. Negative for fever.  HENT: Positive for sneezing and postnasal drip. Negative for ear pain, nosebleeds, congestion, sore throat, rhinorrhea, trouble swallowing, dental problem and sinus pressure.   Eyes: Negative for redness and itching.  Respiratory: Positive for cough, chest tightness, shortness of breath and wheezing.   Cardiovascular: Positive for palpitations. Negative for leg swelling.  Gastrointestinal: Negative for nausea and vomiting.  Genitourinary: Negative for dysuria.  Musculoskeletal: Negative for joint swelling.  Skin:  Negative for rash.  Neurological: Positive for headaches.  Hematological: Bruises/bleeds easily.  Psychiatric/Behavioral: Negative for dysphoric mood. The patient is not nervous/anxious.        Objective:   Physical Exam  Gen. Pleasant, well-nourished, tall,in no distress, normal affect, appears younger than age ENT - no lesions, no post nasal drip Neck: No JVD, no thyromegaly, no carotid bruits Lungs: no use of accessory muscles, no dullness to percussion, clear without rhonchi , Lt basal crackles, rt clear Cardiovascular: Rhythm regular, heart sounds  normal, no murmurs or gallops, no peripheral edema Abdomen: soft and non-tender, no hepatosplenomegaly, BS normal. Musculoskeletal: No deformities, no cyanosis or clubbing Neuro:  alert, non focal       Assessment & Plan:

## 2011-08-25 NOTE — Patient Instructions (Signed)
You do not have COPD Check oxygen levels with walking Ct scan to check for scar tissue (fibrosis ) in the lungs OK to start rehab program

## 2011-08-26 ENCOUNTER — Ambulatory Visit (INDEPENDENT_AMBULATORY_CARE_PROVIDER_SITE_OTHER)
Admission: RE | Admit: 2011-08-26 | Discharge: 2011-08-26 | Disposition: A | Discharge status: Home / Self Care | Payer: Medicare Other | Source: Ambulatory Visit | Attending: Pulmonary Disease | Admitting: Pulmonary Disease

## 2011-08-26 DIAGNOSIS — R0609 Other forms of dyspnea: Secondary | ICD-10-CM

## 2011-08-26 DIAGNOSIS — R0989 Other specified symptoms and signs involving the circulatory and respiratory systems: Secondary | ICD-10-CM

## 2011-09-01 ENCOUNTER — Other Ambulatory Visit (HOSPITAL_COMMUNITY): Payer: Self-pay | Admitting: *Deleted

## 2011-09-02 ENCOUNTER — Encounter (HOSPITAL_COMMUNITY)
Admission: RE | Admit: 2011-09-02 | Discharge: 2011-09-02 | Disposition: A | Discharge status: Home / Self Care | Payer: Medicare Other | Source: Ambulatory Visit | Attending: Nephrology | Admitting: Nephrology

## 2011-09-02 DIAGNOSIS — D638 Anemia in other chronic diseases classified elsewhere: Secondary | ICD-10-CM | POA: Insufficient documentation

## 2011-09-02 DIAGNOSIS — N184 Chronic kidney disease, stage 4 (severe): Secondary | ICD-10-CM | POA: Insufficient documentation

## 2011-09-02 LAB — POCT HEMOGLOBIN-HEMACUE: Hemoglobin: 7.9 g/dL — ABNORMAL LOW (ref 13.0–17.0)

## 2011-09-02 MED ORDER — DARBEPOETIN ALFA-POLYSORBATE 100 MCG/0.5ML IJ SOLN
100.0000 ug | INTRAMUSCULAR | Status: DC
  Administered 2011-09-02: 100 ug via SUBCUTANEOUS

## 2011-09-02 MED ORDER — DARBEPOETIN ALFA-POLYSORBATE 100 MCG/0.5ML IJ SOLN
INTRAMUSCULAR | Status: AC
  Filled 2011-09-02: qty 0.5

## 2011-09-09 ENCOUNTER — Encounter (HOSPITAL_COMMUNITY)
Admission: RE | Admit: 2011-09-09 | Discharge: 2011-09-09 | Disposition: A | Discharge status: Home / Self Care | Payer: Medicare Other | Source: Ambulatory Visit | Attending: Nephrology | Admitting: Nephrology

## 2011-09-09 MED ORDER — DARBEPOETIN ALFA-POLYSORBATE 150 MCG/0.3ML IJ SOLN
INTRAMUSCULAR | Status: AC
  Administered 2011-09-09: 150 ug via SUBCUTANEOUS
  Filled 2011-09-09: qty 0.3

## 2011-09-09 MED ORDER — DARBEPOETIN ALFA-POLYSORBATE 100 MCG/0.5ML IJ SOLN: 100.0000 ug | INTRAMUSCULAR | Status: DC

## 2011-09-10 ENCOUNTER — Other Ambulatory Visit (HOSPITAL_COMMUNITY): Payer: Self-pay | Admitting: *Deleted

## 2011-09-10 LAB — IRON AND TIBC
Iron: 139 ug/dL — ABNORMAL HIGH (ref 42–135)
TIBC: 249 ug/dL (ref 215–435)

## 2011-09-16 ENCOUNTER — Encounter (HOSPITAL_COMMUNITY)
Admission: RE | Admit: 2011-09-16 | Discharge: 2011-09-16 | Disposition: A | Discharge status: Home / Self Care | Payer: Medicare Other | Source: Ambulatory Visit | Attending: Nephrology | Admitting: Nephrology

## 2011-09-16 LAB — RENAL FUNCTION PANEL
BUN: 56 mg/dL — ABNORMAL HIGH (ref 6–23)
CO2: 21 mEq/L (ref 19–32)
Chloride: 105 mEq/L (ref 96–112)
Creatinine, Ser: 2.84 mg/dL — ABNORMAL HIGH (ref 0.50–1.35)
GFR calc non Af Amer: 18 mL/min — ABNORMAL LOW (ref >90)
Glucose, Bld: 126 mg/dL — ABNORMAL HIGH (ref 70–99)

## 2011-09-16 MED ORDER — DARBEPOETIN ALFA-POLYSORBATE 150 MCG/0.3ML IJ SOLN
150.0000 ug | INTRAMUSCULAR | Status: DC
  Administered 2011-09-16: 150 ug via SUBCUTANEOUS
  Filled 2011-09-16: qty 0.3

## 2011-09-21 ENCOUNTER — Ambulatory Visit (INDEPENDENT_AMBULATORY_CARE_PROVIDER_SITE_OTHER): Payer: Medicare Other | Admitting: *Deleted

## 2011-09-21 DIAGNOSIS — I4891 Unspecified atrial fibrillation: Secondary | ICD-10-CM

## 2011-09-21 DIAGNOSIS — Z7901 Long term (current) use of anticoagulants: Secondary | ICD-10-CM

## 2011-09-21 LAB — POCT INR: INR: 3.6

## 2011-09-22 ENCOUNTER — Other Ambulatory Visit (HOSPITAL_COMMUNITY): Payer: Self-pay | Admitting: *Deleted

## 2011-09-23 ENCOUNTER — Encounter (HOSPITAL_COMMUNITY)
Admission: RE | Admit: 2011-09-23 | Discharge: 2011-09-23 | Disposition: A | Discharge status: Home / Self Care | Payer: Medicare Other | Source: Ambulatory Visit | Attending: Nephrology | Admitting: Nephrology

## 2011-09-23 LAB — RENAL FUNCTION PANEL
CO2: 20 mEq/L (ref 19–32)
Calcium: 10.8 mg/dL — ABNORMAL HIGH (ref 8.4–10.5)
Chloride: 106 mEq/L (ref 96–112)
GFR calc Af Amer: 19 mL/min — ABNORMAL LOW (ref >90)
GFR calc non Af Amer: 16 mL/min — ABNORMAL LOW (ref >90)
Potassium: 4.6 mEq/L (ref 3.5–5.1)
Sodium: 137 mEq/L (ref 135–145)

## 2011-09-23 MED ORDER — DARBEPOETIN ALFA-POLYSORBATE 150 MCG/0.3ML IJ SOLN
150.0000 ug | INTRAMUSCULAR | Status: DC
  Administered 2011-09-23: 150 ug via SUBCUTANEOUS

## 2011-09-23 MED ORDER — DARBEPOETIN ALFA-POLYSORBATE 150 MCG/0.3ML IJ SOLN
INTRAMUSCULAR | Status: AC
  Filled 2011-09-23: qty 0.3

## 2011-09-28 ENCOUNTER — Telehealth: Payer: Self-pay | Admitting: Internal Medicine

## 2011-09-28 ENCOUNTER — Other Ambulatory Visit (HOSPITAL_BASED_OUTPATIENT_CLINIC_OR_DEPARTMENT_OTHER): Payer: Medicare Other | Admitting: Lab

## 2011-09-28 ENCOUNTER — Ambulatory Visit (HOSPITAL_BASED_OUTPATIENT_CLINIC_OR_DEPARTMENT_OTHER): Payer: Medicare Other | Admitting: Internal Medicine

## 2011-09-28 VITALS — BP 110/61 | HR 59 | Temp 96.9°F | Ht 73.0 in | Wt 180.7 lb

## 2011-09-28 DIAGNOSIS — D539 Nutritional anemia, unspecified: Secondary | ICD-10-CM

## 2011-09-28 DIAGNOSIS — D469 Myelodysplastic syndrome, unspecified: Secondary | ICD-10-CM

## 2011-09-28 LAB — COMPREHENSIVE METABOLIC PANEL
Albumin: 3.6 g/dL (ref 3.5–5.2)
BUN: 53 mg/dL — ABNORMAL HIGH (ref 6–23)
Calcium: 9.2 mg/dL (ref 8.4–10.5)
Chloride: 109 mEq/L (ref 96–112)
Creatinine, Ser: 3.29 mg/dL — ABNORMAL HIGH (ref 0.50–1.35)
Glucose, Bld: 123 mg/dL — ABNORMAL HIGH (ref 70–99)
Potassium: 4.6 mEq/L (ref 3.5–5.3)

## 2011-09-28 LAB — CBC WITH DIFFERENTIAL/PLATELET
Basophils Absolute: 0 10*3/uL (ref 0.0–0.1)
EOS%: 2.3 % (ref 0.0–7.0)
Eosinophils Absolute: 0.1 10*3/uL (ref 0.0–0.5)
HGB: 8.7 g/dL — ABNORMAL LOW (ref 13.0–17.1)
MONO#: 0.6 10*3/uL (ref 0.1–0.9)
NEUT#: 2.1 10*3/uL (ref 1.5–6.5)
RDW: 18.4 % — ABNORMAL HIGH (ref 11.0–14.6)
WBC: 3.6 10*3/uL — ABNORMAL LOW (ref 4.0–10.3)
lymph#: 0.8 10*3/uL — ABNORMAL LOW (ref 0.9–3.3)

## 2011-09-28 NOTE — Progress Notes (Signed)
[facility] Telephone:([phone]) [phone]   [phone]) [phone]  OFFICE PROGRESS NOTE  [name],Tracy A, MD [address] [place] [address]  DIAGNOSIS: Myelodysplastic syndrome with t(1;3).  PRIOR THERAPY: None  CURRENT THERAPY: Weekly Procrit under the care of Dr. Caryn [name]  INTERVAL HISTORY: Tracy [name] 78 y.o. male returns to the clinic today for three-month followup visit accompanied by his wife. The patient is to have fatigue and weakness. He denied having any dizzy spells. He has no chest pain or shortness breath. He lost around 10 pounds since his last visit secondary to poor appetite. He has repeat CBC performed earlier today and he is here for evaluation and discussion of his lab results. His cytogenetics that showed the presence of 2) salines. The first Was chromosomally normal (10% of cells). The second saline was cytologically abnormal with a 1;3 translocation which is associated with MDS and a male to a lesser extent.  MEDICAL HISTORY: Past Medical History  Diagnosis Date  . CAD (coronary artery disease)     status post prior PCI to the obtuse marginal in 1997 and stenting to the mid to distal RCA in 2001; LHC 9/06:  LM ok, pLAD 50%, mLAD 60% then 60-70%, mRI 50%, mOM1 80-90% (small), pRCA and dRCA stents ok, PDA 50%, EF 65%;  Myoview 5/12: No ischemia, EF 64%.;  NSTEMI 11/12 tx medically   . Chronic kidney disease, stage III (moderate)   . TIA (transient ischemic attack)   . HTN (hypertension)   . Hyperlipidemia   . AF (atrial fibrillation) 02/16/11    Coumadin  . Arthritis     "in my left ankle"  . Chronic diastolic heart failure   . Anemia of chronic disease   . Carotid stenosis     dopplers 03/2011: 0-39% bilateral    ALLERGIES:  is allergic to oxycodone hcl and prednisone.  MEDICATIONS:  Current Outpatient Prescriptions  Medication Sig Dispense Refill  . acetaminophen (TYLENOL) 325 MG tablet Take 650 mg by mouth every 6 (six) hours as needed.        Marland Kitchen amLODipine (NORVASC) 10 MG tablet Take 10 mg by mouth at bedtime.       . cloNIDine (CATAPRES) 0.1 MG tablet       . isosorbide mononitrate (IMDUR) 30 MG 24 hr tablet Take 30 mg by mouth at bedtime.       . metoprolol (LOPRESSOR) 50 MG tablet Take 50 mg by mouth at bedtime.       . nitroGLYCERIN (NITROSTAT) 0.4 MG SL tablet Place 0.4 mg under the tongue every 5 (five) minutes as needed. Chest pain        . rosuvastatin (CRESTOR) 20 MG tablet Take 10 mg by mouth at bedtime.      . Tamsulosin HCl (FLOMAX) 0.4 MG CAPS Take 0.4 mg by mouth at bedtime.       Marland Kitchen warfarin (COUMADIN) 5 MG tablet       . DISCONTD: isosorbide mononitrate (IMDUR) 30 MG 24 hr tablet Take 1 tablet (30 mg total) by mouth daily.  30 tablet  6    SURGICAL HISTORY:  Past Surgical History  Procedure Date  . Nose surgery 1985    deviated septum  . Total knee arthroplasty 2009    left  . Joint replacement 2009    left knee  . Appendectomy 1930's  . Tonsillectomy and adenoidectomy 1930's  . Coronary angioplasty with stent placement     3 procedures;  3 stents total  . Cataract extraction w/ intraocular lens  implant, bilateral   . Surgery scrotal / testicular 1971    "testicle crushed; removed"    REVIEW OF SYSTEMS:  A comprehensive review of systems was negative except for: Constitutional: positive for anorexia, fatigue and weight loss   PHYSICAL EXAMINATION: General appearance: alert, cooperative and no distress Neck: no adenopathy Lymph nodes: Cervical, supraclavicular, and axillary nodes normal. Resp: clear to auscultation bilaterally Cardio: regular rate and rhythm, S1, S2 normal, no murmur, click, rub or gallop GI: soft, non-tender; bowel sounds normal; no masses,  no organomegaly Extremities: extremities normal, atraumatic, no cyanosis or edema Neurologic: Alert and oriented X 3, normal strength and tone. Normal symmetric reflexes. Normal coordination and gait  ECOG PERFORMANCE STATUS: 1 - Symptomatic  but completely ambulatory  Blood pressure 110/61, pulse 59, temperature 96.9 F (36.1 C), temperature source Oral, height 6\' 1"  (1.854 m), weight 180 lb 11.2 oz (81.965 kg).  LABORATORY DATA: Lab Results  Component Value Date   WBC 3.6* 09/28/2011   HGB 8.7* 09/28/2011   HCT 27.6* 09/28/2011   MCV 101.9* 09/28/2011   PLT 107* 09/28/2011      Chemistry      Component Value Date/Time   NA 137 09/23/2011 1522   K 4.6 09/23/2011 1522   CL 106 09/23/2011 1522   CO2 20 09/23/2011 1522   BUN 61* 09/23/2011 1522   CREATININE 3.07* 09/23/2011 1522      Component Value Date/Time   CALCIUM 10.8* 09/23/2011 1522   CALCIUM 9.8 08/04/2011 1241   ALKPHOS 79 04/07/2011 1355   AST 16 04/07/2011 1355   ALT 18 04/07/2011 1355   BILITOT 0.7 04/07/2011 1355       RADIOGRAPHIC STUDIES: No results found.  ASSESSMENT: This is a very pleasant 78 years old white male with MDS.   PLAN: I have a lengthy discussion with the patient and his wife today about his condition and treatment options. I gave the patient the option of observation versus consideration of systemic therapy with Vidaza. He is not interested in systemic therapy at this point and referred wait and watching.  For the poor appetite I gave the patient the option of treatment with Megace but he also declined this option secondary to risk of thrombosis. I would see the patient back for followup visit in 3 months with repeat CBC, comprehensive metabolic panel, LDH and anemia panel. He was advised to call me immediately she has any concerning symptoms in the interval.  All questions were answered. The patient knows to call the clinic with any problems, questions or concerns. We can certainly see the patient much sooner if necessary.

## 2011-09-28 NOTE — Telephone Encounter (Signed)
appts made and printed for pt aom °

## 2011-10-01 ENCOUNTER — Encounter (HOSPITAL_COMMUNITY)
Admission: RE | Admit: 2011-10-01 | Discharge: 2011-10-01 | Disposition: A | Discharge status: Home / Self Care | Payer: Medicare Other | Source: Ambulatory Visit | Attending: Nephrology | Admitting: Nephrology

## 2011-10-01 DIAGNOSIS — D649 Anemia, unspecified: Secondary | ICD-10-CM | POA: Insufficient documentation

## 2011-10-01 MED ORDER — DARBEPOETIN ALFA-POLYSORBATE 150 MCG/0.3ML IJ SOLN
150.0000 ug | INTRAMUSCULAR | Status: DC
  Administered 2011-10-01: 150 ug via SUBCUTANEOUS

## 2011-10-01 MED ORDER — DARBEPOETIN ALFA-POLYSORBATE 150 MCG/0.3ML IJ SOLN
INTRAMUSCULAR | Status: AC
  Filled 2011-10-01: qty 0.3

## 2011-10-04 ENCOUNTER — Other Ambulatory Visit: Payer: Self-pay | Admitting: Cardiology

## 2011-10-04 ENCOUNTER — Other Ambulatory Visit: Payer: Self-pay | Admitting: Gastroenterology

## 2011-10-04 DIAGNOSIS — D649 Anemia, unspecified: Secondary | ICD-10-CM

## 2011-10-06 ENCOUNTER — Ambulatory Visit
Admission: RE | Admit: 2011-10-06 | Discharge: 2011-10-06 | Disposition: A | Discharge status: Home / Self Care | Payer: Medicare Other | Source: Ambulatory Visit | Attending: Gastroenterology | Admitting: Gastroenterology

## 2011-10-06 DIAGNOSIS — D649 Anemia, unspecified: Secondary | ICD-10-CM

## 2011-10-07 ENCOUNTER — Other Ambulatory Visit: Payer: Self-pay | Admitting: Gastroenterology

## 2011-10-07 DIAGNOSIS — R109 Unspecified abdominal pain: Secondary | ICD-10-CM

## 2011-10-08 ENCOUNTER — Encounter (HOSPITAL_COMMUNITY)
Admission: RE | Admit: 2011-10-08 | Discharge: 2011-10-08 | Disposition: A | Discharge status: Home / Self Care | Payer: Medicare Other | Source: Ambulatory Visit | Attending: Nephrology | Admitting: Nephrology

## 2011-10-08 MED ORDER — DARBEPOETIN ALFA-POLYSORBATE 150 MCG/0.3ML IJ SOLN
150.0000 ug | INTRAMUSCULAR | Status: DC
  Administered 2011-10-08: 150 ug via SUBCUTANEOUS
  Filled 2011-10-08: qty 0.3

## 2011-10-09 LAB — IRON AND TIBC
Iron: 113 ug/dL (ref 42–135)
Saturation Ratios: 46 % (ref 20–55)
TIBC: 244 ug/dL (ref 215–435)
UIBC: 131 ug/dL (ref 125–400)

## 2011-10-09 LAB — FERRITIN: Ferritin: 122 ng/mL (ref 22–322)

## 2011-10-12 ENCOUNTER — Ambulatory Visit (INDEPENDENT_AMBULATORY_CARE_PROVIDER_SITE_OTHER): Payer: Medicare Other | Admitting: Pulmonary Disease

## 2011-10-12 ENCOUNTER — Encounter: Payer: Self-pay | Admitting: Pulmonary Disease

## 2011-10-12 VITALS — BP 106/58 | HR 81 | Temp 97.5°F | Ht 73.0 in | Wt 183.0 lb

## 2011-10-12 DIAGNOSIS — J8410 Pulmonary fibrosis, unspecified: Secondary | ICD-10-CM

## 2011-10-12 DIAGNOSIS — R911 Solitary pulmonary nodule: Secondary | ICD-10-CM

## 2011-10-12 NOTE — Assessment & Plan Note (Addendum)
ct abdomen 7/13 reviewed with radiologist - this nodule looks smaller than 5/13 & is likely inflammatory His  wt loss is concerning & would require FU CT chest in 6 m

## 2011-10-12 NOTE — Patient Instructions (Addendum)
You have scar tissue at the bottom of your lungs- fibrosis You have nodules in your lung - largest is 2 cm - may need more scans Wait on Ct abdomen results Pulm rehab referral - call us if you have not heard back in 1 month

## 2011-10-12 NOTE — Progress Notes (Signed)
  Subjective:    Patient ID: Tracy [name], male    DOB: 08/25/1919, 78 y.o.   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  He was admitted 01/2011 with NSTEMI in the setting of diastolic heart failure. This was c/b AFib with RVR requiring Coumadin. With his CKD, age and low risk Myoview in 5/12, medical therapy was pursued. Echocardiogram 02/17/11 showed Severe LVH, asymmetric hypertrophy, EF 50-55%, normal wall motion, high ventricular filling pressure, mildly reduced RVSF, mild RAE.  He complains of dyspnea since then and difficulty in taking a deep breath. He denies wheezing, chest pain, orthopnea or paroxysmal nocturnal dyspnea. He has swelling in his left lower extremity since his knee replacement and uses compression stockings.  PFTs showed moderately reduced diffusing capacity at 56% and normal FEV1. Ratio was reduced but this is normal for his age . He did not desaturate on exertion. His wife is concerned that rehabilitation has not been started. Review of his x-rays shows pulmonary edema in 11/12 in stable bibasal scarring on 08/04/2011. He quit smoking in 1980 and smoked about 30-pack-years. 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  He has a history of CAD, status post stent to RCA in 2001, HTN, hyperlipidemia, stage III CKD and prior TIA. Last [facility] 9/06 showed high-grade small vessel disease and diffuse nonobstructive large vessel disease.  He has been evaluated by Dr. [name] for pancytopenia. Had BM biopsy with possible dx of myelodysplastic syndrome. Holter monitoring demonstrated atrial fibrillation. He did have 2 short runs of a wide complex tachycardia probably ventricular. He had no symptoms with this.  Labs - BNP 601- lower than previous. He has stable renal insufficiency with a creatinine of 3.0. He is anemic with a hemoglobin of 8.6 - on iron and followed by  hematology  10/12/2011 Continues to feel poorly c/o increase SOB w/ activity and at rest, occasional cough, hard for him to take a deep breath. rehab was never set up for pt HRCT showed dependent intralobular septal thickening, along with panlobar and para septal emphysema, likely a combination of chronic emphysema and mild cardiogenic edema.  There are several bilateral subpleural nodules. The largest is in the posterior right lung base and measures 2 cm. Favored to be  round atelectasis / scarring rather than a neoplastic process.  There are several enlarged mediastinal lymph nodes, one of which is coarsely calcified which suggests old granulomatous disease  US abdomen - showed dilated cbd Ct abdomen( ( magod) non diagnostic 20 lb wt loss reported Bun/ cr 53/3.3 - slight worse   Review of Systems Patient denies significant cough, hemoptysis,  chest pain, palpitations, pedal edema, orthopnea, paroxysmal nocturnal dyspnea, lightheadedness, nausea, vomiting, abdominal or  leg pains      Objective:   Physical Exam  Gen. Pleasant, well-nourished, in no distress ENT - no lesions, no post nasal drip Neck: No JVD, no thyromegaly, no carotid bruits Lungs: no use of accessory muscles, no dullness to percussion, clear without rales or rhonchi  Cardiovascular: Rhythm regular, heart sounds  normal, no murmurs or gallops, no peripheral edema Musculoskeletal: No deformities, no cyanosis or clubbing        Assessment & Plan:

## 2011-10-12 NOTE — Assessment & Plan Note (Addendum)
You have scar tissue at the bottom of your lungs- fibrosis At his age, I doubt I will pursue further wu, the patternis not typical of UIP & may simply reflect scarring, no overt heart failure is obvious Pulm rehab referral - call us if you have not heard back in 1 month

## 2011-10-13 ENCOUNTER — Ambulatory Visit
Admission: RE | Admit: 2011-10-13 | Discharge: 2011-10-13 | Disposition: A | Discharge status: Home / Self Care | Payer: Medicare Other | Source: Ambulatory Visit | Attending: Gastroenterology | Admitting: Gastroenterology

## 2011-10-13 DIAGNOSIS — R109 Unspecified abdominal pain: Secondary | ICD-10-CM

## 2011-10-14 ENCOUNTER — Telehealth: Payer: Self-pay | Admitting: Pulmonary Disease

## 2011-10-14 ENCOUNTER — Encounter (HOSPITAL_COMMUNITY)
Admission: RE | Admit: 2011-10-14 | Discharge: 2011-10-14 | Disposition: A | Discharge status: Home / Self Care | Payer: Medicare Other | Source: Ambulatory Visit | Attending: Nephrology | Admitting: Nephrology

## 2011-10-14 DIAGNOSIS — R911 Solitary pulmonary nodule: Secondary | ICD-10-CM

## 2011-10-14 MED ORDER — DARBEPOETIN ALFA-POLYSORBATE 150 MCG/0.3ML IJ SOLN
INTRAMUSCULAR | Status: AC
  Filled 2011-10-14: qty 0.3

## 2011-10-14 MED ORDER — DARBEPOETIN ALFA-POLYSORBATE 150 MCG/0.3ML IJ SOLN
150.0000 ug | INTRAMUSCULAR | Status: DC
  Administered 2011-10-14: 150 ug via SUBCUTANEOUS

## 2011-10-14 NOTE — Telephone Encounter (Signed)
No contrast in 28m

## 2011-10-14 NOTE — Telephone Encounter (Signed)
I spoke with pt and is aware of the results. He voiced his understanding and had no questions. Pt would like to go ahead and have this set up for him. Please advise if ct needs w/ or w/o contrast Dr. Vassie Loll thanks

## 2011-10-14 NOTE — Telephone Encounter (Signed)
I have reviewed his CT abdomen with radiologist. The 2 cm nodule that was seen on hislast CT scna is smaller now - I do not think this is cancer - would advise FU CT scan in 6months

## 2011-10-19 ENCOUNTER — Other Ambulatory Visit (HOSPITAL_COMMUNITY): Payer: Self-pay | Admitting: *Deleted

## 2011-10-21 ENCOUNTER — Encounter: Payer: Self-pay | Admitting: Dietician

## 2011-10-21 ENCOUNTER — Encounter (HOSPITAL_COMMUNITY)
Admission: RE | Admit: 2011-10-21 | Discharge: 2011-10-21 | Disposition: A | Discharge status: Home / Self Care | Payer: Medicare Other | Source: Ambulatory Visit | Attending: Nephrology | Admitting: Nephrology

## 2011-10-21 LAB — POCT HEMOGLOBIN-HEMACUE: Hemoglobin: 9.4 g/dL — ABNORMAL LOW (ref 13.0–17.0)

## 2011-10-21 MED ORDER — DARBEPOETIN ALFA-POLYSORBATE 150 MCG/0.3ML IJ SOLN
150.0000 ug | INTRAMUSCULAR | Status: DC
  Administered 2011-10-21: 150 ug via SUBCUTANEOUS
  Filled 2011-10-21: qty 0.3

## 2011-10-21 NOTE — Progress Notes (Signed)
Brief Out-patient Oncology Nutrition Note  Reason: Patient Screened Positive For Nutrition Risk For Unintentional Weight Loss and Decreased Appetite.   Tracy [name] is a 78 year old patient of Dr. Eugenia Mcalpine, diagnosed with MDS. Contacted patient via telephone for positive nutrition risk. He reported his appetite has been poor and food just does not taste good anymore. He reported he eats meals despite lack of taste. He stated he has lost 22 lb since thanksgiving. He reported when he doe snot feel hungry he drinks Boost or a protein shake.   Wt Readings from Last 10 Encounters:  10/12/11 183 lb (83.008 kg)  09/28/11 180 lb 11.2 oz (81.965 kg)  08/25/11 190 lb 3.2 oz (86.274 kg)  08/24/11 189 lb (85.73 kg)  08/04/11 187 lb (84.823 kg)  06/30/11 191 lb (86.637 kg)  06/10/11 195 lb (88.451 kg)  06/09/11 195 lb 12.8 oz (88.814 kg)  06/03/11 195 lb (88.451 kg)  05/19/11 195 lb 14.4 oz (88.86 kg)  *Unintentional 12 lb weight loss over 5 months, 6.1% from baseline.   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 I have encouraged him to continue to drink the Boost or protein shake when unable to eat meals.  The patient was without any further nutrition related questions. Provided RD contact information and instructed patient to contact RD for future questions.   RD available for nutrition needs.   Iven Finn, MS, RD, LDN [phone]

## 2011-10-27 ENCOUNTER — Ambulatory Visit (INDEPENDENT_AMBULATORY_CARE_PROVIDER_SITE_OTHER): Payer: Medicare Other | Admitting: *Deleted

## 2011-10-27 DIAGNOSIS — Z7901 Long term (current) use of anticoagulants: Secondary | ICD-10-CM

## 2011-10-27 DIAGNOSIS — I4891 Unspecified atrial fibrillation: Secondary | ICD-10-CM

## 2011-10-27 LAB — POCT INR: INR: 3.6

## 2011-10-28 ENCOUNTER — Encounter (HOSPITAL_COMMUNITY)
Admission: RE | Admit: 2011-10-28 | Discharge: 2011-10-28 | Disposition: A | Discharge status: Home / Self Care | Payer: Medicare Other | Source: Ambulatory Visit | Attending: Nephrology | Admitting: Nephrology

## 2011-10-28 DIAGNOSIS — D649 Anemia, unspecified: Secondary | ICD-10-CM | POA: Insufficient documentation

## 2011-10-28 LAB — CBC
MCHC: 30.7 g/dL (ref 30.0–36.0)
RDW: 16.3 % — ABNORMAL HIGH (ref 11.5–15.5)
WBC: 3.5 10*3/uL — ABNORMAL LOW (ref 4.0–10.5)

## 2011-10-28 LAB — POCT HEMOGLOBIN-HEMACUE: Hemoglobin: 12.4 g/dL — ABNORMAL LOW (ref 13.0–17.0)

## 2011-10-28 MED ORDER — DARBEPOETIN ALFA-POLYSORBATE 150 MCG/0.3ML IJ SOLN
150.0000 ug | INTRAMUSCULAR | Status: DC
  Administered 2011-10-28: 150 ug via SUBCUTANEOUS
  Filled 2011-10-28: qty 0.3

## 2011-11-02 ENCOUNTER — Telehealth: Payer: Self-pay | Admitting: Cardiology

## 2011-11-02 NOTE — Telephone Encounter (Signed)
Pt is needing paperwork filled out for [employer] stating that he is OK to drive.  He will bring it by this afternoon

## 2011-11-02 NOTE — Telephone Encounter (Signed)
New msg Pt received paperwork from [facility] and he wanted to discuss with you. Please call

## 2011-11-05 ENCOUNTER — Encounter (HOSPITAL_COMMUNITY)
Admission: RE | Admit: 2011-11-05 | Discharge: 2011-11-05 | Disposition: A | Discharge status: Home / Self Care | Payer: Medicare Other | Source: Ambulatory Visit | Attending: Nephrology | Admitting: Nephrology

## 2011-11-05 LAB — RENAL FUNCTION PANEL
Albumin: 3.7 g/dL (ref 3.5–5.2)
BUN: 46 mg/dL — ABNORMAL HIGH (ref 6–23)
CO2: 22 mEq/L (ref 19–32)
Chloride: 102 mEq/L (ref 96–112)
Creatinine, Ser: 2.9 mg/dL — ABNORMAL HIGH (ref 0.50–1.35)
Potassium: 4.2 mEq/L (ref 3.5–5.1)

## 2011-11-05 LAB — POCT HEMOGLOBIN-HEMACUE: Hemoglobin: 8 g/dL — ABNORMAL LOW (ref 13.0–17.0)

## 2011-11-05 LAB — IRON AND TIBC: Iron: 92 ug/dL (ref 42–135)

## 2011-11-05 MED ORDER — DARBEPOETIN ALFA-POLYSORBATE 150 MCG/0.3ML IJ SOLN
150.0000 ug | INTRAMUSCULAR | Status: DC
  Administered 2011-11-05: 150 ug via SUBCUTANEOUS
  Filled 2011-11-05: qty 0.3

## 2011-11-08 LAB — PTH, INTACT AND CALCIUM
Calcium, Total (PTH): 11 mg/dL — ABNORMAL HIGH (ref 8.4–10.5)
PTH: 4.4 pg/mL — ABNORMAL LOW (ref 14.0–72.0)

## 2011-11-11 ENCOUNTER — Other Ambulatory Visit (HOSPITAL_COMMUNITY): Payer: Self-pay | Admitting: *Deleted

## 2011-11-12 ENCOUNTER — Encounter (HOSPITAL_COMMUNITY)
Admission: RE | Admit: 2011-11-12 | Discharge: 2011-11-12 | Disposition: A | Discharge status: Home / Self Care | Payer: Medicare Other | Source: Ambulatory Visit | Attending: Plastic Surgery | Admitting: Plastic Surgery

## 2011-11-12 LAB — RENAL FUNCTION PANEL
Albumin: 3.5 g/dL (ref 3.5–5.2)
BUN: 43 mg/dL — ABNORMAL HIGH (ref 6–23)
Chloride: 102 mEq/L (ref 96–112)
Creatinine, Ser: 3.22 mg/dL — ABNORMAL HIGH (ref 0.50–1.35)
GFR calc non Af Amer: 16 mL/min — ABNORMAL LOW (ref >90)
Phosphorus: 4.9 mg/dL — ABNORMAL HIGH (ref 2.3–4.6)
Potassium: 4.4 mEq/L (ref 3.5–5.1)

## 2011-11-12 MED ORDER — DARBEPOETIN ALFA-POLYSORBATE 150 MCG/0.3ML IJ SOLN
150.0000 ug | INTRAMUSCULAR | Status: DC
  Administered 2011-11-12: 150 ug via SUBCUTANEOUS
  Filled 2011-11-12: qty 0.3

## 2011-11-17 ENCOUNTER — Ambulatory Visit (INDEPENDENT_AMBULATORY_CARE_PROVIDER_SITE_OTHER): Payer: Medicare Other | Admitting: *Deleted

## 2011-11-17 DIAGNOSIS — I4891 Unspecified atrial fibrillation: Secondary | ICD-10-CM

## 2011-11-17 DIAGNOSIS — Z7901 Long term (current) use of anticoagulants: Secondary | ICD-10-CM

## 2011-11-17 LAB — POCT INR: INR: 1.7

## 2011-11-17 NOTE — Patient Instructions (Signed)
Instructed to call coumadin clinic or for Dr Wynelle Link to call coumadin clinic if a date for surgery is scheduled, we will need instructions on how long to hold coumadin and to clear with Dr Antoine Poche.

## 2011-11-18 ENCOUNTER — Other Ambulatory Visit: Payer: Self-pay | Admitting: Urology

## 2011-11-19 ENCOUNTER — Encounter (HOSPITAL_COMMUNITY)
Admission: RE | Admit: 2011-11-19 | Discharge: 2011-11-19 | Disposition: A | Discharge status: Home / Self Care | Payer: Medicare Other | Source: Ambulatory Visit | Attending: Nephrology | Admitting: Nephrology

## 2011-11-19 MED ORDER — DARBEPOETIN ALFA-POLYSORBATE 150 MCG/0.3ML IJ SOLN
150.0000 ug | INTRAMUSCULAR | Status: DC
  Administered 2011-11-19: 150 ug via SUBCUTANEOUS

## 2011-11-19 MED ORDER — DARBEPOETIN ALFA-POLYSORBATE 150 MCG/0.3ML IJ SOLN
INTRAMUSCULAR | Status: AC
  Filled 2011-11-19: qty 0.3

## 2011-11-25 ENCOUNTER — Other Ambulatory Visit (HOSPITAL_COMMUNITY): Payer: Self-pay | Admitting: *Deleted

## 2011-11-25 ENCOUNTER — Ambulatory Visit (INDEPENDENT_AMBULATORY_CARE_PROVIDER_SITE_OTHER): Payer: Medicare Other | Admitting: Cardiology

## 2011-11-25 ENCOUNTER — Encounter: Payer: Self-pay | Admitting: Cardiology

## 2011-11-25 VITALS — BP 160/80 | HR 76 | Ht 73.0 in | Wt 174.8 lb

## 2011-11-25 DIAGNOSIS — I428 Other cardiomyopathies: Secondary | ICD-10-CM

## 2011-11-25 LAB — BASIC METABOLIC PANEL
BUN: 44 mg/dL — ABNORMAL HIGH (ref 6–23)
Chloride: 105 mEq/L (ref 96–112)
GFR: 18.41 mL/min — ABNORMAL LOW (ref >60.00)
Glucose, Bld: 110 mg/dL — ABNORMAL HIGH (ref 70–99)
Potassium: 4.3 mEq/L (ref 3.5–5.1)
Sodium: 136 mEq/L (ref 135–145)

## 2011-11-25 NOTE — Progress Notes (Signed)
HPI He has a history of CAD, status post prior PCI to the OM in 1997 and stenting to the mid to distal RCA in 2001, HTN, hyperlipidemia, stage III CKD and prior TIA. Last LHC 9/06: LM ok, pLAD 50%, mLAD 60% then 60-70%, mRI 50%, mOM1 80-90% (small), pRCA and dRCA stents ok, PDA 50%, EF 65%. Thus, he had high-grade small vessel disease and diffuse nonobstructive large vessel disease. This was treated medically. Last Myoview 5/12: No ischemia, EF 64%.   Admitted 01/2011 with NSTEMI in the setting of diastolic heart failure. This was c/b AFib with RVR. He was started on Coumadin. With his CKD, age and recent low risk Myoview, medical therapy was pursued. Echocardiogram 02/17/11: Severe LVH, asymmetric hypertrophy, EF 50-55%, normal wall motion, high ventricular filling pressure, mild LAE, mild RVE, mildly reduced RVSF, mild RAE.   He is now being evaluated for prostatic hypertrophy and is to undergo surgical treatment of this. He's here for followup of his cardiac problems as well as preoperative clearance. Since he was last seen she's had no acute cardiovascular complaints. He's not as active as he used to be. He denies any overt symptoms such as PND or orthopnea. He's not really noticing palpitations and has had no presyncope or syncope since amlodipine was stopped recently. He's not had any chest pressure, neck or arm discomfort. He can achieve at least 5 METS of activity routinely.  Allergies  Allergen Reactions  . Oxycodone Hcl Other (See Comments)    Wanted to climb the wall  . Prednisone Other (See Comments)    Gain weight     Current Outpatient Prescriptions  Medication Sig Dispense Refill  . acetaminophen (TYLENOL) 500 MG tablet Take 500 mg by mouth every 6 (six) hours as needed.      . cloNIDine (CATAPRES) 0.1 MG tablet Take 0.1 mg by mouth daily.       . darbepoetin (ARANESP) 150 MCG/0.3ML SOLN Inject 150 mcg into the skin every 7 (seven) days.      . isosorbide mononitrate (IMDUR)  30 MG 24 hr tablet TAKE ONE TABLET BY MOUTH EVERY DAY  30 tablet  12  . metoprolol (LOPRESSOR) 50 MG tablet Take 50 mg by mouth at bedtime.       . nitroGLYCERIN (NITROSTAT) 0.4 MG SL tablet Place 0.4 mg under the tongue every 5 (five) minutes as needed. Chest pain        . rosuvastatin (CRESTOR) 20 MG tablet Take 10 mg by mouth at bedtime.      . Tamsulosin HCl (FLOMAX) 0.4 MG CAPS Take 0.4 mg by mouth at bedtime. Take 2 tabs daily      . warfarin (COUMADIN) 5 MG tablet Take 2.5 mg by mouth daily.       Marland Kitchen DISCONTD: isosorbide mononitrate (IMDUR) 30 MG 24 hr tablet Take 1 tablet (30 mg total) by mouth daily.  30 tablet  6    Past Medical History  Diagnosis Date  . CAD (coronary artery disease)     status post prior PCI to the obtuse marginal in 1997 and stenting to the mid to distal RCA in 2001; LHC 9/06:  LM ok, pLAD 50%, mLAD 60% then 60-70%, mRI 50%, mOM1 80-90% (small), pRCA and dRCA stents ok, PDA 50%, EF 65%;  Myoview 5/12: No ischemia, EF 64%.;  NSTEMI 11/12 tx medically   . Chronic kidney disease, stage III (moderate)   . TIA (transient ischemic attack)   . HTN (hypertension)   .  Hyperlipidemia   . AF (atrial fibrillation) 02/16/11    Coumadin  . Arthritis     "in my left ankle"  . Chronic diastolic heart failure   . Anemia of chronic disease   . Carotid stenosis     dopplers 03/2011: 0-39% bilateral    Past Surgical History  Procedure Date  . Nose surgery 1985    deviated septum  . Total knee arthroplasty 2009    left  . Joint replacement 2009    left knee  . Appendectomy 1930's  . Tonsillectomy and adenoidectomy 1930's  . Coronary angioplasty with stent placement     3 procedures; 3 stents total  . Cataract extraction w/ intraocular lens  implant, bilateral   . Surgery scrotal / testicular 1971    "testicle crushed; removed"    ROS:  Poor appetite.  Otherwise as stated in the HPI and negative for all other systems.  PHYSICAL EXAM BP 160/80  Pulse 76  Ht 6'  1" (1.854 m)  Wt 174 lb 12.8 oz (79.289 kg)  BMI 23.06 kg/m2 GENERAL:  Thinner than previous but in no distress HEENT:  Pupils equal round and reactive, fundi not visualized, oral mucosa unremarkable NECK:  No jugular venous distention, waveform within normal limits, carotid upstroke brisk and symmetric, no bruits, no thyromegaly LYMPHATICS:  No cervical, inguinal adenopathy LUNGS:  Clear to auscultation bilaterally BACK:  No CVA tenderness CHEST:  Unremarkable HEART:  PMI not displaced or sustained,S1 and S2 within normal limits, no S3,  no clicks, no rubs, no murmurs, irregular ABD:  Flat, positive bowel sounds normal in frequency in pitch, no bruits, no rebound, no guarding, no midline pulsatile mass, no hepatomegaly, no splenomegaly EXT:  2 plus pulses throughout, no edema, no cyanosis no clubbing SKIN:  No rashes no nodules NEURO:  Cranial nerves II through XII grossly intact, motor grossly intact throughout [facility]:  Cognitively intact, oriented to person place and time  EKG:  Atrial fibrillation, left axis deviation, left anterior fascicular block, lateral T-wave inversions unchanged from previous 11/25/2011  ASSESSMENT AND PLAN  Preoperative exam -  The patient has had no new symptoms since a stress perfusion study in May of last year. His non-Q-wave MI last fall was related to the A. fib. At this point there is no indication or higher risk for the planned procedure according to ACC/AHA guidelines. No further cardiovascular testing is suggested. He is at acceptable risk.  Atrial fibrillation -  Mr. Vania Rea Crumby has a CHA2DS2 - VASc score of 5 with a risk of stroke of 6.7% and a HAS - BLED score of 3.with one validation study suggesting a risk of 3.72 bleeds per 100 patient years. The patient tolerates this rhythm and rate control and anticoagulation. He can hold his Coumadin 5 days prior to the planned urologic procedure with no need for bridging. He should be restarted when it is felt  to be safe from a surgical standpoint.   DYSPNEA ON EXERTION -  He has had no new exacerbation of this. No change in therapy is indicated.  Essential hypertension, benign -  His blood pressure is mildly elevated today but he has been symptomatic with higher doses of medications. I will not make further change his medications.  RENAL INSUFFICIENCY -  This is followed and stable. Per the request of Dr. Caryn Section we will check a basic metabolic profile today.  Pancytopenia -  He has a myeloproliferative disorder and is being followed and managed by  hematology.

## 2011-11-25 NOTE — Patient Instructions (Addendum)
The current medical regimen is effective;  continue present plan and medications.  Please have blood work today.  Follow up in 6 months with Dr Hochrein.  You will receive a letter in the mail 2 months before you are due.  Please call us when you receive this letter to schedule your follow up appointment.  

## 2011-11-26 ENCOUNTER — Encounter (HOSPITAL_COMMUNITY)
Admission: RE | Admit: 2011-11-26 | Discharge: 2011-11-26 | Disposition: A | Discharge status: Home / Self Care | Payer: Medicare Other | Source: Ambulatory Visit | Attending: Nephrology | Admitting: Nephrology

## 2011-11-26 ENCOUNTER — Encounter: Payer: Self-pay | Admitting: Pulmonary Disease

## 2011-11-26 ENCOUNTER — Ambulatory Visit (INDEPENDENT_AMBULATORY_CARE_PROVIDER_SITE_OTHER): Payer: Medicare Other | Admitting: Pulmonary Disease

## 2011-11-26 VITALS — BP 118/68 | HR 66 | Temp 97.4°F | Ht 73.0 in | Wt 177.6 lb

## 2011-11-26 DIAGNOSIS — R911 Solitary pulmonary nodule: Secondary | ICD-10-CM

## 2011-11-26 DIAGNOSIS — R040 Epistaxis: Secondary | ICD-10-CM

## 2011-11-26 DIAGNOSIS — J8410 Pulmonary fibrosis, unspecified: Secondary | ICD-10-CM

## 2011-11-26 LAB — POCT HEMOGLOBIN-HEMACUE: Hemoglobin: 10.7 g/dL — ABNORMAL LOW (ref 13.0–17.0)

## 2011-11-26 MED ORDER — DARBEPOETIN ALFA-POLYSORBATE 150 MCG/0.3ML IJ SOLN
INTRAMUSCULAR | Status: AC
  Filled 2011-11-26: qty 0.3

## 2011-11-26 MED ORDER — DARBEPOETIN ALFA-POLYSORBATE 150 MCG/0.3ML IJ SOLN
150.0000 ug | INTRAMUSCULAR | Status: DC
  Administered 2011-11-26: 150 ug via SUBCUTANEOUS

## 2011-11-26 NOTE — Assessment & Plan Note (Signed)
ENT eval to see if local therapy can help - on coumadin

## 2011-11-26 NOTE — Assessment & Plan Note (Signed)
pulm rehab

## 2011-11-26 NOTE — Patient Instructions (Addendum)
ENT evaluation for nasal bleeding Enroll in pulm rehab when called Rpt CT scan of chest in November Good luck with prostate surgery

## 2011-11-26 NOTE — Assessment & Plan Note (Signed)
FU CT in nov '13

## 2011-11-26 NOTE — Progress Notes (Signed)
Subjective:    Patient ID: Tracy [name], male    DOB: 05-10-1919, 78 y.o.   MRN: [id number]  HPI PCP - Rosezetta Schlatter  78 y.o remote ex-smoker, retired [employer] account executive ,for FU of dyspnea and abnormal PFTs.  He was admitted 01/2011 with NSTEMI in the setting of diastolic heart failure. This was c/b AFib with RVR requiring Coumadin. With his CKD, age and low risk Myoview in 5/12, medical therapy was pursued. Echocardiogram 02/17/11 showed Severe LVH, asymmetric hypertrophy, EF 50-55%, normal wall motion, high ventricular filling pressure, mildly reduced RVSF, mild RAE.  He complains of dyspnea since then and difficulty in taking a deep breath. He denies wheezing, chest pain, orthopnea or paroxysmal nocturnal dyspnea. He has swelling in his left lower extremity since his knee replacement and uses compression stockings.  PFTs showed moderately reduced diffusing capacity at 56% and normal FEV1. Ratio was reduced but this is normal for his age . He did not desaturate on exertion. His wife is concerned that rehabilitation has not been started. Review of his x-rays shows pulmonary edema in 11/12 in stable bibasal scarring on 08/04/2011. He quit smoking in 1980 and smoked about 30-pack-years. 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  He has a history of CAD, status post stent to RCA in 2001, HTN, hyperlipidemia, stage III CKD and prior TIA. Last [facility] 9/06 showed high-grade small vessel disease and diffuse nonobstructive large vessel disease.  He has been evaluated by Dr. [name] for pancytopenia. Had BM biopsy with possible dx of myelodysplastic syndrome. Holter monitoring demonstrated atrial fibrillation. He did have 2 short runs of a wide complex tachycardia probably ventricular. He had no symptoms with this.  Labs - BNP 601- lower than previous. He has stable renal insufficiency with a creatinine of 3.0. He is anemic with a hemoglobin of 8.6 - on iron and followed by hematology   HRCT showed  dependent intralobular septal thickening, along with panlobar and para septal emphysema, likely a combination of chronic emphysema and mild cardiogenic edema.  There are several bilateral subpleural nodules. The largest is in the posterior right lung base and measures 2 cm. Favored to be round atelectasis / scarring rather than a neoplastic process.  There are several enlarged mediastinal lymph nodes, one of which is coarsely calcified which suggests old granulomatous disease  US abdomen - showed dilated cbd  Ct abdomen( ( magod) reviewed with radiologist. The 2 cm nodule is smaller now 20 lb wt loss reported  Bun/ cr 53/3.3 - slight worse   11/26/2011 6wk FU still has not been set up with pulm rehab. breathing is worse, some cough, wheezing, chest tx unchanged BM biospy 3/13 neg Anemia  -hb 6 improved to 10 with transfusions C/o epistaxis with throat clearing & then coughs up some blood Urodynamic studies - incomplete emptying - TURP planned October, renal (fox) following, cr up to 3.3    Review of Systems neg for any significant sore throat, dysphagia, itching, sneezing, nasal congestion or excess/ purulent secretions, fever, chills, sweats, unintended wt loss, pleuritic or exertional cp, hempoptysis, orthopnea pnd or change in chronic leg swelling. Also denies presyncope, palpitations, heartburn, abdominal pain, nausea, vomiting, diarrhea or change in bowel or urinary habits, dysuria,hematuria, rash, arthralgias, visual complaints, headache, numbness weakness or ataxia.     Objective:   Physical Exam  Gen. Pleasant, well-nourished, in no distress ENT - no lesions, no post nasal drip, crusted bloo dinleft nostril Neck: No JVD, no thyromegaly, no  carotid bruits Lungs: no use of accessory muscles, no dullness to percussion, bibasal rales L >> rt, no rhonchi  Cardiovascular: Rhythm regular, heart sounds  normal, no murmurs or gallops, no peripheral edema Musculoskeletal: No deformities, no  cyanosis or clubbing         Assessment & Plan:

## 2011-11-30 ENCOUNTER — Telehealth: Payer: Self-pay | Admitting: Pulmonary Disease

## 2011-11-30 NOTE — Telephone Encounter (Signed)
lmomtcb x1 for Tracy [name]. Pt has not heard from pulm rehab. Pt is wanting to know when he should expect a call from them

## 2011-12-01 ENCOUNTER — Ambulatory Visit (INDEPENDENT_AMBULATORY_CARE_PROVIDER_SITE_OTHER): Payer: Medicare Other | Admitting: *Deleted

## 2011-12-01 DIAGNOSIS — I4891 Unspecified atrial fibrillation: Secondary | ICD-10-CM

## 2011-12-01 DIAGNOSIS — Z7901 Long term (current) use of anticoagulants: Secondary | ICD-10-CM

## 2011-12-01 LAB — POCT INR: INR: 2.7

## 2011-12-01 NOTE — Telephone Encounter (Signed)
lmomtcb x2 for [employer]

## 2011-12-02 ENCOUNTER — Other Ambulatory Visit (HOSPITAL_COMMUNITY): Payer: Self-pay | Admitting: *Deleted

## 2011-12-02 NOTE — Telephone Encounter (Signed)
Returning Mindy's call can be reached at [phone].Tracy [name]

## 2011-12-02 NOTE — Telephone Encounter (Signed)
lmomtcb x1 for pt 

## 2011-12-02 NOTE — Telephone Encounter (Signed)
Spoke with Liborio Nixon at [employer] and she states [facility] is checking on this and will call us back. Carron Curie, CMA

## 2011-12-02 NOTE — Telephone Encounter (Signed)
Pt returning call

## 2011-12-02 NOTE — Telephone Encounter (Signed)
I spoke with Tracy [name] and she stated she has not started on the referrals they received from July but will pull pt's chart to get him restarted with Rehab.

## 2011-12-02 NOTE — Telephone Encounter (Signed)
I spoke with pt and is aware. Nothing further was needed 

## 2011-12-03 ENCOUNTER — Encounter (HOSPITAL_COMMUNITY): Payer: Self-pay | Admitting: Emergency Medicine

## 2011-12-03 ENCOUNTER — Inpatient Hospital Stay (HOSPITAL_COMMUNITY)
Admission: EM | Admit: 2011-12-03 | Discharge: 2011-12-10 | DRG: 640 | Disposition: A | Discharge status: Home / Self Care | Payer: Medicare Other | Attending: Family Medicine | Admitting: Family Medicine

## 2011-12-03 ENCOUNTER — Encounter (HOSPITAL_COMMUNITY)
Admission: RE | Admit: 2011-12-03 | Discharge: 2011-12-03 | Disposition: A | Discharge status: Home / Self Care | Payer: Medicare Other | Source: Ambulatory Visit | Attending: Nephrology | Admitting: Nephrology

## 2011-12-03 DIAGNOSIS — I129 Hypertensive chronic kidney disease with stage 1 through stage 4 chronic kidney disease, or unspecified chronic kidney disease: Secondary | ICD-10-CM | POA: Diagnosis present

## 2011-12-03 DIAGNOSIS — R040 Epistaxis: Secondary | ICD-10-CM

## 2011-12-03 DIAGNOSIS — J189 Pneumonia, unspecified organism: Secondary | ICD-10-CM | POA: Diagnosis present

## 2011-12-03 DIAGNOSIS — D638 Anemia in other chronic diseases classified elsewhere: Secondary | ICD-10-CM | POA: Diagnosis present

## 2011-12-03 DIAGNOSIS — I10 Essential (primary) hypertension: Secondary | ICD-10-CM | POA: Diagnosis present

## 2011-12-03 DIAGNOSIS — I498 Other specified cardiac arrhythmias: Secondary | ICD-10-CM

## 2011-12-03 DIAGNOSIS — I6529 Occlusion and stenosis of unspecified carotid artery: Secondary | ICD-10-CM

## 2011-12-03 DIAGNOSIS — D61818 Other pancytopenia: Secondary | ICD-10-CM

## 2011-12-03 DIAGNOSIS — R64 Cachexia: Secondary | ICD-10-CM

## 2011-12-03 DIAGNOSIS — Z7901 Long term (current) use of anticoagulants: Secondary | ICD-10-CM

## 2011-12-03 DIAGNOSIS — N184 Chronic kidney disease, stage 4 (severe): Secondary | ICD-10-CM | POA: Diagnosis present

## 2011-12-03 DIAGNOSIS — Z8673 Personal history of transient ischemic attack (TIA), and cerebral infarction without residual deficits: Secondary | ICD-10-CM

## 2011-12-03 DIAGNOSIS — E785 Hyperlipidemia, unspecified: Secondary | ICD-10-CM

## 2011-12-03 DIAGNOSIS — R197 Diarrhea, unspecified: Secondary | ICD-10-CM | POA: Diagnosis present

## 2011-12-03 DIAGNOSIS — R55 Syncope and collapse: Secondary | ICD-10-CM

## 2011-12-03 DIAGNOSIS — Z96659 Presence of unspecified artificial knee joint: Secondary | ICD-10-CM

## 2011-12-03 DIAGNOSIS — R911 Solitary pulmonary nodule: Secondary | ICD-10-CM

## 2011-12-03 DIAGNOSIS — J8410 Pulmonary fibrosis, unspecified: Secondary | ICD-10-CM

## 2011-12-03 DIAGNOSIS — N259 Disorder resulting from impaired renal tubular function, unspecified: Secondary | ICD-10-CM

## 2011-12-03 DIAGNOSIS — I2510 Atherosclerotic heart disease of native coronary artery without angina pectoris: Secondary | ICD-10-CM

## 2011-12-03 DIAGNOSIS — N400 Enlarged prostate without lower urinary tract symptoms: Secondary | ICD-10-CM | POA: Diagnosis present

## 2011-12-03 DIAGNOSIS — I4891 Unspecified atrial fibrillation: Secondary | ICD-10-CM

## 2011-12-03 DIAGNOSIS — I428 Other cardiomyopathies: Secondary | ICD-10-CM

## 2011-12-03 DIAGNOSIS — R0609 Other forms of dyspnea: Secondary | ICD-10-CM

## 2011-12-03 DIAGNOSIS — I509 Heart failure, unspecified: Secondary | ICD-10-CM | POA: Diagnosis present

## 2011-12-03 DIAGNOSIS — I252 Old myocardial infarction: Secondary | ICD-10-CM

## 2011-12-03 DIAGNOSIS — I5032 Chronic diastolic (congestive) heart failure: Secondary | ICD-10-CM

## 2011-12-03 DIAGNOSIS — I214 Non-ST elevation (NSTEMI) myocardial infarction: Secondary | ICD-10-CM

## 2011-12-03 DIAGNOSIS — E876 Hypokalemia: Secondary | ICD-10-CM | POA: Diagnosis present

## 2011-12-03 DIAGNOSIS — K920 Hematemesis: Secondary | ICD-10-CM | POA: Diagnosis present

## 2011-12-03 DIAGNOSIS — D469 Myelodysplastic syndrome, unspecified: Secondary | ICD-10-CM | POA: Diagnosis present

## 2011-12-03 DIAGNOSIS — I4949 Other premature depolarization: Secondary | ICD-10-CM

## 2011-12-03 DIAGNOSIS — N183 Chronic kidney disease, stage 3 unspecified: Secondary | ICD-10-CM

## 2011-12-03 DIAGNOSIS — F29 Unspecified psychosis not due to a substance or known physiological condition: Secondary | ICD-10-CM | POA: Diagnosis present

## 2011-12-03 LAB — RENAL FUNCTION PANEL
BUN: 51 mg/dL — ABNORMAL HIGH (ref 6–23)
CO2: 22 mEq/L (ref 19–32)
Calcium: >15 mg/dL (ref 8.4–10.5)
Creatinine, Ser: 3.4 mg/dL — ABNORMAL HIGH (ref 0.50–1.35)
Glucose, Bld: 118 mg/dL — ABNORMAL HIGH (ref 70–99)

## 2011-12-03 LAB — BASIC METABOLIC PANEL
CO2: 23 mEq/L (ref 19–32)
Calcium: >15 mg/dL (ref 8.4–10.5)
Chloride: 101 mEq/L (ref 96–112)
Creatinine, Ser: 3.43 mg/dL — ABNORMAL HIGH (ref 0.50–1.35)
Glucose, Bld: 126 mg/dL — ABNORMAL HIGH (ref 70–99)
Sodium: 135 mEq/L (ref 135–145)

## 2011-12-03 LAB — CBC
Hemoglobin: 11.5 g/dL — ABNORMAL LOW (ref 13.0–17.0)
MCH: 29.3 pg (ref 26.0–34.0)
MCV: 95.9 fL (ref 78.0–100.0)
RBC: 3.93 MIL/uL — ABNORMAL LOW (ref 4.22–5.81)

## 2011-12-03 LAB — IRON AND TIBC
Iron: 165 ug/dL — ABNORMAL HIGH (ref 42–135)
TIBC: 274 ug/dL (ref 215–435)

## 2011-12-03 MED ORDER — SODIUM CHLORIDE 0.9 % IV SOLN
90.0000 mg | Freq: Once | INTRAVENOUS | Status: AC
  Administered 2011-12-04: 90 mg via INTRAVENOUS
  Filled 2011-12-03: qty 10

## 2011-12-03 MED ORDER — SODIUM CHLORIDE 0.9 % IV BOLUS (SEPSIS)
1000.0000 mL | Freq: Once | INTRAVENOUS | Status: AC
  Administered 2011-12-04: 1000 mL via INTRAVENOUS

## 2011-12-03 MED ORDER — FUROSEMIDE 10 MG/ML IJ SOLN
40.0000 mg | Freq: Once | INTRAMUSCULAR | Status: AC
  Administered 2011-12-04: 40 mg via INTRAVENOUS
  Filled 2011-12-03: qty 4

## 2011-12-03 MED ORDER — DARBEPOETIN ALFA-POLYSORBATE 150 MCG/0.3ML IJ SOLN
150.0000 ug | INTRAMUSCULAR | Status: DC
Start: 2011-12-03 — End: 2011-12-04

## 2011-12-03 NOTE — ED Notes (Signed)
Pt was seen at short stay today and received a phone call at home to come to ED for elevated Calcium (">15").  Pt c/o slurring words and "lethargic" x 3 days.  Also c/o L ankle and L leg pain.  Pt seen by orthopedist for leg pain today and had "normal" x-rays.  Pt was told to stop taking Crestor per orthopedist due to possible side effects of medication.

## 2011-12-04 ENCOUNTER — Inpatient Hospital Stay (HOSPITAL_COMMUNITY): Payer: Medicare Other

## 2011-12-04 DIAGNOSIS — R64 Cachexia: Secondary | ICD-10-CM | POA: Diagnosis present

## 2011-12-04 DIAGNOSIS — N184 Chronic kidney disease, stage 4 (severe): Secondary | ICD-10-CM | POA: Diagnosis present

## 2011-12-04 DIAGNOSIS — N183 Chronic kidney disease, stage 3 (moderate): Secondary | ICD-10-CM

## 2011-12-04 LAB — URINALYSIS, ROUTINE W REFLEX MICROSCOPIC
Bilirubin Urine: NEGATIVE
Glucose, UA: NEGATIVE mg/dL
Ketones, ur: NEGATIVE mg/dL
Protein, ur: 30 mg/dL — AB

## 2011-12-04 LAB — CBC
Hemoglobin: 11.8 g/dL — ABNORMAL LOW (ref 13.0–17.0)
MCH: 29.6 pg (ref 26.0–34.0)
MCV: 95.7 fL (ref 78.0–100.0)
RBC: 3.99 MIL/uL — ABNORMAL LOW (ref 4.22–5.81)

## 2011-12-04 LAB — HIV ANTIBODY (ROUTINE TESTING W REFLEX): HIV: NONREACTIVE

## 2011-12-04 LAB — URINE MICROSCOPIC-ADD ON

## 2011-12-04 LAB — PROTIME-INR
INR: 3.08 — ABNORMAL HIGH (ref 0.00–1.49)
Prothrombin Time: 32.3 seconds — ABNORMAL HIGH (ref 11.6–15.2)

## 2011-12-04 LAB — HEPATIC FUNCTION PANEL
ALT: 17 U/L (ref 0–53)
Albumin: 3.8 g/dL (ref 3.5–5.2)
Alkaline Phosphatase: 72 U/L (ref 39–117)
Total Bilirubin: 1 mg/dL (ref 0.3–1.2)

## 2011-12-04 LAB — BASIC METABOLIC PANEL
BUN: 48 mg/dL — ABNORMAL HIGH (ref 6–23)
CO2: 23 mEq/L (ref 19–32)
Glucose, Bld: 97 mg/dL (ref 70–99)
Potassium: 4 mEq/L (ref 3.5–5.1)
Sodium: 136 mEq/L (ref 135–145)

## 2011-12-04 MED ORDER — WARFARIN SODIUM 5 MG PO TABS: 5.0000 mg | ORAL_TABLET | Freq: Every day | ORAL | Status: DC

## 2011-12-04 MED ORDER — METOPROLOL TARTRATE 50 MG PO TABS
50.0000 mg | ORAL_TABLET | Freq: Every day | ORAL | Status: DC
  Administered 2011-12-04 – 2011-12-09 (×6): 50 mg via ORAL
  Filled 2011-12-04 (×9): qty 1

## 2011-12-04 MED ORDER — WARFARIN SODIUM 1 MG PO TABS
1.0000 mg | ORAL_TABLET | Freq: Once | ORAL | Status: AC
  Administered 2011-12-04: 1 mg via ORAL
  Filled 2011-12-04: qty 1

## 2011-12-04 MED ORDER — OXYCODONE HCL 5 MG PO TABS: 5.0000 mg | ORAL_TABLET | ORAL | Status: DC | PRN

## 2011-12-04 MED ORDER — ONDANSETRON HCL 4 MG/2ML IJ SOLN
4.0000 mg | Freq: Four times a day (QID) | INTRAMUSCULAR | Status: DC | PRN
  Administered 2011-12-04 – 2011-12-06 (×2): 4 mg via INTRAVENOUS
  Filled 2011-12-04 (×2): qty 2

## 2011-12-04 MED ORDER — FUROSEMIDE 10 MG/ML IJ SOLN
40.0000 mg | Freq: Two times a day (BID) | INTRAMUSCULAR | Status: DC
  Administered 2011-12-04 – 2011-12-05 (×2): 40 mg via INTRAVENOUS
  Filled 2011-12-04 (×4): qty 4

## 2011-12-04 MED ORDER — CLONIDINE HCL 0.1 MG PO TABS
0.1000 mg | ORAL_TABLET | Freq: Every day | ORAL | Status: DC
  Administered 2011-12-04 – 2011-12-10 (×7): 0.1 mg via ORAL
  Filled 2011-12-04 (×7): qty 1

## 2011-12-04 MED ORDER — ACETAMINOPHEN 650 MG RE SUPP: 650.0000 mg | Freq: Four times a day (QID) | RECTAL | Status: DC | PRN

## 2011-12-04 MED ORDER — TAMSULOSIN HCL 0.4 MG PO CAPS
0.4000 mg | ORAL_CAPSULE | Freq: Every day | ORAL | Status: DC
  Administered 2011-12-04: 0.4 mg via ORAL
  Filled 2011-12-04 (×2): qty 1

## 2011-12-04 MED ORDER — DEXTROSE 5 % IV SOLN
1.0000 g | Freq: Once | INTRAVENOUS | Status: AC
  Administered 2011-12-04: 1 g via INTRAVENOUS
  Filled 2011-12-04: qty 10

## 2011-12-04 MED ORDER — ACETAMINOPHEN 325 MG PO TABS: 650.0000 mg | ORAL_TABLET | Freq: Four times a day (QID) | ORAL | Status: DC | PRN

## 2011-12-04 MED ORDER — DEXTROSE 5 % IV SOLN
1.0000 g | INTRAVENOUS | Status: DC
  Administered 2011-12-04: 1 g via INTRAVENOUS
  Filled 2011-12-04 (×2): qty 10

## 2011-12-04 MED ORDER — WARFARIN - PHARMACIST DOSING INPATIENT
Freq: Every day | Status: DC
  Administered 2011-12-06 – 2011-12-09 (×3)

## 2011-12-04 MED ORDER — ISOSORBIDE MONONITRATE ER 30 MG PO TB24
30.0000 mg | ORAL_TABLET | Freq: Every evening | ORAL | Status: DC
  Administered 2011-12-04 – 2011-12-09 (×6): 30 mg via ORAL
  Filled 2011-12-04 (×7): qty 1

## 2011-12-04 MED ORDER — DEXTROSE 5 % IV SOLN
500.0000 mg | Freq: Once | INTRAVENOUS | Status: AC
  Administered 2011-12-04: 500 mg via INTRAVENOUS
  Filled 2011-12-04: qty 500

## 2011-12-04 MED ORDER — HYDRALAZINE HCL 20 MG/ML IJ SOLN
10.0000 mg | Freq: Four times a day (QID) | INTRAMUSCULAR | Status: DC | PRN
  Administered 2011-12-04: 10 mg via INTRAVENOUS
  Filled 2011-12-04 (×2): qty 0.5

## 2011-12-04 MED ORDER — DEXTROSE 5 % IV SOLN
500.0000 mg | INTRAVENOUS | Status: DC
  Administered 2011-12-04: 500 mg via INTRAVENOUS
  Filled 2011-12-04 (×2): qty 500

## 2011-12-04 MED ORDER — SODIUM CHLORIDE 0.9 % IV SOLN
INTRAVENOUS | Status: DC
  Administered 2011-12-04 (×2): via INTRAVENOUS
  Administered 2011-12-04: 10000 mL via INTRAVENOUS
  Administered 2011-12-06 – 2011-12-07 (×2): 100 mL/h via INTRAVENOUS

## 2011-12-04 MED ORDER — ALUM & MAG HYDROXIDE-SIMETH 200-200-20 MG/5ML PO SUSP
30.0000 mL | Freq: Four times a day (QID) | ORAL | Status: DC | PRN
  Filled 2011-12-04: qty 30

## 2011-12-04 MED ORDER — ONDANSETRON HCL 4 MG PO TABS: 4.0000 mg | ORAL_TABLET | Freq: Four times a day (QID) | ORAL | Status: DC | PRN

## 2011-12-04 MED ORDER — CALCITONIN (SALMON) 200 UNIT/ML IJ SOLN
300.0000 [IU] | Freq: Two times a day (BID) | INTRAMUSCULAR | Status: AC
  Administered 2011-12-04 – 2011-12-06 (×4): 300 [IU] via INTRAMUSCULAR
  Filled 2011-12-04 (×6): qty 1.5

## 2011-12-04 MED ORDER — HYDROMORPHONE HCL PF 1 MG/ML IJ SOLN
0.5000 mg | INTRAMUSCULAR | Status: DC | PRN
  Administered 2011-12-08: 1 mg via INTRAVENOUS
  Filled 2011-12-04 (×2): qty 1

## 2011-12-04 MED ORDER — ZOLPIDEM TARTRATE 5 MG PO TABS
5.0000 mg | ORAL_TABLET | Freq: Every evening | ORAL | Status: DC | PRN
  Filled 2011-12-04: qty 1

## 2011-12-04 MED ORDER — NITROGLYCERIN 0.4 MG SL SUBL: 0.4000 mg | SUBLINGUAL_TABLET | SUBLINGUAL | Status: DC | PRN

## 2011-12-04 NOTE — Progress Notes (Signed)
Subjective: Feeling well denies any pain. No specific complaints. Unhappy with waiting in the ER and how long it took for him to be admitted.  Objective: Vital signs in last 24 hours: Filed Vitals:   12/04/11 0300 12/04/11 0500 12/04/11 0700 12/04/11 0800  BP:  158/113 149/78 153/97  Pulse:  93 103 96  Temp: 97.2 F (36.2 C) 97.8 F (36.6 C)  98.7 F (37.1 C)  TempSrc:  Oral  Oral  Resp:  20 22 21   Height: 6\' 1"  (1.854 m)     Weight: 77.5 kg (170 lb 13.7 oz)     SpO2:  92% 94% 93%   Weight change:   Intake/Output Summary (Last 24 hours) at 12/04/11 1010 Last data filed at 12/04/11 0900  Gross per 24 hour  Intake 1686.25 ml  Output   1375 ml  Net 311.25 ml    Physical Exam: General: Awake, Oriented, No acute distress. HEENT: EOMI. Neck: Supple CV: S1 and S2 Lungs: Clear to ascultation bilaterally Abdomen: Soft, Nontender, Nondistended, +bowel sounds. Ext: Good pulses. Trace edema.  Lab Results: Basic Metabolic Panel:  Lab 12/04/11 1308 12/03/11 2353 12/03/11 1730 12/03/11 1430  NA 136 -- 135 135  K 4.0 -- 4.7 4.6  CL 101 -- 101 101  CO2 23 -- 23 22  GLUCOSE 97 -- 126* 118*  BUN 48* -- 51* 51*  CREATININE 3.31* -- 3.43* 3.40*  CALCIUM 14.3* -- >15.0* >15.0*  MG -- -- -- --  PHOS -- 6.7* -- 6.5*   Liver Function Tests:  Lab 12/03/11 2353 12/03/11 1430  AST 20 --  ALT 17 --  ALKPHOS 72 --  BILITOT 1.0 --  PROT 7.4 --  ALBUMIN 3.8 3.9   No results found for this basename: LIPASE:5,AMYLASE:5 in the last 168 hours No results found for this basename: AMMONIA:5 in the last 168 hours CBC:  Lab 12/04/11 0655 12/03/11 1730 12/03/11 1405  WBC 5.9 3.1* --  NEUTROABS -- -- --  HGB 11.8* 11.5* 11.6*  HCT 38.2* 37.7* --  MCV 95.7 95.9 --  PLT 130* 132* --   Cardiac Enzymes: No results found for this basename: CKTOTAL:5,CKMB:5,CKMBINDEX:5,TROPONINI:5 in the last 168 hours BNP (last 3 results)  Basename 08/04/11 1241 02/16/11 1044  PROBNP 601.0* 3945.0*    CBG: No results found for this basename: GLUCAP:5 in the last 168 hours No results found for this basename: HGBA1C:5 in the last 72 hours Other Labs: No components found with this basename: POCBNP:3 No results found for this basename: DDIMER:2 in the last 168 hours No results found for this basename: CHOL:2,HDL:2,LDLCALC:2,TRIG:2,CHOLHDL:2,LDLDIRECT:2 in the last 168 hours No results found for this basename: TSH,T4TOTAL,FREET3,T3FREE,FREET4,THYROIDAB in the last 168 hours  Lab 12/03/11 1430  VITAMINB12 --  FOLATE --  FERRITIN 87  TIBC 274  IRON 165*  RETICCTPCT --    Micro Results: Recent Results (from the past 240 hour(s))  MRSA PCR SCREENING     Status: Normal   Collection Time   12/04/11  3:00 AM      Component Value Range Status Comment   MRSA by PCR NEGATIVE  NEGATIVE Final     Studies/Results: Dg Chest 2 View  12/04/2011  *RADIOLOGY REPORT*  Clinical Data: COPD.  CHEST - 2 VIEW  Comparison: 08/04/2011  Findings: New airspace disease is seen in the left lower lobe, consistent with pneumonia.  Changes of COPD again demonstrated. Mild cardiomegaly is stable.  No evidence of congestive heart failure or pleural effusion.  IMPRESSION:  1.  New left lower lobe airspace disease, consistent with pneumonia. 2.  COPD. 3.  Stable mild cardiomegaly.   Original Report Authenticated By: Danae Orleans, M.D.     Medications: I have reviewed the patient's current medications. Scheduled Meds:   . azithromycin  500 mg Intravenous Once  . cefTRIAXone (ROCEPHIN)  IV  1 g Intravenous Once  . cloNIDine  0.1 mg Oral Daily  . furosemide  40 mg Intravenous Once  . isosorbide mononitrate  30 mg Oral QPM  . metoprolol  50 mg Oral QHS  . pamidronate (AREDIA) 90 mg IVPB  90 mg Intravenous Once  . sodium chloride  1,000 mL Intravenous Once  . Tamsulosin HCl  0.4 mg Oral QHS  . warfarin  1 mg Oral ONCE-1800  . Warfarin - Pharmacist Dosing Inpatient   Does not apply q1800  . DISCONTD: warfarin  5  mg Oral Daily   Continuous Infusions:   . sodium chloride 75 mL/hr at 12/04/11 0439   PRN Meds:.acetaminophen, acetaminophen, alum & mag hydroxide-simeth, hydrALAZINE, HYDROmorphone (DILAUDID) injection, nitroGLYCERIN, ondansetron (ZOFRAN) IV, ondansetron, zolpidem, DISCONTD: oxyCODONE  Assessment/Plan: Hypercalcemia Etiology unclear. Patient reports that he drinks 3 glasses of milk daily, (breakfast, afternoon, and at bedtime). Received Pamidronate once on 12/04/2011. Continue IV fluids, received one dose of lasix. Does not report taking Vit D at home. Send for Vit D 25 and Vit D 1-25, PTH, and SPEP and UPEP. Requested renal consultation.  Community acquired pneumonia Ceftriaxone and azithromycin. Antibiotics since 12/04/2011. Stable.  Hyperlipidemia On statin. Held for the time being.  Hypertension Stable.  History of A. Fib Rate controlled. Coumadin per pharmacy.  CKD stage III Creatinine at baseline.  Anemia and Thrombocytopenia (Pancytopenia) due to Myelodysplastic syndrome Had bone marrow biopsy in March of 2013, myelodysplastic syndrome, Dr. Shirline Frees. Patient chose conservative approach. Hemoglobin and platelet count stable.  Coronary artery disease Stable.  Cachexia Encourage supplemental nutrition.  Prophylaxis SCDs  Disposition Pending.   LOS: 1 day  Teigan Sahli A, MD 12/04/2011, 10:10 AM

## 2011-12-04 NOTE — Progress Notes (Signed)
PAGED DR. Lovell Sheehan TO NOTIFY OF 2501 OF BP OF 180/120. AWAITING RETURN CALL. THIS IS MY 2ND PAGE.

## 2011-12-04 NOTE — Progress Notes (Signed)
PAGED DR. Lovell Sheehan TO GET ORDERS FOR PT-2501. WILL AWAIT RETURN CALL AND CONTINUE TO MONITOR.

## 2011-12-04 NOTE — Progress Notes (Signed)
ANTICOAGULATION CONSULT NOTE - Initial Consult  Pharmacy Consult for Coumadin Indication: atrial fibrillation  Allergies  Allergen Reactions  . Oxycodone Hcl Other (See Comments)    Wanted to climb the wall  . Prednisone Other (See Comments)    Gain weight     Patient Measurements: Height: 6\' 1"  (185.4 cm) Weight: 170 lb 13.7 oz (77.5 kg) IBW/kg (Calculated) : 79.9   Vital Signs: Temp: 97.2 F (36.2 C) (09/07 0300) Temp src: Oral (09/06 1640) BP: 140/117 mmHg (09/07 0215) Pulse Rate: 93  (09/07 0215)  Labs:  Basename 12/04/11 0019 12/03/11 1730 12/03/11 1430 12/03/11 1405 12/01/11 1224  HGB -- 11.5* -- 11.6* --  HCT -- 37.7* -- -- --  PLT -- 132* -- -- --  APTT -- -- -- -- --  LABPROT 32.3* -- -- -- --  INR 3.08* -- -- -- 2.7  HEPARINUNFRC -- -- -- -- --  CREATININE -- 3.43* 3.40* -- --  CKTOTAL -- -- -- -- --  CKMB -- -- -- -- --  TROPONINI -- -- -- -- --    Estimated Creatinine Clearance: 15.7 ml/min (by C-G formula based on Cr of 3.43).   Medical History: Past Medical History  Diagnosis Date  . CAD (coronary artery disease)     status post prior PCI to the obtuse marginal in 1997 and stenting to the mid to distal RCA in 2001; LHC 9/06:  LM ok, pLAD 50%, mLAD 60% then 60-70%, mRI 50%, mOM1 80-90% (small), pRCA and dRCA stents ok, PDA 50%, EF 65%;  Myoview 5/12: No ischemia, EF 64%.;  NSTEMI 11/12 tx medically   . Chronic kidney disease, stage III (moderate)   . TIA (transient ischemic attack)   . HTN (hypertension)   . Hyperlipidemia   . AF (atrial fibrillation) 02/16/11    Coumadin  . Arthritis     "in my left ankle"  . Chronic diastolic heart failure   . Anemia of chronic disease   . Carotid stenosis     dopplers 03/2011: 0-39% bilateral    Medications:  Scheduled:    . azithromycin  500 mg Intravenous Once  . cefTRIAXone (ROCEPHIN)  IV  1 g Intravenous Once  . cloNIDine  0.1 mg Oral Daily  . furosemide  40 mg Intravenous Once  . isosorbide  mononitrate  30 mg Oral QPM  . metoprolol  50 mg Oral QHS  . pamidronate (AREDIA) 90 mg IVPB  90 mg Intravenous Once  . sodium chloride  1,000 mL Intravenous Once  . Tamsulosin HCl  0.4 mg Oral QHS  . DISCONTD: warfarin  5 mg Oral Daily    Assessment: 78 yo male admitted with hypercalcemia. Pharmacy consulted to manage Coumadin for atrial fibrillation. INR 3.08. Per 9/4 anticoagulation note, home Coumadin regimen to be 5mg  daily, except 2.5mg  on Friday and Monday.   Goal of Therapy:  INR 2-3 Monitor platelets by anticoagulation protocol: Yes   Plan:  1. Coumadin 1mg  po today. 2. Daily PT / INR  Tracy [name], Tracy [name] 12/04/2011,4:25 AM

## 2011-12-04 NOTE — ED Notes (Addendum)
Pt was in short stay for his weekly Hgb injection, pt received a phone call from Dr. Caryn Section informing pt to return to ED for an elevated Calcium. Pt reports pain (L) leg, generalized abd region, (L) ankle, and kidneys, pt was also instructed to stop taking his daily Crestor. Spouse also reports pt has been feeling weak and was slurring his words today. Neuro intact, no facial droop, face symmetrical, no arm drift

## 2011-12-04 NOTE — Progress Notes (Signed)
DR. Lovell Sheehan RETURNED PAGE. NOTIFIED HER OF BP OF 180/120. SHE STATED "I WILL PUT IN AN ORDER." WILL WATCH FOR ORDER AND CONTINUE TO MONITOR PATIENT.

## 2011-12-04 NOTE — H&P (Signed)
[facility] History and Physical  ADI DORO XBJ:[id number] DOB: 1919-05-13 DOA: 12/03/2011  Referring physician: EDP PCP: [name], MD   Chief Complaint: Elevated Calcium Level on Labs  HPI: Tracy [name] is a 78 y.o. male who was called by his nephrologist, Dr. Caryn Section, after his lab results returned, and he was told to go immediately to the ED because of his calcium level of 15.  Patient reports going to his nephrologist to have bloodwork done.  He had been receiving Aranesp injections for his anemia, and and the last 2 times his hemoglobin had been 11 so the injections were discontinued for now.  He reports undergoing a workup for his prostate and is to have a procedure done.  He has had increased confusion and weakness over the past week, and he reports losing his appetite over the past 2 months and losing about 20 pounds.  He denies having any fevers or chills.     Review of Systems: The patient denies  fever, vision loss, decreased hearing, hoarseness, chest pain, syncope, dyspnea on exertion, peripheral edema, balance deficits, hemoptysis, abdominal pain, melena, hematochezia, severe indigestion/heartburn, hematuria, incontinence, genital sores, suspicious skin lesions, transient blindness, depression, unusual weight change, abnormal bleeding, enlarged lymph nodes, angioedema, and breast masses.    Past Medical History  Diagnosis Date  . CAD (coronary artery disease)     status post prior PCI to the obtuse marginal in 1997 and stenting to the mid to distal RCA in 2001; LHC 9/06:  LM ok, pLAD 50%, mLAD 60% then 60-70%, mRI 50%, mOM1 80-90% (small), pRCA and dRCA stents ok, PDA 50%, EF 65%;  Myoview 5/12: No ischemia, EF 64%.;  NSTEMI 11/12 tx medically   . Chronic kidney disease, stage III (moderate)   . TIA (transient ischemic attack)   . HTN (hypertension)   . Hyperlipidemia   . AF (atrial fibrillation) 02/16/11    Coumadin  . Arthritis     "in my left ankle"  . Chronic  diastolic heart failure   . Anemia of chronic disease   . Carotid stenosis     dopplers 03/2011: 0-39% bilateral   Past Surgical History  Procedure Date  . Nose surgery 1985    deviated septum  . Total knee arthroplasty 2009    left  . Joint replacement 2009    left knee  . Appendectomy 1930's  . Tonsillectomy and adenoidectomy 1930's  . Coronary angioplasty with stent placement     3 procedures; 3 stents total  . Cataract extraction w/ intraocular lens  implant, bilateral   . Surgery scrotal / testicular 1971    "testicle crushed; removed"     Prior to Admission medications   Medication Sig Start Date End Date Taking? Authorizing Provider  acetaminophen (TYLENOL) 500 MG tablet Take 500 mg by mouth every 6 (six) hours as needed. For pain   Yes Historical Provider, MD  amLODipine (NORVASC) 10 MG tablet Take 10 mg by mouth every evening.    Yes Historical Provider, MD  cloNIDine (CATAPRES) 0.1 MG tablet Take 0.1 mg by mouth daily.  08/01/11  Yes [name], MD  isosorbide mononitrate (IMDUR) 30 MG 24 hr tablet Take 30 mg by mouth every evening.   Yes Historical Provider, MD  metoprolol (LOPRESSOR) 50 MG tablet Take 50 mg by mouth at bedtime.  02/22/11 02/22/12 Yes Jessica A Hope, PA-C  nitroGLYCERIN (NITROSTAT) 0.4 MG SL tablet Place 0.4 mg under the tongue every 5 (five) minutes as  needed. For chest pain   Yes Historical Provider, MD  Tamsulosin HCl (FLOMAX) 0.4 MG CAPS Take 0.4 mg by mouth at bedtime. Take 2 tabs daily   Yes Historical Provider, MD  warfarin (COUMADIN) 5 MG tablet Take 5 mg by mouth daily.  07/02/11  Yes [name], MD     Allergies  Allergen Reactions  . Oxycodone Hcl Other (See Comments)    Wanted to climb the wall  . Prednisone Other (See Comments)    Gain weight      Social History:   Retired Investment banker, operational, Lives independently and has been able to perform all of his ADLs.  reports that he quit smoking about 33 years ago. His smoking use included Cigarettes  and Pipe. He has a 80 pack-year smoking history. He has never used smokeless tobacco. He reports that he drinks alcohol. He reports that he does not use illicit drugs.   No family history on file.   Physical Exam:  GEN:  Pleasant cachectic appearing Elderly 78 year old Caucasian Male examined  and in no acute distress; cooperative with exam Filed Vitals:   12/03/11 1640 12/03/11 2146 12/04/11 0130  BP: 143/89 187/101 149/87  Pulse: 84 87 79  Temp: 98.1 F (36.7 C)    TempSrc: Oral    Resp: 16 18 21   SpO2: 94% 100% 93%   Blood pressure 149/87, pulse 79, temperature 98.1 F (36.7 C), temperature source Oral, resp. rate 21, SpO2 93.00%. PSYCH: He is alert and oriented x4; does not appear anxious does not appear depressed; affect is normal HEENT: Normocephalic and Atraumatic, Mucous membranes pink; PERRLA; EOM intact; Fundi:  Benign;  No scleral icterus, Nares: Patent, Oropharynx: Clear, Fair Dentition, Neck:  FROM, no cervical lymphadenopathy nor thyromegaly or carotid bruit; no JVD; Breasts:: Not examined CHEST WALL: No tenderness CHEST: Normal respiration, clear to auscultation bilaterally HEART: Regular rate and rhythm; no murmurs rubs or gallops BACK: No kyphosis or scoliosis; no CVA tenderness ABDOMEN: Positive Bowel Sounds, Scaphoid, soft non-tender; no masses, no organomegaly.   Rectal Exam: Not done EXTREMITIES: No bone or joint deformity; age-appropriate arthropathy of the hands and knees; no cyanosis, clubbing or edema; no ulcerations. Genitalia: not examined PULSES: 2+ and symmetric SKIN: Normal hydration no rash or ulceration CNS: Cranial nerves 2-12 grossly intact no focal neurologic deficit   Labs on Admission:  Basic Metabolic Panel:  Lab 12/03/11 9147 12/03/11 1730 12/03/11 1430  NA -- 135 135  K -- 4.7 4.6  CL -- 101 101  CO2 -- 23 22  GLUCOSE -- 126* 118*  BUN -- 51* 51*  CREATININE -- 3.43* 3.40*  CALCIUM -- >15.0* >15.0*  MG -- -- --  PHOS 6.7* -- 6.5*    Liver Function Tests:  Lab 12/03/11 2353 12/03/11 1430  AST 20 --  ALT 17 --  ALKPHOS 72 --  BILITOT 1.0 --  PROT 7.4 --  ALBUMIN 3.8 3.9   No results found for this basename: LIPASE:5,AMYLASE:5 in the last 168 hours No results found for this basename: AMMONIA:5 in the last 168 hours CBC:  Lab 12/03/11 1730 12/03/11 1405  WBC 3.1* --  NEUTROABS -- --  HGB 11.5* 11.6*  HCT 37.7* --  MCV 95.9 --  PLT 132* --   Cardiac Enzymes: No results found for this basename: CKTOTAL:5,CKMB:5,CKMBINDEX:5,TROPONINI:5 in the last 168 hours  BNP (last 3 results)  Basename 08/04/11 1241 02/16/11 1044  PROBNP 601.0* 3945.0*   CBG: No results found for this basename: GLUCAP:5  in the last 168 hours  Radiological Exams on Admission: Dg Chest 2 View  12/04/2011  *RADIOLOGY REPORT*  Clinical Data: COPD.  CHEST - 2 VIEW  Comparison: 08/04/2011  Findings: New airspace disease is seen in the left lower lobe, consistent with pneumonia.  Changes of COPD again demonstrated. Mild cardiomegaly is stable.  No evidence of congestive heart failure or pleural effusion.  IMPRESSION:  1.  New left lower lobe airspace disease, consistent with pneumonia. 2.  COPD. 3.  Stable mild cardiomegaly.   Original Report Authenticated By: Danae Orleans, M.D.     EKG: Independently reviewed.   Rate Controlled Atrial Fibrillation, No Acute ST Changes  Assessment/Plan Principal Problem:  *Hypercalcemia Active Problems:  Pancytopenia  CKD (chronic kidney disease), stage III  Cachexia  HYPERLIPIDEMIA  Essential hypertension, benign  CAD  Atrial fibrillation  Chronic diastolic heart failure CAP Community Acquired Pneumonia  Plan:    Admit to Stepdown ICU Bed Administered Pamidronate IV, IVFs, and  Lasix.   Monitor Calcium Levels Monitor Bun/Cr levels Reconcile Home Medications Rocephin and Azithromycin for CAP Pharmacy adjustment of Coumadin   Code Status: FULL CODE Family Communication: Family At  Bedside Disposition Plan: Return to Home  Time spent: 60 minutes  Ron Parker [facility] Pager [phone]  If 7PM-7AM, please contact night-coverage [email or web address] Password [email or web address] 12/04/2011, 2:12 AM

## 2011-12-04 NOTE — Consult Note (Signed)
[facility] CONSULT NOTE    Date: 12/04/2011                  Patient Name:  Tracy [name]  MRN: [id number]  DOB: 03-23-1920  Age / Sex: 78 y.o., male         PCP: [name], MD                 Service Requesting Consult: IM - Dr. Betti Cruz                 Reason for Consult: Hypercalcemia            History of Present Illness: Patient is a 78 y.o. male with a PMHx of CAD, CKD, HTN, HLD, Afib, & carotid stenosis, who was admitted to [facility] on 12/03/2011 for evaluation of hypercalcemia. He was called by Dr. Caryn Section and was told to go to the ED for hypercalcemia.  He has had increased confusion (can't remember phone numbers, who he is calling, etc) and weakness over the past week.  Also notes loss of appetite over the past 2 months and losing about 20 pounds. No fevers or chills. No abdominal pain. No worsening of DOE.  Unsteady gait.  He is scheduled for operative treatment of prostatic hypertrophy in October.    Medications: Outpatient medications: Prescriptions prior to admission  Medication Sig Dispense Refill  . acetaminophen (TYLENOL) 500 MG tablet Take 500 mg by mouth every 6 (six) hours as needed. For pain      . amLODipine (NORVASC) 10 MG tablet Take 10 mg by mouth every evening.       . cloNIDine (CATAPRES) 0.1 MG tablet Take 0.1 mg by mouth daily.       . isosorbide mononitrate (IMDUR) 30 MG 24 hr tablet Take 30 mg by mouth every evening.      . metoprolol (LOPRESSOR) 50 MG tablet Take 50 mg by mouth at bedtime.       . nitroGLYCERIN (NITROSTAT) 0.4 MG SL tablet Place 0.4 mg under the tongue every 5 (five) minutes as needed. For chest pain      . Tamsulosin HCl (FLOMAX) 0.4 MG CAPS Take 0.4 mg by mouth at bedtime. Take 2 tabs daily      . warfarin (COUMADIN) 5 MG tablet Take 5 mg by mouth daily.         Current medications: Current Facility-Administered Medications  Medication Dose Route Frequency Provider Last Rate Last Dose  . 0.9 %  sodium chloride infusion    Intravenous Continuous Ron Parker, MD 75 mL/hr at 12/04/11 0439    . acetaminophen (TYLENOL) tablet 650 mg  650 mg Oral Q6H PRN Ron Parker, MD       Or  . acetaminophen (TYLENOL) suppository 650 mg  650 mg Rectal Q6H PRN Ron Parker, MD      . alum & mag hydroxide-simeth (MAALOX/MYLANTA) 200-200-20 MG/5ML suspension 30 mL  30 mL Oral Q6H PRN Ron Parker, MD      . azithromycin (ZITHROMAX) 500 mg in dextrose 5 % 250 mL IVPB  500 mg Intravenous Once April K Palumbo-Rasch, MD   500 mg at 12/04/11 0440  . azithromycin (ZITHROMAX) 500 mg in dextrose 5 % 250 mL IVPB  500 mg Intravenous Q24H Cristal Ford, MD      . cefTRIAXone (ROCEPHIN) 1 g in dextrose 5 % 50 mL IVPB  1 g Intravenous Once April Smitty Cords, MD  1 g at 12/04/11 0439  . cefTRIAXone (ROCEPHIN) 1 g in dextrose 5 % 50 mL IVPB  1 g Intravenous Q24H Cristal Ford, MD   1 g at 12/04/11 1108  . cloNIDine (CATAPRES) tablet 0.1 mg  0.1 mg Oral Daily Ron Parker, MD   0.1 mg at 12/04/11 0910  . furosemide (LASIX) injection 40 mg  40 mg Intravenous Once April K Palumbo-Rasch, MD   40 mg at 12/04/11 0105  . hydrALAZINE (APRESOLINE) injection 10 mg  10 mg Intravenous Q6H PRN Ron Parker, MD   10 mg at 12/04/11 0513  . HYDROmorphone (DILAUDID) injection 0.5-1 mg  0.5-1 mg Intravenous Q3H PRN Ron Parker, MD      . isosorbide mononitrate (IMDUR) 24 hr tablet 30 mg  30 mg Oral QPM Harvette Velora Heckler, MD      . metoprolol (LOPRESSOR) tablet 50 mg  50 mg Oral QHS Harvette Velora Heckler, MD      . nitroGLYCERIN (NITROSTAT) SL tablet 0.4 mg  0.4 mg Sublingual Q5 min PRN Ron Parker, MD      . ondansetron (ZOFRAN) tablet 4 mg  4 mg Oral Q6H PRN Ron Parker, MD       Or  . ondansetron (ZOFRAN) injection 4 mg  4 mg Intravenous Q6H PRN Ron Parker, MD      . pamidronate (AREDIA) 90 mg in sodium chloride 0.9 % 500 mL IVPB  90 mg Intravenous Once April K Palumbo-Rasch, MD   90 mg at  12/04/11 0108  . sodium chloride 0.9 % bolus 1,000 mL  1,000 mL Intravenous Once April K Palumbo-Rasch, MD   1,000 mL at 12/04/11 0025  . Tamsulosin HCl (FLOMAX) capsule 0.4 mg  0.4 mg Oral QHS Harvette C Jenkins, MD      . warfarin (COUMADIN) tablet 1 mg  1 mg Oral ONCE-1800 Ron Parker, MD      . Warfarin - Pharmacist Dosing Inpatient   Does not apply [id number] Ron Parker, MD      . zolpidem (AMBIEN) tablet 5 mg  5 mg Oral QHS PRN Ron Parker, MD      . DISCONTD: oxyCODONE (Oxy IR/ROXICODONE) immediate release tablet 5 mg  5 mg Oral Q4H PRN Ron Parker, MD      . DISCONTD: warfarin (COUMADIN) tablet 5 mg  5 mg Oral Daily Ron Parker, MD       Facility-Administered Medications Ordered in Other Encounters  Medication Dose Route Frequency Provider Last Rate Last Dose  . DISCONTD: darbepoetin (ARANESP) injection 150 mcg  150 mcg Subcutaneous Q7 days Zada Girt, MD          Allergies: Allergies  Allergen Reactions  . Oxycodone Hcl Other (See Comments)    Wanted to climb the wall  . Prednisone Other (See Comments)    Gain weight       Past Medical History: Past Medical History  Diagnosis Date  . CAD (coronary artery disease)     status post prior PCI to the obtuse marginal in 1997 and stenting to the mid to distal RCA in 2001; LHC 9/06:  LM ok, pLAD 50%, mLAD 60% then 60-70%, mRI 50%, mOM1 80-90% (small), pRCA and dRCA stents ok, PDA 50%, EF 65%;  Myoview 5/12: No ischemia, EF 64%.;  NSTEMI 11/12 tx medically   . Chronic kidney disease, stage III (moderate)   . TIA (transient ischemic attack)   . HTN (hypertension)   .  Hyperlipidemia   . AF (atrial fibrillation) 02/16/11    Coumadin  . Arthritis     "in my left ankle"  . Chronic diastolic heart failure   . Anemia of chronic disease   . Carotid stenosis     dopplers 03/2011: 0-39% bilateral     Past Surgical History: Past Surgical History  Procedure Date  . Nose surgery 1985    deviated  septum  . Total knee arthroplasty 2009    left  . Joint replacement 2009    left knee  . Appendectomy 1930's  . Tonsillectomy and adenoidectomy 1930's  . Coronary angioplasty with stent placement     3 procedures; 3 stents total  . Cataract extraction w/ intraocular lens  implant, bilateral   . Surgery scrotal / testicular 1971    "testicle crushed; removed"     Family History: No family history on file.   Social History: History   Social History  . Marital Status: Married    Spouse Name: N/A    Number of Children: N/A  . Years of Education: N/A   Occupational History  .     Social History Main Topics  . Smoking status: Former Smoker -- 2.0 packs/day for 40 years    Types: Cigarettes, Pipe    Quit date: 08/24/1978  . Smokeless tobacco: Never Used   Comment: 1980 quit smoking cigarrettes and pipe  . Alcohol Use: Yes     "glass of wine or beer once in awhile"  . Drug Use: No  . Sexually Active: Yes   Other Topics Concern  . Not on file   Social History Narrative  . No narrative on file     Review of Systems: As per HPI  Vital Signs: Blood pressure 153/97, pulse 96, temperature 98.7 F (37.1 C), temperature source Oral, resp. rate 21, height 6\' 1"  (1.854 m), weight 170 lb 13.7 oz (77.5 kg), SpO2 93.00%.  Weight trends: Filed Weights   12/04/11 0300  Weight: 170 lb 13.7 oz (77.5 kg)    Physical Exam: General: Vital signs reviewed and noted. Well-developed, well-nourished, in no acute distress; alert, appropriate and cooperative throughout examination.  Head: Normocephalic, atraumatic.  Eyes: PERRL, EOMI, No signs of anemia or jaundince.  Nose: Mucous membranes moist, not inflammed, nonerythematous.  Throat: Oropharynx nonerythematous, no exudate appreciated.   Neck: No deformities, masses, or tenderness noted.Supple, No carotid Bruits, no JVD.  Lungs:  Normal respiratory effort. LLL coarse breath sounds.  Heart: RRR. S1 and S2 normal without gallop,  murmur, or rubs.  Abdomen:  BS normoactive. Soft, Nondistended, non-tender.  No masses or organomegaly.  Extremities: No pretibial edema.  Neurologic: A&O X3, CN II - XII are grossly intact. Motor strength is 5/5 in the all 4 extremities, Sensations intact to light touch  Skin: No visible rashes, scars.    Lab results: Basic Metabolic Panel:  Lab 12/04/11 9604 12/03/11 2353 12/03/11 1730 12/03/11 1430  NA 136 -- 135 135  K 4.0 -- 4.7 4.6  CL 101 -- 101 101  CO2 23 -- 23 22  GLUCOSE 97 -- 126* 118*  BUN 48* -- 51* 51*  CREATININE 3.31* -- 3.43* 3.40*  CALCIUM 14.3* -- >15.0* >15.0*  MG -- -- -- --  PHOS -- 6.7* -- 6.5*    Liver Function Tests:  Lab 12/03/11 2353 12/03/11 1430  AST 20 --  ALT 17 --  ALKPHOS 72 --  BILITOT 1.0 --  PROT 7.4 --  ALBUMIN 3.8 3.9  No results found for this basename: LIPASE:3,AMYLASE:3 in the last 168 hours No results found for this basename: AMMONIA:3 in the last 168 hours  CBC:  Lab 12/04/11 0655 12/03/11 1730 12/03/11 1405  WBC 5.9 3.1* --  NEUTROABS -- -- --  HGB 11.8* 11.5* 11.6*  HCT 38.2* 37.7* --  MCV 95.7 95.9 --  PLT 130* 132* --    Cardiac Enzymes: No results found for this basename: CKTOTAL:5,CKMB:5,CKMBINDEX:5,TROPONINI:5 in the last 168 hours  BNP: No components found with this basename: POCBNP:3  CBG: No results found for this basename: GLUCAP:5 in the last 168 hours  Microbiology: Results for orders placed during the hospital encounter of 12/03/11  MRSA PCR SCREENING     Status: Normal   Collection Time   12/04/11  3:00 AM      Component Value Range Status Comment   MRSA by PCR NEGATIVE  NEGATIVE Final     Coagulation Studies:  Basename 12/04/11 0019 24-Dec-2011 1224  LABPROT 32.3* --  INR 3.08* 2.7    Urinalysis:  Basename 12/04/11 0122  COLORURINE YELLOW  LABSPEC 1.016  PHURINE 5.5  GLUCOSEU NEGATIVE  HGBUR SMALL*  BILIRUBINUR NEGATIVE  KETONESUR NEGATIVE  PROTEINUR 30*  UROBILINOGEN 0.2    NITRITE NEGATIVE  LEUKOCYTESUR MODERATE*      Imaging: Dg Chest 2 View  12/04/2011  *RADIOLOGY REPORT*  Clinical Data: COPD.  CHEST - 2 VIEW  Comparison: 08/04/2011  Findings: New airspace disease is seen in the left lower lobe, consistent with pneumonia.  Changes of COPD again demonstrated. Mild cardiomegaly is stable.  No evidence of congestive heart failure or pleural effusion.  IMPRESSION:  1.  New left lower lobe airspace disease, consistent with pneumonia. 2.  COPD. 3.  Stable mild cardiomegaly.   Original Report Authenticated By: Danae Orleans, M.D.       Assessment & Plan: Patient is a 78 y.o. male with a PMHx of CAD, CKD, HTN, HLD, Afib, pulm fibrosis & carotid stenosis, who was admitted to [facility] on 12/03/2011 for evaluation of hypercalcemia.  #Hypercalcemia: acute, in the past, Ca has been as high as 11.7 (09/16/11), but usually between 8-9.  Most recent Ca prior to this hospitalization was 10.9 on 11/25/11.  Suspicious for malignancy, will pursue workup per primary.  CXR reveals new LLL airspace disease.  Patient had CT chest on 08/27/11 that revealed several b/l subpleural nodules, with the largest in the posterior right lung base -2cm.  Patient supposed to f/u with chest CT 4-8 months from that image (followed by Dr. Vassie Loll).  Not on any medications such as thiazides, lithium, tums/calcium or Vit A that may be contributing.  Has diet high in milk, but no recent change (1/2 gallon milk/day). TSH wnl on 08/04/11.  Low suspicion for adrenal insufficiency. Serum protein on electrophoresis was wnl on 04/07/11.  Given lasix 40 IV x1 & 1 NS bolus.  Also given 1 dose pamidronate 90mg  IV.  Not certain cause of hypercalcemia; drinks a lot of milk, but no heavy antacid use.  Very high calcium levels usually are usually an indication of malignancy w bony mets, which is the primary concern.  He did have some pulm nodules on CT of chest in May 2013.  CT abd in July and CT chest from May reviewed with radiologist  and she did not see any evidence of bony mets in these studies.  For now recommend ^lasix and increase NS to 100/hr. Start calcintonin. Check PTH related peptide. With weight loss  and hyperCa++, myeloma is a consideration also, and SPEP/UPEP have been ordered. Consider bone scan.  Will follow.   -Increase gentle IVF hydration to 100cc/h with strict in/outs in setting of h/o diastolic HF (most recent echo with normal EF, no comment on diastolic fxn - currently +711 since admission) -Lasix 40mg  IV BID -CMET with AML -Calcitonin 300units (4units/kg) IM BID x 4 doses, would avoid pamidronate in setting of renal disease -Agree with UPEP & SPEP, iPTH, Vit D levels -check PTHrp (serum iPTH was low (4.4) four weeks ago) -UA suggestive of UTI (moderate leukocytes, small Hb, 30 protein) and micro with many bacteria - likely related to prostatic hypertrophy; patient being treated with Ceftriaxone for CAP, which should cover UTI as well - though past culture in 01/2011 grew staph, so if becomes symptomatic or worsening renal failure, culture and change abx - was sensitive to levofloxacin  #CKD Stage 4: seems to be near baseline; continue to monitor  #CAP: per primary - stable, azithromax & ceftriaxone per primary  #HTN: per primary, clonidine, imdur, metoprolol  #AFib: per primary; rate controlled, anticoagulated with coumadin   #Anemia & thrombocytopenia d/t myelodysplastic syndrome: Per primary - Had bone marrow biopsy in March of 2013, myelodysplastic syndrome, Dr. Shirline Frees. Patient chose conservative approach. Hemoglobin and platelet count stable.  DVT PPX - coumadin - therapuetic inr  Patient seen and examined and agree with assessment and plan as above with additions as indicated.  [name]  MD [facility] [phone] pgr    [phone] cell 12/04/2011, 5:31 PM

## 2011-12-04 NOTE — Evaluation (Signed)
Physical Therapy Evaluation Patient Details Name: Tracy [name] MRN: [id number] DOB: September 11, 1919 Today's Date: 12/04/2011 Time: 0272-5366 PT Time Calculation (min): 26 min  PT Assessment / Plan / Recommendation Clinical Impression  Pt admitted with hypercalcemia and demonstrates impulsivity and decreased cognitive awareness as well as decreased mobility and safety. Pt will benefit from acute therapy to maximize mobility, gait, transfers and safety prior to discharge with wife.    PT Assessment  Patient needs continued PT services    Follow Up Recommendations  Home health PT    Barriers to Discharge None      Equipment Recommendations  Rolling walker with 5" wheels    Recommendations for Other Services     Frequency Min 3X/week    Precautions / Restrictions Precautions Precautions: Fall   Pertinent Vitals/Pain HR 80-90 with activity sats 95% on RA      Mobility  Bed Mobility Bed Mobility: Supine to Sit Supine to Sit: 5: Supervision;With rails Details for Bed Mobility Assistance: cueing for sequence and safety as pt very preoccupied by lines Transfers Transfers: Sit to Stand;Stand to Sit Sit to Stand: 4: Min guard;From bed Stand to Sit: 4: Min guard;To bed;To chair/3-in-1 Details for Transfer Assistance: cueing for hand placement, safety and sequence, Pt impulsive and stepping away from RW at times and reaching for things past RW outside base of support unsafely Ambulation/Gait Ambulation/Gait Assistance: 4: Min assist Ambulation Distance (Feet): 400 Feet Assistive device: Rolling walker Ambulation/Gait Assistance Details: constant cueing to step into RW and avoid obstacles to left, pt states he was aware of obstacles but not moving in time and ran into wall or door frame at least 5 times Gait Pattern: Step-through pattern;Trunk flexed;Decreased stride length Stairs: No    Exercises     PT Diagnosis: Difficulty walking;Altered mental status  PT Problem List: Decreased  cognition;Decreased activity tolerance;Decreased balance;Decreased mobility;Decreased safety awareness;Decreased knowledge of use of DME PT Treatment Interventions: Gait training;DME instruction;Stair training;Functional mobility training;Therapeutic activities;Therapeutic exercise;Patient/family education;Cognitive remediation   PT Goals Acute Rehab PT Goals PT Goal Formulation: With patient/family Time For Goal Achievement: 12/11/11 Potential to Achieve Goals: Good Pt will go Supine/Side to Sit: with modified independence PT Goal: Supine/Side to Sit - Progress: Goal set today Pt will go Sit to Stand: with modified independence PT Goal: Sit to Stand - Progress: Goal set today Pt will go Stand to Sit: with modified independence PT Goal: Stand to Sit - Progress: Goal set today Pt will Ambulate: >150 feet;with supervision;with least restrictive assistive device PT Goal: Ambulate - Progress: Goal set today Pt will Go Up / Down Stairs: Flight;with supervision;with rail(s) PT Goal: Up/Down Stairs - Progress: Goal set today  Visit Information  Last PT Received On: 12/04/11 Assistance Needed: +1    Subjective Data  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 Patient Stated Goal: return home   Prior Functioning  Home Living Lives With: Spouse Available Help at Discharge: Family;Available 24 hours/day Type of Home: Apartment (2 story townhouse) Home Access: Stairs to enter [employer] of Steps: 2 Home Layout: Two level;Bed/bath upstairs Alternate Level Stairs-Number of Steps: 13 Bathroom Shower/Tub: Engineer, manufacturing systems: Standard Home Adaptive Equipment: Straight cane Prior Function Level of Independence: Independent Able to Take Stairs?: Yes Driving: Yes Vocation: Retired Comments: Spouse states pt has had increasing weakness and stumbling over the last 2wks and he has started using the cane because of it Communication Communication: No difficulties    Cognition   Overall Cognitive Status: Impaired Area of Impairment:  Attention Arousal/Alertness: Awake/alert Orientation Level: Appears intact for tasks assessed Behavior During Session: Restless Current Attention Level: Sustained Cognition - Other Comments: Pt impulsive and constant redirection to stop fiddling with lines    Extremity/Trunk Assessment Right Upper Extremity Assessment RUE ROM/Strength/Tone: [facility] for tasks assessed Left Upper Extremity Assessment LUE ROM/Strength/Tone: [facility] for tasks assessed Right Lower Extremity Assessment RLE ROM/Strength/Tone: [facility] for tasks assessed Left Lower Extremity Assessment LLE ROM/Strength/Tone: WFL for tasks assessed Trunk Assessment Trunk Assessment: Normal   Balance Static Sitting Balance Static Sitting - Balance Support: No upper extremity supported;Feet supported Static Sitting - Level of Assistance: 5: Stand by assistance Static Sitting - Comment/# of Minutes: 3  End of Session PT - End of Session Equipment Utilized During Treatment: Gait belt Activity Tolerance: Patient tolerated treatment well Patient left: in chair;with call bell/phone within reach;with family/visitor present Nurse Communication: Mobility status  GP     Toney Sang Beth 12/04/2011, 3:04 PM  Delaney Meigs, PT [phone]

## 2011-12-04 NOTE — ED Notes (Signed)
Family at bedside. 

## 2011-12-04 NOTE — ED Notes (Signed)
Patient transported to X-ray 

## 2011-12-04 NOTE — ED Provider Notes (Signed)
History     CSN: [id number]  Arrival date & time 12/03/11  1640   First MD Initiated Contact with Patient 12/03/11 2304      Chief Complaint  Patient presents with  . Abnormal Lab    (Consider location/radiation/quality/duration/timing/severity/associated sxs/prior treatment) Patient is a 78 y.o. male presenting with musculoskeletal pain. The history is provided by the patient. No language interpreter was used.  Muscle Pain This is a new problem. The current episode started yesterday. The problem occurs constantly. The problem has not changed since onset.Pertinent negatives include no chest pain, no abdominal pain, no headaches and no shortness of breath. Nothing aggravates the symptoms. Nothing relieves the symptoms. He has tried nothing for the symptoms. The treatment provided no relief.  Had routine check of blood at short stay and had aches and pains and had normal Xrays and called and told calcium > 15 and to come to the ED.  Wife says speech is slurred as well.    Past Medical History  Diagnosis Date  . CAD (coronary artery disease)     status post prior PCI to the obtuse marginal in 1997 and stenting to the mid to distal RCA in 2001; LHC 9/06:  LM ok, pLAD 50%, mLAD 60% then 60-70%, mRI 50%, mOM1 80-90% (small), pRCA and dRCA stents ok, PDA 50%, EF 65%;  Myoview 5/12: No ischemia, EF 64%.;  NSTEMI 11/12 tx medically   . Chronic kidney disease, stage III (moderate)   . TIA (transient ischemic attack)   . HTN (hypertension)   . Hyperlipidemia   . AF (atrial fibrillation) 02/16/11    Coumadin  . Arthritis     "in my left ankle"  . Chronic diastolic heart failure   . Anemia of chronic disease   . Carotid stenosis     dopplers 03/2011: 0-39% bilateral    Past Surgical History  Procedure Date  . Nose surgery 1985    deviated septum  . Total knee arthroplasty 2009    left  . Joint replacement 2009    left knee  . Appendectomy 1930's  . Tonsillectomy and adenoidectomy  1930's  . Coronary angioplasty with stent placement     3 procedures; 3 stents total  . Cataract extraction w/ intraocular lens  implant, bilateral   . Surgery scrotal / testicular 1971    "testicle crushed; removed"    No family history on file.  History  Substance Use Topics  . Smoking status: Former Smoker -- 2.0 packs/day for 40 years    Types: Cigarettes, Pipe    Quit date: 08/24/1978  . Smokeless tobacco: Never Used   Comment: 1980 quit smoking cigarrettes and pipe  . Alcohol Use: Yes     "glass of wine or beer once in awhile"      Review of Systems  Constitutional: Negative for fever.  HENT: Negative for neck pain.   Respiratory: Negative for shortness of breath.   Cardiovascular: Negative for chest pain.  Gastrointestinal: Negative for abdominal pain and abdominal distention.  Musculoskeletal: Positive for myalgias and arthralgias.  Neurological: Negative for headaches.  All other systems reviewed and are negative.    Allergies  Oxycodone hcl and Prednisone  Home Medications   Current Outpatient Rx  Name Route Sig Dispense Refill  . ACETAMINOPHEN 500 MG PO TABS Oral Take 500 mg by mouth every 6 (six) hours as needed. For pain    . AMLODIPINE BESYLATE 10 MG PO TABS Oral Take 10 mg by mouth every  evening.     Marland Kitchen CLONIDINE HCL 0.1 MG PO TABS Oral Take 0.1 mg by mouth daily.     . ISOSORBIDE MONONITRATE ER 30 MG PO TB24 Oral Take 30 mg by mouth every evening.    Marland Kitchen METOPROLOL TARTRATE 50 MG PO TABS Oral Take 50 mg by mouth at bedtime.     Marland Kitchen NITROGLYCERIN 0.4 MG SL SUBL Sublingual Place 0.4 mg under the tongue every 5 (five) minutes as needed. For chest pain    . TAMSULOSIN HCL 0.4 MG PO CAPS Oral Take 0.4 mg by mouth at bedtime. Take 2 tabs daily    . WARFARIN SODIUM 5 MG PO TABS Oral Take 5 mg by mouth daily.       BP 187/101  Pulse 87  Temp 98.1 F (36.7 C) (Oral)  Resp 18  SpO2 100%  Physical Exam  Constitutional: He is oriented to person, place, and  time. He appears well-developed and well-nourished. No distress.  HENT:  Head: Normocephalic and atraumatic.  Mouth/Throat: Oropharynx is clear and moist.  Eyes: Conjunctivae are normal. Pupils are equal, round, and reactive to light.  Neck: Normal range of motion. Neck supple.  Cardiovascular: Normal rate and regular rhythm.   Pulmonary/Chest: Effort normal and breath sounds normal. He has no wheezes. He has no rales.  Abdominal: Soft. Bowel sounds are normal. There is no tenderness. There is no rebound and no guarding.  Musculoskeletal: Normal range of motion. He exhibits no tenderness.  Neurological: He is alert and oriented to person, place, and time. He has normal reflexes.  Skin: Skin is warm.  Psychiatric: He has a normal mood and affect.    ED Course  Procedures (including critical care time)  Labs Reviewed  CBC - Abnormal; Notable for the following:    WBC 3.1 (*)     RBC 3.93 (*)     Hemoglobin 11.5 (*)     HCT 37.7 (*)     Platelets 132 (*)     All other components within normal limits  BASIC METABOLIC PANEL - Abnormal; Notable for the following:    Glucose, Bld 126 (*)     BUN 51 (*)     Creatinine, Ser 3.43 (*)     Calcium >15.0 (*)     GFR calc non Af Amer 14 (*)     GFR calc Af Amer 17 (*)     All other components within normal limits  HEPATIC FUNCTION PANEL  URINALYSIS, ROUTINE W REFLEX MICROSCOPIC  PHOSPHORUS   No results found.   1. Hypercalcemia       MDM   Date: 12/04/2011  Rate: 81  Rhythm: atrial fibrillation  QRS Axis: left  Intervals: normal  ST/T Wave abnormalities: nonspecific ST changes  Conduction Disutrbances:none  Narrative Interpretation:   Old EKG Reviewed: none available   CRITICAL CARE Performed by: Jasmine Awe   Total critical care time: 60 minutes   Critical care time was exclusive of separately billable procedures and treating other patients.  Critical care was necessary to treat or prevent imminent or  life-threatening deterioration.  Critical care was time spent personally by me on the following activities: development of treatment plan with patient and/or surrogate as well as nursing, discussions with consultants, evaluation of patient's response to treatment, examination of patient, obtaining history from patient or surrogate, ordering and performing treatments and interventions, ordering and review of laboratory studies, ordering and review of radiographic studies, pulse oximetry and re-evaluation of patient's condition.  MDM Reviewed: nursing note and vitals Reviewed previous: labs Interpretation: labs, ECG and x-ray Total time providing critical care: 30-74 minutes. This excludes time spent performing separately reportable procedures and services. Consults: admitting MD  Treatment of life threatening calcium level    Jaqueline Uber K Parilee Hally-Rasch, MD 12/04/11 [phone]

## 2011-12-05 DIAGNOSIS — I10 Essential (primary) hypertension: Secondary | ICD-10-CM

## 2011-12-05 DIAGNOSIS — D61818 Other pancytopenia: Secondary | ICD-10-CM

## 2011-12-05 LAB — CBC
HCT: 34.2 % — ABNORMAL LOW (ref 39.0–52.0)
Hemoglobin: 10.6 g/dL — ABNORMAL LOW (ref 13.0–17.0)
MCH: 29.5 pg (ref 26.0–34.0)
MCHC: 31 g/dL (ref 30.0–36.0)
MCV: 95.3 fL (ref 78.0–100.0)
RBC: 3.59 MIL/uL — ABNORMAL LOW (ref 4.22–5.81)

## 2011-12-05 LAB — COMPREHENSIVE METABOLIC PANEL
ALT: 17 U/L (ref 0–53)
AST: 25 U/L (ref 0–37)
Albumin: 3.6 g/dL (ref 3.5–5.2)
Calcium: 11.8 mg/dL — ABNORMAL HIGH (ref 8.4–10.5)
Chloride: 102 mEq/L (ref 96–112)
Creatinine, Ser: 3.12 mg/dL — ABNORMAL HIGH (ref 0.50–1.35)
GFR calc non Af Amer: 16 mL/min — ABNORMAL LOW (ref >90)
Glucose, Bld: 112 mg/dL — ABNORMAL HIGH (ref 70–99)
Potassium: 4.2 mEq/L (ref 3.5–5.1)
Total Protein: 7 g/dL (ref 6.0–8.3)

## 2011-12-05 LAB — LEGIONELLA ANTIGEN, URINE

## 2011-12-05 LAB — STREP PNEUMONIAE URINARY ANTIGEN: Strep Pneumo Urinary Antigen: NEGATIVE

## 2011-12-05 MED ORDER — FUROSEMIDE 40 MG PO TABS
40.0000 mg | ORAL_TABLET | Freq: Two times a day (BID) | ORAL | Status: DC
  Administered 2011-12-05 – 2011-12-08 (×6): 40 mg via ORAL
  Filled 2011-12-05 (×12): qty 1

## 2011-12-05 MED ORDER — CEFUROXIME AXETIL 500 MG PO TABS
500.0000 mg | ORAL_TABLET | Freq: Two times a day (BID) | ORAL | Status: DC
  Administered 2011-12-05 – 2011-12-10 (×10): 500 mg via ORAL
  Filled 2011-12-05 (×16): qty 1

## 2011-12-05 MED ORDER — WARFARIN SODIUM 5 MG PO TABS
5.0000 mg | ORAL_TABLET | Freq: Once | ORAL | Status: AC
  Administered 2011-12-05: 5 mg via ORAL
  Filled 2011-12-05 (×2): qty 1

## 2011-12-05 MED ORDER — AZITHROMYCIN 250 MG PO TABS
250.0000 mg | ORAL_TABLET | Freq: Every day | ORAL | Status: AC
  Administered 2011-12-05 – 2011-12-10 (×6): 250 mg via ORAL
  Filled 2011-12-05 (×9): qty 1

## 2011-12-05 MED ORDER — TAMSULOSIN HCL 0.4 MG PO CAPS
0.8000 mg | ORAL_CAPSULE | Freq: Every day | ORAL | Status: DC
  Administered 2011-12-05 – 2011-12-09 (×5): 0.8 mg via ORAL
  Filled 2011-12-05 (×8): qty 2

## 2011-12-05 NOTE — Progress Notes (Signed)
ANTICOAGULATION CONSULT NOTE - Initial Consult  Pharmacy Consult for Coumadin Indication: atrial fibrillation  Labs:  Basename 12/05/11 0515 12/04/11 0655 12/04/11 0019 12/03/11 1730  HGB 10.6* 11.8* -- --  HCT 34.2* 38.2* -- 37.7*  PLT 122* 130* -- 132*  APTT -- -- -- --  LABPROT 28.6* -- 32.3* --  INR 2.64* -- 3.08* --  HEPARINUNFRC -- -- -- --  CREATININE 3.12* 3.31* -- 3.43*  CKTOTAL -- -- -- --  CKMB -- -- -- --  TROPONINI -- -- -- --    Estimated Creatinine Clearance: 17.5 ml/min (by C-G formula based on Cr of 3.12).  Assessment: 78 yo male admitted with hypercalcemia. Pharmacy consulted to manage Coumadin for atrial fibrillation. INR now down to 2.6. Per 9/4 anticoagulation note, home Coumadin regimen to be 5mg  daily, except 2.5mg  on Friday and Monday.   Goal of Therapy:  INR 2-3 Monitor platelets by anticoagulation protocol: Yes   Plan:  1. Coumadin 5mg  po today. 2. Daily PT / INR  [name] 12/05/2011,8:29 AM

## 2011-12-05 NOTE — Progress Notes (Signed)
Subjective: Feeling good, Ca down to 11.8  Objective Vital signs in last 24 hours: Filed Vitals:   12/04/11 2007 12/04/11 2350 12/05/11 0400 12/05/11 0743  BP: 118/104 134/78 140/90 142/92  Pulse: 98 88 91   Temp: 97.7 F (36.5 C) 97.5 F (36.4 C) 97.8 F (36.6 C) 97.9 F (36.6 C)  TempSrc: Oral Oral Oral Oral  Resp: 17 19 19 17   Height:      Weight:   78.4 kg (172 lb 13.5 oz)   SpO2: 91% 91% 98%    Weight change: 0.9 kg (1 lb 15.8 oz)  Intake/Output Summary (Last 24 hours) at 12/05/11 0852 Last data filed at 12/05/11 0700  Gross per 24 hour  Intake   1802 ml  Output   1800 ml  Net      2 ml   Labs: Basic Metabolic Panel:  Lab 12/05/11 4098 12/04/11 0655 12/03/11 2353 12/03/11 1730 12/03/11 1430  NA 135 136 -- 135 135  K 4.2 4.0 -- 4.7 4.6  CL 102 101 -- 101 101  CO2 21 23 -- 23 22  GLUCOSE 112* 97 -- 126* 118*  BUN 45* 48* -- 51* 51*  CREATININE 3.12* 3.31* -- 3.43* 3.40*  ALB -- -- -- -- --  CALCIUM 11.8* 14.3* -- >15.0* >15.0*  PHOS -- -- 6.7* -- 6.5*   Liver Function Tests:  Lab 12/05/11 0515 12/03/11 2353 12/03/11 1430  AST 25 20 --  ALT 17 17 --  ALKPHOS 60 72 --  BILITOT 0.8 1.0 --  PROT 7.0 7.4 --  ALBUMIN 3.6 3.8 3.9   No results found for this basename: LIPASE:3,AMYLASE:3 in the last 168 hours No results found for this basename: AMMONIA:3 in the last 168 hours CBC:  Lab 12/05/11 0515 12/04/11 0655 12/03/11 1730 12/03/11 1405  WBC 4.9 5.9 3.1* --  NEUTROABS -- -- -- --  HGB 10.6* 11.8* 11.5* 11.6*  HCT 34.2* 38.2* 37.7* --  MCV 95.3 95.7 95.9 --  PLT 122* 130* 132* --   PT/INR: @labrcntip (inr:5) Cardiac Enzymes: No results found for this basename: CKTOTAL:5,CKMB:5,CKMBINDEX:5,TROPONINI:5 in the last 168 hours CBG: No results found for this basename: GLUCAP:5 in the last 168 hours  Iron Studies:  Lab 12/03/11 1430  IRON 165*  TIBC 274  TRANSFERRIN --  FERRITIN 87    Physical Exam:  Blood pressure 142/92, pulse 91, temperature  97.9 F (36.6 C), temperature source Oral, resp. rate 17, height 6\' 1"  (1.854 m), weight 78.4 kg (172 lb 13.5 oz), SpO2 98.00%.  Gen- alert, no distress up in chair Neck- no jvd Chest- clear bilat Cor- reg no rub Abd- soft, no ascites Ext- no edema Neuro- alert, Ox 3, nonfocal  Assessment/Plan 1 Hypercalcemia- d/w oncology on call. Hypercalcemia of malignancy (i.e.bony mets or myeloma) would be grossly apparent on CT studies (abd in July and chest in May). They said they would see him if needed for this issue- would recommend outpt f/u in with his oncologist after discharge. Could have vit D toxicity, levels pending. PTH from Aug 9 is suppressed. Recommend continue calcitonin, NS and change lasix to po. SPEP and UPEP are pending.  Curtail milk consumption. Would keep in hospital until calcium lower. D/W Dr. Betti Cruz.  2 Pancytopenia, s/p BM bx  3 BPH, due for TURP in Oct 4 Afib 5 HTN 6 CAD  [name]  MD [facility] [phone] pgr    [phone] cell 12/05/2011, 8:52 AM

## 2011-12-05 NOTE — Progress Notes (Signed)
Call placed to Dr. Betti Cruz informed patient had two loose stool, since transfer.  New order received.

## 2011-12-05 NOTE — Progress Notes (Signed)
Call placed to Dr. Betti Cruz, informed patient bladder is distended and bladder scan showed > 950. Patient is voiding 50-100 cc every thirty minutes. New order received.

## 2011-12-05 NOTE — Progress Notes (Signed)
Subjective: Feeling well denies any pain. No specific complaints.  Objective: Vital signs in last 24 hours: Filed Vitals:   12/04/11 2007 12/04/11 2350 12/05/11 0400 12/05/11 0743  BP: 118/104 134/78 140/90 142/92  Pulse: 98 88 91   Temp: 97.7 F (36.5 C) 97.5 F (36.4 C) 97.8 F (36.6 C) 97.9 F (36.6 C)  TempSrc: Oral Oral Oral Oral  Resp: 17 19 19 17   Height:      Weight:   78.4 kg (172 lb 13.5 oz)   SpO2: 91% 91% 98%    Weight change: 0.9 kg (1 lb 15.8 oz)  Intake/Output Summary (Last 24 hours) at 12/05/11 0907 Last data filed at 12/05/11 0700  Gross per 24 hour  Intake   1802 ml  Output   1600 ml  Net    202 ml    Physical Exam: General: Awake, Oriented, No acute distress. HEENT: EOMI. Neck: Supple CV: S1 and S2 Lungs: Clear to ascultation bilaterally Abdomen: Soft, Nontender, Nondistended, +bowel sounds. Ext: Good pulses. Trace edema.  Lab Results: Basic Metabolic Panel:  Lab 12/05/11 2130 12/04/11 0655 12/03/11 2353 12/03/11 1730 12/03/11 1430  NA 135 136 -- 135 135  K 4.2 4.0 -- 4.7 4.6  CL 102 101 -- 101 101  CO2 21 23 -- 23 22  GLUCOSE 112* 97 -- 126* 118*  BUN 45* 48* -- 51* 51*  CREATININE 3.12* 3.31* -- 3.43* 3.40*  CALCIUM 11.8* 14.3* -- >15.0* >15.0*  MG -- -- -- -- --  PHOS -- -- 6.7* -- 6.5*   Liver Function Tests:  Lab 12/05/11 0515 12/03/11 2353 12/03/11 1430  AST 25 20 --  ALT 17 17 --  ALKPHOS 60 72 --  BILITOT 0.8 1.0 --  PROT 7.0 7.4 --  ALBUMIN 3.6 3.8 3.9   No results found for this basename: LIPASE:5,AMYLASE:5 in the last 168 hours No results found for this basename: AMMONIA:5 in the last 168 hours CBC:  Lab 12/05/11 0515 12/04/11 0655 12/03/11 1730 12/03/11 1405  WBC 4.9 5.9 3.1* --  NEUTROABS -- -- -- --  HGB 10.6* 11.8* 11.5* 11.6*  HCT 34.2* 38.2* 37.7* --  MCV 95.3 95.7 95.9 --  PLT 122* 130* 132* --   Cardiac Enzymes: No results found for this basename: CKTOTAL:5,CKMB:5,CKMBINDEX:5,TROPONINI:5 in the last 168  hours BNP (last 3 results)  Basename 08/04/11 1241 02/16/11 1044  PROBNP 601.0* 3945.0*   CBG: No results found for this basename: GLUCAP:5 in the last 168 hours No results found for this basename: HGBA1C:5 in the last 72 hours Other Labs: No components found with this basename: POCBNP:3 No results found for this basename: DDIMER:2 in the last 168 hours No results found for this basename: CHOL:2,HDL:2,LDLCALC:2,TRIG:2,CHOLHDL:2,LDLDIRECT:2 in the last 168 hours No results found for this basename: TSH,T4TOTAL,FREET3,T3FREE,FREET4,THYROIDAB in the last 168 hours  Lab 12/03/11 1430  VITAMINB12 --  FOLATE --  FERRITIN 87  TIBC 274  IRON 165*  RETICCTPCT --    Micro Results: Recent Results (from the past 240 hour(s))  MRSA PCR SCREENING     Status: Normal   Collection Time   12/04/11  3:00 AM      Component Value Range Status Comment   MRSA by PCR NEGATIVE  NEGATIVE Final     Studies/Results: Dg Chest 2 View  12/04/2011  *RADIOLOGY REPORT*  Clinical Data: COPD.  CHEST - 2 VIEW  Comparison: 08/04/2011  Findings: New airspace disease is seen in the left lower lobe, consistent with pneumonia.  Changes of COPD again demonstrated. Mild cardiomegaly is stable.  No evidence of congestive heart failure or pleural effusion.  IMPRESSION:  1.  New left lower lobe airspace disease, consistent with pneumonia. 2.  COPD. 3.  Stable mild cardiomegaly.   Original Report Authenticated By: Danae Orleans, M.D.     Medications: I have reviewed the patient's current medications. Scheduled Meds:    . azithromycin  250 mg Oral Daily  . calcitonin  300 Units Intramuscular BID  . cefUROXime  500 mg Oral BID WC  . cloNIDine  0.1 mg Oral Daily  . furosemide  40 mg Oral BID  . isosorbide mononitrate  30 mg Oral QPM  . metoprolol  50 mg Oral QHS  . Tamsulosin HCl  0.8 mg Oral QHS  . warfarin  1 mg Oral ONCE-1800  . warfarin  5 mg Oral ONCE-1800  . Warfarin - Pharmacist Dosing Inpatient   Does not apply  q1800  . DISCONTD: azithromycin  500 mg Intravenous Q24H  . DISCONTD: cefTRIAXone (ROCEPHIN)  IV  1 g Intravenous Q24H  . DISCONTD: furosemide  40 mg Intravenous BID  . DISCONTD: Tamsulosin HCl  0.4 mg Oral QHS   Continuous Infusions:    . sodium chloride 10,000 mL (12/04/11 2031)   PRN Meds:.acetaminophen, acetaminophen, alum & mag hydroxide-simeth, hydrALAZINE, HYDROmorphone (DILAUDID) injection, nitroGLYCERIN, ondansetron (ZOFRAN) IV, ondansetron, zolpidem  Assessment/Plan: Hypercalcemia Etiology unclear, improved. Patient reports that he drinks 3 glasses of milk daily, (breakfast, afternoon, and at bedtime), continue to hold milk. Received Pamidronate once on 12/04/2011. Continue IV fluids, continue oral lasix. Does not report taking Vit D at home. Vit D 25 and Vit D 1-25, PTH, PTH-related peptide, SPEP and UPEP pending. Discussed with renal (who discussed with on call oncology), has had CT of abd and pelvis in July and CT chest in May 2013, where no bony mets noted. Has had bone marrow biopsy in March of 2013, no signs of myeloma was commented.   Community acquired pneumonia Ceftriaxone to cefuroxime and azithromycin. Antibiotics since 12/04/2011. Stable.  Several bilateral subpleural nodules in May of 2013 Will need outpatient followup for CT in 6 months. Dr. Vassie Loll following.  Hyperlipidemia On statin. Held for the time being.  Hypertension Stable.  History of A. Fib Rate controlled. Coumadin per pharmacy.  CKD stage III Creatinine at baseline.  Anemia and Thrombocytopenia (Pancytopenia) due to Myelodysplastic syndrome Had bone marrow biopsy in March of 2013, myelodysplastic syndrome, Dr. Shirline Frees. Patient chose conservative approach. Hemoglobin and platelet count stable.  Coronary artery disease Stable.  Cachexia Encourage supplemental nutrition.  Prophylaxis SCDs.  Disposition Transfer to Med-Surg afib stable and chronic. If calcium improved tomorrow consider  discharge tomorrow.   LOS: 2 days  Nasiah Lehenbauer A, MD 12/05/2011, 9:07 AM

## 2011-12-05 NOTE — Progress Notes (Signed)
See Dr. Scherrie Gerlach note.  I discussed with radiology, Nolt way to look at the pancreas and biliary ducts without contrast is MRCP which I have ordered. Would recommend oncology consult for hypercalcemia on Monday and consider GI eval also.   [name]  MD [facility] [phone] pgr    [phone] cell 12/05/2011, 7:31 PM

## 2011-12-05 NOTE — Progress Notes (Signed)
[facility]  I am not working this weekend, but have followed Tracy [name] in office for several years for worsening renal function.   Over the last six months he has lost a great deal of weight.   CT scan of chest/abd/pelvis in July of this year showed near complete resolution of pulmonary nodule at R costophrenic angle.  It did show prominence of extrahepatic biliary ducts and pancreatic duct in the pancreatic head.  He has seen Tracy [name] (GI) in past.  It would be a good idea (in my opinion) for him to see Tracy [name] for this while he is still in the hospital. I have never seen Ca >15 from drinking milk! I think Tracy [name] has cancer somewhere and we should try to find it while he is in the hospital.             LOS: 2 days   Tracy [name] F 12/05/2011,6:38 PM   .labalb

## 2011-12-06 ENCOUNTER — Other Ambulatory Visit: Payer: Self-pay | Admitting: Urology

## 2011-12-06 ENCOUNTER — Other Ambulatory Visit: Payer: Self-pay | Admitting: Medical Oncology

## 2011-12-06 ENCOUNTER — Inpatient Hospital Stay (HOSPITAL_COMMUNITY): Payer: Medicare Other

## 2011-12-06 DIAGNOSIS — I4891 Unspecified atrial fibrillation: Secondary | ICD-10-CM

## 2011-12-06 DIAGNOSIS — I5032 Chronic diastolic (congestive) heart failure: Secondary | ICD-10-CM

## 2011-12-06 LAB — BASIC METABOLIC PANEL
Calcium: 10 mg/dL (ref 8.4–10.5)
GFR calc Af Amer: 21 mL/min — ABNORMAL LOW (ref >90)
GFR calc non Af Amer: 18 mL/min — ABNORMAL LOW (ref >90)
Potassium: 3.6 mEq/L (ref 3.5–5.1)
Sodium: 136 mEq/L (ref 135–145)

## 2011-12-06 LAB — CBC
MCV: 98.8 fL (ref 78.0–100.0)
Platelets: 97 10*3/uL — ABNORMAL LOW (ref 150–400)
RBC: 3.33 MIL/uL — ABNORMAL LOW (ref 4.22–5.81)
RDW: 16 % — ABNORMAL HIGH (ref 11.5–15.5)
WBC: 4.7 10*3/uL (ref 4.0–10.5)

## 2011-12-06 LAB — PROTIME-INR
INR: 2.44 — ABNORMAL HIGH (ref 0.00–1.49)
INR: 2.43 — ABNORMAL HIGH (ref 0.00–1.49)
Prothrombin Time: 26.9 seconds — ABNORMAL HIGH (ref 11.6–15.2)
Prothrombin Time: 26.8 seconds — ABNORMAL HIGH (ref 11.6–15.2)

## 2011-12-06 LAB — VITAMIN D 25 HYDROXY (VIT D DEFICIENCY, FRACTURES): Vit D, 25-Hydroxy: 25 ng/mL — ABNORMAL LOW (ref 30–89)

## 2011-12-06 LAB — TROPONIN I: Troponin I: 0.53 ng/mL (ref <0.30)

## 2011-12-06 LAB — CLOSTRIDIUM DIFFICILE BY PCR: Toxigenic C. Difficile by PCR: NEGATIVE

## 2011-12-06 MED ORDER — PROMETHAZINE HCL 25 MG/ML IJ SOLN: 12.5000 mg | Freq: Four times a day (QID) | INTRAMUSCULAR | Status: DC | PRN

## 2011-12-06 MED ORDER — LOPERAMIDE HCL 2 MG PO CAPS
2.0000 mg | ORAL_CAPSULE | Freq: Four times a day (QID) | ORAL | Status: DC | PRN
  Administered 2011-12-06: 2 mg via ORAL
  Filled 2011-12-06: qty 1

## 2011-12-06 MED ORDER — SODIUM CHLORIDE 0.9 % IV SOLN
INTRAVENOUS | Status: DC
  Administered 2011-12-09: 500 mL via INTRAVENOUS
  Administered 2011-12-09: 05:00:00 via INTRAVENOUS

## 2011-12-06 MED ORDER — PANTOPRAZOLE SODIUM 40 MG PO TBEC
40.0000 mg | DELAYED_RELEASE_TABLET | Freq: Two times a day (BID) | ORAL | Status: DC
  Administered 2011-12-06 – 2011-12-10 (×8): 40 mg via ORAL
  Filled 2011-12-06 (×8): qty 1

## 2011-12-06 MED ORDER — PROMETHAZINE HCL 25 MG PO TABS: 12.5000 mg | ORAL_TABLET | Freq: Four times a day (QID) | ORAL | Status: DC | PRN

## 2011-12-06 MED ORDER — CIPROFLOXACIN IN D5W 400 MG/200ML IV SOLN
400.0000 mg | INTRAVENOUS | Status: DC
  Filled 2011-12-06: qty 200

## 2011-12-06 NOTE — Progress Notes (Addendum)
   CARE MANAGEMENT NOTE 12/06/2011  Patient:  Tracy [name], Tracy [name]   Account Number:  [id number]  Date Initiated:  12/06/2011  Documentation initiated by:  Jayson Waterhouse  Subjective/Objective Assessment:   PT eval recommending HHPT, pt states that he will need rolling walker     Action/Plan:   Met with pt who selected [facility] for HHPT, [facility] notified.   Anticipated DC Date:  12/07/2011   Anticipated DC Plan:  HOME W HOME HEALTH SERVICES         [facility] Choice  HOME HEALTH   Choice offered to / List presented to:  C-1 Patient        HH arranged  HH-2 PT      [facility] agency  [facility]   Status of service:  In process, will continue to follow Medicare Important Message given?   (If response is "NO", the following Medicare IM given date fields will be blank) Date Medicare IM given:   Date Additional Medicare IM given:    Discharge Disposition:  HOME W HOME HEALTH SERVICES  Per UR Regulation:    If discussed at Long Length of Stay Meetings, dates discussed:    Comments:  12/06/2011 Met with pt re recommendation for HHPT, pt selected [facility] and states that he will need a rolling walker. Johny Shock RN MPH Case Manager [phone]   Pt also will need rolling walker, as he gave his last one away, however it has been 4 yr. Therefore pt will need to pay for this walker and he agrees that he can do this.  Johny Shock RN MPH

## 2011-12-06 NOTE — Consult Note (Signed)
[facility] Gastroenterology Consultation Note  Referring Provider: Dr. Andreas Blower [facility]) Primary Care Physician:  [name], MD Primary Gastroenterologist:  Dr. Vida Rigger  Reason for Consultation:  hematemesis  HPI: Tracy [name] is a 78 y.o. male admitted for confusion and hypercalcemia of still unclear etiology.  Is on anticoagulation for coronary artery disease and atrial fibrillation. Yesterday evening and this morning, during breakfast, he had couple episodes of nausea and vomiting followed by vomiting of some blood clots.  Stool is dark green, but no melena or hematochezia.  No prior bleeding.  Current INR 2.43.  He has no abdominal pain, change in bowel habits, dysphagia.  Endoscopy 2009 showed gastroduodenitis.  Colonoscopy October 2011 showed some small polyps.   Past Medical History  Diagnosis Date  . CAD (coronary artery disease)     status post prior PCI to the obtuse marginal in 1997 and stenting to the mid to distal RCA in 2001; LHC 9/06:  LM ok, pLAD 50%, mLAD 60% then 60-70%, mRI 50%, mOM1 80-90% (small), pRCA and dRCA stents ok, PDA 50%, EF 65%;  Myoview 5/12: No ischemia, EF 64%.;  NSTEMI 11/12 tx medically   . Chronic kidney disease, stage III (moderate)   . TIA (transient ischemic attack)   . HTN (hypertension)   . Hyperlipidemia   . AF (atrial fibrillation) 02/16/11    Coumadin  . Arthritis     "in my left ankle"  . Chronic diastolic heart failure   . Anemia of chronic disease   . Carotid stenosis     dopplers 03/2011: 0-39% bilateral    Past Surgical History  Procedure Date  . Nose surgery 1985    deviated septum  . Total knee arthroplasty 2009    left  . Joint replacement 2009    left knee  . Appendectomy 1930's  . Tonsillectomy and adenoidectomy 1930's  . Coronary angioplasty with stent placement     3 procedures; 3 stents total  . Cataract extraction w/ intraocular lens  implant, bilateral   . Surgery scrotal / testicular 1971    "testicle crushed;  removed"    Prior to Admission medications   Medication Sig Start Date End Date Taking? Authorizing Provider  acetaminophen (TYLENOL) 500 MG tablet Take 500 mg by mouth every 6 (six) hours as needed. For pain   Yes Historical Provider, MD  amLODipine (NORVASC) 10 MG tablet Take 10 mg by mouth every evening.    Yes Historical Provider, MD  cloNIDine (CATAPRES) 0.1 MG tablet Take 0.1 mg by mouth daily.  08/01/11  Yes [name], MD  isosorbide mononitrate (IMDUR) 30 MG 24 hr tablet Take 30 mg by mouth every evening.   Yes Historical Provider, MD  metoprolol (LOPRESSOR) 50 MG tablet Take 50 mg by mouth at bedtime.  02/22/11 02/22/12 Yes Jessica A Hope, PA-C  nitroGLYCERIN (NITROSTAT) 0.4 MG SL tablet Place 0.4 mg under the tongue every 5 (five) minutes as needed. For chest pain   Yes Historical Provider, MD  Tamsulosin HCl (FLOMAX) 0.4 MG CAPS Take 0.4 mg by mouth at bedtime. Take 2 tabs daily   Yes Historical Provider, MD  warfarin (COUMADIN) 5 MG tablet Take 5 mg by mouth daily.  07/02/11  Yes [name], MD    Current Facility-Administered Medications  Medication Dose Route Frequency Provider Last Rate Last Dose  . 0.9 %  sodium chloride infusion   Intravenous Continuous Belia Heman, MD 100 mL/hr at 12/04/11 2031 10,000 mL at 12/04/11 2031  .  acetaminophen (TYLENOL) tablet 650 mg  650 mg Oral Q6H PRN Ron Parker, MD       Or  . acetaminophen (TYLENOL) suppository 650 mg  650 mg Rectal Q6H PRN Ron Parker, MD      . alum & mag hydroxide-simeth (MAALOX/MYLANTA) 200-200-20 MG/5ML suspension 30 mL  30 mL Oral Q6H PRN Ron Parker, MD      . azithromycin (ZITHROMAX) tablet 250 mg  250 mg Oral Daily Cristal Ford, MD   250 mg at 12/06/11 1014  . calcitonin (MIACALCIN) injection 300 Units  300 Units Intramuscular BID Belia Heman, MD   300 Units at 12/06/11 1221  . cefUROXime (CEFTIN) tablet 500 mg  500 mg Oral BID WC Cristal Ford, MD   500 mg at 12/06/11 1015  .  cloNIDine (CATAPRES) tablet 0.1 mg  0.1 mg Oral Daily Ron Parker, MD   0.1 mg at 12/06/11 1014  . furosemide (LASIX) tablet 40 mg  40 mg Oral BID Maree Krabbe, MD   40 mg at 12/06/11 1015  . hydrALAZINE (APRESOLINE) injection 10 mg  10 mg Intravenous Q6H PRN Ron Parker, MD   10 mg at 12/04/11 0513  . HYDROmorphone (DILAUDID) injection 0.5-1 mg  0.5-1 mg Intravenous Q3H PRN Ron Parker, MD      . isosorbide mononitrate (IMDUR) 24 hr tablet 30 mg  30 mg Oral QPM Ron Parker, MD   30 mg at 12/05/11 1852  . loperamide (IMODIUM) capsule 2 mg  2 mg Oral QID PRN Cristal Ford, MD      . metoprolol (LOPRESSOR) tablet 50 mg  50 mg Oral QHS Ron Parker, MD   50 mg at 12/05/11 2230  . nitroGLYCERIN (NITROSTAT) SL tablet 0.4 mg  0.4 mg Sublingual Q5 min PRN Ron Parker, MD      . ondansetron (ZOFRAN) tablet 4 mg  4 mg Oral Q6H PRN Ron Parker, MD       Or  . ondansetron (ZOFRAN) injection 4 mg  4 mg Intravenous Q6H PRN Ron Parker, MD   4 mg at 12/06/11 1040  . promethazine (PHENERGAN) tablet 12.5 mg  12.5 mg Oral Q6H PRN Judie Bonus, MD       Or  . promethazine (PHENERGAN) injection 12.5 mg  12.5 mg Intravenous Q6H PRN Judie Bonus, MD      . Tamsulosin HCl [facility]) capsule 0.8 mg  0.8 mg Oral QHS Cristal Ford, MD   0.8 mg at 12/05/11 2230  . warfarin (COUMADIN) tablet 5 mg  5 mg Oral ONCE-1800 [name], PHARMD   5 mg at 12/05/11 2229  . Warfarin - Pharmacist Dosing Inpatient   Does not apply q1800 Ron Parker, MD      . zolpidem (AMBIEN) tablet 5 mg  5 mg Oral QHS PRN Ron Parker, MD      . DISCONTD: ciprofloxacin (CIPRO) IVPB 400 mg  400 mg Intravenous 60 min Pre-Op [name], MD        Allergies as of 12/03/2011 - Review Complete 12/03/2011  Allergen Reaction Noted  . Oxycodone hcl Other (See Comments) 07/02/2008  . Prednisone Other (See Comments) 07/02/2008    No family history on  file.  History   Social History  . Marital Status: Married    Spouse Name: N/A    Number of Children: N/A  . Years of Education: N/A  Occupational History  .     Social History Main Topics  . Smoking status: Former Smoker -- 2.0 packs/day for 40 years    Types: Cigarettes, Pipe    Quit date: 08/24/1978  . Smokeless tobacco: Never Used   Comment: 1980 quit smoking cigarrettes and pipe  . Alcohol Use: Yes     "glass of wine or beer once in awhile"  . Drug Use: No  . Sexually Active: Yes   Other Topics Concern  . Not on file   Social History Narrative  . No narrative on file    Review of Systems: As per HPI, all others negative.  Physical Exam: Vital signs in last 24 hours: Temp:  [97.5 F (36.4 C)-98.1 F (36.7 C)] 98 F (36.7 C) (09/09 1000) Pulse Rate:  [82-93] 82  (09/09 1400) Resp:  [16-20] 16  (09/09 1400) BP: (142-175)/(50-98) 142/75 mmHg (09/09 1400) SpO2:  [90 %-93 %] 91 % (09/09 1400) Last BM Date: 12/06/11 General:   Alert, much younger-appearing than stated age; Well-developed, well-nourished, pleasant and cooperative in NAD Head:  Normocephalic and atraumatic. Eyes:  Sclera clear, no icterus.   Conjunctiva pink. Ears:  Normal auditory acuity. Nose:  No deformity, discharge,  or lesions. Mouth:  No deformity or lesions.  Oropharynx pink & moist. Neck:  Supple; no masses or thyromegaly. Lungs:  Clear throughout to auscultation.   No wheezes, crackles, or rhonchi. No acute distress. Heart:  Irregularly iregular rate and rhythm; no murmurs, clicks, rubs,  or gallops. Abdomen:  Soft, nontender and nondistended. No masses, hepatosplenomegaly or hernias noted. Normal bowel sounds, without guarding, and without rebound.     Msk:  Symmetrical without gross deformities. Normal posture. Pulses:  Normal pulses noted. Extremities:  Without clubbing or edema. Neurologic:  Alert and  oriented x4;  Diffusely weak otherwise grossly normal neurologically. Skin:   Scattered ecchymoses; otherwise Intact without significant lesions or rashes. Psych:  Alert and cooperative. Normal mood and affect.   Lab Results:  Basename 12/05/11 0515 12/04/11 0655 12/03/11 1730  WBC 4.9 5.9 3.1*  HGB 10.6* 11.8* 11.5*  HCT 34.2* 38.2* 37.7*  PLT 122* 130* 132*   BMET  Basename 12/06/11 0919 12/05/11 0515 12/04/11 0655  NA 136 135 136  K 3.6 4.2 4.0  CL 102 102 101  CO2 20 21 23   GLUCOSE 99 112* 97  BUN 38* 45* 48*  CREATININE 2.89* 3.12* 3.31*  CALCIUM 10.0 11.8* 14.3*   LFT  Basename 12/05/11 0515 12/03/11 2353  PROT 7.0 --  ALBUMIN 3.6 --  AST 25 --  ALT 17 --  ALKPHOS 60 --  BILITOT 0.8 --  BILIDIR -- 0.3  IBILI -- 0.7   PT/INR  Basename 12/06/11 0919 12/06/11 0104  LABPROT 26.8* 26.9*  INR 2.43* 2.44*    Studies/Results: Mr Mrcp  12/06/2011  *RADIOLOGY REPORT*  Clinical Data:  Weight loss, dilated common duct and pancreatic duct on prior exam and possible common duct stone.  Anorexia.  MRI ABDOMEN WITHOUT CONTRAST (MRCP)  Technique: Multiplanar multisequence MR imaging of the abdomen was performed, including heavily T2-weighted images of the biliary and pancreatic ducts.  Three-dimensional MR images were rendered by post processing of the original MR data.  Comparison:  CT abdomen/pelvis 10/13/2011  Findings:  Trace perinephric stranding and bilateral T2 hyperintense renal cortical cysts are again noted, largest left lower renal pole 3.5 cm.  These are stable allowing for differences in technique.  No hydronephrosis.  Common duct is mildly  dilated measuring 1.1 cm at its midportion.  No pancreatic ductal dilatation.  T2 hyperintense too small to characterize 4 mm hyperintense focus left hepatic lobe lateral segment image 24 series 3 is most likely a cyst or possibly biliary hamartoma.  This may be too small to be previously visualized.  Spleen, adrenal glands, pancreas, and gallbladder are unremarkable.  No lymphadenopathy or ascites.  Disc  degenerative changes are noted in the spine.  Some series are degraded by patient respiratory motion. Allowing for this, on heavily T2-weighted images, there is no focal filling defect within the common duct.  There is gradual tapering to the level of the ampulla.  The liver is markedly hypointense on T1 and T2-weighted imaging which may indicate iron deposition.  No lymphadenopathy.  IMPRESSION: Mild common duct dilatation to 1 cm without filling defect allowing for mild motion artifact.  Gradual tapering to the ampulla is noted and there is no intrahepatic ductal dilatation.  Hepatic hypointensity which may indicate iron deposition.   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  No secondary imaging findings to suggest cirrhosis.   Original Report Authenticated By: Harrel Lemon, M.D.    Impression:  1.  Nausea and vomiting. 2.  Hematemesis by report.  Plan:  1.  PPI. 2.  Serial CBCs. 3.  Endoscopy once INR < 2.0, hopefully tomorrow. 4.  Thank you for the consult.   LOS: 3 days   Mariana Wiederholt M  12/06/2011, 3:19 PM

## 2011-12-06 NOTE — Progress Notes (Addendum)
Subjective: Vomited blood as per the renal team.  Feeling nauseated this morning and vomited.  Feeling weak.  Reports having diarrhea.  Objective: Vital signs in last 24 hours: Filed Vitals:   12/05/11 1904 12/05/11 2053 12/06/11 0512 12/06/11 1000  BP: 152/98 162/96 175/50 147/91  Pulse: 92 88 90 93  Temp: 97.5 F (36.4 C) 98.1 F (36.7 C) 97.5 F (36.4 C) 98 F (36.7 C)  TempSrc: Oral Oral Oral Oral  Resp: 20 20 20 16   Height:      Weight:      SpO2: 93% 90% 90% 90%   Weight change:   Intake/Output Summary (Last 24 hours) at 12/06/11 1250 Last data filed at 12/06/11 0900  Gross per 24 hour  Intake    330 ml  Output   2880 ml  Net  -2550 ml    Physical Exam: General: Awake, Oriented, No acute distress. HEENT: EOMI. Neck: Supple CV: S1 and S2 Lungs: Clear to ascultation bilaterally Abdomen: Soft, Nontender, Nondistended, +bowel sounds. Ext: Good pulses. Trace edema.  Lab Results: Basic Metabolic Panel:  Lab 12/06/11 1610 12/05/11 0515 12/04/11 0655 12/03/11 2353 12/03/11 1730 12/03/11 1430  NA 136 135 136 -- 135 135  K 3.6 4.2 4.0 -- 4.7 4.6  CL 102 102 101 -- 101 101  CO2 20 21 23  -- 23 22  GLUCOSE 99 112* 97 -- 126* 118*  BUN 38* 45* 48* -- 51* 51*  CREATININE 2.89* 3.12* 3.31* -- 3.43* 3.40*  CALCIUM 10.0 11.8* 14.3* -- >15.0* >15.0*  MG -- -- -- -- -- --  PHOS -- -- -- 6.7* -- 6.5*   Liver Function Tests:  Lab 12/05/11 0515 12/03/11 2353 12/03/11 1430  AST 25 20 --  ALT 17 17 --  ALKPHOS 60 72 --  BILITOT 0.8 1.0 --  PROT 7.0 7.4 --  ALBUMIN 3.6 3.8 3.9   No results found for this basename: LIPASE:5,AMYLASE:5 in the last 168 hours No results found for this basename: AMMONIA:5 in the last 168 hours CBC:  Lab 12/05/11 0515 12/04/11 0655 12/03/11 1730 12/03/11 1405  WBC 4.9 5.9 3.1* --  NEUTROABS -- -- -- --  HGB 10.6* 11.8* 11.5* 11.6*  HCT 34.2* 38.2* 37.7* --  MCV 95.3 95.7 95.9 --  PLT 122* 130* 132* --   Cardiac Enzymes: No results  found for this basename: CKTOTAL:5,CKMB:5,CKMBINDEX:5,TROPONINI:5 in the last 168 hours BNP (last 3 results)  Basename 08/04/11 1241 02/16/11 1044  PROBNP 601.0* 3945.0*   CBG: No results found for this basename: GLUCAP:5 in the last 168 hours No results found for this basename: HGBA1C:5 in the last 72 hours Other Labs: No components found with this basename: POCBNP:3 No results found for this basename: DDIMER:2 in the last 168 hours No results found for this basename: CHOL:2,HDL:2,LDLCALC:2,TRIG:2,CHOLHDL:2,LDLDIRECT:2 in the last 168 hours No results found for this basename: TSH,T4TOTAL,FREET3,T3FREE,FREET4,THYROIDAB in the last 168 hours  Lab 12/03/11 1430  VITAMINB12 --  FOLATE --  FERRITIN 87  TIBC 274  IRON 165*  RETICCTPCT --    Micro Results: Recent Results (from the past 240 hour(s))  MRSA PCR SCREENING     Status: Normal   Collection Time   12/04/11  3:00 AM      Component Value Range Status Comment   MRSA by PCR NEGATIVE  NEGATIVE Final   CULTURE, BLOOD (ROUTINE X 2)     Status: Normal (Preliminary result)   Collection Time   12/04/11 11:10 AM  Component Value Range Status Comment   Specimen Description BLOOD RIGHT HAND   Final    Special Requests BOTTLES DRAWN AEROBIC ONLY 5CC   Final    Culture  Setup Time 12/04/2011 19:27   Final    Culture     Final    Value:        BLOOD CULTURE RECEIVED NO GROWTH TO DATE CULTURE WILL BE HELD FOR 5 DAYS BEFORE ISSUING A FINAL NEGATIVE REPORT   Report Status PENDING   Incomplete   CULTURE, BLOOD (ROUTINE X 2)     Status: Normal (Preliminary result)   Collection Time   12/04/11 11:15 AM      Component Value Range Status Comment   Specimen Description BLOOD LEFT HAND   Final    Special Requests BOTTLES DRAWN AEROBIC ONLY 5CC   Final    Culture  Setup Time 12/04/2011 19:27   Final    Culture     Final    Value:        BLOOD CULTURE RECEIVED NO GROWTH TO DATE CULTURE WILL BE HELD FOR 5 DAYS BEFORE ISSUING A FINAL NEGATIVE  REPORT   Report Status PENDING   Incomplete   CLOSTRIDIUM DIFFICILE BY PCR     Status: Normal   Collection Time   12/06/11 12:37 AM      Component Value Range Status Comment   C difficile by pcr NEGATIVE  NEGATIVE Final     Studies/Results: Mr Mrcp  12/06/2011  *RADIOLOGY REPORT*  Clinical Data:  Weight loss, dilated common duct and pancreatic duct on prior exam and possible common duct stone.  Anorexia.  MRI ABDOMEN WITHOUT CONTRAST (MRCP)  Technique: Multiplanar multisequence MR imaging of the abdomen was performed, including heavily T2-weighted images of the biliary and pancreatic ducts.  Three-dimensional MR images were rendered by post processing of the original MR data.  Comparison:  CT abdomen/pelvis 10/13/2011  Findings:  Trace perinephric stranding and bilateral T2 hyperintense renal cortical cysts are again noted, largest left lower renal pole 3.5 cm.  These are stable allowing for differences in technique.  No hydronephrosis.  Common duct is mildly dilated measuring 1.1 cm at its midportion.  No pancreatic ductal dilatation.  T2 hyperintense too small to characterize 4 mm hyperintense focus left hepatic lobe lateral segment image 24 series 3 is most likely a cyst or possibly biliary hamartoma.  This may be too small to be previously visualized.  Spleen, adrenal glands, pancreas, and gallbladder are unremarkable.  No lymphadenopathy or ascites.  Disc degenerative changes are noted in the spine.  Some series are degraded by patient respiratory motion. Allowing for this, on heavily T2-weighted images, there is no focal filling defect within the common duct.  There is gradual tapering to the level of the ampulla.  The liver is markedly hypointense on T1 and T2-weighted imaging which may indicate iron deposition.  No lymphadenopathy.  IMPRESSION: Mild common duct dilatation to 1 cm without filling defect allowing for mild motion artifact.  Gradual tapering to the ampulla is noted and there is no  intrahepatic ductal dilatation.  Hepatic hypointensity which may indicate iron deposition.   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  No secondary imaging findings to suggest cirrhosis.   Original Report Authenticated By: Harrel Lemon, M.D.     Medications: I have reviewed the patient's current medications. Scheduled Meds:    . azithromycin  250 mg Oral Daily  . calcitonin  300 Units  Intramuscular BID  . cefUROXime  500 mg Oral BID WC  . cloNIDine  0.1 mg Oral Daily  . furosemide  40 mg Oral BID  . isosorbide mononitrate  30 mg Oral QPM  . metoprolol  50 mg Oral QHS  . Tamsulosin HCl  0.8 mg Oral QHS  . warfarin  5 mg Oral ONCE-1800  . Warfarin - Pharmacist Dosing Inpatient   Does not apply q1800  . DISCONTD: ciprofloxacin  400 mg Intravenous 60 min Pre-Op   Continuous Infusions:    . sodium chloride 10,000 mL (12/04/11 2031)   PRN Meds:.acetaminophen, acetaminophen, alum & mag hydroxide-simeth, hydrALAZINE, HYDROmorphone (DILAUDID) injection, nitroGLYCERIN, ondansetron (ZOFRAN) IV, ondansetron, promethazine, promethazine, zolpidem  Assessment/Plan: Hypercalcemia Etiology unclear, improved. Patient reports that he drinks 3 glasses of milk daily, (breakfast, afternoon, and at bedtime), continue to hold milk. Received Pamidronate once on 12/04/2011. IV fluids and on continue oral lasix. Does not report taking Vit D at home. Vit D 25 and Vit D 1-25, PTH, PTH-related peptide, SPEP and UPEP pending. Discussed with renal (who discussed with on call oncology), has had CT of abd and pelvis in July and CT chest in May 2013, where no bony mets noted. Has had bone marrow biopsy in March of 2013, no signs of myeloma was commented.   Hematemesis Renal has consulted [facility] gastroenterology for further evaluation.  Hemoglobin stable.  Continue to trend CBC.  Patient on anticoagulation for A. fib, and do not see the need to reverse anticoagulation at this  time.  INR therapeutic at this time.  Community acquired pneumonia Ceftriaxone to cefuroxime and azithromycin. Antibiotics since 12/04/2011. Stable.  Several bilateral subpleural nodules in May of 2013 Will need outpatient followup for CT in 6 months. Dr. Vassie Loll following.  Hyperlipidemia On statin. Held for the time being.  Hypertension Stable.  History of A. Fib Rate controlled. Coumadin per pharmacy.  Therapeutic INR.  CKD stage III Creatinine at baseline.  Anemia and Thrombocytopenia (Pancytopenia) due to Myelodysplastic syndrome Had bone marrow biopsy in March of 2013, myelodysplastic syndrome, Dr. Shirline Frees. Patient chose conservative approach. Hemoglobin and platelet count stable.  Shortness of breath May be due to generalized weakness and deconditioning.  Per discussion with patient and wife appears to be chronic.  Lase 2-D echocardiogram on November of 2012, wall thickness was increased in a pattern of severe LVH, systolic function was normal, ejection fraction 50-55%, wall motion was normal there were no regional wall abnormalities.  Per discussion with patient and wife, patient may be in the process of getting pulmonary rehabilitation.  Trend troponin x2.  Likely will need cardiology evaluation as outpatient.  Coronary artery disease Stable.  Cachexia Encourage supplemental nutrition.  Diarrhea Cdiff PCR negative.  Start the patient on Imodium as needed for diarrhea.  Maybe due to antibiotics.  Continue to monitor.  Prophylaxis SCDs.  Disposition Continue care in Med-Surg afib stable and chronic.  Pending GI evaluation.   LOS: 3 days  Calvary Difranco A, MD 12/06/2011, 12:50 PM  Addendum: Troponin came back elevated at 0.49, given concern for possible GI bleeding, will not anticoagulate the patient on heparin.  Currently on therapeutic anticoagulation on Coumadin.  Will request cardiology consultation.  Gail Vendetti A, MD 12/06/2011, 3:05 PM

## 2011-12-06 NOTE — Progress Notes (Signed)
I have seen and examined this patient and agree with plan as outlined above.  Pt developed N/V after eating a small amount of oatmeal.  Per family, this occurs everytime he eats.  There was some gelatinous blood in emesis and will consult GI to evaluate.  Calcium improving with conservative therapy, although etiology is still unclear. Byrd Rushlow A,MD 12/06/2011 11:34 AM

## 2011-12-06 NOTE — Consult Note (Signed)
ANTICOAGULATION CONSULT - COUMADIN  Pharmacy Consult for Coumadin  HPI: 78 y.o.male admitted for presumed Hypercalcemia of Malignancy vs Vit D toxicity.  The patient is currently on warfarin for atrial fibrillation.  Height/Weight: Height: 6\' 1"  (185.4 cm) Weight: 172 lb 13.5 oz (78.4 kg) IBW/kg (Calculated) : 79.9   Vitals: Blood pressure 147/91, pulse 93, temperature 98 F (36.7 C), temperature source Oral, resp. rate 16, height 6\' 1"  (1.854 m), weight 172 lb 13.5 oz (78.4 kg), SpO2 90.00%.  Current active problems: Principal Problem:  *Hypercalcemia Active Problems:  HYPERLIPIDEMIA  Essential hypertension, benign  CAD  Atrial fibrillation  Chronic diastolic heart failure  Pancytopenia  CKD (chronic kidney disease), stage III  Cachexia   Medical / Surgical History: Past Medical History  Diagnosis Date  . CAD (coronary artery disease)     status post prior PCI to the obtuse marginal in 1997 and stenting to the mid to distal RCA in 2001; LHC 9/06:  LM ok, pLAD 50%, mLAD 60% then 60-70%, mRI 50%, mOM1 80-90% (small), pRCA and dRCA stents ok, PDA 50%, EF 65%;  Myoview 5/12: No ischemia, EF 64%.;  NSTEMI 11/12 tx medically   . Chronic kidney disease, stage III (moderate)   . TIA (transient ischemic attack)   . HTN (hypertension)   . Hyperlipidemia   . AF (atrial fibrillation) 02/16/11    Coumadin  . Arthritis     "in my left ankle"  . Chronic diastolic heart failure   . Anemia of chronic disease   . Carotid stenosis     dopplers 03/2011: 0-39% bilateral   Past Surgical History  Procedure Date  . Nose surgery 1985    deviated septum  . Total knee arthroplasty 2009    left  . Joint replacement 2009    left knee  . Appendectomy 1930's  . Tonsillectomy and adenoidectomy 1930's  . Coronary angioplasty with stent placement     3 procedures; 3 stents total  . Cataract extraction w/ intraocular lens  implant, bilateral   . Surgery scrotal / testicular 1971   "testicle crushed; removed"    Current Labs:  Basename 12/06/11 0919 12/06/11 0104 12/05/11 0515 12/04/11 0655 12/03/11 1730  HGB -- -- 10.6* 11.8* --  HCT -- -- 34.2* 38.2* 37.7*  PLT -- -- 122* 130* 132*  LABPROT 26.8* 26.9* 28.6* -- --  INR 2.43* 2.44* 2.64* -- --  CREATININE 2.89* -- 3.12* 3.31* --   Lab Results  Component Value Date   INR 2.43* 12/06/2011   INR 2.44* 12/06/2011   INR 2.64* 12/05/2011   Estimated Creatinine Clearance: 18.8 ml/min (by C-G formula based on Cr of 2.89).  Pertinent Medication History: Scheduled:    . azithromycin  250 mg Oral Daily  . calcitonin  300 Units Intramuscular BID  . cefUROXime  500 mg Oral BID WC  . cloNIDine  0.1 mg Oral Daily  . furosemide  40 mg Oral BID  . isosorbide mononitrate  30 mg Oral QPM  . metoprolol  50 mg Oral QHS  . Tamsulosin HCl  0.8 mg Oral QHS  . warfarin  5 mg Oral ONCE-1800  . Warfarin - Pharmacist Dosing Inpatient   Does not apply q1800  . DISCONTD: ciprofloxacin  400 mg Intravenous 60 min Pre-Op    Assessment:  The patient developed N/V after eating this AM.  There was some blood in the emesis.  Today's INR has been stable, HGB 11.8 > 10.6.  GI consult is pending.  Plan:  After conferring with Dr. Arrie Aran, I will hold Coumadin pending GI evaluation.    Daily INR's, CBC.  Orren Pietsch, Elisha Headland, Pharm. D. 12/06/2011, 12:52 PM

## 2011-12-06 NOTE — Progress Notes (Signed)
Subjective: Feeling okay this morning, went for MRCP. Very hungry but vomits just after starting to eat. Some blood (old, dark red) in the vomitus. Otherwise feeling okay. Mental status in tact. No pain today.  Objective Vital signs in last 24 hours: Filed Vitals:   12/05/11 1343 12/05/11 1904 12/05/11 2053 12/06/11 0512  BP: 146/91 152/98 162/96 175/50  Pulse: 98 92 88 90  Temp: 98.6 F (37 C) 97.5 F (36.4 C) 98.1 F (36.7 C) 97.5 F (36.4 C)  TempSrc: Oral Oral Oral Oral  Resp: 20 20 20 20   Height:      Weight:      SpO2: 97% 93% 90% 90%   Weight change:   Intake/Output Summary (Last 24 hours) at 12/06/11 1019 Last data filed at 12/06/11 0600  Gross per 24 hour  Intake    330 ml  Output   2880 ml  Net  -2550 ml   Labs: Basic Metabolic Panel:  Lab 12/05/11 4782 12/04/11 0655 12/03/11 2353 12/03/11 1730 12/03/11 1430  NA 135 136 -- 135 135  K 4.2 4.0 -- 4.7 4.6  CL 102 101 -- 101 101  CO2 21 23 -- 23 22  GLUCOSE 112* 97 -- 126* 118*  BUN 45* 48* -- 51* 51*  CREATININE 3.12* 3.31* -- 3.43* 3.40*  ALB -- -- -- -- --  CALCIUM 11.8* 14.3* -- >15.0* >15.0*  PHOS -- -- 6.7* -- 6.5*   Liver Function Tests:  Lab 12/05/11 0515 12/03/11 2353 12/03/11 1430  AST 25 20 --  ALT 17 17 --  ALKPHOS 60 72 --  BILITOT 0.8 1.0 --  PROT 7.0 7.4 --  ALBUMIN 3.6 3.8 3.9   CBC:  Lab 12/05/11 0515 12/04/11 0655 12/03/11 1730 12/03/11 1405  WBC 4.9 5.9 3.1* --  NEUTROABS -- -- -- --  HGB 10.6* 11.8* 11.5* 11.6*  HCT 34.2* 38.2* 37.7* --  MCV 95.3 95.7 95.9 --  PLT 122* 130* 132* --   Iron Studies:   Lab 12/03/11 1430  IRON 165*  TIBC 274  TRANSFERRIN --  FERRITIN 87    Physical Exam:  Blood pressure 175/50, pulse 90, temperature 97.5 F (36.4 C), temperature source Oral, resp. rate 20, height 6\' 1"  (1.854 m), weight 172 lb 13.5 oz (78.4 kg), SpO2 90.00%.  General: resting in chair, eating breakfast HEENT: PERRL, EOMI, no scleral icterus Cardiac: RRR, no rubs,  murmurs or gallops Pulm: clear to auscultation bilaterally, moving normal volumes of air Abd: soft, nontender, nondistended, BS present Ext: warm and well perfused, no pedal edema Neuro: alert and oriented X3, cranial nerves II-XII grossly intact  Assessment/Plan 1 Hypercalcemia- d/w oncology on call. Hypercalcemia of malignancy (i.e.bony mets or myeloma) would be grossly apparent on CT studies (abd in July and chest in May). They said they would see him if needed for this issue- would recommend outpt f/u in with his oncologist after discharge. Could have vit D toxicity, levels pending. PTH from Aug 9 is suppressed. Recommend continue calcitonin, NS and change lasix to po. SPEP and UPEP are pending.  MRCP unremarkable.  -Consult GI for endoscopy given hematemasis -Check TSH  2 Pancytopenia, s/p BM bx   3 BPH, due for TURP in Oct  4 Afib - Per primary team, INR 2.44 today.  5 HTN - Per primary team.  6 CAD - Per primary team.  Genella Mech MD 12/06/2011, 10:19 AM PGY-2

## 2011-12-06 NOTE — Significant Event (Signed)
CRITICAL VALUE ALERT  Critical value received:  TROPONIN 0.49  Date of notification:  12/06/11  Time of notification:  1445  Critical value read back:yes  Nurse who received alert:  Laverda Sorenson  MD notified (1st page):  DR. Betti Cruz  Time of first page:  1500  MD notified (2nd page):  Time of second page:  Responding MD:  DR. Betti Cruz  Time MD responded:  1505  Laverda Sorenson, RN

## 2011-12-06 NOTE — Progress Notes (Signed)
Pt c/o abd pain and not being able to void at 2200 pt was in and out cath 700,at 0600, pt c/o not being able to void,in and out cath done 1000cc was the result pt felt better.Artemio Aly RN.

## 2011-12-06 NOTE — Consult Note (Signed)
CARDIOLOGY CONSULT NOTE    Patient ID: Tracy [name] MRN: [id number] DOB/AGE: 30-Apr-1919 78 y.o.  Admit date: 12/03/2011 Referring Physician: Betti Cruz Primary Physician: [name], MD Primary Cardiologist:  Antoine Poche Reason for Consultation: Elevated Troponin  Principal Problem:  *Hypercalcemia Active Problems:  HYPERLIPIDEMIA  Essential hypertension, benign  CAD  Atrial fibrillation  Chronic diastolic heart failure  Pancytopenia  CKD (chronic kidney disease), stage III  Cachexia   HPI:  Tracy [name] is a 78 y.o. male who was called by his nephrologist, Dr. Caryn Section, after his lab results returned, and he was told to go immediately to the ED because of his calcium level of 15. Patient reports going to his nephrologist to have bloodwork done. He had been receiving Aranesp injections for his anemia, and and the last 2 times his hemoglobin had been 11 so the injections were discontinued for now. He reports undergoing a workup for his prostate and is to have a procedure done. He has had increased confusion and weakness over the past week, and he reports losing his appetite over the past 2 months and losing about 20 pounds. He denies having any fevers or chills.  From a cardiac perspective he has been stable.  No chest pain.  Troponin only .46.  Last seen by Dr Antoine Poche 11/25/11  Cleared for possible prostate surgery in October.  Cardiac history includes the following.  Prior PCI to the OM in 1997 and stenting to the mid to distal RCA in 2001, HTN, hyperlipidemia, stage III CKD and prior TIA. Last LHC 9/06: LM ok, pLAD 50%, mLAD 60% then 60-70%, mRI 50%, mOM1 80-90% (small), pRCA and dRCA stents ok, PDA 50%, EF 65%. Thus, he had high-grade small vessel disease and diffuse nonobstructive large vessel disease. This was treated medically. Last Myoview 5/12: No ischemia, EF 64%.   Admitted 01/2011 with NSTEMI in the setting of diastolic heart failure. This was c/b AFib with RVR. He was started on  Coumadin. With his CKD, age and recent low risk Myoview, medical therapy was pursued. Echocardiogram 02/17/11: Severe LVH, asymmetric hypertrophy, EF 50-55%, normal wall motion, high ventricular filling pressure, mild LAE, mild RVE, mildly reduced RVSF, mild RAE.    @ROS @ All other systems reviewed and negative except as noted above  Past Medical History  Diagnosis Date  . CAD (coronary artery disease)     status post prior PCI to the obtuse marginal in 1997 and stenting to the mid to distal RCA in 2001; LHC 9/06:  LM ok, pLAD 50%, mLAD 60% then 60-70%, mRI 50%, mOM1 80-90% (small), pRCA and dRCA stents ok, PDA 50%, EF 65%;  Myoview 5/12: No ischemia, EF 64%.;  NSTEMI 11/12 tx medically   . Chronic kidney disease, stage III (moderate)   . TIA (transient ischemic attack)   . HTN (hypertension)   . Hyperlipidemia   . AF (atrial fibrillation) 02/16/11    Coumadin  . Arthritis     "in my left ankle"  . Chronic diastolic heart failure   . Anemia of chronic disease   . Carotid stenosis     dopplers 03/2011: 0-39% bilateral    No family history on file.  History   Social History  . Marital Status: Married    Spouse Name: N/A    Number of Children: N/A  . Years of Education: N/A   Occupational History  .     Social History Main Topics  . Smoking status: Former Smoker -- 2.0 packs/day for 40 years  Types: Cigarettes, Pipe    Quit date: 08/24/1978  . Smokeless tobacco: Never Used   Comment: 1980 quit smoking cigarrettes and pipe  . Alcohol Use: Yes     "glass of wine or beer once in awhile"  . Drug Use: No  . Sexually Active: Yes   Other Topics Concern  . Not on file   Social History Narrative  . No narrative on file    Past Surgical History  Procedure Date  . Nose surgery 1985    deviated septum  . Total knee arthroplasty 2009    left  . Joint replacement 2009    left knee  . Appendectomy 1930's  . Tonsillectomy and adenoidectomy 1930's  . Coronary angioplasty  with stent placement     3 procedures; 3 stents total  . Cataract extraction w/ intraocular lens  implant, bilateral   . Surgery scrotal / testicular 1971    "testicle crushed; removed"        . azithromycin  250 mg Oral Daily  . calcitonin  300 Units Intramuscular BID  . cefUROXime  500 mg Oral BID WC  . cloNIDine  0.1 mg Oral Daily  . furosemide  40 mg Oral BID  . isosorbide mononitrate  30 mg Oral QPM  . metoprolol  50 mg Oral QHS  . Tamsulosin HCl  0.8 mg Oral QHS  . warfarin  5 mg Oral ONCE-1800  . Warfarin - Pharmacist Dosing Inpatient   Does not apply q1800  . DISCONTD: ciprofloxacin  400 mg Intravenous 60 min Pre-Op      . sodium chloride 10,000 mL (12/04/11 2031)    Physical Exam: Blood pressure 142/75, pulse 82, temperature 98 F (36.7 C), temperature source Oral, resp. rate 16, height 6\' 1"  (1.854 m), weight 172 lb 13.5 oz (78.4 kg), SpO2 91.00%.    Affect appropriate Elderly thin male HEENT: normal poor dentition Neck supple with no adenopathy JVP normal no bruits no thyromegaly Lungs clear with no wheezing and good diaphragmatic motion Heart:  S1/S2 no murmur, no rub, gallop or click PMI normal Abdomen: benighn, BS positve, no tenderness, no AAA S/P appy no bruit.  No HSM or HJR Distal pulses intact with no bruits No edema Neuro non-focal Skin warm and dry No muscular weakness   Labs:   Lab Results  Component Value Date   WBC 4.9 12/05/2011   HGB 10.6* 12/05/2011   HCT 34.2* 12/05/2011   MCV 95.3 12/05/2011   PLT 122* 12/05/2011    Lab 12/06/11 0919 12/05/11 0515  NA 136 --  K 3.6 --  CL 102 --  CO2 20 --  BUN 38* --  CREATININE 2.89* --  CALCIUM 10.0 --  PROT -- 7.0  BILITOT -- 0.8  ALKPHOS -- 60  ALT -- 17  AST -- 25  GLUCOSE 99 --   Lab Results  Component Value Date   CKTOTAL 121 02/17/2011   CKMB 7.7* 02/17/2011   TROPONINI 0.49* 12/06/2011    Lab Results  Component Value Date   CHOL 144 02/17/2011   Lab Results  Component Value  Date   HDL 59 02/17/2011   Lab Results  Component Value Date   LDLCALC 67 02/17/2011   Lab Results  Component Value Date   TRIG 88 02/17/2011   Lab Results  Component Value Date   CHOLHDL 2.4 02/17/2011   No results found for this basename: LDLDIRECT      Radiology: Dg Chest 2 View  12/04/2011  *  RADIOLOGY REPORT*  Clinical Data: COPD.  CHEST - 2 VIEW  Comparison: 08/04/2011  Findings: New airspace disease is seen in the left lower lobe, consistent with pneumonia.  Changes of COPD again demonstrated. Mild cardiomegaly is stable.  No evidence of congestive heart failure or pleural effusion.  IMPRESSION:  1.  New left lower lobe airspace disease, consistent with pneumonia. 2.  COPD. 3.  Stable mild cardiomegaly.   Original Report Authenticated By: Danae Orleans, M.D.    Mr Mrcp  12/06/2011  *RADIOLOGY REPORT*  Clinical Data:  Weight loss, dilated common duct and pancreatic duct on prior exam and possible common duct stone.  Anorexia.  MRI ABDOMEN WITHOUT CONTRAST (MRCP)  Technique: Multiplanar multisequence MR imaging of the abdomen was performed, including heavily T2-weighted images of the biliary and pancreatic ducts.  Three-dimensional MR images were rendered by post processing of the original MR data.  Comparison:  CT abdomen/pelvis 10/13/2011  Findings:  Trace perinephric stranding and bilateral T2 hyperintense renal cortical cysts are again noted, largest left lower renal pole 3.5 cm.  These are stable allowing for differences in technique.  No hydronephrosis.  Common duct is mildly dilated measuring 1.1 cm at its midportion.  No pancreatic ductal dilatation.  T2 hyperintense too small to characterize 4 mm hyperintense focus left hepatic lobe lateral segment image 24 series 3 is most likely a cyst or possibly biliary hamartoma.  This may be too small to be previously visualized.  Spleen, adrenal glands, pancreas, and gallbladder are unremarkable.  No lymphadenopathy or ascites.  Disc  degenerative changes are noted in the spine.  Some series are degraded by patient respiratory motion. Allowing for this, on heavily T2-weighted images, there is no focal filling defect within the common duct.  There is gradual tapering to the level of the ampulla.  The liver is markedly hypointense on T1 and T2-weighted imaging which may indicate iron deposition.  No lymphadenopathy.  IMPRESSION: Mild common duct dilatation to 1 cm without filling defect allowing for mild motion artifact.  Gradual tapering to the ampulla is noted and there is no intrahepatic ductal dilatation.  Hepatic hypointensity which may indicate iron deposition.   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  No secondary imaging findings to suggest cirrhosis.   Original Report Authenticated By: Harrel Lemon, M.D.     EKG: 12/04/11  AFib rate 81 LAD poor R wave progression PVC  No acute ischemic changes   ASSESSMENT AND PLAN:  CAD:  Insignificant elevation in enzymes with no chest pain and no acute ECG changes.  Continue medical Rx nitrates and beta blocker AFib:  Rate control is fine  Coumadin being held for EGD per GI.  Dont think he needs heparin/coumadin overlap.   Daistolic CHF:  Seems dry to euvolemic  On lasix bid and would not increase given CRF CRF:  Per Dr Caryn Section Rx elevated Calcium and continue aranasp as needed Hb now 34 despit ? Hematemesis Confusion:  Seems to be resolving  No focal neuro signs  Calcium 15->10  improved  Signed: [name] 12/06/2011, 3:55 PM

## 2011-12-07 DIAGNOSIS — N183 Chronic kidney disease, stage 3 unspecified: Secondary | ICD-10-CM

## 2011-12-07 LAB — UIFE/LIGHT CHAINS/TP QN, 24-HR UR
Alpha 1, Urine: DETECTED — AB
Alpha 2, Urine: DETECTED — AB
Alpha 2, Urine: DETECTED — AB
Free Kappa Lt Chains,Ur: 11.8 mg/dL — ABNORMAL HIGH (ref 0.14–2.42)
Free Kappa Lt Chains,Ur: 12.7 mg/dL — ABNORMAL HIGH (ref 0.14–2.42)
Free Kappa/Lambda Ratio: 6.67 ratio (ref 2.04–10.37)
Free Kappa/Lambda Ratio: 5.83 ratio (ref 2.04–10.37)
Total Protein, Urine: 24.6 mg/dL
Total Protein, Urine: 25.3 mg/dL

## 2011-12-07 LAB — PROTEIN ELECTROPHORESIS, SERUM
Albumin ELP: 56.6 % (ref 55.8–66.1)
Beta 2: 3.8 % (ref 3.2–6.5)
Total Protein ELP: 6.4 g/dL (ref 6.0–8.3)

## 2011-12-07 LAB — CBC
Hemoglobin: 8.9 g/dL — ABNORMAL LOW (ref 13.0–17.0)
MCH: 29.6 pg (ref 26.0–34.0)
Platelets: 75 10*3/uL — ABNORMAL LOW (ref 150–400)
RBC: 3.01 MIL/uL — ABNORMAL LOW (ref 4.22–5.81)
WBC: 4.2 10*3/uL (ref 4.0–10.5)

## 2011-12-07 LAB — PROTIME-INR
INR: 2.75 — ABNORMAL HIGH (ref 0.00–1.49)
Prothrombin Time: 29.5 seconds — ABNORMAL HIGH (ref 11.6–15.2)

## 2011-12-07 LAB — BASIC METABOLIC PANEL
CO2: 21 mEq/L (ref 19–32)
Chloride: 108 mEq/L (ref 96–112)
Glucose, Bld: 88 mg/dL (ref 70–99)
Potassium: 3.2 mEq/L — ABNORMAL LOW (ref 3.5–5.1)
Sodium: 138 mEq/L (ref 135–145)

## 2011-12-07 MED ORDER — ATORVASTATIN CALCIUM 20 MG PO TABS
20.0000 mg | ORAL_TABLET | Freq: Every day | ORAL | Status: DC
  Administered 2011-12-07 – 2011-12-09 (×3): 20 mg via ORAL
  Filled 2011-12-07 (×4): qty 1

## 2011-12-07 MED ORDER — PHYTONADIONE 5 MG PO TABS
5.0000 mg | ORAL_TABLET | Freq: Once | ORAL | Status: AC
  Administered 2011-12-07: 5 mg via ORAL
  Filled 2011-12-07 (×2): qty 1

## 2011-12-07 NOTE — Consult Note (Signed)
Tracy [name]: Tracy [name] admitted for presumed Hypercalcemia of Malignancy vs Vit D toxicity. The patient is currently on warfarin for atrial fibrillation.    Height/Weight: Height: 6\' 1"  (185.4 cm) Weight: 157 lb 6.5 oz (71.4 kg) IBW/kg (Calculated) : 79.9   Vitals: Blood pressure 132/81, pulse Tracy, temperature 98.7 F (37.1 C), temperature source Oral, resp. rate 18, height 6\' 1"  (1.854 m), weight 157 lb 6.5 oz (71.4 kg), SpO2 92.00%.  Current active problems: Active Problems:  = Hypercalcemia =  HYPERLIPIDEMIA  Essential hypertension, benign  CAD  Atrial fibrillation  Chronic diastolic heart failure  Pancytopenia  CKD (chronic kidney disease), stage III  Cachexia  New onset of hematemesis  Medical / Surgical History: Past Medical History  Diagnosis Date  . CAD (coronary artery disease)     status post prior PCI to the obtuse marginal in 1997 and stenting to the mid to distal RCA in 2001; LHC 9/06:  LM ok, pLAD 50%, mLAD 60% then 60-70%, mRI 50%, mOM1 80-90% (small), pRCA and dRCA stents ok, PDA 50%, EF 65%;  Myoview 5/12: No ischemia, EF 64%.;  NSTEMI 11/12 tx medically   . Chronic kidney disease, stage III (moderate)   . TIA (transient ischemic attack)   . HTN (hypertension)   . Hyperlipidemia   . AF (atrial fibrillation) 02/16/11    Coumadin  . Arthritis     "in my left ankle"  . Chronic diastolic heart failure   . Anemia of chronic disease   . Carotid stenosis     dopplers 03/2011: 0-39% bilateral   Past Surgical History  Procedure Date  . Nose surgery 1985    deviated septum  . Total knee arthroplasty 2009    left  . Joint replacement 2009    left knee  . Appendectomy 1930's  . Tonsillectomy and adenoidectomy 1930's  . Coronary angioplasty with stent placement     3 procedures; 3 stents total  . Cataract extraction w/ intraocular lens  implant, bilateral   . Surgery scrotal / testicular 1971      "testicle crushed; removed"    Current Labs:   Basename 12/07/11 0617 12/06/11 1847 12/06/11 1630 12/06/11 1321 12/06/11 0919 12/06/11 0104 12/05/11 0515  HGB 8.9* -- 9.7* -- -- -- --  HCT 29.1* -- 32.9* -- -- -- 34.2*  PLT 75* -- 97* -- -- -- 122*  LABPROT 29.5* -- -- -- 26.8* 26.9* --  INR 2.75* -- -- -- 2.43* 2.44* --  CREATININE 2.91* -- -- -- 2.89* -- 3.12*  TROPONINI -- 0.53* -- 0.49* -- -- --   Lab Results  Component Value Date   INR 2.75* 12/07/2011   INR 2.43* 12/06/2011   INR 2.44* 12/06/2011   Estimated Creatinine Clearance: 17 ml/min (by C-G formula based on Cr of 2.91).  Pertinent Medication History: Scheduled:  .  azithromycin  250 mg  Oral  Daily   .  calcitonin  300 Units  Intramuscular  BID   .  cefUROXime  500 mg  Oral  BID WC   .  cloNIDine  0.1 mg  Oral  Daily   .  furosemide  40 mg  Oral  BID   .  isosorbide mononitrate  30 mg  Oral  QPM   .  metoprolol  50 mg  Oral  QHS   .  Tamsulosin HCl  0.8 mg  Oral  QHS   .  warfarin  5 mg  Oral  ONCE-1800   .  Warfarin - Pharmacist Dosing Inpatient   Does not apply  q1800    Assessment:  Tracy y/o male admitted for confusion and hypercalcemia of still unclear etiology.  He is on Tracy for coronary artery disease and atrial fibrillation.   9/8 during the evening and the following morning with breakfast, he had a couple of episodes of nausea and vomiting that was followed by vomiting of some blood clots. Stool is dark green, but no melena or hematochezia. No prior bleeding.  WBC/Hgb/Hct/Plts:  4.2/ 8.9/ 29.1/ =75=   [9/10] which is down from 12/06/11.  The INR has risen despite the last Coumadin dose given 9/8.  Patient has a scheduled EGD when INR < 2.  MD plans to give dose Vit K to facilitate a timely study.  Goals:  Target INR of 2-3.  Plan:  Will Hold Coumadin for planned EGD.  Vitamin K today per MD.  Daily INR's, CBC.     Tracy [name], Elisha Headland, Pharm. D. 12/07/2011, 12:19 PM

## 2011-12-07 NOTE — Progress Notes (Signed)
Patient ID: Tracy [name], male   DOB: [birth year], 78 y.o.   MRN: [id number]    Subjective:  Denies SSCP, palpitations or Dyspnea   Objective:  Filed Vitals:   12/06/11 1000 12/06/11 1400 12/06/11 2007 12/07/11 0546  BP: 147/91 142/75 148/86 132/81  Pulse: 93 82 86 76  Temp: 98 F (36.7 C)  98.3 F (36.8 C) 98.7 F (37.1 C)  TempSrc: Oral  Oral Oral  Resp: 16 16 20 18   Height:      Weight:   157 lb 6.5 oz (71.4 kg)   SpO2: 90% 91% 92% 92%    Intake/Output from previous day:  Intake/Output Summary (Last 24 hours) at 12/07/11 0819 Last data filed at 12/07/11 0700  Gross per 24 hour  Intake   1440 ml  Output    675 ml  Net    765 ml    Physical Exam: Affect appropriate Elderly frail male HEENT: Poor dentition Neck supple with no adenopathy JVP normal no bruits no thyromegaly Lungs clear with no wheezing and good diaphragmatic motion Heart:  S1/S2 no murmur, no rub, gallop or click PMI normal Abdomen: benighn, BS positve, no tenderness, no AAA no bruit.  No HSM or HJR Distal pulses intact with no bruits No edema Neuro non-focal Skin warm and dry No muscular weakness   Lab Results: Basic Metabolic Panel:  Basename 12/07/11 0617 12/06/11 0919  NA 138 136  K 3.2* 3.6  CL 108 102  CO2 21 20  GLUCOSE 88 99  BUN 35* 38*  CREATININE 2.91* 2.89*  CALCIUM 8.9 10.0  MG -- --  PHOS -- --   Liver Function Tests:  [facility] 12/05/11 0515  AST 25  ALT 17  ALKPHOS 60  BILITOT 0.8  PROT 7.0  ALBUMIN 3.6   No results found for this basename: LIPASE:2,AMYLASE:2 in the last 72 hours CBC:  Basename 12/07/11 0617 12/06/11 1630  WBC 4.2 4.7  NEUTROABS -- --  HGB 8.9* 9.7*  HCT 29.1* 32.9*  MCV 96.7 98.8  PLT 75* 97*   Cardiac Enzymes:  Basename 12/06/11 1847 12/06/11 1321  CKTOTAL -- --  CKMB -- --  CKMBINDEX -- --  TROPONINI 0.53* 0.49*     Imaging: Mr Mrcp  12/06/2011  *RADIOLOGY REPORT*  Clinical Data:  Weight loss, dilated common duct and  pancreatic duct on prior exam and possible common duct stone.  Anorexia.  MRI ABDOMEN WITHOUT CONTRAST (MRCP)  Technique: Multiplanar multisequence MR imaging of the abdomen was performed, including heavily T2-weighted images of the biliary and pancreatic ducts.  Three-dimensional MR images were rendered by post processing of the original MR data.  Comparison:  CT abdomen/pelvis 10/13/2011  Findings:  Trace perinephric stranding and bilateral T2 hyperintense renal cortical cysts are again noted, largest left lower renal pole 3.5 cm.  These are stable allowing for differences in technique.  No hydronephrosis.  Common duct is mildly dilated measuring 1.1 cm at its midportion.  No pancreatic ductal dilatation.  T2 hyperintense too small to characterize 4 mm hyperintense focus left hepatic lobe lateral segment image 24 series 3 is most likely a cyst or possibly biliary hamartoma.  This may be too small to be previously visualized.  Spleen, adrenal glands, pancreas, and gallbladder are unremarkable.  No lymphadenopathy or ascites.  Disc degenerative changes are noted in the spine.  Some series are degraded by patient respiratory motion. Allowing for this, on heavily T2-weighted images, there is no focal filling defect within the common  duct.  There is gradual tapering to the level of the ampulla.  The liver is markedly hypointense on T1 and T2-weighted imaging which may indicate iron deposition.  No lymphadenopathy.  IMPRESSION: Mild common duct dilatation to 1 cm without filling defect allowing for mild motion artifact.  Gradual tapering to the ampulla is noted and there is no intrahepatic ductal dilatation.  Hepatic hypointensity which may indicate iron deposition.   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  No secondary imaging findings to suggest cirrhosis.   Original Report Authenticated By: Harrel Lemon, M.D.     Cardiac Studies:  ECG:  Afib no actue ischemic  changes   Telemetry:  Afib rate 90-100 this am  Echo:   Medications:     . azithromycin  250 mg Oral Daily  . calcitonin  300 Units Intramuscular BID  . cefUROXime  500 mg Oral BID WC  . cloNIDine  0.1 mg Oral Daily  . furosemide  40 mg Oral BID  . isosorbide mononitrate  30 mg Oral QPM  . metoprolol  50 mg Oral QHS  . pantoprazole  40 mg Oral BID AC  . Tamsulosin HCl  0.8 mg Oral QHS  . Warfarin - Pharmacist Dosing Inpatient   Does not apply q1800  . DISCONTD: ciprofloxacin  400 mg Intravenous 60 min Pre-Op       . sodium chloride 100 mL/hr (12/07/11 0349)  . sodium chloride      Assessment/Plan:  CAD: Insignificant elevation in enzymes with no chest pain and no acute ECG changes. Continue medical Rx nitrates and beta blocker  AFib: Rate control is fine Coumadin being held for EGD per GI. Dont think he needs heparin/coumadin overlap.  Daistolic CHF: Seems dry to euvolemic On lasix bid and would not increase given CRF  CRF: Per Dr Caryn Section Rx elevated Calcium and continue aranasp as needed Hb now 34 despit ? Hematemesis  Confusion: Seems to be resolving No focal neuro signs Calcium 15->10 improved Pneumonia:  Continue antibiotics  Mild cough no fever  Resume coumadin post EGD if no lesions found or biopsy done  Tracy [name] 12/07/2011, 8:19 AM

## 2011-12-07 NOTE — Progress Notes (Signed)
Brief History: 78 year old male he was admitted on 12/04/2011 after being called by his nephrologist, Dr. Caryn Section, with abnormal elevated calcium levels on the labs.  Patient has a history of atrial fibrillation on chronic anticoagulation.  Since being admitted hypercalcemia corrected.  Patient was also found to have community acquired pneumonia currently being treated on antibiotics.  On 12/06/2011 patient had hematemesis, workup pending at this time.  Consultants: Dr. Arrie Aran, [place] [facility]. Dr. Dulce Sellar, GI Dr. Eden Emms, Cardiology  Subjective: Feeling better today.  Per wife no slurring of speech, significantly improved and at baseline.  Patient tolerating good solid intake.  Objective: Vital signs in last 24 hours: Filed Vitals:   12/06/11 1000 12/06/11 1400 12/06/11 2007 12/07/11 0546  BP: 147/91 142/75 148/86 132/81  Pulse: 93 82 86 76  Temp: 98 F (36.7 C)  98.3 F (36.8 C) 98.7 F (37.1 C)  TempSrc: Oral  Oral Oral  Resp: 16 16 20 18   Height:      Weight:   71.4 kg (157 lb 6.5 oz)   SpO2: 90% 91% 92% 92%   Weight change:   Intake/Output Summary (Last 24 hours) at 12/07/11 1258 Last data filed at 12/07/11 1000  Gross per 24 hour  Intake   1920 ml  Output    850 ml  Net   1070 ml    Physical Exam: General: Awake, Oriented, No acute distress. HEENT: EOMI. Neck: Supple CV: S1 and S2 Lungs: Clear to ascultation bilaterally Abdomen: Soft, Nontender, Nondistended, +bowel sounds. Ext: Good pulses. Trace edema.  Lab Results: Basic Metabolic Panel:  Lab 12/07/11 1610 12/06/11 0919 12/05/11 0515 12/04/11 9604 12/03/11 2353 12/03/11 1730 12/03/11 1430  NA 138 136 135 136 -- 135 --  K 3.2* 3.6 4.2 4.0 -- 4.7 --  CL 108 102 102 101 -- 101 --  CO2 21 20 21 23  -- 23 --  GLUCOSE 88 99 112* 97 -- 126* --  BUN 35* 38* 45* 48* -- 51* --  CREATININE 2.91* 2.89* 3.12* 3.31* -- 3.43* --  CALCIUM 8.9 10.0 11.8* 14.3* -- >15.0* --  MG -- -- -- -- -- -- --  PHOS -- -- -- --  6.7* -- 6.5*   Liver Function Tests:  Lab 12/05/11 0515 12/03/11 2353 12/03/11 1430  AST 25 20 --  ALT 17 17 --  ALKPHOS 60 72 --  BILITOT 0.8 1.0 --  PROT 7.0 7.4 --  ALBUMIN 3.6 3.8 3.9   No results found for this basename: LIPASE:5,AMYLASE:5 in the last 168 hours No results found for this basename: AMMONIA:5 in the last 168 hours CBC:  Lab 12/07/11 0617 12/06/11 1630 12/05/11 0515 12/04/11 0655 12/03/11 1730  WBC 4.2 4.7 4.9 5.9 3.1*  NEUTROABS -- -- -- -- --  HGB 8.9* 9.7* 10.6* 11.8* 11.5*  HCT 29.1* 32.9* 34.2* 38.2* 37.7*  MCV 96.7 98.8 95.3 95.7 95.9  PLT 75* 97* 122* 130* 132*   Cardiac Enzymes:  Lab 12/06/11 1847 12/06/11 1321  CKTOTAL -- --  CKMB -- --  CKMBINDEX -- --  TROPONINI 0.53* 0.49*   BNP (last 3 results)  Basename 08/04/11 1241 02/16/11 1044  PROBNP 601.0* 3945.0*   CBG: No results found for this basename: GLUCAP:5 in the last 168 hours No results found for this basename: HGBA1C:5 in the last 72 hours Other Labs: No components found with this basename: POCBNP:3 No results found for this basename: DDIMER:2 in the last 168 hours No results found for this basename: CHOL:2,HDL:2,LDLCALC:2,TRIG:2,CHOLHDL:2,LDLDIRECT:2 in the  last 168 hours No results found for this basename: TSH,T4TOTAL,FREET3,T3FREE,FREET4,THYROIDAB in the last 168 hours  Lab 12/03/11 1430  VITAMINB12 --  FOLATE --  FERRITIN 87  TIBC 274  IRON 165*  RETICCTPCT --    Micro Results: Recent Results (from the past 240 hour(s))  MRSA PCR SCREENING     Status: Normal   Collection Time   12/04/11  3:00 AM      Component Value Range Status Comment   MRSA by PCR NEGATIVE  NEGATIVE Final   CULTURE, BLOOD (ROUTINE X 2)     Status: Normal (Preliminary result)   Collection Time   12/04/11 11:10 AM      Component Value Range Status Comment   Specimen Description BLOOD RIGHT HAND   Final    Special Requests BOTTLES DRAWN AEROBIC ONLY 5CC   Final    Culture  Setup Time 12/04/2011 19:27    Final    Culture     Final    Value:        BLOOD CULTURE RECEIVED NO GROWTH TO DATE CULTURE WILL BE HELD FOR 5 DAYS BEFORE ISSUING A FINAL NEGATIVE REPORT   Report Status PENDING   Incomplete   CULTURE, BLOOD (ROUTINE X 2)     Status: Normal (Preliminary result)   Collection Time   12/04/11 11:15 AM      Component Value Range Status Comment   Specimen Description BLOOD LEFT HAND   Final    Special Requests BOTTLES DRAWN AEROBIC ONLY 5CC   Final    Culture  Setup Time 12/04/2011 19:27   Final    Culture     Final    Value:        BLOOD CULTURE RECEIVED NO GROWTH TO DATE CULTURE WILL BE HELD FOR 5 DAYS BEFORE ISSUING A FINAL NEGATIVE REPORT   Report Status PENDING   Incomplete   CLOSTRIDIUM DIFFICILE BY PCR     Status: Normal   Collection Time   12/06/11 12:37 AM      Component Value Range Status Comment   C difficile by pcr NEGATIVE  NEGATIVE Final     Studies/Results: Mr Mrcp  12/06/2011  *RADIOLOGY REPORT*  Clinical Data:  Weight loss, dilated common duct and pancreatic duct on prior exam and possible common duct stone.  Anorexia.  MRI ABDOMEN WITHOUT CONTRAST (MRCP)  Technique: Multiplanar multisequence MR imaging of the abdomen was performed, including heavily T2-weighted images of the biliary and pancreatic ducts.  Three-dimensional MR images were rendered by post processing of the original MR data.  Comparison:  CT abdomen/pelvis 10/13/2011  Findings:  Trace perinephric stranding and bilateral T2 hyperintense renal cortical cysts are again noted, largest left lower renal pole 3.5 cm.  These are stable allowing for differences in technique.  No hydronephrosis.  Common duct is mildly dilated measuring 1.1 cm at its midportion.  No pancreatic ductal dilatation.  T2 hyperintense too small to characterize 4 mm hyperintense focus left hepatic lobe lateral segment image 24 series 3 is most likely a cyst or possibly biliary hamartoma.  This may be too small to be previously visualized.  Spleen,  adrenal glands, pancreas, and gallbladder are unremarkable.  No lymphadenopathy or ascites.  Disc degenerative changes are noted in the spine.  Some series are degraded by patient respiratory motion. Allowing for this, on heavily T2-weighted images, there is no focal filling defect within the common duct.  There is gradual tapering to the level of the ampulla.  The liver  is markedly hypointense on T1 and T2-weighted imaging which may indicate iron deposition.  No lymphadenopathy.  IMPRESSION: Mild common duct dilatation to 1 cm without filling defect allowing for mild motion artifact.  Gradual tapering to the ampulla is noted and there is no intrahepatic ductal dilatation.  Hepatic hypointensity which may indicate iron deposition.   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  No secondary imaging findings to suggest cirrhosis.   Original Report Authenticated By: Harrel Lemon, M.D.     Medications: I have reviewed the patient's current medications. Scheduled Meds:    . atorvastatin  20 mg Oral q1800  . azithromycin  250 mg Oral Daily  . cefUROXime  500 mg Oral BID WC  . cloNIDine  0.1 mg Oral Daily  . furosemide  40 mg Oral BID  . isosorbide mononitrate  30 mg Oral QPM  . metoprolol  50 mg Oral QHS  . pantoprazole  40 mg Oral BID AC  . Tamsulosin HCl  0.8 mg Oral QHS  . Warfarin - Pharmacist Dosing Inpatient   Does not apply q1800   Continuous Infusions:    . sodium chloride Stopped (12/07/11 1004)  . DISCONTD: sodium chloride 100 mL/hr (12/07/11 0349)   PRN Meds:.acetaminophen, acetaminophen, hydrALAZINE, HYDROmorphone (DILAUDID) injection, loperamide, nitroGLYCERIN, ondansetron (ZOFRAN) IV, ondansetron, promethazine, promethazine, zolpidem, DISCONTD: alum & mag hydroxide-simeth  Assessment/Plan: Hypercalcemia Etiology unclear, resolved. Patient reports that he drinks 3 glasses of milk daily, (breakfast, afternoon, and at bedtime), continue to  hold milk. Received Pamidronate once on 12/04/2011. IV fluids and on continue oral lasix. Does not report taking Vit D at home. Vit D 25 low at 25 (30-89) and Vit D 1-25 pending.  PTH low at 12.5, PTH-related peptide pending. SPEP showed possibility of faint restricted bands cannot be completely excluded in the gamma region.  UPEP showed no monoclonal free light chains (Bence-Jones protein) are detected, urine IFE shows polyclonal increase in free The hand or free lambda light chains.  Discussed with renal (who discussed with on call oncology), has had CT of abd and pelvis in July and CT chest in May 2013, where no bony mets noted. Has had bone marrow biopsy in March of 2013, no signs of myeloma was commented.   Hematemesis Appreciate Dr. Hulen Shouts input.  Hemoglobin stable.  Patient on anticoagulation for A. Fib, will give the patient vitamin K 5 mg once to reverse anticoagulation to allow for INR less than 2.0 for EGD.  Elevated troponin Appreciate cardiology evaluation.  No further workup as per cardiology.  Community acquired pneumonia Initially on Ceftriaxone transitioned to cefuroxime and azithromycin. Antibiotics since 12/04/2011. Stable.  Defined 7 to ten-day course of antibiotics.  Several bilateral subpleural nodules in May of 2013 Will need outpatient followup for CT in 6 months. Dr. Vassie Loll following.  Hyperlipidemia Resume statin (patient on rosuvastatin not on medication list).  Hypertension Stable.  History of A. Fib Rate controlled. Coumadin per pharmacy.  Therapeutic INR.  Per cardiology does not need to be bridged with heparin.  CKD stage III Creatinine at baseline.  Stable.  Anemia and Thrombocytopenia (Pancytopenia) due to Myelodysplastic syndrome Had bone marrow biopsy in March of 2013, myelodysplastic syndrome, Dr. Shirline Frees. Patient chose conservative approach. Hemoglobin and platelet count stable.  Shortness of breath Improved.  May be due to generalized weakness and  deconditioning.  Per discussion with patient and wife appears to be chronic.  Last 2-D echocardiogram on November of 2012, wall thickness was increased  in a pattern of severe LVH, systolic function was normal, ejection fraction 50-55%, wall motion was normal there were no regional wall abnormalities.  Per discussion with patient and wife, patient may be in the process of getting pulmonary rehabilitation.    Coronary artery disease Stable.  Cachexia Encourage supplemental nutrition.  Diarrhea Cdiff PCR negative.  Start the patient on Imodium as needed for diarrhea.  Maybe due to antibiotics.  Continue to monitor.  Prophylaxis SCDs.  Disposition Pending GI evaluation.   LOS: 4 days  Marguis Mathieson A, MD 12/07/2011, 12:58 PM

## 2011-12-07 NOTE — Progress Notes (Signed)
Subjective: No nausea/vomiting overnight.  No hematemesis or melena.  Objective: Vital signs in last 24 hours: Temp:  [98 F (36.7 C)-98.7 F (37.1 C)] 98.7 F (37.1 C) (09/10 0546) Pulse Rate:  [76-93] 76  (09/10 0546) Resp:  [16-20] 18  (09/10 0546) BP: (132-148)/(75-91) 132/81 mmHg (09/10 0546) SpO2:  [90 %-92 %] 92 % (09/10 0546) Weight:  [71.4 kg (157 lb 6.5 oz)] 71.4 kg (157 lb 6.5 oz) (09/09 2007) Weight change:  Last BM Date: 12/06/11  PE: GEN:  NAD  Lab Results:  INR 2.75  CBC    Component Value Date/Time   WBC 4.2 12/07/2011 0617   WBC 3.6* 09/28/2011 0918   RBC 3.01* 12/07/2011 0617   RBC 2.71* 09/28/2011 0918   HGB 8.9* 12/07/2011 0617   HGB 8.7* 09/28/2011 0918   HCT 29.1* 12/07/2011 0617   HCT 27.6* 09/28/2011 0918   PLT 75* 12/07/2011 0617   PLT 107* 09/28/2011 0918   MCV 96.7 12/07/2011 0617   MCV 101.9* 09/28/2011 0918   MCH 29.6 12/07/2011 0617   MCH 32.0 09/28/2011 0918   MCHC 30.6 12/07/2011 0617   MCHC 31.4* 09/28/2011 0918   RDW 16.0* 12/07/2011 0617   RDW 18.4* 09/28/2011 0918   LYMPHSABS 0.8* 09/28/2011 0918   LYMPHSABS 0.7 08/04/2011 1241   MONOABS 0.6 09/28/2011 0918   MONOABS 0.4 08/04/2011 1241   EOSABS 0.1 09/28/2011 0918   EOSABS 0.1 08/04/2011 1241   BASOSABS 0.0 09/28/2011 0918   BASOSABS 0.0 08/04/2011 1241   Assessment:  1.  N/V with hematemesis.  Mallory Weiss tear and esophagitis are differential considerations. No problems overnight.  Plan:  1.  Continue PPI therapy. 2.  EGD once INR < 2, hopefully tomorrow.  Defer letting INR "drift down" or reversal to primary team. 3.  Will revisit tomorrow.   Freddy Jaksch 12/07/2011, 7:52 AM

## 2011-12-07 NOTE — Progress Notes (Signed)
Subjective: Feeling okay this morning, slept very well. Was able to tolerate about half of his dinner last night. Mental status in tact. No pain today. Plan for EGD this morning if INR < 2. INR is 2.75 so will be able to eat if no EGD today.   Objective Vital signs in last 24 hours: Filed Vitals:   12/06/11 1000 12/06/11 1400 12/06/11 2007 12/07/11 0546  BP: 147/91 142/75 148/86 132/81  Pulse: 93 82 86 76  Temp: 98 F (36.7 C)  98.3 F (36.8 C) 98.7 F (37.1 C)  TempSrc: Oral  Oral Oral  Resp: 16 16 20 18   Height:      Weight:   157 lb 6.5 oz (71.4 kg)   SpO2: 90% 91% 92% 92%   Weight change:   Intake/Output Summary (Last 24 hours) at 12/07/11 0801 Last data filed at 12/07/11 0600  Gross per 24 hour  Intake   1440 ml  Output    600 ml  Net    840 ml   Labs: Basic Metabolic Panel:  Lab 12/07/11 1610 12/06/11 0919 12/05/11 0515 12/04/11 0655 12/03/11 2353 12/03/11 1730 12/03/11 1430  NA 138 136 135 136 -- 135 135  K 3.2* 3.6 4.2 4.0 -- 4.7 4.6  CL 108 102 102 101 -- 101 101  CO2 21 20 21 23  -- 23 22  GLUCOSE 88 99 112* 97 -- 126* 118*  BUN 35* 38* 45* 48* -- 51* 51*  CREATININE 2.91* 2.89* 3.12* 3.31* -- 3.43* 3.40*  ALB -- -- -- -- -- -- --  CALCIUM 8.9 10.0 11.8* 14.3* -- >15.0* >15.0*  PHOS -- -- -- -- 6.7* -- 6.5*   Liver Function Tests:  Lab 12/05/11 0515 12/03/11 2353 12/03/11 1430  AST 25 20 --  ALT 17 17 --  ALKPHOS 60 72 --  BILITOT 0.8 1.0 --  PROT 7.0 7.4 --  ALBUMIN 3.6 3.8 3.9   CBC:  Lab 12/07/11 0617 12/06/11 1630 12/05/11 0515 12/04/11 0655  WBC 4.2 4.7 4.9 5.9  NEUTROABS -- -- -- --  HGB 8.9* 9.7* 10.6* 11.8*  HCT 29.1* 32.9* 34.2* 38.2*  MCV 96.7 98.8 95.3 95.7  PLT 75* 97* 122* 130*   Iron Studies:   Lab 12/03/11 1430  IRON 165*  TIBC 274  TRANSFERRIN --  FERRITIN 87    Physical Exam:  Blood pressure 132/81, pulse 76, temperature 98.7 F (37.1 C), temperature source Oral, resp. rate 18, height 6\' 1"  (1.854 m), weight 157 lb 6.5  oz (71.4 kg), SpO2 92.00%.  General: resting in bed, talking with the nurse when I enter HEENT: PERRL, EOMI, no scleral icterus Cardiac: RRR, no rubs, murmurs or gallops Pulm: clear to auscultation bilaterally, moving normal volumes of air Abd: soft, nontender, nondistended, BS present Ext: warm and well perfused, no pedal edema Neuro: alert and oriented X3, cranial nerves II-XII grossly intact  Assessment/Plan 1 Hypercalcemia- Could have vit D toxicity, levels pending, malignancy. PTH from Aug 9 is suppressed. SPEP and UPEP are pending.  MRCP unremarkable. Ca today 8.9. -Recommend stop lasix -Consult GI for endoscopy given hematemasis (primary team may need to contact pharmacy and let them know to hold coumadin) -TSH in process -Vit D 25 level low, Vit D 1,25 level still pending  2 Pancytopenia, s/p BM bx   3 BPH, due for TURP in Oct  4 Afib - Per primary team, INR 2.7 today.  5 HTN - Per primary team.  6 CAD - Per  primary team.  Genella Mech MD 12/07/2011, 8:01 AM PGY-2

## 2011-12-07 NOTE — Progress Notes (Signed)
PT Cancellation Note  Treatment cancelled today due to patient's refusal to participate.  Politely declining ambulation today, wanting to sleep;  Will retry tomorrow, Thanks,   Van Clines [facility] 12/07/2011, 3:43 PM

## 2011-12-07 NOTE — Progress Notes (Signed)
I have seen and examined this patient and agree with plan as outlined by Dr. Dorise Hiss.  Will decrease lasix to 40mg  po qd, continue to hold coumadin and await EGD.  Pt reports that he would be interested in dialysis if his life depended upon it.  Will order vein mapping and VVS eval to set up outpt AVF placement. Josemanuel Eakins A,MD 12/07/2011 8:55 AM

## 2011-12-08 ENCOUNTER — Inpatient Hospital Stay (HOSPITAL_COMMUNITY): Payer: Medicare Other

## 2011-12-08 DIAGNOSIS — R64 Cachexia: Secondary | ICD-10-CM

## 2011-12-08 DIAGNOSIS — N19 Unspecified kidney failure: Secondary | ICD-10-CM

## 2011-12-08 LAB — CBC
HCT: 29.7 % — ABNORMAL LOW (ref 39.0–52.0)
Hemoglobin: 9 g/dL — ABNORMAL LOW (ref 13.0–17.0)
MCH: 28.8 pg (ref 26.0–34.0)
MCHC: 30.3 g/dL (ref 30.0–36.0)
MCV: 95.2 fL (ref 78.0–100.0)
Platelets: 87 K/uL — ABNORMAL LOW (ref 150–400)
RBC: 3.12 MIL/uL — ABNORMAL LOW (ref 4.22–5.81)
RDW: 15.4 % (ref 11.5–15.5)
WBC: 4.6 K/uL (ref 4.0–10.5)

## 2011-12-08 LAB — MAGNESIUM: Magnesium: 1.7 mg/dL (ref 1.5–2.5)

## 2011-12-08 LAB — VITAMIN D 1,25 DIHYDROXY
Vitamin D2 1, 25 (OH)2: <8 pg/mL
Vitamin D3 1, 25 (OH)2: 80 pg/mL

## 2011-12-08 LAB — BASIC METABOLIC PANEL
CO2: 20 mEq/L (ref 19–32)
Calcium: 8.9 mg/dL (ref 8.4–10.5)
Chloride: 106 mEq/L (ref 96–112)
Potassium: 3.1 mEq/L — ABNORMAL LOW (ref 3.5–5.1)
Sodium: 138 mEq/L (ref 135–145)

## 2011-12-08 LAB — PROTIME-INR
INR: 2.04 — ABNORMAL HIGH (ref 0.00–1.49)
Prothrombin Time: 23.4 seconds — ABNORMAL HIGH (ref 11.6–15.2)

## 2011-12-08 MED ORDER — FUROSEMIDE 40 MG PO TABS
40.0000 mg | ORAL_TABLET | Freq: Every day | ORAL | Status: DC
  Administered 2011-12-09 – 2011-12-10 (×2): 40 mg via ORAL
  Filled 2011-12-08 (×2): qty 1

## 2011-12-08 MED ORDER — POTASSIUM CHLORIDE CRYS ER 20 MEQ PO TBCR
40.0000 meq | EXTENDED_RELEASE_TABLET | Freq: Once | ORAL | Status: AC
  Administered 2011-12-08: 40 meq via ORAL
  Filled 2011-12-08: qty 2

## 2011-12-08 MED ORDER — POTASSIUM CHLORIDE 10 MEQ/100ML IV SOLN
10.0000 meq | INTRAVENOUS | Status: AC
  Administered 2011-12-08 (×3): 10 meq via INTRAVENOUS
  Filled 2011-12-08 (×3): qty 100

## 2011-12-08 NOTE — Progress Notes (Signed)
VASCULAR LAB PRELIMINARY  PRELIMINARY  PRELIMINARY  PRELIMINARY  Right  Upper Extremity Vein Map    Cephalic  Segment Diameter  Comment  1. Axilla 2.67mm    2. Mid upper arm 3.15mm    3. Above AC 3.27mm    4. In [facility] 2.73mm    5. Below AC   3.67mm    6. Mid forearm 3.67mm    7. Wrist 2.27mm                    Basilic  Segment Diameter Depth Comment       2. Mid upper arm 1.90mm 3.21mm Enters brachial  3. Above AC 4.76mm 9.56mm   4. In [facility] 3.75mm 6.67mm Multiple branches  5. Below AC 3.65mm 1.49mm   6. Mid forearm 3.84mm 3.80mm   7. Wrist 2.41mm 1.41mm Runs extremely medial                     Left Upper Extremity Vein Map    Cephalic  Segment Diameter  Comment  1. Axilla 5.48mm    2. Mid upper arm 3.37mm    3. Above AC 3.42mm    4. In [facility] 3.47mm    5. Below AC 6.38mm    6. Mid forearm 3.49mm  Mild wall thickening  7. Wrist mm  Unable to evaluate due to IV placement                  Basilic  Segment Diameter Depth Comment       2. Mid upper arm 3.64mm 12.65mm Enters brachial  3. Above AC 3.35mm 8.3mm   4. In [facility] 5.85mm 6.37mm   5. Below AC mm mm Unable to follow   6. Mid forearm mm mm   7. Wrist mm mm                   No evidence of thrombus noted. All areas imaged were compressible with only mid thickening noted in a small area of the left cephalic  Tracy [name], RVS 12/08/2011, 10:50 AM     ,

## 2011-12-08 NOTE — Consult Note (Signed)
Pharmacy Consult-Anticoagulation  Pharmacy Consult:  78 y/o male is currently on Coumadin for atrial fibrillation .  Current Labs: Recent Labs  Basename 12/08/11 0530 12/07/11 0617 12/06/11 1630 12/06/11 0919   HGB 9.0* 8.9* -- --   HCT 29.7* 29.1* 32.9* --   PLT 87* 75* 97* --   INR 2.04* 2.75* -- 2.43*   Estimated Creatinine Clearance: 16.8 ml/min (by C-G formula based on Cr of 2.98).@  Assessment: INR is down following a single dose of Vit K 5 mg po x 1. H/H stable overnight.  Goal/Plan: INR goal is currently < 2 in preparation for EGD. Coumadin remains on Hold. Daily INR's, CBC.  Amaziah Raisanen, Elisha Headland, Pharm.D. 12/08/2011  1:15 PM

## 2011-12-08 NOTE — Progress Notes (Signed)
Physical Therapy Treatment Patient Details Name: Tracy [name] MRN: [id number] DOB: [birth year] Today's Date: 12/08/2011 Time: 4098-1191 PT Time Calculation (min): 24 min  PT Assessment / Plan / Recommendation Comments on Treatment Session  Definite improvements in cognition and balance;  Noted pt's O2 sats dropped to 85% (observed lowest) with amb on room air; Recovered to 92-93% with approx 3-5 minutes seated rest on room air  Pt reported he has an Outpt appt with Pulmonologist, Dr. Vassie Loll scheduled within the next few weeks    Follow Up Recommendations  Home health PT    Barriers to Discharge        Equipment Recommendations  Other (comment) May not need RW; will assess gait with cane next visit    Recommendations for Other Services    Frequency Min 3X/week   Plan Discharge plan remains appropriate    Precautions / Restrictions Precautions Precautions: Fall Precaution Comments: Fall risk is decreasing as pt's status improves; significantly less if he uses RW/or other assistive device   Pertinent Vitals/Pain See note in comments    Mobility  Transfers Transfers: Sit to Stand;Stand to Sit Sit to Stand: 5: Supervision;From chair/3-in-1 Stand to Sit: 5: Supervision;To chair/3-in-1 Details for Transfer Assistance: Cues to for control and to self-monitor for steadiness   Ambulation/Gait Ambulation/Gait Assistance: 4: Min guard (without physical contact) Ambulation Distance (Feet): 400 Feet Assistive device: None Ambulation/Gait Assistance Details: Clearly much improved; No difficulty avoiding obstacles; Amb without assistive device today; Noted 2-3 minor losses of balance, pt was able to recover without physical assist; Stiff gait, with Decr knee flexion in swing R and L, though greater on Right; Pt attributes this to not walking in a while Gait Pattern: Step-through pattern    Exercises     PT Diagnosis:    PT Problem List:   PT Treatment Interventions:     PT  Goals Acute Rehab PT Goals Time For Goal Achievement: 12/11/11 Potential to Achieve Goals: Good Pt will go Sit to Stand: with modified independence PT Goal: Sit to Stand - Progress: Progressing toward goal Pt will go Stand to Sit: with modified independence PT Goal: Stand to Sit - Progress: Progressing toward goal Pt will Ambulate: >150 feet;with supervision;with least restrictive assistive device PT Goal: Ambulate - Progress: Progressing toward goal  Visit Information  Last PT Received On: 12/08/11 Assistance Needed: +1    Subjective Data  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; Described feeling "quite out-of-it" when admitted, and that he feels much better today Patient Stated Goal: return home   Cognition  Arousal/Alertness: Awake/alert Orientation Level: Appears intact for tasks assessed Behavior During Session: [facility] for tasks performed Cognition - Other Comments: Cognition much improved    Balance  Considered Berg, but pt somewhat SOB post amb on room air  Balance Balance Assessed: Yes Static Standing Balance Single Leg Stance - Right Leg:  (less than 3 seconds; unable to try without UE support) Single Leg Stance - Left Leg:  (approx 5 sec before losing balance; tends to reach out for UE support)  End of Session PT - End of Session Equipment Utilized During Treatment: Gait belt Activity Tolerance: Patient tolerated treatment well Patient left: in chair;with call bell/phone within reach;with family/visitor present Nurse Communication: Mobility status (desat with amb on RA)   GP     Olen Pel [place], [place] [phone]  12/08/2011, 1:37 PM

## 2011-12-08 NOTE — Progress Notes (Signed)
I have seen and examined this patient and agree with plan as outlined by Dr. Dorise Hiss.  Will decrease Lasix to once daily.  Await EGD.  Cont to hold coumadin.  Vein mapping and will need VVS eval.  Hypokalemia, replete and follow. Mikell Camp A,MD 12/08/2011 9:12 AM

## 2011-12-08 NOTE — Progress Notes (Signed)
INR still > 2.  Will recheck tomorrow.  EGD once INR < 2, hopefully tomorrow.

## 2011-12-08 NOTE — Progress Notes (Signed)
Brief History: 78 year old male he was admitted on 12/04/2011 after being called by his nephrologist, Dr. Caryn Section, with abnormal elevated calcium levels on the labs.  Patient has a history of atrial fibrillation on chronic anticoagulation.  Since being admitted hypercalcemia corrected.  Patient was also found to have community acquired pneumonia currently being treated on antibiotics.  On 12/06/2011 patient had hematemesis, workup pending at this time.    Consultants: Dr. Arrie Aran, [place] [facility]. Dr. Dulce Sellar, GI Dr. Eden Emms, Cardiology  Subjective: .Patient became short of breath on walking and oxygen saturation dropped to 85%  Objective: Vital signs in last 24 hours: Filed Vitals:   12/07/11 1726 12/07/11 2122 12/08/11 0559 12/08/11 1341  BP: 136/81 173/96 131/69 111/62  Pulse: 84 94 86 81  Temp: 98.3 F (36.8 C) 97.9 F (36.6 C) 98.3 F (36.8 C)   TempSrc: Oral Oral Oral   Resp:  18 18 18   Height:      Weight:  72 kg (158 lb 11.7 oz)    SpO2: 96% 93% 90% 95%   Weight change: 0.6 kg (1 lb 5.2 oz)  Intake/Output Summary (Last 24 hours) at 12/08/11 1535 Last data filed at 12/08/11 1300  Gross per 24 hour  Intake    360 ml  Output   2552 ml  Net  -2192 ml    Physical Exam: General: Awake, Oriented, No acute distress. HEENT: EOMI. Neck: Supple CV: S1 and S2 Lungs: Clear to ascultation bilaterally Abdomen: Soft, Nontender, Nondistended, +bowel sounds. Ext: Good pulses. Trace edema.  Lab Results: Basic Metabolic Panel:  Lab 12/08/11 1610 12/07/11 0617 12/06/11 0919 12/05/11 0515 12/04/11 0655 12/03/11 2353 12/03/11 1430  NA 138 138 136 135 136 -- --  K 3.1* 3.2* 3.6 4.2 4.0 -- --  CL 106 108 102 102 101 -- --  CO2 20 21 20 21 23  -- --  GLUCOSE 89 88 99 112* 97 -- --  BUN 35* 35* 38* 45* 48* -- --  CREATININE 2.98* 2.91* 2.89* 3.12* 3.31* -- --  CALCIUM 8.9 8.9 10.0 11.8* 14.3* -- --  MG -- -- -- -- -- -- --  PHOS -- -- -- -- -- 6.7* 6.5*   Liver Function  Tests:  Lab 12/05/11 0515 12/03/11 2353 12/03/11 1430  AST 25 20 --  ALT 17 17 --  ALKPHOS 60 72 --  BILITOT 0.8 1.0 --  PROT 7.0 7.4 --  ALBUMIN 3.6 3.8 3.9   No results found for this basename: LIPASE:5,AMYLASE:5 in the last 168 hours No results found for this basename: AMMONIA:5 in the last 168 hours CBC:  Lab 12/08/11 0530 12/07/11 0617 12/06/11 1630 12/05/11 0515 12/04/11 0655  WBC 4.6 4.2 4.7 4.9 5.9  NEUTROABS -- -- -- -- --  HGB 9.0* 8.9* 9.7* 10.6* 11.8*  HCT 29.7* 29.1* 32.9* 34.2* 38.2*  MCV 95.2 96.7 98.8 95.3 95.7  PLT 87* 75* 97* 122* 130*   Cardiac Enzymes:  Lab 12/06/11 1847 12/06/11 1321  CKTOTAL -- --  CKMB -- --  CKMBINDEX -- --  TROPONINI 0.53* 0.49*   BNP (last 3 results)  Basename 08/04/11 1241 02/16/11 1044  PROBNP 601.0* 3945.0*   CBG: No results found for this basename: GLUCAP:5 in the last 168 hours No results found for this basename: HGBA1C:5 in the last 72 hours Other Labs: No components found with this basename: POCBNP:3 No results found for this basename: DDIMER:2 in the last 168 hours No results found for this basename: CHOL:2,HDL:2,LDLCALC:2,TRIG:2,CHOLHDL:2,LDLDIRECT:2 in the last 168  hours  Lab 12/07/11 0617  TSH 1.262  T4TOTAL --  T3FREE --  FREET4 --  THYROIDAB --    Lab 12/03/11 1430  VITAMINB12 --  FOLATE --  FERRITIN 87  TIBC 274  IRON 165*  RETICCTPCT --    Micro Results: Recent Results (from the past 240 hour(s))  MRSA PCR SCREENING     Status: Normal   Collection Time   12/04/11  3:00 AM      Component Value Range Status Comment   MRSA by PCR NEGATIVE  NEGATIVE Final   CULTURE, BLOOD (ROUTINE X 2)     Status: Normal (Preliminary result)   Collection Time   12/04/11 11:10 AM      Component Value Range Status Comment   Specimen Description BLOOD RIGHT HAND   Final    Special Requests BOTTLES DRAWN AEROBIC ONLY 5CC   Final    Culture  Setup Time 12/04/2011 19:27   Final    Culture     Final    Value:         BLOOD CULTURE RECEIVED NO GROWTH TO DATE CULTURE WILL BE HELD FOR 5 DAYS BEFORE ISSUING A FINAL NEGATIVE REPORT   Report Status PENDING   Incomplete   CULTURE, BLOOD (ROUTINE X 2)     Status: Normal (Preliminary result)   Collection Time   12/04/11 11:15 AM      Component Value Range Status Comment   Specimen Description BLOOD LEFT HAND   Final    Special Requests BOTTLES DRAWN AEROBIC ONLY 5CC   Final    Culture  Setup Time 12/04/2011 19:27   Final    Culture     Final    Value:        BLOOD CULTURE RECEIVED NO GROWTH TO DATE CULTURE WILL BE HELD FOR 5 DAYS BEFORE ISSUING A FINAL NEGATIVE REPORT   Report Status PENDING   Incomplete   CLOSTRIDIUM DIFFICILE BY PCR     Status: Normal   Collection Time   12/06/11 12:37 AM      Component Value Range Status Comment   C difficile by pcr NEGATIVE  NEGATIVE Final     Studies/Results: No results found.  Medications: I have reviewed the patient's current medications. Scheduled Meds:    . atorvastatin  20 mg Oral q1800  . azithromycin  250 mg Oral Daily  . cefUROXime  500 mg Oral BID WC  . cloNIDine  0.1 mg Oral Daily  . furosemide  40 mg Oral Daily  . isosorbide mononitrate  30 mg Oral QPM  . metoprolol  50 mg Oral QHS  . pantoprazole  40 mg Oral BID AC  . phytonadione  5 mg Oral Once  . potassium chloride SA  40 mEq Oral Once  . Tamsulosin HCl  0.8 mg Oral QHS  . Warfarin - Pharmacist Dosing Inpatient   Does not apply q1800  . DISCONTD: furosemide  40 mg Oral BID   Continuous Infusions:    . sodium chloride Stopped (12/07/11 1004)   PRN Meds:.acetaminophen, acetaminophen, hydrALAZINE, HYDROmorphone (DILAUDID) injection, loperamide, nitroGLYCERIN, ondansetron (ZOFRAN) IV, ondansetron, promethazine, promethazine, zolpidem  Assessment/Plan: Hypercalcemia Etiology unclear, resolved. Patient reports that he drinks 3 glasses of milk daily, (breakfast, afternoon, and at bedtime), continue to hold milk. Received Pamidronate once on  12/04/2011. IV fluids and on continue oral lasix. Does not report taking Vit D at home. Vit D 25 low at 25 (30-89) and Vit D 1-25 pending.  PTH low  at 12.5, PTH-related peptide pending. SPEP showed possibility of faint restricted bands cannot be completely excluded in the gamma region.  UPEP showed no monoclonal free light chains (Bence-Jones protein) are detected, urine IFE shows polyclonal increase in free The hand or free lambda light chains.  Discussed with renal (who discussed with on call oncology), has had CT of abd and pelvis in July and CT chest in May 2013, where no bony mets noted. Has had bone marrow biopsy in March of 2013, no signs of myeloma was commented.   Hematemesis Appreciate Dr. Hulen Shouts input.  Hemoglobin stable.  Patient on anticoagulation for A. Fib, will give the patient vitamin K 5 mg once to reverse anticoagulation to allow for INR less than 2.0 for EGD.  Elevated troponin Appreciate cardiology evaluation.  No further workup as per cardiology.  Community acquired pneumonia Initially on Ceftriaxone transitioned to cefuroxime and azithromycin. Antibiotics since 12/04/2011. Stable.  Defined 7 to ten-day course of antibiotics.  Several bilateral subpleural nodules in May of 2013 Will need outpatient followup for CT in 6 months. Dr. Vassie Loll following.  Hyperlipidemia Resume statin (patient on rosuvastatin not on medication list).  Hypertension Stable.  History of A. Fib Rate controlled. Coumadin per pharmacy.  Therapeutic INR.  Per cardiology does not need to be bridged with heparin.  CKD stage III Creatinine at baseline.  Stable.  Anemia and Thrombocytopenia (Pancytopenia) due to Myelodysplastic syndrome Had bone marrow biopsy in March of 2013, myelodysplastic syndrome, Dr. Shirline Frees. Patient chose conservative approach. Hemoglobin and platelet count stable.  Shortness of breath Worse today on walking. He has diastolic CHF and has been getting lasix. Will obtain bnp.and  also CXR.  Coronary artery disease Stable.  Cachexia Encourage supplemental nutrition.  Diarrhea Cdiff PCR negative.  Start the patient on Imodium as needed for diarrhea.  Maybe due to antibiotics.  Continue to monitor.  Hypokalemia Will replace the potassium. Also check magnesium level  Prophylaxis SCDs.  Disposition Pending GI evaluation.   LOS: 5 days  Lucion Dilger S, MD 12/08/2011, 3:35 PM

## 2011-12-08 NOTE — Progress Notes (Signed)
Patient ID: Tracy [name], male   DOB: [birth year], 78 y.o.   MRN: [id number]    Subjective:  Denies SSCP, palpitations or Dyspnea   Objective:  Filed Vitals:   12/07/11 1620 12/07/11 1726 12/07/11 2122 12/08/11 0559  BP: 126/77 136/81 173/96 131/69  Pulse: 78 84 94 86  Temp:  98.3 F (36.8 C) 97.9 F (36.6 C) 98.3 F (36.8 C)  TempSrc:  Oral Oral Oral  Resp:   18 18  Height:      Weight:   158 lb 11.7 oz (72 kg)   SpO2:  96% 93% 90%    Intake/Output from previous day:  Intake/Output Summary (Last 24 hours) at 12/08/11 0841 Last data filed at 12/08/11 0547  Gross per 24 hour  Intake    660 ml  Output   2337 ml  Net  -1677 ml    Physical Exam: Affect appropriate Elderly frail male HEENT: Poor dentition Neck supple with no adenopathy JVP normal no bruits no thyromegaly Lungs clear with no wheezing and good diaphragmatic motion Heart:  S1/S2 no murmur, no rub, gallop or click PMI normal Abdomen: benighn, BS positve, no tenderness, no AAA no bruit.  No HSM or HJR Distal pulses intact with no bruits No edema Neuro non-focal Skin warm and dry No muscular weakness   Lab Results: Basic Metabolic Panel:  Basename 12/08/11 0530 12/07/11 0617  NA 138 138  K 3.1* 3.2*  CL 106 108  CO2 20 21  GLUCOSE 89 88  BUN 35* 35*  CREATININE 2.98* 2.91*  CALCIUM 8.9 8.9  MG -- --  PHOS -- --   Liver Function Tests: No results found for this basename: AST:2,ALT:2,ALKPHOS:2,BILITOT:2,PROT:2,ALBUMIN:2 in the last 72 hours No results found for this basename: LIPASE:2,AMYLASE:2 in the last 72 hours CBC:  Basename 12/08/11 0530 12/07/11 0617  WBC 4.6 4.2  NEUTROABS -- --  HGB 9.0* 8.9*  HCT 29.7* 29.1*  MCV 95.2 96.7  PLT 87* 75*   Cardiac Enzymes:  Basename 12/06/11 1847 12/06/11 1321  CKTOTAL -- --  CKMB -- --  CKMBINDEX -- --  TROPONINI 0.53* 0.49*    Imaging: No results found.  Cardiac Studies:  ECG:  Afib no actue ischemic changes   Telemetry:  Afib  rate 90-100 this am  Echo:   Medications:      . atorvastatin  20 mg Oral q1800  . azithromycin  250 mg Oral Daily  . cefUROXime  500 mg Oral BID WC  . cloNIDine  0.1 mg Oral Daily  . furosemide  40 mg Oral BID  . isosorbide mononitrate  30 mg Oral QPM  . metoprolol  50 mg Oral QHS  . pantoprazole  40 mg Oral BID AC  . phytonadione  5 mg Oral Once  . Tamsulosin HCl  0.8 mg Oral QHS  . Warfarin - Pharmacist Dosing Inpatient   Does not apply q1800        . sodium chloride Stopped (12/07/11 1004)  . DISCONTD: sodium chloride 100 mL/hr (12/07/11 0349)    Assessment/Plan:  CAD: Insignificant elevation in enzymes with no chest pain and no acute ECG changes. Continue medical Rx nitrates and beta blocker  AFib: Rate control is fine Coumadin being held for EGD per GI. Dont think he needs heparin/coumadin overlap.  Daistolic CHF: Seems dry to euvolemic On lasix bid and would not increase given CRF  Nephrology following CRF: Per Dr Caryn Section Rx elevated Calcium and continue aranasp as needed Hb now 34  despit ? Hematemesis  Confusion: Seems to be resolving No focal neuro signs Calcium 15->10 improved Pneumonia:  Continue antibiotics  Mild cough no fever  Resume coumadin post EGD if no lesions found or biopsy done  [name] 12/08/2011, 8:41 AM

## 2011-12-08 NOTE — Progress Notes (Signed)
Subjective: Feeling okay this morning, slept very well. Was sleeping when I entered. Mental status in tact. No pain today. Plan for EGD this morning if INR < 2. INR is 2.04 so hopefully will be able to do that. Hg stable this morning.   Objective Vital signs in last 24 hours: Filed Vitals:   12/07/11 1620 12/07/11 1726 12/07/11 2122 12/08/11 0559  BP: 126/77 136/81 173/96 131/69  Pulse: 78 84 94 86  Temp:  98.3 F (36.8 C) 97.9 F (36.6 C) 98.3 F (36.8 C)  TempSrc:  Oral Oral Oral  Resp:   18 18  Height:      Weight:   158 lb 11.7 oz (72 kg)   SpO2:  96% 93% 90%   Weight change: 1 lb 5.2 oz (0.6 kg)  Intake/Output Summary (Last 24 hours) at 12/08/11 0801 Last data filed at 12/08/11 0547  Gross per 24 hour  Intake    660 ml  Output   2337 ml  Net  -1677 ml   Labs: Basic Metabolic Panel:  Lab 12/08/11 1610 12/07/11 0617 12/06/11 0919 12/05/11 0515 12/04/11 0655 12/03/11 2353 12/03/11 1730 12/03/11 1430  NA 138 138 136 135 136 -- 135 135  K 3.1* 3.2* 3.6 4.2 4.0 -- 4.7 4.6  CL 106 108 102 102 101 -- 101 101  CO2 20 21 20 21 23  -- 23 22  GLUCOSE 89 88 99 112* 97 -- 126* 118*  BUN 35* 35* 38* 45* 48* -- 51* 51*  CREATININE 2.98* 2.91* 2.89* 3.12* 3.31* -- 3.43* 3.40*  ALB -- -- -- -- -- -- -- --  CALCIUM 8.9 8.9 10.0 11.8* 14.3* -- >15.0* >15.0*  PHOS -- -- -- -- -- 6.7* -- 6.5*   Liver Function Tests:  Lab 12/05/11 0515 12/03/11 2353 12/03/11 1430  AST 25 20 --  ALT 17 17 --  ALKPHOS 60 72 --  BILITOT 0.8 1.0 --  PROT 7.0 7.4 --  ALBUMIN 3.6 3.8 3.9   CBC:  Lab 12/08/11 0530 12/07/11 0617 12/06/11 1630 12/05/11 0515  WBC 4.6 4.2 4.7 4.9  NEUTROABS -- -- -- --  HGB 9.0* 8.9* 9.7* 10.6*  HCT 29.7* 29.1* 32.9* 34.2*  MCV 95.2 96.7 98.8 95.3  PLT 87* 75* 97* 122*   Iron Studies:   Lab 12/03/11 1430  IRON 165*  TIBC 274  TRANSFERRIN --  FERRITIN 87    Physical Exam:  Blood pressure 131/69, pulse 86, temperature 98.3 F (36.8 C), temperature source Oral,  resp. rate 18, height 6\' 1"  (1.854 m), weight 158 lb 11.7 oz (72 kg), SpO2 90.00%.  General: resting in bed, sleeping when I enter HEENT: PERRL, EOMI, no scleral icterus Cardiac: RRR, no rubs, murmurs or gallops Pulm: clear to auscultation bilaterally, moving normal volumes of air Abd: soft, nontender, nondistended, BS present Ext: warm and well perfused, no pedal edema Neuro: alert and oriented X3, cranial nerves II-XII grossly intact  Assessment/Plan 1 Hypercalcemia- Could have vit D toxicity, levels pending, malignancy. PTH from Aug 9 is suppressed. SPEP and UPEP are pending.  MRCP unremarkable. Ca today 8.9. -Consult GI for endoscopy today given INR 2.0 -TSH normal -Vit D 25 level low, Vit D 1,25 level still pending  2 Pancytopenia, s/p BM bx   3 BPH, due for TURP in Oct  4 Afib - Per primary team, INR 2.0 today.  5 HTN - Per primary team.  6 CAD - Per primary team.  Genella Mech MD 12/08/2011, 8:01  AM PGY-2

## 2011-12-09 ENCOUNTER — Encounter (HOSPITAL_COMMUNITY): Payer: Self-pay | Admitting: Gastroenterology

## 2011-12-09 ENCOUNTER — Encounter (HOSPITAL_COMMUNITY)
Admission: EM | Disposition: A | Discharge status: Home / Self Care | Payer: Self-pay | Source: Home / Self Care | Attending: Internal Medicine

## 2011-12-09 HISTORY — PX: ESOPHAGOGASTRODUODENOSCOPY: SHX5428

## 2011-12-09 LAB — RENAL FUNCTION PANEL
Albumin: 3.1 g/dL — ABNORMAL LOW (ref 3.5–5.2)
Calcium: 8.8 mg/dL (ref 8.4–10.5)
Chloride: 107 mEq/L (ref 96–112)
Creatinine, Ser: 3.05 mg/dL — ABNORMAL HIGH (ref 0.50–1.35)
GFR calc non Af Amer: 17 mL/min — ABNORMAL LOW (ref >90)

## 2011-12-09 LAB — PROTIME-INR: INR: 1.39 (ref 0.00–1.49)

## 2011-12-09 LAB — CBC
MCV: 95 fL (ref 78.0–100.0)
Platelets: 91 10*3/uL — ABNORMAL LOW (ref 150–400)
RDW: 15.6 % — ABNORMAL HIGH (ref 11.5–15.5)
WBC: 4.5 10*3/uL (ref 4.0–10.5)

## 2011-12-09 SURGERY — EGD (ESOPHAGOGASTRODUODENOSCOPY)
Anesthesia: Moderate Sedation

## 2011-12-09 MED ORDER — FENTANYL CITRATE 0.05 MG/ML IJ SOLN
INTRAMUSCULAR | Status: AC
  Filled 2011-12-09: qty 2

## 2011-12-09 MED ORDER — WARFARIN SODIUM 7.5 MG PO TABS
7.5000 mg | ORAL_TABLET | Freq: Once | ORAL | Status: AC
  Administered 2011-12-09: 7.5 mg via ORAL
  Filled 2011-12-09: qty 1

## 2011-12-09 MED ORDER — DIPHENHYDRAMINE HCL 50 MG/ML IJ SOLN
INTRAMUSCULAR | Status: AC
  Filled 2011-12-09: qty 1

## 2011-12-09 MED ORDER — MIDAZOLAM HCL 10 MG/2ML IJ SOLN
INTRAMUSCULAR | Status: DC | PRN
  Administered 2011-12-09: 1 mg via INTRAVENOUS
  Administered 2011-12-09: 2 mg via INTRAVENOUS

## 2011-12-09 MED ORDER — MIDAZOLAM HCL 5 MG/ML IJ SOLN
INTRAMUSCULAR | Status: AC
  Filled 2011-12-09: qty 2

## 2011-12-09 MED ORDER — BUTAMBEN-TETRACAINE-BENZOCAINE 2-2-14 % EX AERO
INHALATION_SPRAY | CUTANEOUS | Status: DC | PRN
  Administered 2011-12-09: 2 via TOPICAL

## 2011-12-09 NOTE — Op Note (Signed)
[facility] [address] [place], [address]   ENDOSCOPY PROCEDURE REPORT  PATIENT: Tracy [name], Tracy [name]  MR#: [id number] BIRTHDATE: 1919-12-01 , 89  yrs. old GENDER: Male ENDOSCOPIST: Willis Modena, MD REFERRED BY:  Genella Mech, M.D. PROCEDURE DATE:  12/09/2011 PROCEDURE:  EGD, diagnostic ASA CLASS:     Class III INDICATIONS:  nausea.   vomiting.   hematemesis. MEDICATIONS: Versed 3 mg IV TOPICAL ANESTHETIC: Cetacaine Spray  DESCRIPTION OF PROCEDURE: After the risks benefits and alternatives of the procedure were thoroughly explained, informed consent was obtained.  The XB-[id number] ([id number]) endoscope was introduced through the mouth and advanced to the second portion of the duodenum. Without limitations.  The instrument was slowly withdrawn as the mucosa was fully examined.     Findings: Normal esophagus; no esophagitis or Mallory-Weiss  tear.  Mild diffuse gastritis.  Some retained gastric contents, obsuring some views of the fundus/body.  Pylorus was normal without ulcer or gastric outlet obstruction.  Duodenum normal to second portion.              The scope was then withdrawn from the patient and the procedure completed.  ENDOSCOPIC IMPRESSION:     As above.  No lesion at risk for bleeding with anticoagulation was witnessed.  Nausea vomiting could be due to gastroparesis, though these symptoms have resolved.  RECOMMENDATIONS:     1.  Watch for potential complications of procedure. 2.  Protonix 40 mg once-a-day indefinitely, as long as patient stays on anticoagulation. 3.  OK to resume anticoagulation from GI perspective. 4.  Will sign-off; thank you for the consult; please call with any questions.  eSigned:  Willis Modena, MD 12/09/2011 10:09 AM   CC:

## 2011-12-09 NOTE — Progress Notes (Signed)
Subjective: Feeling okay this morning, slept very well. Was sleeping when I entered. Mental status in tact. No pain today. Plan for EGD this morning if INR < 2. INR was 2.04 and got more vitamin K so will be lower although value not drawn yet this morning. Had vein mapping yesterday. Hg stable this morning.   Objective Vital signs in last 24 hours: Filed Vitals:   12/08/11 0559 12/08/11 1341 12/08/11 2035 12/09/11 0454  BP: 131/69 111/62 141/77 128/61  Pulse: 86 81 70 81  Temp: 98.3 F (36.8 C)  98 F (36.7 C) 97.7 F (36.5 C)  TempSrc: Oral  Oral Oral  Resp: 18 18 18 18   Height:      Weight:   155 lb 13.8 oz (70.7 kg)   SpO2: 90% 95% 91% 90%   Weight change: -2 lb 13.9 oz (-1.3 kg)  Intake/Output Summary (Last 24 hours) at 12/09/11 0747 Last data filed at 12/09/11 0600  Gross per 24 hour  Intake 491.33 ml  Output   1150 ml  Net -658.67 ml   Labs: Basic Metabolic Panel:  Lab 12/09/11 1610 12/08/11 0530 12/07/11 0617 12/06/11 0919 12/05/11 0515 12/04/11 0655 12/03/11 2353 12/03/11 1730 12/03/11 1430  NA 139 138 138 136 135 136 -- 135 --  K 3.6 3.1* 3.2* 3.6 4.2 4.0 -- 4.7 --  CL 107 106 108 102 102 101 -- 101 --  CO2 21 20 21 20 21 23  -- 23 --  GLUCOSE 97 89 88 99 112* 97 -- 126* --  BUN 35* 35* 35* 38* 45* 48* -- 51* --  CREATININE 3.05* 2.98* 2.91* 2.89* 3.12* 3.31* -- 3.43* --  ALB -- -- -- -- -- -- -- -- --  CALCIUM 8.8 8.9 8.9 10.0 11.8* 14.3* -- >15.0* --  PHOS 2.4 -- -- -- -- -- 6.7* -- 6.5*   Liver Function Tests:  Lab 12/09/11 0650 12/05/11 0515 12/03/11 2353  AST -- 25 20  ALT -- 17 17  ALKPHOS -- 60 72  BILITOT -- 0.8 1.0  PROT -- 7.0 7.4  ALBUMIN 3.1* 3.6 3.8   CBC:  Lab 12/09/11 0650 12/08/11 0530 12/07/11 0617 12/06/11 1630  WBC 4.5 4.6 4.2 4.7  NEUTROABS -- -- -- --  HGB 9.2* 9.0* 8.9* 9.7*  HCT 30.4* 29.7* 29.1* 32.9*  MCV 95.0 95.2 96.7 98.8  PLT 91* 87* 75* 97*   Iron Studies:   Lab 12/03/11 1430  IRON 165*  TIBC 274  TRANSFERRIN --    FERRITIN 87    Physical Exam:  Blood pressure 128/61, pulse 81, temperature 97.7 F (36.5 C), temperature source Oral, resp. rate 18, height 6\' 1"  (1.854 m), weight 155 lb 13.8 oz (70.7 kg), SpO2 90.00%.  General: resting in bed, sleeping when I enter HEENT: PERRL, EOMI, no scleral icterus Cardiac: RRR, no rubs, murmurs or gallops Pulm: clear to auscultation bilaterally, moving normal volumes of air Abd: soft, nontender, nondistended, BS present Ext: warm and well perfused, no pedal edema Neuro: alert and oriented X3, cranial nerves II-XII grossly intact  Assessment/Plan 1 Hypercalcemia- Could have vit D toxicity, levels pending, malignancy. PTH from Aug 9 is suppressed. SPEP showed possibility of faint restricted bands cannot be completely excluded in the gamma region. UPEP showed no monoclonal free light chains (Bence-Jones protein) are detected. MRCP unremarkable. Ca today 8.8.  -Consult GI for endoscopy -TSH normal -Vit D 25 level low, Vit D 1,25 level mildly elevated (normal upper range 72, his 80) -PTHrP  still pending  2 Pancytopenia, s/p BM bx   3 BPH, due for TURP in Oct  4 Afib - Per primary team, INR 2.0 yesterday.  5 HTN - Per primary team. BP 120s/60s.  6 CAD - Per primary team.  Genella Mech MD 12/09/2011, 7:47 AM PGY-2

## 2011-12-09 NOTE — Interval H&P Note (Signed)
History and Physical Interval Note:  12/09/2011 9:25 AM  Tracy [name]  has presented today for surgery, with the diagnosis of hematemesis  The various methods of treatment have been discussed with the patient and family. After consideration of risks, benefits and other options for treatment, the patient has consented to  Procedure(s) (LRB) with comments: ESOPHAGOGASTRODUODENOSCOPY (EGD) (N/A) - Mccartney Brucks/ebp as a surgical intervention .  The patient's history has been reviewed, patient examined, no change in status, stable for surgery.  I have reviewed the patient's chart and labs.  Questions were answered to the patient's satisfaction.     Tracy [name] M  Assessment:  1.  Nausea and vomiting. 2.  Hematemesis.   3.  "Dark" stool.  Plan:  1.  Endoscopy today. 2.  Risks (bleeding, infection, bowel perforation that could require surgery, sedation-related changes in cardiopulmonary systems), benefits (identification and possible treatment of source of symptoms, exclusion of certain causes of symptoms), and alternatives (watchful waiting, radiographic imaging studies, empiric medical treatment) of upper endoscopy (EGD) were explained to patient in detail and patient wishes to proceed.

## 2011-12-09 NOTE — Progress Notes (Signed)
Brief History: 78 year old male he was admitted on 12/04/2011 after being called by his nephrologist, Dr. Caryn Section, with abnormal elevated calcium levels on the labs.  Patient has a history of atrial fibrillation on chronic anticoagulation.  Since being admitted hypercalcemia corrected.  Patient was also found to have community acquired pneumonia currently being treated on antibiotics.  On 12/06/2011 patient had hematemesis,EGD done today and it is normal.     Consultants: Dr. Arrie Aran, [place] [facility]. Dr. Dulce Sellar, GI Dr. Eden Emms, Cardiology  Subjective: .Patient breathing is better, off oxygen.   Objective: Vital signs in last 24 hours: Filed Vitals:   12/09/11 1010 12/09/11 1020 12/09/11 1030 12/09/11 1048  BP: 128/66 127/73 129/63 150/90  Pulse:    86  Temp:    97.6 F (36.4 C)  TempSrc:      Resp: 19 18 18 17   Height:      Weight:      SpO2: 94% 94% 92% 97%   Weight change: -1.3 kg (-2 lb 13.9 oz)  Intake/Output Summary (Last 24 hours) at 12/09/11 1404 Last data filed at 12/09/11 1100  Gross per 24 hour  Intake 251.33 ml  Output    600 ml  Net -348.67 ml    Physical Exam: General: Awake, Oriented, No acute distress. HEENT: EOMI. Neck: Supple CV: S1 and S2 Lungs: Clear to ascultation bilaterally Abdomen: Soft, Nontender, Nondistended, +bowel sounds. Ext: Good pulses. Trace edema.  Lab Results: Basic Metabolic Panel:  Lab 12/09/11 1610 12/08/11 1600 12/08/11 0530 12/07/11 0617 12/06/11 0919 12/05/11 0515 12/03/11 2353 12/03/11 1430  NA 139 -- 138 138 136 135 -- --  K 3.6 -- 3.1* 3.2* 3.6 4.2 -- --  CL 107 -- 106 108 102 102 -- --  CO2 21 -- 20 21 20 21  -- --  GLUCOSE 97 -- 89 88 99 112* -- --  BUN 35* -- 35* 35* 38* 45* -- --  CREATININE 3.05* -- 2.98* 2.91* 2.89* 3.12* -- --  CALCIUM 8.8 -- 8.9 8.9 10.0 11.8* -- --  MG -- 1.7 -- -- -- -- -- --  PHOS 2.4 -- -- -- -- -- 6.7* 6.5*   Liver Function Tests:  Lab 12/09/11 0650 12/05/11 0515 12/03/11 2353  12/03/11 1430  AST -- 25 20 --  ALT -- 17 17 --  ALKPHOS -- 60 72 --  BILITOT -- 0.8 1.0 --  PROT -- 7.0 7.4 --  ALBUMIN 3.1* 3.6 3.8 3.9   No results found for this basename: LIPASE:5,AMYLASE:5 in the last 168 hours No results found for this basename: AMMONIA:5 in the last 168 hours CBC:  Lab 12/09/11 0650 12/08/11 0530 12/07/11 0617 12/06/11 1630 12/05/11 0515  WBC 4.5 4.6 4.2 4.7 4.9  NEUTROABS -- -- -- -- --  HGB 9.2* 9.0* 8.9* 9.7* 10.6*  HCT 30.4* 29.7* 29.1* 32.9* 34.2*  MCV 95.0 95.2 96.7 98.8 95.3  PLT 91* 87* 75* 97* 122*   Cardiac Enzymes:  Lab 12/06/11 1847 12/06/11 1321  CKTOTAL -- --  CKMB -- --  CKMBINDEX -- --  TROPONINI 0.53* 0.49*   BNP (last 3 results)  Basename 12/08/11 1600 08/04/11 1241 02/16/11 1044  PROBNP 9654.0* 601.0* 3945.0*   CBG: No results found for this basename: GLUCAP:5 in the last 168 hours No results found for this basename: HGBA1C:5 in the last 72 hours Other Labs: No components found with this basename: POCBNP:3 No results found for this basename: DDIMER:2 in the last 168 hours No results found for this basename:  CHOL:2,HDL:2,LDLCALC:2,TRIG:2,CHOLHDL:2,LDLDIRECT:2 in the last 168 hours  Lab 12/07/11 0617  TSH 1.262  T4TOTAL --  T3FREE --  FREET4 --  THYROIDAB --    Lab 12/03/11 1430  VITAMINB12 --  FOLATE --  FERRITIN 87  TIBC 274  IRON 165*  RETICCTPCT --    Micro Results: Recent Results (from the past 240 hour(s))  MRSA PCR SCREENING     Status: Normal   Collection Time   12/04/11  3:00 AM      Component Value Range Status Comment   MRSA by PCR NEGATIVE  NEGATIVE Final   CULTURE, BLOOD (ROUTINE X 2)     Status: Normal (Preliminary result)   Collection Time   12/04/11 11:10 AM      Component Value Range Status Comment   Specimen Description BLOOD RIGHT HAND   Final    Special Requests BOTTLES DRAWN AEROBIC ONLY 5CC   Final    Culture  Setup Time 12/04/2011 19:27   Final    Culture     Final    Value:         BLOOD CULTURE RECEIVED NO GROWTH TO DATE CULTURE WILL BE HELD FOR 5 DAYS BEFORE ISSUING A FINAL NEGATIVE REPORT   Report Status PENDING   Incomplete   CULTURE, BLOOD (ROUTINE X 2)     Status: Normal (Preliminary result)   Collection Time   12/04/11 11:15 AM      Component Value Range Status Comment   Specimen Description BLOOD LEFT HAND   Final    Special Requests BOTTLES DRAWN AEROBIC ONLY 5CC   Final    Culture  Setup Time 12/04/2011 19:27   Final    Culture     Final    Value:        BLOOD CULTURE RECEIVED NO GROWTH TO DATE CULTURE WILL BE HELD FOR 5 DAYS BEFORE ISSUING A FINAL NEGATIVE REPORT   Report Status PENDING   Incomplete   CLOSTRIDIUM DIFFICILE BY PCR     Status: Normal   Collection Time   12/06/11 12:37 AM      Component Value Range Status Comment   C difficile by pcr NEGATIVE  NEGATIVE Final     Studies/Results: Dg Chest 2 View  12/08/2011  *RADIOLOGY REPORT*  Clinical Data: Pneumonia.  Short of breath.  Previous myocardial infarct.  Atrial fibrillation.  CHEST - 2 VIEW  Comparison: 12/04/2011  Findings: Changes of COPD are again seen.  Mild cardiomegaly stable.  Left lower lobe airspace disease shows no significant change, consistent with pneumonia.  Right lung is clear.  No evidence of pleural effusion.  IMPRESSION:  1. No significant change in left lower lobe airspace disease, consistent with pneumonia. 2.  Stable mild cardiomegaly and COPD.   Original Report Authenticated By: Danae Orleans, M.D.     Medications: I have reviewed the patient's current medications. Scheduled Meds:    . atorvastatin  20 mg Oral q1800  . azithromycin  250 mg Oral Daily  . cefUROXime  500 mg Oral BID WC  . cloNIDine  0.1 mg Oral Daily  . furosemide  40 mg Oral Daily  . isosorbide mononitrate  30 mg Oral QPM  . metoprolol  50 mg Oral QHS  . pantoprazole  40 mg Oral BID AC  . potassium chloride  10 mEq Intravenous Q1 Hr x 3  . Tamsulosin HCl  0.8 mg Oral QHS  . warfarin  7.5 mg Oral  ONCE-1800  . Warfarin - Pharmacist  Dosing Inpatient   Does not apply q1800   Continuous Infusions:    . DISCONTD: sodium chloride 500 mL (12/09/11 0847)   PRN Meds:.acetaminophen, acetaminophen, hydrALAZINE, HYDROmorphone (DILAUDID) injection, loperamide, nitroGLYCERIN, ondansetron (ZOFRAN) IV, ondansetron, promethazine, promethazine, zolpidem, DISCONTD: butamben-tetracaine-benzocaine, DISCONTD: midazolam  Assessment/Plan: Hypercalcemia Etiology unclear, resolved. Patient reports that he drinks 3 glasses of milk daily, (breakfast, afternoon, and at bedtime), continue to hold milk. Received Pamidronate once on 12/04/2011. IV fluids and on continue oral lasix. Does not report taking Vit D at home. Vit D 25 low at 25 (30-89) and Vit D 1-25 pending.  PTH low at 12.5, PTH-related peptide pending. SPEP showed possibility of faint restricted bands cannot be completely excluded in the gamma region.  UPEP showed no monoclonal free light chains (Bence-Jones protein) are detected, urine IFE shows polyclonal increase in free The hand or free lambda light chains.  Discussed with renal (who discussed with on call oncology), has had CT of abd and pelvis in July and CT chest in May 2013, where no bony mets noted. Has had bone marrow biopsy in March of 2013, no signs of myeloma was commented.   Hematemesis Appreciate Dr. Hulen Shouts input.  Hemoglobin stable.  Patient on anticoagulation for A. Fib, EGD was done today and the results are normal.   Elevated troponin Appreciate cardiology evaluation.  No further workup as per cardiology.  Community acquired pneumonia Initially on Ceftriaxone transitioned to cefuroxime and azithromycin. Antibiotics since 12/04/2011. Stable.  Defined 7 to ten-day course of antibiotics.  Several bilateral subpleural nodules in May of 2013 Will need outpatient followup for CT in 6 months. Dr. Vassie Loll following.  Hyperlipidemia Resume statin (patient on rosuvastatin not on medication  list).  Hypertension Stable.  History of A. Fib Rate controlled. Coumadin per pharmacy.  Therapeutic INR.  Per cardiology does not need to be bridged with heparin.  CKD stage III Creatinine at baseline.  Stable.  Anemia and Thrombocytopenia (Pancytopenia) due to Myelodysplastic syndrome Had bone marrow biopsy in March of 2013, myelodysplastic syndrome, Dr. Shirline Frees. Patient chose conservative approach. Hemoglobin and platelet count stable.  Shortness of breath Worse today on walking. He has diastolic CHF and has been getting lasix. Will obtain bnp.and also CXR.  Coronary artery disease Stable.  Cachexia Encourage supplemental nutrition.  Diarrhea Cdiff PCR negative.  Start the patient on Imodium as needed for diarrhea.  Maybe due to antibiotics.  Continue to monitor.  Hypokalemia Potassium replaced  Prophylaxis SCDs.  Disposition Possible home in am   LOS: 6 days  Jearld Hemp S, MD 12/09/2011, 2:04 PM

## 2011-12-09 NOTE — Progress Notes (Signed)
ANTICOAGULATION CONSULT NOTE - Follow Up Consult  Pharmacy Consult for coumadin Indication: atrial fibrillation  Allergies  Allergen Reactions  . Oxycodone Hcl Other (See Comments)    Wanted to climb the wall  . Prednisone Other (See Comments)    Gain weight     Patient Measurements: Height: 6\' 1"  (185.4 cm) Weight: 155 lb 13.8 oz (70.7 kg) IBW/kg (Calculated) : 79.9    Vital Signs: Temp: 97.6 F (36.4 C) (09/12 1048) Temp src: Oral (09/12 0844) BP: 150/90 mmHg (09/12 1048) Pulse Rate: 86  (09/12 1048)  Labs:  Basename 12/09/11 1104 12/09/11 0650 12/08/11 0530 12/07/11 0617 12/06/11 1847 12/06/11 1321  HGB -- 9.2* 9.0* -- -- --  HCT -- 30.4* 29.7* 29.1* -- --  PLT -- 91* 87* 75* -- --  APTT -- -- -- -- -- --  LABPROT 17.3* -- 23.4* 29.5* -- --  INR 1.39 -- 2.04* 2.75* -- --  HEPARINUNFRC -- -- -- -- -- --  CREATININE -- 3.05* 2.98* 2.91* -- --  CKTOTAL -- -- -- -- -- --  CKMB -- -- -- -- -- --  TROPONINI -- -- -- -- 0.53* 0.49*    Estimated Creatinine Clearance: 16.1 ml/min (by C-G formula based on Cr of 3.05).   Medications:  Scheduled:    . atorvastatin  20 mg Oral q1800  . azithromycin  250 mg Oral Daily  . cefUROXime  500 mg Oral BID WC  . cloNIDine  0.1 mg Oral Daily  . furosemide  40 mg Oral Daily  . isosorbide mononitrate  30 mg Oral QPM  . metoprolol  50 mg Oral QHS  . pantoprazole  40 mg Oral BID AC  . potassium chloride  10 mEq Intravenous Q1 Hr x 3  . Tamsulosin HCl  0.8 mg Oral QHS  . Warfarin - Pharmacist Dosing Inpatient   Does not apply q1800    Assessment: 78 yr old male on coumadin for afib.  Coumadin held for EGD, last dose given on 9/8.   Vitamin K po given on 9/10.  Per GI, ok to resume anticoagulation.  No bleeding reported. INR=1.39, below goal.  Patients home dose of coumadin is 5mg  daily.   Goal of Therapy:  INR 2-3 Monitor platelets by anticoagulation protocol: Yes   Plan:  Coumadin 7.5mg  po x 1 dose tonight. Daily  PT/INR.   Wendie Simmer, PharmD, BCPS Clinical Pharmacist  Pager: [phone]

## 2011-12-09 NOTE — Progress Notes (Signed)
Physical Therapy Treatment Patient Details Name: Tracy [name] MRN: [id number] DOB: [birth year] Today's Date: 12/09/2011 Time: 4098-1191 PT Time Calculation (min): 16 min  PT Assessment / Plan / Recommendation Comments on Treatment Session  Sats dropped to 89% on room air today with walking.  See vitals tab for further findings.  Family present and quite pleased at pt. improvement.    Follow Up Recommendations  Home health PT    Barriers to Discharge        Equipment Recommendations  Rolling walker with 5" wheels    Recommendations for Other Services    Frequency Min 3X/week   Plan Discharge plan remains appropriate    Precautions / Restrictions Precautions Precautions: Fall Restrictions Weight Bearing Restrictions: No   Pertinent Vitals/Pain O2 sats on room air with hallway ambulation down to 89%    Mobility  Bed Mobility Bed Mobility: Supine to Sit Supine to Sit: 6: Modified independent (Device/Increase time) Details for Bed Mobility Assistance: safety cueing Transfers Transfers: Sit to Stand;Stand to Sit Sit to Stand: 5: Supervision;From bed;With upper extremity assist Stand to Sit: 5: Supervision;To bed;With upper extremity assist Details for Transfer Assistance: cues for safety and steadiness Ambulation/Gait Ambulation/Gait Assistance: 5: Supervision;4: Min assist Ambulation Distance (Feet): 300 Feet Assistive device: None Ambulation/Gait Assistance Details: One initial loss of balance when first up, then at supervision level throughout walk, including turns Gait Pattern: Step-through pattern    Exercises     PT Diagnosis:    PT Problem List:   PT Treatment Interventions:     PT Goals Acute Rehab PT Goals PT Goal: Supine/Side to Sit - Progress: Progressing toward goal PT Goal: Sit to Stand - Progress: Progressing toward goal PT Goal: Stand to Sit - Progress: Progressing toward goal PT Goal: Ambulate - Progress: Progressing toward goal  Visit Information  Last PT Received On: 12/09/11 Assistance Needed: +1    Subjective Data  Subjective: feeling better   Cognition  Overall Cognitive Status: Impaired Area of Impairment: Attention Orientation Level: Appears intact for tasks assessed Behavior During Session: [facility] for tasks performed    Balance     End of Session PT - End of Session Equipment Utilized During Treatment: Gait belt Activity Tolerance: Patient tolerated treatment well Patient left: in bed;with call bell/phone within reach;with family/visitor present;Other (comment) (PA in room) Nurse Communication: Mobility status   GP     Ferman Hamming 12/09/2011, 3:32 PM Weldon Picking PT Acute Rehab Services [phone] [place] [phone]

## 2011-12-09 NOTE — H&P (View-Only) (Signed)
Brief History: 78-year-old male he was admitted on 12/04/2011 after being called by his nephrologist, Dr. Fox, with abnormal elevated calcium levels on the labs.  Patient has a history of atrial fibrillation on chronic anticoagulation.  Since being admitted hypercalcemia corrected.  Patient was also found to have community acquired pneumonia currently being treated on antibiotics.  On 12/06/2011 patient had hematemesis, workup pending at this time.    Consultants: Dr. Coladonato,  kidney. Dr. Outlaw, GI Dr. Nishan, Cardiology  Subjective: .Patient became short of breath on walking and oxygen saturation dropped to 85%  Objective: Vital signs in last 24 hours: Filed Vitals:   12/07/11 1726 12/07/11 2122 12/08/11 0559 12/08/11 1341  BP: 136/81 173/96 131/69 111/62  Pulse: 84 94 86 81  Temp: 98.3 F (36.8 C) 97.9 F (36.6 C) 98.3 F (36.8 C)   TempSrc: Oral Oral Oral   Resp:  18 18 18  Height:      Weight:  72 kg (158 lb 11.7 oz)    SpO2: 96% 93% 90% 95%   Weight change: 0.6 kg (1 lb 5.2 oz)  Intake/Output Summary (Last 24 hours) at 12/08/11 1535 Last data filed at 12/08/11 1300  Gross per 24 hour  Intake    360 ml  Output   2552 ml  Net  -2192 ml    Physical Exam: General: Awake, Oriented, No acute distress. HEENT: EOMI. Neck: Supple CV: S1 and S2 Lungs: Clear to ascultation bilaterally Abdomen: Soft, Nontender, Nondistended, +bowel sounds. Ext: Good pulses. Trace edema.  Lab Results: Basic Metabolic Panel:  Lab 12/08/11 0530 12/07/11 0617 12/06/11 0919 12/05/11 0515 12/04/11 0655 12/03/11 2353 12/03/11 1430  NA 138 138 136 135 136 -- --  K 3.1* 3.2* 3.6 4.2 4.0 -- --  CL 106 108 102 102 101 -- --  CO2 20 21 20 21 23 -- --  GLUCOSE 89 88 99 112* 97 -- --  BUN 35* 35* 38* 45* 48* -- --  CREATININE 2.98* 2.91* 2.89* 3.12* 3.31* -- --  CALCIUM 8.9 8.9 10.0 11.8* 14.3* -- --  MG -- -- -- -- -- -- --  PHOS -- -- -- -- -- 6.7* 6.5*   Liver Function  Tests:  Lab 12/05/11 0515 12/03/11 2353 12/03/11 1430  AST 25 20 --  ALT 17 17 --  ALKPHOS 60 72 --  BILITOT 0.8 1.0 --  PROT 7.0 7.4 --  ALBUMIN 3.6 3.8 3.9   No results found for this basename: LIPASE:5,AMYLASE:5 in the last 168 hours No results found for this basename: AMMONIA:5 in the last 168 hours CBC:  Lab 12/08/11 0530 12/07/11 0617 12/06/11 1630 12/05/11 0515 12/04/11 0655  WBC 4.6 4.2 4.7 4.9 5.9  NEUTROABS -- -- -- -- --  HGB 9.0* 8.9* 9.7* 10.6* 11.8*  HCT 29.7* 29.1* 32.9* 34.2* 38.2*  MCV 95.2 96.7 98.8 95.3 95.7  PLT 87* 75* 97* 122* 130*   Cardiac Enzymes:  Lab 12/06/11 1847 12/06/11 1321  CKTOTAL -- --  CKMB -- --  CKMBINDEX -- --  TROPONINI 0.53* 0.49*   BNP (last 3 results)  Basename 08/04/11 1241 02/16/11 1044  PROBNP 601.0* 3945.0*   CBG: No results found for this basename: GLUCAP:5 in the last 168 hours No results found for this basename: HGBA1C:5 in the last 72 hours Other Labs: No components found with this basename: POCBNP:3 No results found for this basename: DDIMER:2 in the last 168 hours No results found for this basename: CHOL:2,HDL:2,LDLCALC:2,TRIG:2,CHOLHDL:2,LDLDIRECT:2 in the last 168   hours  Lab 12/07/11 0617  TSH 1.262  T4TOTAL --  T3FREE --  FREET4 --  THYROIDAB --    Lab 12/03/11 1430  VITAMINB12 --  FOLATE --  FERRITIN 87  TIBC 274  IRON 165*  RETICCTPCT --    Micro Results: Recent Results (from the past 240 hour(s))  MRSA PCR SCREENING     Status: Normal   Collection Time   12/04/11  3:00 AM      Component Value Range Status Comment   MRSA by PCR NEGATIVE  NEGATIVE Final   CULTURE, BLOOD (ROUTINE X 2)     Status: Normal (Preliminary result)   Collection Time   12/04/11 11:10 AM      Component Value Range Status Comment   Specimen Description BLOOD RIGHT HAND   Final    Special Requests BOTTLES DRAWN AEROBIC ONLY 5CC   Final    Culture  Setup Time 12/04/2011 19:27   Final    Culture     Final    Value:         BLOOD CULTURE RECEIVED NO GROWTH TO DATE CULTURE WILL BE HELD FOR 5 DAYS BEFORE ISSUING A FINAL NEGATIVE REPORT   Report Status PENDING   Incomplete   CULTURE, BLOOD (ROUTINE X 2)     Status: Normal (Preliminary result)   Collection Time   12/04/11 11:15 AM      Component Value Range Status Comment   Specimen Description BLOOD LEFT HAND   Final    Special Requests BOTTLES DRAWN AEROBIC ONLY 5CC   Final    Culture  Setup Time 12/04/2011 19:27   Final    Culture     Final    Value:        BLOOD CULTURE RECEIVED NO GROWTH TO DATE CULTURE WILL BE HELD FOR 5 DAYS BEFORE ISSUING A FINAL NEGATIVE REPORT   Report Status PENDING   Incomplete   CLOSTRIDIUM DIFFICILE BY PCR     Status: Normal   Collection Time   12/06/11 12:37 AM      Component Value Range Status Comment   C difficile by pcr NEGATIVE  NEGATIVE Final     Studies/Results: No results found.  Medications: I have reviewed the patient's current medications. Scheduled Meds:    . atorvastatin  20 mg Oral q1800  . azithromycin  250 mg Oral Daily  . cefUROXime  500 mg Oral BID WC  . cloNIDine  0.1 mg Oral Daily  . furosemide  40 mg Oral Daily  . isosorbide mononitrate  30 mg Oral QPM  . metoprolol  50 mg Oral QHS  . pantoprazole  40 mg Oral BID AC  . phytonadione  5 mg Oral Once  . potassium chloride SA  40 mEq Oral Once  . Tamsulosin HCl  0.8 mg Oral QHS  . Warfarin - Pharmacist Dosing Inpatient   Does not apply q1800  . DISCONTD: furosemide  40 mg Oral BID   Continuous Infusions:    . sodium chloride Stopped (12/07/11 1004)   PRN Meds:.acetaminophen, acetaminophen, hydrALAZINE, HYDROmorphone (DILAUDID) injection, loperamide, nitroGLYCERIN, ondansetron (ZOFRAN) IV, ondansetron, promethazine, promethazine, zolpidem  Assessment/Plan: Hypercalcemia Etiology unclear, resolved. Patient reports that he drinks 3 glasses of milk daily, (breakfast, afternoon, and at bedtime), continue to hold milk. Received Pamidronate once on  12/04/2011. IV fluids and on continue oral lasix. Does not report taking Vit D at home. Vit D 25 low at 25 (30-89) and Vit D 1-25 pending.  PTH low   at 12.5, PTH-related peptide pending. SPEP showed possibility of faint restricted bands cannot be completely excluded in the gamma region.  UPEP showed no monoclonal free light chains (Bence-Jones protein) are detected, urine IFE shows polyclonal increase in free The hand or free lambda light chains.  Discussed with renal (who discussed with on call oncology), has had CT of abd and pelvis in July and CT chest in May 2013, where no bony mets noted. Has had bone marrow biopsy in March of 2013, no signs of myeloma was commented.   Hematemesis Appreciate Dr. Outlaw's input.  Hemoglobin stable.  Patient on anticoagulation for A. Fib, will give the patient vitamin K 5 mg once to reverse anticoagulation to allow for INR less than 2.0 for EGD.  Elevated troponin Appreciate cardiology evaluation.  No further workup as per cardiology.  Community acquired pneumonia Initially on Ceftriaxone transitioned to cefuroxime and azithromycin. Antibiotics since 12/04/2011. Stable.  Defined 7 to ten-day course of antibiotics.  Several bilateral subpleural nodules in May of 2013 Will need outpatient followup for CT in 6 months. Dr. Alva following.  Hyperlipidemia Resume statin (patient on rosuvastatin not on medication list).  Hypertension Stable.  History of A. Fib Rate controlled. Coumadin per pharmacy.  Therapeutic INR.  Per cardiology does not need to be bridged with heparin.  CKD stage III Creatinine at baseline.  Stable.  Anemia and Thrombocytopenia (Pancytopenia) due to Myelodysplastic syndrome Had bone marrow biopsy in March of 2013, myelodysplastic syndrome, Dr. Mohammed. Patient chose conservative approach. Hemoglobin and platelet count stable.  Shortness of breath Worse today on walking. He has diastolic CHF and has been getting lasix. Will obtain bnp.and  also CXR.  Coronary artery disease Stable.  Cachexia Encourage supplemental nutrition.  Diarrhea Cdiff PCR negative.  Start the patient on Imodium as needed for diarrhea.  Maybe due to antibiotics.  Continue to monitor.  Hypokalemia Will replace the potassium. Also check magnesium level  Prophylaxis SCDs.  Disposition Pending GI evaluation.   LOS: 5 days  LAMA,GAGAN S, MD 12/08/2011, 3:35 PM 

## 2011-12-10 ENCOUNTER — Telehealth: Payer: Self-pay | Admitting: Internal Medicine

## 2011-12-10 ENCOUNTER — Encounter (HOSPITAL_COMMUNITY): Payer: Self-pay | Admitting: Gastroenterology

## 2011-12-10 ENCOUNTER — Other Ambulatory Visit: Payer: Self-pay | Admitting: Cardiology

## 2011-12-10 ENCOUNTER — Encounter (HOSPITAL_COMMUNITY): Payer: Self-pay

## 2011-12-10 DIAGNOSIS — I2510 Atherosclerotic heart disease of native coronary artery without angina pectoris: Secondary | ICD-10-CM

## 2011-12-10 DIAGNOSIS — I498 Other specified cardiac arrhythmias: Secondary | ICD-10-CM

## 2011-12-10 LAB — CBC
HCT: 29.8 % — ABNORMAL LOW (ref 39.0–52.0)
MCH: 28.9 pg (ref 26.0–34.0)
MCHC: 30.5 g/dL (ref 30.0–36.0)
MCV: 94.6 fL (ref 78.0–100.0)
RDW: 15.3 % (ref 11.5–15.5)

## 2011-12-10 LAB — CULTURE, BLOOD (ROUTINE X 2): Culture: NO GROWTH

## 2011-12-10 LAB — RENAL FUNCTION PANEL
Albumin: 3.1 g/dL — ABNORMAL LOW (ref 3.5–5.2)
BUN: 37 mg/dL — ABNORMAL HIGH (ref 6–23)
Calcium: 8.5 mg/dL (ref 8.4–10.5)
Creatinine, Ser: 3.06 mg/dL — ABNORMAL HIGH (ref 0.50–1.35)
Phosphorus: 2.4 mg/dL (ref 2.3–4.6)

## 2011-12-10 MED ORDER — DARBEPOETIN ALFA-POLYSORBATE 60 MCG/0.3ML IJ SOLN
60.0000 ug | INTRAMUSCULAR | Status: DC
  Filled 2011-12-10: qty 0.3

## 2011-12-10 MED ORDER — DARBEPOETIN ALFA-POLYSORBATE 150 MCG/0.3ML IJ SOLN
150.0000 ug | INTRAMUSCULAR | Status: DC
  Filled 2011-12-10: qty 0.3

## 2011-12-10 MED ORDER — PANTOPRAZOLE SODIUM 40 MG PO TBEC: 40.0000 mg | DELAYED_RELEASE_TABLET | Freq: Every day | ORAL | Status: DC

## 2011-12-10 MED ORDER — MOXIFLOXACIN HCL 400 MG PO TABS: 400.0000 mg | ORAL_TABLET | Freq: Every day | ORAL | Status: AC

## 2011-12-10 MED ORDER — FUROSEMIDE 40 MG PO TABS: 40.0000 mg | ORAL_TABLET | Freq: Every day | ORAL | Status: DC

## 2011-12-10 NOTE — Progress Notes (Signed)
Subjective: Feeling okay this morning, slept very well. Was sleeping when I entered. Mental status in tact. No pain today. Had EGD yesterday and no overt signs of bleeding or suspicious areas in stomach or duodenum. Would like to review all of his medicines and doses before leaving to confirm any changes. Having some diarrhea after the procedure.   Objective Vital signs in last 24 hours: Filed Vitals:   12/09/11 1420 12/09/11 1657 12/09/11 2141 12/10/11 0554  BP:  148/96 152/91 101/58  Pulse:  78 91 82  Temp:  97.8 F (36.6 C) 99 F (37.2 C) 98.4 F (36.9 C)  TempSrc:   Oral Oral  Resp:  18 18 17   Height:      Weight:   157 lb (71.215 kg)   SpO2: 89% 94% 92% 94%   Weight change: 1 lb 2.2 oz (0.515 kg)  Intake/Output Summary (Last 24 hours) at 12/10/11 0801 Last data filed at 12/09/11 2100  Gross per 24 hour  Intake    600 ml  Output    875 ml  Net   -275 ml   Labs: Basic Metabolic Panel:  Lab 12/10/11 4098 12/09/11 0650 12/08/11 0530 12/07/11 0617 12/06/11 0919 12/05/11 0515 12/04/11 0655 12/03/11 2353 12/03/11 1430  NA 137 139 138 138 136 135 136 -- --  K 3.9 3.6 3.1* 3.2* 3.6 4.2 4.0 -- --  CL 106 107 106 108 102 102 101 -- --  CO2 21 21 20 21 20 21 23  -- --  GLUCOSE 91 97 89 88 99 112* 97 -- --  BUN 37* 35* 35* 35* 38* 45* 48* -- --  CREATININE 3.06* 3.05* 2.98* 2.91* 2.89* 3.12* 3.31* -- --  ALB -- -- -- -- -- -- -- -- --  CALCIUM 8.5 8.8 8.9 8.9 10.0 11.8* 14.3* -- --  PHOS 2.4 2.4 -- -- -- -- -- 6.7* 6.5*   Liver Function Tests:  Lab 12/10/11 0540 12/09/11 0650 12/05/11 0515 12/03/11 2353  AST -- -- 25 20  ALT -- -- 17 17  ALKPHOS -- -- 60 72  BILITOT -- -- 0.8 1.0  PROT -- -- 7.0 7.4  ALBUMIN 3.1* 3.1* 3.6 --   CBC:  Lab 12/10/11 0540 12/09/11 0650 12/08/11 0530 12/07/11 0617  WBC 4.5 4.5 4.6 4.2  NEUTROABS -- -- -- --  HGB 9.1* 9.2* 9.0* 8.9*  HCT 29.8* 30.4* 29.7* 29.1*  MCV 94.6 95.0 95.2 96.7  PLT 95* 91* 87* 75*   Iron Studies:   Lab  12/03/11 1430  IRON 165*  TIBC 274  TRANSFERRIN --  FERRITIN 87    Physical Exam:  Blood pressure 101/58, pulse 82, temperature 98.4 F (36.9 C), temperature source Oral, resp. rate 17, height 6\' 1"  (1.854 m), weight 157 lb (71.215 kg), SpO2 94.00%.  General: resting in bed, sleeping when I enter HEENT: PERRL, EOMI, no scleral icterus Cardiac: RRR, no rubs, murmurs or gallops Pulm: clear to auscultation bilaterally, moving normal volumes of air Abd: soft, nontender, nondistended, BS present Ext: warm and well perfused, no pedal edema Neuro: alert and oriented X3, cranial nerves II-XII grossly intact  Assessment/Plan 1 Hypercalcemia- could have malignancy. PTH from Aug 9 is suppressed. SPEP showed possibility of faint restricted bands cannot be completely excluded in the gamma region. UPEP showed no monoclonal free light chains (Bence-Jones protein) are detected. MRCP unremarkable. Ca today 8.5.  -Endoscopy normal -TSH normal -Vit D 25 level low, Vit D 1,25 level mildly elevated (normal upper range  72, his 80) -PTHrP still pending -Vein mapping done and needs vascular follow up out-patient  2 Pancytopenia, s/p BM bx   3 BPH, due for TURP in Oct  4 Afib - Per primary team.  5 HTN - Per primary team. BP 120s/60s.  6 CAD - Per primary team.  Genella Mech MD 12/10/2011, 8:01 AM PGY-2

## 2011-12-10 NOTE — Discharge Summary (Signed)
[facility]                                                                                   Tracy [name], is a 78 y.o. male  DOB [birth year]  MRN [id number].  Admission date:  12/03/2011  Discharge Date:  12/10/2011  Primary MD  [name], MD  Admitting Physician  Ron Parker, MD  Admission Diagnosis  Hypercalcemia [275.42] Elevated labs hematemesis  Discharge Diagnosis     Principal Problem:  *Hypercalcemia Active Problems:  HYPERLIPIDEMIA  Essential hypertension, benign  CAD  Atrial fibrillation  Chronic diastolic heart failure  Pancytopenia  CKD (chronic kidney disease), stage III  Cachexia      Past Medical History  Diagnosis Date  . CAD (coronary artery disease)     status post prior PCI to the obtuse marginal in 1997 and stenting to the mid to distal RCA in 2001; LHC 9/06:  LM ok, pLAD 50%, mLAD 60% then 60-70%, mRI 50%, mOM1 80-90% (small), pRCA and dRCA stents ok, PDA 50%, EF 65%;  Myoview 5/12: No ischemia, EF 64%.;  NSTEMI 11/12 tx medically   . Chronic kidney disease, stage III (moderate)   . TIA (transient ischemic attack)   . HTN (hypertension)   . Hyperlipidemia   . AF (atrial fibrillation) 02/16/11    Coumadin  . Arthritis     "in my left ankle"  . Chronic diastolic heart failure   . Anemia of chronic disease   . Carotid stenosis     dopplers 03/2011: 0-39% bilateral    Past Surgical History  Procedure Date  . Nose surgery 1985    deviated septum  . Total knee arthroplasty 2009    left  . Joint replacement 2009    left knee  . Appendectomy 1930's  . Tonsillectomy and adenoidectomy 1930's  . Coronary angioplasty with stent placement     3 procedures; 3 stents total  . Cataract extraction w/ intraocular lens  implant, bilateral   . Surgery scrotal / testicular 1971    "testicle crushed; removed"  . Esophagogastroduodenoscopy 12/09/2011    Procedure: ESOPHAGOGASTRODUODENOSCOPY (EGD);  Surgeon: Willis Modena,  MD;  Location: [facility] ENDOSCOPY;  Service: Endoscopy;  Laterality: N/A;  outlaw/ebp     Recommendations for primary care physician for things to follow:        Discharge Condition: Stable   Diet recommendation: heart healthy, low salt diet   Consults  Nephrology Cardiology   History of present illness and  Hospital Course:   Tracy [name] is a 78 y.o. male who was called by his nephrologist, Dr. Caryn Section, after his lab results returned, and he was told to go immediately to the ED because of his calcium level of 15. Patient reports going to his nephrologist to have bloodwork done. He had been receiving Aranesp injections for his anemia, and and the last 2 times his hemoglobin had been 11 so the injections were discontinued for now. He reports undergoing a workup for his prostate and is to have a procedure done. He has had increased confusion and weakness over the past week, and he reports losing his appetite over the  past 2 months and losing about 20 pounds. He denies having any fevers or chills.  Hypercalcemia  Etiology unclear, resolved. Patient reports that he drinks 3 glasses of milk daily, (breakfast, afternoon, and at bedtime), continue to hold milk. Received Pamidronate once on 12/04/2011. IV fluids and on continue oral lasix. Does not report taking Vit D at home. Vit D 25 low at 25 (30-89) and Vit D 1-25 pending. PTH low at 12.5, PTH-related peptide pending. SPEP showed possibility of faint restricted bands cannot be completely excluded in the gamma region. UPEP showed no monoclonal free light chains (Bence-Jones protein) are detected, urine IFE shows polyclonal increase in free The hand or free lambda light chains. Discussed with renal (who discussed with on call oncology), has had CT of abd and pelvis in July and CT chest in May 2013, where no bony mets noted. Has had bone marrow biopsy in March of 2013, no signs of myeloma was commented. At this time serum calcium is 8.5. And patient is  advised to cut down on diaetery intake of calcium.  Hematemesis On 12/05/11 patient had episodes of nausea, vomiting with blood clots, patient had GI consult and underwent EGD, which was essentially normal.Patient has been prescribed protonix indefinitely  CAD Patient had mild elevation of troponin, cardiology was consulted. No further recommendation per cardiology.  Community acquired pneumonia Patient was Initially on Ceftriaxone transitioned to cefuroxime and azithromycin. Antibiotics since 12/04/2011. Patient will be discharged on po avelox for five days as CXR done on 9/11 again showed pneumonia.  Diastolic dysfunction Patient was seen by cardiology and recommend to continue with lasix. Patient's breathing has improved with diuresis.  Several bilateral subpleural nodules in May of 2013  Will need outpatient followup for CT in 6 months. Dr. Vassie Loll following.  Hyperlipidemia  Resume statin (patient on rosuvastatin not on medication list).   Hypertension  Stable.   History of A. Fib  Rate controlled. Coumadin per pharmacy. Therapeuticsubtherapeutic INR. Will need to follow [facility] cardiology to check PT/INR.  CKD stage III  Creatinine at baseline. Stable.   Anemia and Thrombocytopenia (Pancytopenia) due to Myelodysplastic syndrome  Had bone marrow biopsy in March of 2013, myelodysplastic syndrome, Dr. Shirline Frees. Patient chose conservative approach. Hemoglobin and platelet count stable.  Today   Subjective:   Tracy [name] today has no headache,no chest abdominal pain  Objective:   Blood pressure 128/70, pulse 77, temperature 98.3 F (36.8 C), temperature source Oral, resp. rate 18, height 6\' 1"  (1.854 m), weight 71.215 kg (157 lb), SpO2 92.00%.   Intake/Output Summary (Last 24 hours) at 12/10/11 1338 Last data filed at 12/10/11 0830  Gross per 24 hour  Intake    960 ml  Output    875 ml  Net     85 ml    Exam Awake Alert, Oriented *3, No new F.N deficits, Normal  affect [place].AT,PERRAL Supple Neck,No JVD, No cervical lymphadenopathy appriciated.  Symmetrical Chest wall movement, Good air movement bilaterally, CTAB RRR,No Gallops,Rubs or new Murmurs, No Parasternal Heave +ve B.Sounds, Abd Soft, Non tender, No organomegaly appriciated, No rebound -guarding or rigidity. No Cyanosis, Clubbing or edema, No new Rash or bruise  Data Review   Major procedures and Radiology Reports - PLEASE review detailed and final reports for all details in brief -      Dg Chest 2 View  12/08/2011  *RADIOLOGY REPORT*  Clinical Data: Pneumonia.  Short of breath.  Previous myocardial infarct.  Atrial fibrillation.  CHEST - 2 VIEW  Comparison: 12/04/2011  Findings: Changes of COPD are again seen.  Mild cardiomegaly stable.  Left lower lobe airspace disease shows no significant change, consistent with pneumonia.  Right lung is clear.  No evidence of pleural effusion.  IMPRESSION:  1. No significant change in left lower lobe airspace disease, consistent with pneumonia. 2.  Stable mild cardiomegaly and COPD.   Original Report Authenticated By: Danae Orleans, M.D.    Dg Chest 2 View  12/04/2011  *RADIOLOGY REPORT*  Clinical Data: COPD.  CHEST - 2 VIEW  Comparison: 08/04/2011  Findings: New airspace disease is seen in the left lower lobe, consistent with pneumonia.  Changes of COPD again demonstrated. Mild cardiomegaly is stable.  No evidence of congestive heart failure or pleural effusion.  IMPRESSION:  1.  New left lower lobe airspace disease, consistent with pneumonia. 2.  COPD. 3.  Stable mild cardiomegaly.   Original Report Authenticated By: Danae Orleans, M.D.    Mr Mrcp  12/06/2011  *RADIOLOGY REPORT*  Clinical Data:  Weight loss, dilated common duct and pancreatic duct on prior exam and possible common duct stone.  Anorexia.  MRI ABDOMEN WITHOUT CONTRAST (MRCP)  Technique: Multiplanar multisequence MR imaging of the abdomen was performed, including heavily T2-weighted images of the  biliary and pancreatic ducts.  Three-dimensional MR images were rendered by post processing of the original MR data.  Comparison:  CT abdomen/pelvis 10/13/2011  Findings:  Trace perinephric stranding and bilateral T2 hyperintense renal cortical cysts are again noted, largest left lower renal pole 3.5 cm.  These are stable allowing for differences in technique.  No hydronephrosis.  Common duct is mildly dilated measuring 1.1 cm at its midportion.  No pancreatic ductal dilatation.  T2 hyperintense too small to characterize 4 mm hyperintense focus left hepatic lobe lateral segment image 24 series 3 is most likely a cyst or possibly biliary hamartoma.  This may be too small to be previously visualized.  Spleen, adrenal glands, pancreas, and gallbladder are unremarkable.  No lymphadenopathy or ascites.  Disc degenerative changes are noted in the spine.  Some series are degraded by patient respiratory motion. Allowing for this, on heavily T2-weighted images, there is no focal filling defect within the common duct.  There is gradual tapering to the level of the ampulla.  The liver is markedly hypointense on T1 and T2-weighted imaging which may indicate iron deposition.  No lymphadenopathy.   IMPRESSION: Mild common duct dilatation to 1 cm without filling defect allowing for mild motion artifact.  Gradual tapering to the ampulla is noted and there is no intrahepatic ductal dilatation.  Hepatic hypointensity which may indicate iron deposition.   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  No secondary imaging findings to suggest cirrhosis.   Original Report Authenticated By: Harrel Lemon, M.D.     Micro Results     Recent Results (from the past 240 hour(s))  MRSA PCR SCREENING     Status: Normal   Collection Time   12/04/11  3:00 AM      Component Value Range Status Comment   MRSA by PCR NEGATIVE  NEGATIVE Final   CULTURE, BLOOD (ROUTINE X 2)     Status: Normal    Collection Time   12/04/11 11:10 AM      Component Value Range Status Comment   Specimen Description BLOOD RIGHT HAND   Final    Special Requests BOTTLES DRAWN AEROBIC ONLY 5CC   Final    Culture  Setup Time 12/04/2011 19:27   Final    Culture NO GROWTH 5 DAYS   Final    Report Status 12/10/2011 FINAL   Final   CULTURE, BLOOD (ROUTINE X 2)     Status: Normal   Collection Time   12/04/11 11:15 AM      Component Value Range Status Comment   Specimen Description BLOOD LEFT HAND   Final    Special Requests BOTTLES DRAWN AEROBIC ONLY 5CC   Final    Culture  Setup Time 12/04/2011 19:27   Final    Culture NO GROWTH 5 DAYS   Final    Report Status 12/10/2011 FINAL   Final   CLOSTRIDIUM DIFFICILE BY PCR     Status: Normal   Collection Time   12/06/11 12:37 AM      Component Value Range Status Comment   C difficile by pcr NEGATIVE  NEGATIVE Final      CBC w Diff: Lab Results  Component Value Date   WBC 4.5 12/10/2011   WBC 3.6* 09/28/2011   HGB 9.1* 12/10/2011   HGB 8.7* 09/28/2011   HCT 29.8* 12/10/2011   HCT 27.6* 09/28/2011   PLT 95* 12/10/2011   PLT 107* 09/28/2011   LYMPHOPCT 22.7 09/28/2011   LYMPHOPCT 19.7 08/04/2011   MONOPCT 17.1* 09/28/2011   MONOPCT 11.6 08/04/2011   EOSPCT 2.3 09/28/2011   EOSPCT 2.7 08/04/2011   BASOPCT 0.3 09/28/2011   BASOPCT 0.1 08/04/2011    CMP: Lab Results  Component Value Date   NA 137 12/10/2011   K 3.9 12/10/2011   CL 106 12/10/2011   CO2 21 12/10/2011   BUN 37* 12/10/2011   CREATININE 3.06* 12/10/2011   PROT 7.0 12/05/2011   ALBUMIN 3.1* 12/10/2011   BILITOT 0.8 12/05/2011   ALKPHOS 60 12/05/2011   AST 25 12/05/2011   ALT 17 12/05/2011  .      Discharge Medications     Medication List     As of 12/10/2011  1:38 PM    START taking these medications         furosemide 40 MG tablet   Commonly known as: LASIX   Take 1 tablet (40 mg total) by mouth daily.      moxifloxacin 400 MG tablet   Commonly known as: AVELOX   Take 1 tablet (400 mg total) by mouth daily.        CONTINUE taking these medications         acetaminophen 500 MG tablet   Commonly known as: TYLENOL      amLODipine 10 MG tablet   Commonly known as: NORVASC      cloNIDine 0.1 MG tablet   Commonly known as: CATAPRES      FLOMAX 0.4 MG Caps   Generic drug: Tamsulosin HCl      isosorbide mononitrate 30 MG 24 hr tablet   Commonly known as: IMDUR      metoprolol 50 MG tablet   Commonly known as: LOPRESSOR      nitroGLYCERIN 0.4 MG SL tablet   Commonly known as: NITROSTAT      warfarin 5 MG tablet   Commonly known as: COUMADIN          Where to get your medications    These are the prescriptions that you need to pick up.   You may get these medications from any pharmacy.         furosemide 40 MG tablet   moxifloxacin 400 MG  tablet               Total Time in preparing paper work, data evaluation and todays exam - 35 minutes  LAMA,GAGAN S M.D on 12/10/2011 at 1:38 PM  [facility] Office  [phone]

## 2011-12-10 NOTE — Progress Notes (Signed)
   CARE MANAGEMENT NOTE 12/10/2011  Patient:  Tracy [name], Tracy [name]   Account Number:  [id number]  Date Initiated:  12/06/2011  Documentation initiated by:  Lala Been  Subjective/Objective Assessment:   PT eval recommending HHPT, pt states that he will need rolling walker     Action/Plan:   Met with pt who selected [facility] for HHPT, [facility] notified.  12/06/2011 Pt declined walker when delivered to room.   Anticipated DC Date:  12/07/2011   Anticipated DC Plan:  HOME W HOME HEALTH SERVICES         [facility] Choice  HOME HEALTH   Choice offered to / List presented to:  C-1 Patient        HH arranged  HH-2 PT      [facility] agency  [facility]   Status of service:  In process, will continue to follow Medicare Important Message given?   (If response is "NO", the following Medicare IM given date fields will be blank) Date Medicare IM given:   Date Additional Medicare IM given:    Discharge Disposition:  HOME W HOME HEALTH SERVICES  Per UR Regulation:    If discussed at Long Length of Stay Meetings, dates discussed:    Comments:  12/10/2011 Met with pt again re [facility] needs and will continue to use [facility], however insist that his son will send a waker overnight from [place]. States that he will use his cane in the meantime.  CRoyal RN MPH  12/06/2011 Met with pt re recommendation for HHPT, pt selected [facility] and states that he will need a rolling walker. Tracy Shock RN MPH Case Manager [phone]

## 2011-12-10 NOTE — Telephone Encounter (Signed)
S/w the pt and he is aware of his appts on 12/16/2011@9 :45am

## 2011-12-10 NOTE — Progress Notes (Addendum)
I have seen and examined this patient and agree with plan as outlined by Dr. Dorise Hiss.  EGD negative.  Calcium normalized.  Would continue with lasix 40mg  daily and avoid vit D fortified milk/orange juice.  Will also need f/u with his PCP, primary nephrologist Dr. Caryn Section, and [facility] for Access placement.  Will also require outpt ESA and will give first dose today.  This will be arranged thru our off with Dr. Caryn Section.  Call with any questions or concerns.  Will sign off for now. Trystan Eads A,MD 12/10/2011 10:28 AM  Pt is due to see Dr. Caryn Section on 12/22/11 at 9:30am.  He is already set up for weekly Aranesp injections at shortstay at a dose of 

## 2011-12-10 NOTE — Progress Notes (Signed)
Physical Therapy Treatment/ Discharge Patient Details Name: Tracy [name] MRN: [id number] DOB: September 24, 1919 Today's Date: 12/10/2011 Time: 4098-1191 PT Time Calculation (min): 20 min  PT Assessment / Plan / Recommendation Comments on Treatment Session  Pt with sats maintained 90-94% on RA throughout mobility and stairs today. Pt states he is walking better and further now than baseline. Pt back to baseline with goals met and no further therapy needs at this time. Signing off. Pt aware and agreeable to no further needs.     Follow Up Recommendations  No PT follow up    Barriers to Discharge        Equipment Recommendations  None recommended by PT    Recommendations for Other Services    Frequency     Plan Discharge plan needs to be updated    Precautions / Restrictions Precautions Precautions: None Restrictions Weight Bearing Restrictions: No   Pertinent Vitals/Pain No pain sats 90-94% on RA    Mobility  Bed Mobility Bed Mobility: Supine to Sit Supine to Sit: 6: Modified independent (Device/Increase time);HOB flat Transfers Sit to Stand: 6: Modified independent (Device/Increase time);From toilet;From bed Stand to Sit: 6: Modified independent (Device/Increase time);To toilet;To chair/3-in-1 Ambulation/Gait Ambulation/Gait Assistance: 6: Modified independent (Device/Increase time) Ambulation Distance (Feet): 500 Feet Assistive device: None Ambulation/Gait Assistance Details: no LOB or gait deviations, pt with 2 standing rest to catch his breath Gait Pattern: Within Functional Limits Stairs: Yes Stairs Assistance: 6: Modified independent (Device/Increase time) Stair Management Technique: One rail Right Number of Stairs: 11     Exercises General Exercises - Lower Extremity Long Arc Quad: AROM;Both;20 reps;Seated Hip Flexion/Marching: AROM;Both;20 reps;Seated   PT Diagnosis:    PT Problem List:   PT Treatment Interventions:     PT Goals Acute Rehab PT Goals PT Goal:  Supine/Side to Sit - Progress: Met PT Goal: Sit to Stand - Progress: Met PT Goal: Stand to Sit - Progress: Met PT Goal: Ambulate - Progress: Met PT Goal: Up/Down Stairs - Progress: Met  Visit Information  Last PT Received On: 12/10/11 Assistance Needed: +1    Subjective Data  Subjective: I'm doing so much better   Cognition  Overall Cognitive Status: Appears within functional limits for tasks assessed/performed Arousal/Alertness: Awake/alert Orientation Level: Appears intact for tasks assessed Behavior During Session: [facility] for tasks performed    Balance     End of Session PT - End of Session Activity Tolerance: Patient tolerated treatment well Patient left: in chair;with call bell/phone within reach Nurse Communication: Mobility status   GP     [name] 12/10/2011, 12:11 PM Delaney Meigs, PT [phone]

## 2011-12-10 NOTE — Progress Notes (Signed)
I have seen and examined this patient and agree with plan as outlined by Dr. Dorise Hiss.  Awaiting EGD results. Kaidon Kinker A,MD 12/10/2011 8:51 AM

## 2011-12-15 ENCOUNTER — Encounter: Payer: Self-pay | Admitting: Internal Medicine

## 2011-12-16 ENCOUNTER — Ambulatory Visit: Payer: Medicare Other

## 2011-12-16 ENCOUNTER — Other Ambulatory Visit (HOSPITAL_COMMUNITY): Payer: Self-pay | Admitting: *Deleted

## 2011-12-16 ENCOUNTER — Ambulatory Visit (HOSPITAL_BASED_OUTPATIENT_CLINIC_OR_DEPARTMENT_OTHER): Payer: Medicare Other | Admitting: Physician Assistant

## 2011-12-16 ENCOUNTER — Ambulatory Visit (HOSPITAL_BASED_OUTPATIENT_CLINIC_OR_DEPARTMENT_OTHER): Payer: Medicare Other

## 2011-12-16 ENCOUNTER — Encounter: Payer: Self-pay | Admitting: Physician Assistant

## 2011-12-16 ENCOUNTER — Other Ambulatory Visit (HOSPITAL_BASED_OUTPATIENT_CLINIC_OR_DEPARTMENT_OTHER): Payer: Medicare Other | Admitting: Lab

## 2011-12-16 ENCOUNTER — Telehealth: Payer: Self-pay | Admitting: Internal Medicine

## 2011-12-16 VITALS — BP 104/61 | HR 73 | Temp 97.0°F | Resp 20 | Ht 73.0 in | Wt 180.4 lb

## 2011-12-16 DIAGNOSIS — D469 Myelodysplastic syndrome, unspecified: Secondary | ICD-10-CM

## 2011-12-16 DIAGNOSIS — D539 Nutritional anemia, unspecified: Secondary | ICD-10-CM

## 2011-12-16 HISTORY — DX: Myelodysplastic syndrome, unspecified: D46.9

## 2011-12-16 LAB — COMPREHENSIVE METABOLIC PANEL (CC13)
AST: 29 U/L (ref 5–34)
Alkaline Phosphatase: 64 U/L (ref 40–150)
BUN: 54 mg/dL — ABNORMAL HIGH (ref 7.0–26.0)
Calcium: 7.8 mg/dL — ABNORMAL LOW (ref 8.4–10.4)
Chloride: 115 mEq/L — ABNORMAL HIGH (ref 98–107)
Creatinine: 3.2 mg/dL (ref 0.7–1.3)

## 2011-12-16 LAB — CBC WITH DIFFERENTIAL/PLATELET
BASO%: 0 % (ref 0.0–2.0)
LYMPH%: 45.6 % (ref 14.0–49.0)
MCHC: 30.3 g/dL — ABNORMAL LOW (ref 32.0–36.0)
MCV: 94.3 fL (ref 79.3–98.0)
MONO%: 30.3 % — ABNORMAL HIGH (ref 0.0–14.0)
Platelets: 90 10*3/uL — ABNORMAL LOW (ref 140–400)
RBC: 2.83 10*6/uL — ABNORMAL LOW (ref 4.20–5.82)
nRBC: 0 % (ref 0–0)

## 2011-12-16 LAB — IRON AND TIBC
%SAT: 56 % — ABNORMAL HIGH (ref 20–55)
Iron: 127 ug/dL (ref 42–165)
UIBC: 100 ug/dL — ABNORMAL LOW (ref 125–400)

## 2011-12-16 LAB — VITAMIN B12: Vitamin B-12: 808 pg/mL (ref 211–911)

## 2011-12-16 MED ORDER — FILGRASTIM 480 MCG/0.8ML IJ SOLN
480.0000 ug | Freq: Once | INTRAMUSCULAR | Status: AC
  Administered 2011-12-16: 480 ug via SUBCUTANEOUS
  Filled 2011-12-16: qty 0.8

## 2011-12-16 NOTE — Patient Instructions (Addendum)
Keep your injection appointments for Neupogen for the next 2 days Follow up with Dr. [name] in one month

## 2011-12-16 NOTE — Telephone Encounter (Signed)
gv and printed appt for pt. °

## 2011-12-17 ENCOUNTER — Encounter (HOSPITAL_COMMUNITY)
Admission: RE | Admit: 2011-12-17 | Discharge: 2011-12-17 | Disposition: A | Discharge status: Home / Self Care | Payer: Medicare Other | Source: Ambulatory Visit | Attending: Nephrology | Admitting: Nephrology

## 2011-12-17 ENCOUNTER — Ambulatory Visit (HOSPITAL_BASED_OUTPATIENT_CLINIC_OR_DEPARTMENT_OTHER): Payer: Medicare Other

## 2011-12-17 VITALS — BP 115/67 | HR 94 | Temp 97.0°F

## 2011-12-17 DIAGNOSIS — D469 Myelodysplastic syndrome, unspecified: Secondary | ICD-10-CM

## 2011-12-17 LAB — POCT HEMOGLOBIN-HEMACUE: Hemoglobin: 8.3 g/dL — ABNORMAL LOW (ref 13.0–17.0)

## 2011-12-17 MED ORDER — FILGRASTIM 480 MCG/0.8ML IJ SOLN
480.0000 ug | Freq: Once | INTRAMUSCULAR | Status: AC
  Administered 2011-12-17: 480 ug via SUBCUTANEOUS
  Filled 2011-12-17: qty 0.8

## 2011-12-17 MED ORDER — DARBEPOETIN ALFA-POLYSORBATE 150 MCG/0.3ML IJ SOLN
150.0000 ug | INTRAMUSCULAR | Status: DC
  Administered 2011-12-17: 150 ug via SUBCUTANEOUS
  Filled 2011-12-17: qty 0.3

## 2011-12-18 ENCOUNTER — Ambulatory Visit (HOSPITAL_BASED_OUTPATIENT_CLINIC_OR_DEPARTMENT_OTHER): Payer: Medicare Other

## 2011-12-18 VITALS — BP 143/85 | HR 102 | Temp 97.9°F

## 2011-12-18 DIAGNOSIS — D469 Myelodysplastic syndrome, unspecified: Secondary | ICD-10-CM

## 2011-12-18 MED ORDER — FILGRASTIM 480 MCG/0.8ML IJ SOLN
480.0000 ug | Freq: Once | INTRAMUSCULAR | Status: AC
  Administered 2011-12-18: 480 ug via SUBCUTANEOUS

## 2011-12-20 NOTE — Progress Notes (Signed)
[facility] [phone]   [phone]  OFFICE PROGRESS NOTE  [name],Tracy A, MD [address] [place] [address]  DIAGNOSIS: Myelodysplastic syndrome with translocation (1;3).  PRIOR THERAPY: None  CURRENT THERAPY: Weekly Aranesp under the care of Dr. Caryn [name]  INTERVAL HISTORY: Tracy [name] 78 y.o. male returns to the clinic today for three-month followup visit accompanied by his wife. He was recently admitted for hypercalcemia at [facility]. He was given me a calcium during that admission. He presents today for followup. He complains of some intermittent nosebleeds. He states that his INR has been running between 2.7 3.2 lately. His Coumadin therapy is followed by his primary care physician. He receives weekly Aranesp under the care of Dr. Caryn [name] and is due for his next injection on 12/17/2011. He continues to have fatigue and weakness. He denied having any dizzy spells. He has no chest pain or shortness breath.   MEDICAL HISTORY: Past Medical History  Diagnosis Date  . CAD (coronary artery disease)     status post prior PCI to the obtuse marginal in 1997 and stenting to the mid to distal RCA in 2001; LHC 9/06:  LM ok, pLAD 50%, mLAD 60% then 60-70%, mRI 50%, mOM1 80-90% (small), pRCA and dRCA stents ok, PDA 50%, EF 65%;  Myoview 5/12: No ischemia, EF 64%.;  NSTEMI 11/12 tx medically   . Chronic kidney disease, stage III (moderate)   . TIA (transient ischemic attack)   . HTN (hypertension)   . Hyperlipidemia   . AF (atrial fibrillation) 02/16/11    Coumadin  . Arthritis     "in my left ankle"  . Chronic diastolic heart failure   . Anemia of chronic disease   . Carotid stenosis     dopplers 03/2011: 0-39% bilateral  . MDS (myelodysplastic syndrome) 12/16/2011    ALLERGIES:  is allergic to oxycodone hcl and prednisone.  MEDICATIONS:  Current Outpatient Prescriptions  Medication Sig Dispense Refill  . acetaminophen (TYLENOL) 500 MG tablet  Take 500 mg by mouth every 6 (six) hours as needed. For pain      . amLODipine (NORVASC) 10 MG tablet Take 10 mg by mouth every evening.       . cloNIDine (CATAPRES) 0.1 MG tablet Take 0.1 mg by mouth daily.       . furosemide (LASIX) 40 MG tablet Take 1 tablet (40 mg total) by mouth daily.  30 tablet  2  . isosorbide mononitrate (IMDUR) 30 MG 24 hr tablet Take 30 mg by mouth every evening.      . metoprolol (LOPRESSOR) 50 MG tablet Take 50 mg by mouth at bedtime.       . moxifloxacin (AVELOX) 400 MG tablet Take 1 tablet (400 mg total) by mouth daily.  5 tablet  0  . nitroGLYCERIN (NITROSTAT) 0.4 MG SL tablet Place 0.4 mg under the tongue every 5 (five) minutes as needed. For chest pain      . pantoprazole (PROTONIX) 40 MG tablet Take 1 tablet (40 mg total) by mouth daily.  30 tablet  2  . Tamsulosin HCl (FLOMAX) 0.4 MG CAPS Take 0.4 mg by mouth at bedtime. Take 2 tabs daily      . warfarin (COUMADIN) 5 MG tablet Take 5 mg by mouth daily.       Marland Kitchen warfarin (COUMADIN) 5 MG tablet Take as directed by coumadin clinic  30 tablet  2    SURGICAL HISTORY:  Past  Surgical History  Procedure Date  . Nose surgery 1985    deviated septum  . Total knee arthroplasty 2009    left  . Joint replacement 2009    left knee  . Appendectomy 1930's  . Tonsillectomy and adenoidectomy 1930's  . Coronary angioplasty with stent placement     3 procedures; 3 stents total  . Cataract extraction w/ intraocular lens  implant, bilateral   . Surgery scrotal / testicular 1971    "testicle crushed; removed"  . Esophagogastroduodenoscopy 12/09/2011    Procedure: ESOPHAGOGASTRODUODENOSCOPY (EGD);  Surgeon: Tracy Modena, MD;  Location: [facility] ENDOSCOPY;  Service: Endoscopy;  Laterality: N/A;  outlaw/ebp    REVIEW OF SYSTEMS:  A comprehensive review of systems was negative except for: Constitutional: positive for anorexia and fatigue   PHYSICAL EXAMINATION: General appearance: alert, cooperative and no distress Neck: no  adenopathy Lymph nodes: Cervical, supraclavicular, and axillary nodes normal. Resp: clear to auscultation bilaterally Cardio: regular rate and rhythm, S1, S2 normal, no murmur, click, rub or gallop GI: soft, non-tender; bowel sounds normal; no masses,  no organomegaly Extremities: extremities normal, atraumatic, no cyanosis or edema Neurologic: Alert and oriented X 3, normal strength and tone. Normal symmetric reflexes. Normal coordination and gait Skin: Reveals scattered areas of ecchymosis in various stages of healing. No active areas of bleeding  ECOG PERFORMANCE STATUS: 1 - Symptomatic but completely ambulatory  Blood pressure 104/61, pulse 73, temperature 97 F (36.1 C), resp. rate 20, height 6\' 1"  (1.854 m), weight 180 lb 6.4 oz (81.829 kg).  LABORATORY DATA: Lab Results  Component Value Date   WBC 2.4* 12/16/2011   HGB 8.3* 12/17/2011   HCT 26.7* 12/16/2011   MCV 94.3 12/16/2011   PLT 90* 12/16/2011      Chemistry      Component Value Date/Time   NA 139 12/16/2011 0952   NA 137 12/10/2011 0540   K 4.9 12/16/2011 0952   K 3.9 12/10/2011 0540   CL 115* 12/16/2011 0952   CL 106 12/10/2011 0540   CO2 16* 12/16/2011 0952   CO2 21 12/10/2011 0540   BUN 54.0* 12/16/2011 0952   BUN 37* 12/10/2011 0540   CREATININE 3.2 Repeated and Verified* 12/16/2011 0952   CREATININE 3.06* 12/10/2011 0540      Component Value Date/Time   CALCIUM 7.8* 12/16/2011 0952   CALCIUM 8.5 12/10/2011 0540   CALCIUM 11.0* 11/05/2011 1552   ALKPHOS 64 12/16/2011 0952   ALKPHOS 60 12/05/2011 0515   AST 29 12/16/2011 0952   AST 25 12/05/2011 0515   ALT 31 12/16/2011 0952   ALT 17 12/05/2011 0515   BILITOT 1.10 12/16/2011 0952   BILITOT 0.8 12/05/2011 0515       RADIOGRAPHIC STUDIES: No results found.  ASSESSMENT/PLAN: This is a very pleasant 78 years old white male with MDS. The patient was discussed with Dr. [name]. His current calcium of 7.8 and at this time will not require further treatment for hypercalcemia. We'll  continue to monitor this on future visits. His ANC is low at 0.6 and require 3 days of Neupogen at 480 mcg daily beginning today. He'll followup with Dr. Gwenyth Bouillon in one month with a repeat CBC differential, C. met and LDH.  Laural Benes, Mehreen Azizi E, PA-C   All questions were answered. The patient knows to call the clinic with any problems, questions or concerns. We can certainly see the patient much sooner if necessary.

## 2011-12-21 ENCOUNTER — Encounter (HOSPITAL_COMMUNITY): Payer: Self-pay

## 2011-12-21 ENCOUNTER — Encounter (HOSPITAL_COMMUNITY)
Admission: RE | Admit: 2011-12-21 | Discharge: 2011-12-21 | Disposition: A | Discharge status: Home / Self Care | Payer: Medicare Other | Source: Ambulatory Visit | Attending: Pulmonary Disease | Admitting: Pulmonary Disease

## 2011-12-21 HISTORY — DX: Cerebral infarction, unspecified: I63.9

## 2011-12-21 NOTE — Progress Notes (Signed)
Tracy [name] comes in today for orientation to Pulmonary Rehab accompanied by his wife.Marland Kitchen  He arrived by wheel chair  very short of breath.  Color-pale.  Oxygen level was taken, 92% at rest.  Breath sounds distant, some crackles in bases, more on left.  Oxygen was started @ [facility].  He did feel better immediately.   We proceeded with review of health history and medications. Nurse assessment was done.   6 min walk test done, he did stop for 3 rest breaks for total 2 min and 14 seconds.  Oxygen was stable 94 to 98% on 2L.  Heart rate, atrial fib 107-120.   Goals were set for Rehab.  He is planning to start exercise on 9/36/13.  Note to Dr Vassie Loll regarding oxygen.  Cathie Olden RN

## 2011-12-22 ENCOUNTER — Ambulatory Visit (INDEPENDENT_AMBULATORY_CARE_PROVIDER_SITE_OTHER): Payer: Medicare Other | Admitting: *Deleted

## 2011-12-22 DIAGNOSIS — Z7901 Long term (current) use of anticoagulants: Secondary | ICD-10-CM

## 2011-12-22 DIAGNOSIS — I4891 Unspecified atrial fibrillation: Secondary | ICD-10-CM

## 2011-12-22 LAB — POCT INR: INR: 8

## 2011-12-23 ENCOUNTER — Encounter (HOSPITAL_COMMUNITY): Payer: Self-pay

## 2011-12-23 ENCOUNTER — Encounter (HOSPITAL_COMMUNITY): Admission: RE | Admit: 2011-12-23 | Payer: Medicare Other | Source: Ambulatory Visit

## 2011-12-24 ENCOUNTER — Inpatient Hospital Stay (HOSPITAL_COMMUNITY)
Admission: EM | Admit: 2011-12-24 | Discharge: 2012-01-02 | DRG: 811 | Disposition: A | Discharge status: Home / Self Care | Payer: Medicare Other | Attending: Internal Medicine | Admitting: Internal Medicine

## 2011-12-24 ENCOUNTER — Encounter (HOSPITAL_COMMUNITY): Payer: Self-pay

## 2011-12-24 ENCOUNTER — Other Ambulatory Visit: Payer: Self-pay

## 2011-12-24 ENCOUNTER — Encounter (HOSPITAL_COMMUNITY)
Admission: RE | Admit: 2011-12-24 | Discharge: 2011-12-24 | Disposition: A | Discharge status: Home / Self Care | Payer: Medicare Other | Source: Ambulatory Visit | Attending: Nephrology | Admitting: Nephrology

## 2011-12-24 ENCOUNTER — Emergency Department (HOSPITAL_COMMUNITY): Payer: Medicare Other

## 2011-12-24 DIAGNOSIS — I498 Other specified cardiac arrhythmias: Secondary | ICD-10-CM

## 2011-12-24 DIAGNOSIS — I658 Occlusion and stenosis of other precerebral arteries: Secondary | ICD-10-CM | POA: Diagnosis present

## 2011-12-24 DIAGNOSIS — R040 Epistaxis: Secondary | ICD-10-CM | POA: Diagnosis present

## 2011-12-24 DIAGNOSIS — N184 Chronic kidney disease, stage 4 (severe): Secondary | ICD-10-CM | POA: Diagnosis present

## 2011-12-24 DIAGNOSIS — I6529 Occlusion and stenosis of unspecified carotid artery: Secondary | ICD-10-CM

## 2011-12-24 DIAGNOSIS — I214 Non-ST elevation (NSTEMI) myocardial infarction: Secondary | ICD-10-CM | POA: Diagnosis present

## 2011-12-24 DIAGNOSIS — Z96659 Presence of unspecified artificial knee joint: Secondary | ICD-10-CM

## 2011-12-24 DIAGNOSIS — E872 Acidosis, unspecified: Secondary | ICD-10-CM | POA: Diagnosis not present

## 2011-12-24 DIAGNOSIS — I10 Essential (primary) hypertension: Secondary | ICD-10-CM | POA: Diagnosis present

## 2011-12-24 DIAGNOSIS — R55 Syncope and collapse: Secondary | ICD-10-CM

## 2011-12-24 DIAGNOSIS — Z87891 Personal history of nicotine dependence: Secondary | ICD-10-CM

## 2011-12-24 DIAGNOSIS — I5033 Acute on chronic diastolic (congestive) heart failure: Secondary | ICD-10-CM | POA: Diagnosis present

## 2011-12-24 DIAGNOSIS — R791 Abnormal coagulation profile: Secondary | ICD-10-CM | POA: Diagnosis present

## 2011-12-24 DIAGNOSIS — D649 Anemia, unspecified: Secondary | ICD-10-CM

## 2011-12-24 DIAGNOSIS — J8410 Pulmonary fibrosis, unspecified: Secondary | ICD-10-CM

## 2011-12-24 DIAGNOSIS — Z8673 Personal history of transient ischemic attack (TIA), and cerebral infarction without residual deficits: Secondary | ICD-10-CM

## 2011-12-24 DIAGNOSIS — N259 Disorder resulting from impaired renal tubular function, unspecified: Secondary | ICD-10-CM

## 2011-12-24 DIAGNOSIS — Z9861 Coronary angioplasty status: Secondary | ICD-10-CM

## 2011-12-24 DIAGNOSIS — E785 Hyperlipidemia, unspecified: Secondary | ICD-10-CM | POA: Diagnosis present

## 2011-12-24 DIAGNOSIS — E875 Hyperkalemia: Secondary | ICD-10-CM

## 2011-12-24 DIAGNOSIS — Z79899 Other long term (current) drug therapy: Secondary | ICD-10-CM

## 2011-12-24 DIAGNOSIS — I4891 Unspecified atrial fibrillation: Secondary | ICD-10-CM | POA: Diagnosis present

## 2011-12-24 DIAGNOSIS — I4949 Other premature depolarization: Secondary | ICD-10-CM

## 2011-12-24 DIAGNOSIS — D696 Thrombocytopenia, unspecified: Secondary | ICD-10-CM | POA: Diagnosis present

## 2011-12-24 DIAGNOSIS — R911 Solitary pulmonary nodule: Secondary | ICD-10-CM

## 2011-12-24 DIAGNOSIS — Z7901 Long term (current) use of anticoagulants: Secondary | ICD-10-CM

## 2011-12-24 DIAGNOSIS — I5032 Chronic diastolic (congestive) heart failure: Secondary | ICD-10-CM | POA: Diagnosis present

## 2011-12-24 DIAGNOSIS — I2510 Atherosclerotic heart disease of native coronary artery without angina pectoris: Secondary | ICD-10-CM

## 2011-12-24 DIAGNOSIS — N433 Hydrocele, unspecified: Secondary | ICD-10-CM | POA: Diagnosis present

## 2011-12-24 DIAGNOSIS — D469 Myelodysplastic syndrome, unspecified: Secondary | ICD-10-CM | POA: Diagnosis present

## 2011-12-24 DIAGNOSIS — N400 Enlarged prostate without lower urinary tract symptoms: Secondary | ICD-10-CM | POA: Diagnosis present

## 2011-12-24 DIAGNOSIS — I129 Hypertensive chronic kidney disease with stage 1 through stage 4 chronic kidney disease, or unspecified chronic kidney disease: Secondary | ICD-10-CM | POA: Diagnosis present

## 2011-12-24 DIAGNOSIS — D62 Acute posthemorrhagic anemia: Principal | ICD-10-CM | POA: Diagnosis present

## 2011-12-24 DIAGNOSIS — N183 Chronic kidney disease, stage 3 unspecified: Secondary | ICD-10-CM | POA: Diagnosis present

## 2011-12-24 DIAGNOSIS — D61818 Other pancytopenia: Secondary | ICD-10-CM | POA: Diagnosis present

## 2011-12-24 DIAGNOSIS — M19079 Primary osteoarthritis, unspecified ankle and foot: Secondary | ICD-10-CM | POA: Diagnosis present

## 2011-12-24 DIAGNOSIS — N368 Other specified disorders of urethra: Secondary | ICD-10-CM | POA: Diagnosis present

## 2011-12-24 DIAGNOSIS — R0989 Other specified symptoms and signs involving the circulatory and respiratory systems: Secondary | ICD-10-CM

## 2011-12-24 DIAGNOSIS — T45515A Adverse effect of anticoagulants, initial encounter: Secondary | ICD-10-CM | POA: Diagnosis present

## 2011-12-24 DIAGNOSIS — D6832 Hemorrhagic disorder due to extrinsic circulating anticoagulants: Secondary | ICD-10-CM | POA: Diagnosis present

## 2011-12-24 DIAGNOSIS — R64 Cachexia: Secondary | ICD-10-CM

## 2011-12-24 DIAGNOSIS — I428 Other cardiomyopathies: Secondary | ICD-10-CM

## 2011-12-24 DIAGNOSIS — K922 Gastrointestinal hemorrhage, unspecified: Secondary | ICD-10-CM

## 2011-12-24 LAB — COMPREHENSIVE METABOLIC PANEL
BUN: 55 mg/dL — ABNORMAL HIGH (ref 6–23)
CO2: 15 mEq/L — ABNORMAL LOW (ref 19–32)
Calcium: 9 mg/dL (ref 8.4–10.5)
Creatinine, Ser: 3.28 mg/dL — ABNORMAL HIGH (ref 0.50–1.35)
GFR calc Af Amer: 18 mL/min — ABNORMAL LOW (ref >90)
GFR calc non Af Amer: 15 mL/min — ABNORMAL LOW (ref >90)
Glucose, Bld: 107 mg/dL — ABNORMAL HIGH (ref 70–99)
Total Protein: 6.7 g/dL (ref 6.0–8.3)

## 2011-12-24 LAB — POCT I-STAT TROPONIN I

## 2011-12-24 LAB — RENAL FUNCTION PANEL
BUN: 54 mg/dL — ABNORMAL HIGH (ref 6–23)
CO2: 15 mEq/L — ABNORMAL LOW (ref 19–32)
Calcium: 9 mg/dL (ref 8.4–10.5)
Glucose, Bld: 140 mg/dL — ABNORMAL HIGH (ref 70–99)
Phosphorus: 3.5 mg/dL (ref 2.3–4.6)
Potassium: 4.7 mEq/L (ref 3.5–5.1)

## 2011-12-24 LAB — CBC WITH DIFFERENTIAL/PLATELET
Basophils Absolute: 0 10*3/uL (ref 0.0–0.1)
Basophils Relative: 0 % (ref 0–1)
Eosinophils Absolute: 0 10*3/uL (ref 0.0–0.7)
Eosinophils Relative: 0 % (ref 0–5)
Hemoglobin: 6.6 g/dL — CL (ref 13.0–17.0)
Lymphocytes Relative: 22 % (ref 12–46)
Lymphs Abs: 0.9 10*3/uL (ref 0.7–4.0)
MCH: 29.3 pg (ref 26.0–34.0)
Monocytes Absolute: 1.2 10*3/uL — ABNORMAL HIGH (ref 0.1–1.0)
Monocytes Relative: 28 % — ABNORMAL HIGH (ref 3–12)
Myelocytes: 0 %
Neutro Abs: 2.2 10*3/uL (ref 1.7–7.7)
Neutrophils Relative %: 50 % (ref 43–77)
RBC: 2.25 MIL/uL — ABNORMAL LOW (ref 4.22–5.81)
WBC: 4.3 10*3/uL (ref 4.0–10.5)
nRBC: 0 /100 WBC

## 2011-12-24 LAB — OCCULT BLOOD, POC DEVICE: Fecal Occult Bld: POSITIVE

## 2011-12-24 LAB — PROTIME-INR: Prothrombin Time: 29.9 seconds — ABNORMAL HIGH (ref 11.6–15.2)

## 2011-12-24 LAB — MRSA PCR SCREENING: MRSA by PCR: NEGATIVE

## 2011-12-24 MED ORDER — ISOSORBIDE MONONITRATE ER 30 MG PO TB24
30.0000 mg | ORAL_TABLET | Freq: Every evening | ORAL | Status: DC
  Administered 2011-12-24 – 2012-01-01 (×9): 30 mg via ORAL
  Filled 2011-12-24 (×10): qty 1

## 2011-12-24 MED ORDER — ALBUTEROL SULFATE (5 MG/ML) 0.5% IN NEBU
2.5000 mg | INHALATION_SOLUTION | RESPIRATORY_TRACT | Status: DC | PRN
Start: 2011-12-24 — End: 2012-01-02

## 2011-12-24 MED ORDER — SODIUM CHLORIDE 0.9 % IJ SOLN
3.0000 mL | Freq: Two times a day (BID) | INTRAMUSCULAR | Status: DC
  Administered 2011-12-25 – 2012-01-01 (×14): 3 mL via INTRAVENOUS

## 2011-12-24 MED ORDER — PANTOPRAZOLE SODIUM 40 MG PO TBEC
40.0000 mg | DELAYED_RELEASE_TABLET | Freq: Two times a day (BID) | ORAL | Status: DC
  Administered 2011-12-25 – 2012-01-02 (×14): 40 mg via ORAL
  Filled 2011-12-24 (×13): qty 1

## 2011-12-24 MED ORDER — FUROSEMIDE 10 MG/ML IJ SOLN
INTRAMUSCULAR | Status: AC
  Filled 2011-12-24: qty 4

## 2011-12-24 MED ORDER — ACETAMINOPHEN 325 MG PO TABS: 650.0000 mg | ORAL_TABLET | Freq: Four times a day (QID) | ORAL | Status: DC | PRN

## 2011-12-24 MED ORDER — HYDROMORPHONE HCL PF 1 MG/ML IJ SOLN
1.0000 mg | INTRAMUSCULAR | Status: DC | PRN
  Administered 2011-12-28 – 2012-01-01 (×5): 1 mg via INTRAVENOUS
  Filled 2011-12-24 (×5): qty 1

## 2011-12-24 MED ORDER — HYDROCODONE-ACETAMINOPHEN 5-325 MG PO TABS: 1.0000 | ORAL_TABLET | ORAL | Status: DC | PRN

## 2011-12-24 MED ORDER — FUROSEMIDE 10 MG/ML IJ SOLN
20.0000 mg | Freq: Once | INTRAMUSCULAR | Status: AC
  Administered 2011-12-24: 20 mg via INTRAVENOUS

## 2011-12-24 MED ORDER — DARBEPOETIN ALFA-POLYSORBATE 150 MCG/0.75ML IJ SOLN: 150.0000 mg | INTRAMUSCULAR | Status: DC

## 2011-12-24 MED ORDER — ALUM & MAG HYDROXIDE-SIMETH 200-200-20 MG/5ML PO SUSP: 30.0000 mL | Freq: Four times a day (QID) | ORAL | Status: DC | PRN

## 2011-12-24 MED ORDER — DARBEPOETIN ALFA-POLYSORBATE 150 MCG/0.3ML IJ SOLN
150.0000 ug | INTRAMUSCULAR | Status: DC
  Administered 2011-12-31: 150 ug via SUBCUTANEOUS
  Filled 2011-12-24 (×3): qty 0.3

## 2011-12-24 MED ORDER — NITROGLYCERIN 0.4 MG SL SUBL: 0.4000 mg | SUBLINGUAL_TABLET | SUBLINGUAL | Status: DC | PRN

## 2011-12-24 MED ORDER — ACETAMINOPHEN 650 MG RE SUPP: 650.0000 mg | Freq: Four times a day (QID) | RECTAL | Status: DC | PRN

## 2011-12-24 MED ORDER — METOPROLOL TARTRATE 50 MG PO TABS
50.0000 mg | ORAL_TABLET | Freq: Every day | ORAL | Status: DC
  Administered 2011-12-24 – 2012-01-01 (×9): 50 mg via ORAL
  Filled 2011-12-24 (×10): qty 1

## 2011-12-24 MED ORDER — ONDANSETRON HCL 4 MG/2ML IJ SOLN: 4.0000 mg | Freq: Four times a day (QID) | INTRAMUSCULAR | Status: DC | PRN

## 2011-12-24 MED ORDER — DARBEPOETIN ALFA-POLYSORBATE 150 MCG/0.3ML IJ SOLN: 150.0000 ug | INTRAMUSCULAR | Status: DC

## 2011-12-24 MED ORDER — ONDANSETRON HCL 4 MG PO TABS: 4.0000 mg | ORAL_TABLET | Freq: Four times a day (QID) | ORAL | Status: DC | PRN

## 2011-12-24 MED ORDER — TAMSULOSIN HCL 0.4 MG PO CAPS
0.8000 mg | ORAL_CAPSULE | Freq: Every day | ORAL | Status: DC
  Administered 2011-12-24 – 2012-01-01 (×9): 0.8 mg via ORAL
  Filled 2011-12-24 (×10): qty 2

## 2011-12-24 NOTE — Progress Notes (Signed)
Patient's Hgb is 6.6.  Spoke with Joanette Gula, CMA for Dr. Caryn Section from [facility] to explain Hgb and symptoms.  Patient's INR 8.9 on Wednesday.  Patient states that his nose has been bleeding since Wednesday. Patient also complains of shortness of breath, vomiting, and feeling weak.  Stated that patient needs to go to the emergency department for further evaluation.  Patient en route to emergency department.

## 2011-12-24 NOTE — ED Notes (Addendum)
Pt here with INR of 8 and HGB of 6.6, was sent here by charge from short stay. Pt complains of feeling weak and sob, pt was supposed to come from hgb check and epogen and after blood results he sent him here no shot was given, pt also having nose bleeding.

## 2011-12-24 NOTE — ED Notes (Signed)
Report called to 2900 pt alert and talkative tolerating transfusion well.

## 2011-12-24 NOTE — H&P (Signed)
History and Physical       Hospital Admission Note Date: 12/24/2011  Patient name: Tracy [name] Medical record number: [id number] Date of birth: Dec 20, 1919 Age: 78 y.o. Gender: male PCP: [name], MD  Primary cardiologist: Dr. Antoine Poche  Chief Complaint:  Sent from short stay for symptomatic anemia  HPI: Patient is a 78 year old male with multiple medical problems including coronary disease, stage III CKD, chronic atrial fibrillation on Coumadin, currently on hold, history of hypertension, hyperlipidemia presented to ED with symptomatic anemia.patient had been recently discharged from the hospital on 12/10/2011 when he was admitted for upper GI bleed/hematemesis and hypercalcemia.patient had extensive workup for hypercalcemia and etiology seems to be unclear but resolved.patient had episode of nausea and hematemesis the during the previous admission and he underwent EGD which was essentially normal. He also was found to have pneumonia. Per patient, he had his INR checked on Wednesday 2 days ago and was 8.9. He was told to hold Coumadin and eat "greens". Patient presented to short stay today for Aranesp injection and was found to have a hemoglobin of 6.6. Patient was having shortness of breath with exertion, generalized weakness and fatigue and intermittent epistaxis for last 3 days. In route to the hospital patient also had an episode of nausea and vomiting today. He denied any chest pain, productive cough or any fever or chills.he denied any gross hematochezia or melena.  Review of Systems:  Constitutional: Denies fever, chills, diaphoresis, appetite change and fatigue.  HEENT: Denies photophobia, eye pain, redness, hearing loss, ear pain, congestion, sore throat, rhinorrhea, sneezing, mouth sores, trouble swallowing, neck pain, neck stiffness and tinnitus.   Respiratory: Denies SOB, DOE, cough, chest tightness,  and wheezing.     Cardiovascular: Denies chest pain, palpitations and leg swelling.  Gastrointestinal:please see history of present illness Genitourinary: patient states that he is undergoing prostate workup with Dr. Vernie Ammons Musculoskeletal: Denies myalgias, back pain, joint swelling, arthralgias and gait problem.  Skin: Denies pallor, rash and wound.  Neurological: patient describes having dizziness and generalized weakness Hematological: Denies adenopathy.Patient has bruising on his body with intermittent epistaxis for last 3 days Psychiatric/Behavioral: Denies suicidal ideation, mood changes, confusion, nervousness, sleep disturbance and agitation  Past Medical History: Past Medical History  Diagnosis Date  . CAD (coronary artery disease)     status post prior PCI to the obtuse marginal in 1997 and stenting to the mid to distal RCA in 2001; LHC 9/06:  LM ok, pLAD 50%, mLAD 60% then 60-70%, mRI 50%, mOM1 80-90% (small), pRCA and dRCA stents ok, PDA 50%, EF 65%;  Myoview 5/12: No ischemia, EF 64%.;  NSTEMI 11/12 tx medically   . Chronic kidney disease, stage III (moderate)   . TIA (transient ischemic attack)   . HTN (hypertension)   . Hyperlipidemia   . AF (atrial fibrillation) 02/16/11    Coumadin  . Arthritis     "in my left ankle"  . Chronic diastolic heart failure   . Anemia of chronic disease   . Carotid stenosis     dopplers 03/2011: 0-39% bilateral  . MDS (myelodysplastic syndrome) 12/16/2011  . Stroke     5 yrs ago.  Mini    Past Surgical History  Procedure Date  . Nose surgery 1985    deviated septum  . Total knee arthroplasty 2009    left  . Joint replacement 2009    left knee  . Appendectomy 1930's  . Tonsillectomy and adenoidectomy 1930's  . Coronary angioplasty with stent  placement     3 procedures; 3 stents total  . Cataract extraction w/ intraocular lens  implant, bilateral   . Surgery scrotal / testicular 1971    "testicle crushed; removed"  . Esophagogastroduodenoscopy  12/09/2011    Procedure: ESOPHAGOGASTRODUODENOSCOPY (EGD);  Surgeon: Willis Modena, MD;  Location: [facility] ENDOSCOPY;  Service: Endoscopy;  Laterality: N/A;  outlaw/ebp    Medications: Prior to Admission medications   Medication Sig Start Date End Date Taking? Authorizing Provider  acetaminophen (TYLENOL) 500 MG tablet Take 500 mg by mouth every 6 (six) hours as needed. For pain   Yes Historical Provider, MD  Darbepoetin Alfa-Polysorbate (ARANESP, ALBUMIN FREE,) 150 MCG/0.75ML SOLN Inject 150 mg as directed every 7 (seven) days. Friday.   Yes Historical Provider, MD  isosorbide mononitrate (IMDUR) 30 MG 24 hr tablet Take 30 mg by mouth every evening.   Yes Historical Provider, MD  metoprolol (LOPRESSOR) 50 MG tablet Take 50 mg by mouth at bedtime.  02/22/11 02/22/12 Yes Jessica A Hope, PA-C  nitroGLYCERIN (NITROSTAT) 0.4 MG SL tablet Place 0.4 mg under the tongue every 5 (five) minutes as needed. For chest pain   Yes Historical Provider, MD  Tamsulosin HCl (FLOMAX) 0.4 MG CAPS Take 0.8 mg by mouth at bedtime. Take 2 tabs daily   Yes Historical Provider, MD    Allergies:   Allergies  Allergen Reactions  . Oxycodone Hcl Other (See Comments)    Wanted to climb the wall  . Prednisone Other (See Comments)    Gain weight     Social History:  reports that he quit smoking about 33 years ago. His smoking use included Cigarettes and Pipe. He has a 80 pack-year smoking history. He has never used smokeless tobacco. He reports that he drinks alcohol. He reports that he does not use illicit drugs.  Family History: No family history on file.  Physical Exam: Blood pressure 162/102, pulse 113, temperature 97.5 F (36.4 C), temperature source Oral, resp. rate 25, height 6\' 1"  (1.854 m), SpO2 99.00%. General: Alert, awake, oriented x3, in no acute distress. HEENT: normocephalic, atraumatic, anicteric sclera, pink conjunctiva, pupils equal and reactive to light and accomodation, oropharynx clear Neck:  supple, no masses or lymphadenopathy, no goiter, no bruits, dried blood in nares, no active bleeding  Heart: Regular rate and rhythm, without murmurs, rubs or gallops. Lungs: Clear to auscultation bilaterally, no wheezing, rales or rhonchi. Abdomen: Soft, nontender, nondistended, positive bowel sounds, no masses. Extremities: No clubbing, cyanosis or edema with positive pedal pulses. Neuro: Grossly intact, no focal neurological deficits, strength 5/5 upper and lower extremities bilaterally Psych: alert and oriented x 3, normal mood and affect Skin: no rashes or lesions, warm and dry   LABS on Admission:  Basic Metabolic Panel:  Lab 12/24/11 4540 12/24/11 1430  NA 135 136  K 5.0 4.7  CL 106 106  CO2 15* 15*  GLUCOSE 107* 140*  BUN 55* 54*  CREATININE 3.28* 3.19*  CALCIUM 9.0 9.0  MG -- --  PHOS -- 3.5   Liver Function Tests:  Lab 12/24/11 1555 12/24/11 1430  AST 32 --  ALT 35 --  ALKPHOS 72 --  BILITOT 1.5* --  PROT 6.7 --  ALBUMIN 3.5 3.5   CBC:  Lab 12/24/11 1555 12/24/11 1421  WBC 4.3 --  NEUTROABS 2.2 --  HGB 6.6* 6.6*  HCT 21.1* --  MCV 93.8 --  PLT 32* --     Radiological Exams on Admission: Dg Chest 2 View  12/08/2011  *  RADIOLOGY REPORT*  Clinical Data: Pneumonia.  Short of breath.  Previous myocardial infarct.  Atrial fibrillation.  CHEST - 2 VIEW  Comparison: 12/04/2011  Findings: Changes of COPD are again seen.  Mild cardiomegaly stable.  Left lower lobe airspace disease shows no significant change, consistent with pneumonia.  Right lung is clear.  No evidence of pleural effusion.  IMPRESSION:  1. No significant change in left lower lobe airspace disease, consistent with pneumonia. 2.  Stable mild cardiomegaly and COPD.   Original Report Authenticated By: Danae Orleans, M.D.    Dg Chest 2 View  12/04/2011  *RADIOLOGY REPORT*  Clinical Data: COPD.  CHEST - 2 VIEW  Comparison: 08/04/2011  Findings: New airspace disease is seen in the left lower lobe,  consistent with pneumonia.  Changes of COPD again demonstrated. Mild cardiomegaly is stable.  No evidence of congestive heart failure or pleural effusion.  IMPRESSION:  1.  New left lower lobe airspace disease, consistent with pneumonia. 2.  COPD. 3.  Stable mild cardiomegaly.   Original Report Authenticated By: Danae Orleans, M.D.    Mr Mrcp  12/06/2011  *RADIOLOGY REPORT*  Clinical Data:  Weight loss, dilated common duct and pancreatic duct on prior exam and possible common duct stone.  Anorexia.  MRI ABDOMEN WITHOUT CONTRAST (MRCP)  Technique: Multiplanar multisequence MR imaging of the abdomen was performed, including heavily T2-weighted images of the biliary and pancreatic ducts.  Three-dimensional MR images were rendered by post processing of the original MR data.  Comparison:  CT abdomen/pelvis 10/13/2011  Findings:  Trace perinephric stranding and bilateral T2 hyperintense renal cortical cysts are again noted, largest left lower renal pole 3.5 cm.  These are stable allowing for differences in technique.  No hydronephrosis.  Common duct is mildly dilated measuring 1.1 cm at its midportion.  No pancreatic ductal dilatation.  T2 hyperintense too small to characterize 4 mm hyperintense focus left hepatic lobe lateral segment image 24 series 3 is most likely a cyst or possibly biliary hamartoma.  This may be too small to be previously visualized.  Spleen, adrenal glands, pancreas, and gallbladder are unremarkable.  No lymphadenopathy or ascites.  Disc degenerative changes are noted in the spine.  Some series are degraded by patient respiratory motion. Allowing for this, on heavily T2-weighted images, there is no focal filling defect within the common duct.  There is gradual tapering to the level of the ampulla.  The liver is markedly hypointense on T1 and T2-weighted imaging which may indicate iron deposition.  No lymphadenopathy.  IMPRESSION: Mild common duct dilatation to 1 cm without filling defect allowing  for mild motion artifact.  Gradual tapering to the ampulla is noted and there is no intrahepatic ductal dilatation.  Hepatic hypointensity which may indicate iron deposition.   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  No secondary imaging findings to suggest cirrhosis.   Original Report Authenticated By: Harrel Lemon, M.D.    Dg Chest Port 1 View  12/24/2011  *RADIOLOGY REPORT*  Clinical Data: Hypertension and anemia.  PORTABLE CHEST - 1 VIEW  Comparison: 12/08/2011  Findings: Left lower lobe airspace disease seen previously persists without substantial interval change.  Right lung remains clear. Interstitial markings are diffusely coarsened with chronic features. The cardiopericardial silhouette is enlarged.  IMPRESSION: No substantial change.  Cardiomegaly with persistent airspace opacity involving the left mid and lower lung.   Original Report Authenticated By: ERIC A. MANSELL, M.D.     Assessment/Plan  Present on Admission:   .Anemia associated with acute blood loss: hemoglobin 6.6 with symptomatic anemia and elevated troponins, likely secondary to Coumadin induced coagulopathy - admit to step down overnight, we'll transfuse 2 units of packed RBCs, continue serial cardiac enzymes - Await stool occult last,serial H&H, patient is not having any gross hematochezia or melena or active epistaxis. If patient starts to have any active bleeding, will reverse Coumadin with vitamin K  .Warfarin-induced coagulopathy: - if patient starts to have any active bleeding, will reverse with vitamin K. Currently INR is 3.0. It is to be noted that patient had upper GI bleed/hematemesis during the previous admission, EGD was done and was essentially negative. - Due to recurrent bleeding and symptomatic anemia, patient may not be a Coumadin candidate at this point.   .NSTEMI (non-ST elevated myocardial infarction): likely demand ischemia, asymptomatic except dyspnea -  Will continue serial cardiac enzymes, continue beta blocker and statin. Not on aspirin secondary to bleeding, coagulopathy, and intermittent epistaxis  .MDS (myelodysplastic syndrome) with Pancytopenia: - monitor counts and follow patient with oncology  .HYPERLIPIDEMIA: Obtain lipid panel  .Essential hypertension, benign: - Restart metoprolol and Imdur  .Epistaxis: Intermittent epistaxis, currently no active bleeding  .Acute onCKD (chronic kidney disease), stage III: - likely worsened due to symptomatic anemia, will continue blood transfusion  .Chronic diastolic heart failure: currently stable, compensated, will give Lasix in between the transfusions  .Atrial fibrillation: Continue beta blocker, INR 3.0, Coumadin on hold  DVT prophylaxis: INR therapeutic, SCDs  CODE STATUS: full code  Further plan will depend as patient's clinical course evolves and further radiologic and laboratory data become available.   Time Spent on Admission: 1hour  Dequante Tremaine M.D. [facility] 12/24/2011, 6:36 PM Pager: [phone]  If 7PM-7AM, please contact night-coverage [email or web address] Password [email or web address]

## 2011-12-24 NOTE — ED Provider Notes (Signed)
History   This chart was scribed for Tracy Horn, MD by Melba Coon. The patient was seen in room 2917/2917-01 and the patient's care was started at 3:13PM.    CSN: [id number]  Arrival date & time 12/24/11  1445   First MD Initiated Contact with Patient 12/24/11 1502      Chief Complaint  Patient presents with  . Anemia    (Consider location/radiation/quality/duration/timing/severity/associated sxs/prior treatment) The history is provided by the patient. No language interpreter was used.   Tracy [name] is a 78 y.o. male who presents to the Emergency Department complaining of persistent, moderate to severe anemia with an onset today. Mr  Shorey was sent here from [facility] due to abnormal hemoglobin test. He went to his PCP today for a routine physical. Prior to the physical today, he has had a couple weeks of faituge, weakness, and intermittent SOB that is worsening. He had his INR checked which was over 8 was stopped on his coumadin on Wednesday. Also reports nosebleeds intermittently for 3 days. Normal appetite compared to baseline. Reports bruises present all over his body due to multiple hospitalizations. Denies melena and CP. Hx of heart attack with irregular rhythm (a fib). Hx of low iron with unknown cause. No Hx of cancer. No other pertinent medical symptoms.  Past Medical History  Diagnosis Date  . CAD (coronary artery disease)     status post prior PCI to the obtuse marginal in 1997 and stenting to the mid to distal RCA in 2001; LHC 9/06:  LM ok, pLAD 50%, mLAD 60% then 60-70%, mRI 50%, mOM1 80-90% (small), pRCA and dRCA stents ok, PDA 50%, EF 65%;  Myoview 5/12: No ischemia, EF 64%.;  NSTEMI 11/12 tx medically   . Chronic kidney disease, stage III (moderate)   . TIA (transient ischemic attack)   . HTN (hypertension)   . Hyperlipidemia   . AF (atrial fibrillation) 02/16/11    Coumadin  . Arthritis     "in my left ankle"  . Chronic diastolic heart failure   . Anemia of  chronic disease   . Carotid stenosis     dopplers 03/2011: 0-39% bilateral  . MDS (myelodysplastic syndrome) 12/16/2011  . Stroke     5 yrs ago.  Mini     Past Surgical History  Procedure Date  . Nose surgery 1985    deviated septum  . Total knee arthroplasty 2009    left  . Joint replacement 2009    left knee  . Appendectomy 1930's  . Tonsillectomy and adenoidectomy 1930's  . Coronary angioplasty with stent placement     3 procedures; 3 stents total  . Cataract extraction w/ intraocular lens  implant, bilateral   . Surgery scrotal / testicular 1971    "testicle crushed; removed"  . Esophagogastroduodenoscopy 12/09/2011    Procedure: ESOPHAGOGASTRODUODENOSCOPY (EGD);  Surgeon: Willis Modena, MD;  Location: [facility] ENDOSCOPY;  Service: Endoscopy;  Laterality: N/A;  outlaw/ebp    No family history on file.  History  Substance Use Topics  . Smoking status: Former Smoker -- 2.0 packs/day for 40 years    Types: Cigarettes, Pipe    Quit date: 08/24/1978  . Smokeless tobacco: Never Used   Comment: 1980 quit smoking cigarrettes and pipe  . Alcohol Use: Yes     "glass of wine or beer once in awhile"      Review of Systems 10 Systems reviewed and all are negative for acute change except as noted in the  HPI.   Allergies  Oxycodone hcl and Prednisone  Home Medications   No current outpatient prescriptions on file.  BP 128/80  Pulse 90  Temp 97.7 F (36.5 C) (Oral)  Resp 20  Ht 7\' 3"  (2.21 m)  Wt 182 lb 1.6 oz (82.6 kg)  BMI 16.92 kg/m2  SpO2 91%  Physical Exam  Nursing note and vitals reviewed. Constitutional: He is oriented to person, place, and time. He appears well-developed and well-nourished. No distress.  HENT:  Head: Normocephalic and atraumatic.       No active nasal bleeding.  Eyes: EOM are normal.  Neck: Neck supple. No tracheal deviation present.  Cardiovascular: Regular rhythm and normal heart sounds.   No murmur heard.      tachycardic    Pulmonary/Chest: Effort normal. No respiratory distress.  Genitourinary:       Rectal exam: light brown stool with hemoccult pending  Musculoskeletal: Normal range of motion.  Neurological: He is alert and oriented to person, place, and time.  Skin: Skin is warm and dry.  Psychiatric: He has a normal mood and affect. His behavior is normal.    ED Course  Procedures (including critical care time) ECG: H. or fibrillation, ventricular rate 88, left axis deviation, incomplete right bundle branch block, inferior Q waves, lateral ST and T-wave abnormality, no significant change compared with 12/04/2011  COORDINATION OF CARE:  3:14PM - CXR, EKG, and blood w/u will be ordered for the pt to see if he is eligible for blood transfusion and will be transferred to CDU 4:54PM - triad has been paged  CRITICAL CARE Performed by: Tracy [name]   Total critical care time:  Critical care time was exclusive of separately billable procedures and treating other patients.  Critical care was necessary to treat or prevent imminent or life-threatening deterioration.  Critical care was time spent personally by me on the following activities: development of treatment plan with patient and/or surrogate as well as nursing, discussions with consultants, evaluation of patient's response to treatment, examination of patient, obtaining history from patient or surrogate, ordering and performing treatments and interventions, ordering and review of laboratory studies, ordering and review of radiographic studies, pulse oximetry and re-evaluation of patient's condition.   Pt stable in ED with no significant deterioration in condition.Patient / Family / Caregiver informed of clinical course, understand medical decision-making process, and agree with plan.  Labs Reviewed  PROTIME-INR - Abnormal; Notable for the following:    Prothrombin Time 29.9 (*)     INR 3.05 (*)     All other components within normal limits   CBC WITH DIFFERENTIAL - Abnormal; Notable for the following:    RBC 2.25 (*)     Hemoglobin 6.6 (*)     HCT 21.1 (*)     RDW 16.5 (*)     Platelets 32 (*)  CONSISTENT WITH PREVIOUS RESULT   Monocytes Relative 28 (*)     Monocytes Absolute 1.2 (*)     All other components within normal limits  COMPREHENSIVE METABOLIC PANEL - Abnormal; Notable for the following:    CO2 15 (*)     Glucose, Bld 107 (*)     BUN 55 (*)     Creatinine, Ser 3.28 (*)     Total Bilirubin 1.5 (*)     GFR calc non Af Amer 15 (*)     GFR calc Af Amer 18 (*)     All other components within normal limits  POCT I-STAT  TROPONIN I - Abnormal; Notable for the following:    Troponin i, poc 0.64 (*)     All other components within normal limits  PROTIME-INR - Abnormal; Notable for the following:    Prothrombin Time 26.8 (*)     INR 2.63 (*)     All other components within normal limits  BASIC METABOLIC PANEL - Abnormal; Notable for the following:    Sodium 134 (*)     CO2 14 (*)     Glucose, Bld 116 (*)     BUN 55 (*)     Creatinine, Ser 3.26 (*)     GFR calc non Af Amer 15 (*)     GFR calc Af Amer 18 (*)     All other components within normal limits  TROPONIN I - Abnormal; Notable for the following:    Troponin I 1.31 (*)     All other components within normal limits  CBC - Abnormal; Notable for the following:    RBC 2.63 (*)     Hemoglobin 7.6 (*)     HCT 23.9 (*)     RDW 16.0 (*)     Platelets 24 (*)     All other components within normal limits  TYPE AND SCREEN  OCCULT BLOOD, POC DEVICE  PREPARE RBC (CROSSMATCH)  PREPARE RBC (CROSSMATCH)  CK TOTAL AND CKMB  MRSA PCR SCREENING  URINE CULTURE  TROPONIN I  CK TOTAL AND CKMB  CBC   Dg Chest Port 1 View  12/24/2011  *RADIOLOGY REPORT*  Clinical Data: Hypertension and anemia.  PORTABLE CHEST - 1 VIEW  Comparison: 12/08/2011  Findings: Left lower lobe airspace disease seen previously persists without substantial interval change.  Right lung remains  clear. Interstitial markings are diffusely coarsened with chronic features. The cardiopericardial silhouette is enlarged.  IMPRESSION: No substantial change.  Cardiomegaly with persistent airspace opacity involving the left mid and lower lung.   Original Report Authenticated By: ERIC A. MANSELL, M.D.      1. Anemia   2. Anticoagulated on Coumadin   3. NSTEMI (non-ST elevated myocardial infarction)   4. Atrial fibrillation   5. GI bleed   6. Anemia associated with acute blood loss   7. Chronic diastolic heart failure   8. CKD (chronic kidney disease), stage III   9. Coronary atherosclerosis of unspecified type of vessel, native or graft   10. Epistaxis   11. Essential hypertension, benign   12. MDS (myelodysplastic syndrome)       MDM  I personally performed the services described in this documentation, which was scribed in my presence. The recorded information has been reviewed and considered.      Tracy Horn, MD 12/25/11 [phone]

## 2011-12-24 NOTE — ED Notes (Signed)
Critical value called by lab hgb 6.6

## 2011-12-24 NOTE — ED Notes (Signed)
Blood bank called blood ready for transfusion.

## 2011-12-25 DIAGNOSIS — I4891 Unspecified atrial fibrillation: Secondary | ICD-10-CM

## 2011-12-25 DIAGNOSIS — Z5181 Encounter for therapeutic drug level monitoring: Secondary | ICD-10-CM

## 2011-12-25 DIAGNOSIS — Z7901 Long term (current) use of anticoagulants: Secondary | ICD-10-CM

## 2011-12-25 DIAGNOSIS — R040 Epistaxis: Secondary | ICD-10-CM

## 2011-12-25 DIAGNOSIS — D62 Acute posthemorrhagic anemia: Principal | ICD-10-CM

## 2011-12-25 LAB — CBC
MCH: 28.9 pg (ref 26.0–34.0)
MCHC: 31.8 g/dL (ref 30.0–36.0)
MCV: 91.2 fL (ref 78.0–100.0)
Platelets: 24 10*3/uL — CL (ref 150–400)
Platelets: 28 10*3/uL — CL (ref 150–400)
RBC: 2.6 MIL/uL — ABNORMAL LOW (ref 4.22–5.81)
RDW: 16 % — ABNORMAL HIGH (ref 11.5–15.5)
RDW: 16.2 % — ABNORMAL HIGH (ref 11.5–15.5)
WBC: 4.4 10*3/uL (ref 4.0–10.5)

## 2011-12-25 LAB — BASIC METABOLIC PANEL
BUN: 55 mg/dL — ABNORMAL HIGH (ref 6–23)
CO2: 14 mEq/L — ABNORMAL LOW (ref 19–32)
Calcium: 8.7 mg/dL (ref 8.4–10.5)
Chloride: 106 mEq/L (ref 96–112)
Creatinine, Ser: 3.26 mg/dL — ABNORMAL HIGH (ref 0.50–1.35)

## 2011-12-25 LAB — CK TOTAL AND CKMB (NOT AT ARMC)
CK, MB: 3.6 ng/mL (ref 0.3–4.0)
CK, MB: 3.7 ng/mL (ref 0.3–4.0)
Relative Index: INVALID (ref 0.0–2.5)
Relative Index: INVALID (ref 0.0–2.5)
Total CK: 97 U/L (ref 7–232)

## 2011-12-25 LAB — TROPONIN I
Troponin I: 1.31 ng/mL (ref <0.30)
Troponin I: 0.96 ng/mL (ref <0.30)

## 2011-12-25 LAB — HEMOGLOBIN AND HEMATOCRIT, BLOOD: HCT: 26.6 % — ABNORMAL LOW (ref 39.0–52.0)

## 2011-12-25 LAB — PROTIME-INR
INR: 2.63 — ABNORMAL HIGH (ref 0.00–1.49)
Prothrombin Time: 26.8 seconds — ABNORMAL HIGH (ref 11.6–15.2)

## 2011-12-25 MED ORDER — ENSURE COMPLETE PO LIQD
237.0000 mL | Freq: Two times a day (BID) | ORAL | Status: DC
  Administered 2011-12-26 – 2012-01-02 (×14): 237 mL via ORAL

## 2011-12-25 MED ORDER — VITAMIN K1 10 MG/ML IJ SOLN
2.0000 mg | Freq: Once | INTRAMUSCULAR | Status: AC
  Administered 2011-12-25: 2 mg via SUBCUTANEOUS
  Filled 2011-12-25: qty 0.2

## 2011-12-25 NOTE — Progress Notes (Signed)
CRITICAL VALUE ALERT  Critical value received: platetelet=24 Date of notification: 12/25/11 Time of notification:  Critical value read back:yes  Nurse who received alert:  Bolivar Haw  MD notified (1st page):T. Claiborne Billings Time of first page: 0440 MD notified (2nd page):  Time of second page:  Responding MD: Benedetto Coons Time MD responded: [phone]

## 2011-12-25 NOTE — Progress Notes (Signed)
INITIAL ADULT NUTRITION ASSESSMENT Date: 12/25/2011   Time: 10:59 AM  Reason for Assessment: Malnutrition Screening  INTERVENTION: 1. Ensure Complete po BID, each supplement provides 350 kcal and 13 grams of protein. 2. Discussed importance of weight maintenance and intake of protein rich foods 3. RD to continue to follow nutrition care plan  DOCUMENTATION CODES Per approved criteria  -Not Applicable   ASSESSMENT: Male 78 y.o.  Dx: Anemia associated with acute blood loss  Hx:  Past Medical History  Diagnosis Date  . CAD (coronary artery disease)     status post prior PCI to the obtuse marginal in 1997 and stenting to the mid to distal RCA in 2001; LHC 9/06:  LM ok, pLAD 50%, mLAD 60% then 60-70%, mRI 50%, mOM1 80-90% (small), pRCA and dRCA stents ok, PDA 50%, EF 65%;  Myoview 5/12: No ischemia, EF 64%.;  NSTEMI 11/12 tx medically   . Chronic kidney disease, stage III (moderate)   . TIA (transient ischemic attack)   . HTN (hypertension)   . Hyperlipidemia   . AF (atrial fibrillation) 02/16/11    Coumadin  . Arthritis     "in my left ankle"  . Chronic diastolic heart failure   . Anemia of chronic disease   . Carotid stenosis     dopplers 03/2011: 0-39% bilateral  . MDS (myelodysplastic syndrome) 12/16/2011  . Stroke     5 yrs ago.  Mini    Past Surgical History  Procedure Date  . Nose surgery 1985    deviated septum  . Total knee arthroplasty 2009    left  . Joint replacement 2009    left knee  . Appendectomy 1930's  . Tonsillectomy and adenoidectomy 1930's  . Coronary angioplasty with stent placement     3 procedures; 3 stents total  . Cataract extraction w/ intraocular lens  implant, bilateral   . Surgery scrotal / testicular 1971    "testicle crushed; removed"  . Esophagogastroduodenoscopy 12/09/2011    Procedure: ESOPHAGOGASTRODUODENOSCOPY (EGD);  Surgeon: Willis Modena, MD;  Location: [facility] ENDOSCOPY;  Service: Endoscopy;  Laterality: N/A;  outlaw/ebp   Related  Meds:     . darbepoetin (ARANESP) injection - NON-DIALYSIS  150 mcg Subcutaneous Q Fri-1800  . furosemide      . furosemide  20 mg Intravenous Once  . isosorbide mononitrate  30 mg Oral QPM  . metoprolol  50 mg Oral QHS  . pantoprazole  40 mg Oral BID AC  . sodium chloride  3 mL Intravenous Q12H  . Tamsulosin HCl  0.8 mg Oral QHS  . DISCONTD: Darbepoetin Alfa-Polysorbate  150 mg Injection Q7 days   Ht: 6' (182.9 cm)  Wt: 182 lb 1.6 oz (82.6 kg)  Ideal Wt: 80.9 kg % Ideal Wt: 102%  Wt Readings from Last 15 Encounters:  12/24/11 182 lb 1.6 oz (82.6 kg)  12/16/11 180 lb 6.4 oz (81.829 kg)  12/09/11 157 lb (71.215 kg)  12/09/11 157 lb (71.215 kg)  11/26/11 177 lb 9.6 oz (80.559 kg)  11/25/11 174 lb 12.8 oz (79.289 kg)  10/12/11 183 lb (83.008 kg)  09/28/11 180 lb 11.2 oz (81.965 kg)  08/25/11 190 lb 3.2 oz (86.274 kg)  08/24/11 189 lb (85.73 kg)  08/04/11 187 lb (84.823 kg)  06/30/11 191 lb (86.637 kg)  06/10/11 195 lb (88.451 kg)  06/09/11 195 lb 12.8 oz (88.814 kg)  06/03/11 195 lb (88.451 kg)  Usual Wt: 195 lb % Usual Wt: 93%; 7% wt loss x 6  months  Body mass index is 24.70 kg/(m^2). Weight is WNL  Food/Nutrition Related Hx: pt reports poor appetite x 6 months associated with weight loss  Labs:  CMP     Component Value Date/Time   NA 134* 12/25/2011 0032   NA 139 12/16/2011 0952   K 5.0 12/25/2011 0032   K 4.9 12/16/2011 0952   CL 106 12/25/2011 0032   CL 115* 12/16/2011 0952   CO2 14* 12/25/2011 0032   CO2 16* 12/16/2011 0952   GLUCOSE 116* 12/25/2011 0032   GLUCOSE 126* 12/16/2011 0952   BUN 55* 12/25/2011 0032   BUN 54.0* 12/16/2011 0952   CREATININE 3.26* 12/25/2011 0032   CREATININE 3.2 Repeated and Verified* 12/16/2011 0952   CALCIUM 8.7 12/25/2011 0032   CALCIUM 7.8* 12/16/2011 0952   CALCIUM 11.0* 11/05/2011 1552   PROT 6.7 12/24/2011 1555   PROT 6.0* 12/16/2011 0952   ALBUMIN 3.5 12/24/2011 1555   ALBUMIN 3.2* 12/16/2011 0952   AST 32 12/24/2011 1555   AST 29  12/16/2011 0952   ALT 35 12/24/2011 1555   ALT 31 12/16/2011 0952   ALKPHOS 72 12/24/2011 1555   ALKPHOS 64 12/16/2011 0952   BILITOT 1.5* 12/24/2011 1555   BILITOT 1.10 12/16/2011 0952   GFRNONAA 15* 12/25/2011 0032   GFRAA 18* 12/25/2011 0032    Phosphorus  Date/Time Value Range Status  12/24/2011  2:30 PM 3.5  2.3 - 4.6 mg/dL Final  04/16/1476  2:95 AM 2.4  2.3 - 4.6 mg/dL Final  09/16/3084  5:78 AM 2.4  2.3 - 4.6 mg/dL Final    Magnesium  Date/Time Value Range Status  12/08/2011  4:00 PM 1.7  1.5 - 2.5 mg/dL Final  46/96/2952  8:41 PM 1.8  1.5 - 2.5 mg/dL Final    Intake/Output Summary (Last 24 hours) at 12/25/11 1101 Last data filed at 12/25/11 3244  Gross per 24 hour  Intake   29.5 ml  Output    925 ml  Net -895.5 ml   Diet Order: Cardiac  Supplements/Tube Feeding: none  IVF:    Estimated Nutritional Needs:   Kcal: 1500 - 1700 kcal Protein:  65 - 75 grams Fluid: 1.8 - 2 liters daily  Admitted from short stay for symptomatic anemia. Noted recent admission for hypercalcemia and GIB.  Pt reports that he has ongoing unintentional wt loss x 6 months. Per chart, he has had 7% wt loss x 6 months. This is not significant. Pt states that his weight loss is 2/2 recent poor intake from lack of taste and recent hospitalizations. Pt is at nutrition risk given poor intake and advanced age.  Skin: No skin breakdown noted. Notable labs: sodium is low; CBG of 116 Pertinent meds: aranesp and protonix Last BM: PTA  Pt states that he likes Ensure and Boost, states that he drinks it at home. Agreeable to receiving it here.  NUTRITION DIAGNOSIS: Inadequate oral intake r/t decline in taste AEB pt dietary recall and ongoing wt loss.  MONITORING/EVALUATION(Goals): Goal: Pt to meet >/= 90% of their estimated nutrition needs Monitor: weight trends, lab trends, I/O's, PO intake, supplement tolerance  EDUCATION NEEDS: -Education needs addressed  Jarold Motto MS, RD, LDN Pager:  [phone] After-hours pager: [phone]

## 2011-12-25 NOTE — Progress Notes (Signed)
[facility] PROGRESS NOTE  Tracy [name] ZOX:[id number] DOB: 11-18-1919 DOA: 12/24/2011 PCP: [name], MD  Assessment/Plan: Principal Problem:  *Anemia  Suspect this is related to recent acute blood loss superimposed upon myelodysplasia. He has recievd 2 U of PRBC. Hgb is only 7.6 (checked twice) therefore, will order one more unit of blood for him now. He did receive his Aranesp yesterday.   Active Problems:   Warfarin-induced coagulopathy INR improved but he continues to have ongoing oozing of blood from his nose- He is due to have prostate surgery next week and was scheduled to discontinue his Coumadin this coming Monday- will give him a small dose of Vit K now to allow reversal of Coumadin   Epistaxis See above   Atrial fibrillation Currently rate controlled- cont current meds   Chronic diastolic heart failure He states he no longer takes Lasix- He was given Lasix in between the units of blood. Currently he is not having an acute problem- lungs quite clear on exam.    Pancytopenia/ MDS Under care of Dr [name]- received Aranesp and "shots for his low white count" as outpatient. Follow levels.    CKD (chronic kidney disease), stage IV   Essential hypertension, benign Cont Lopressor   CAD/ NSTEMI (non-ST elevated myocardial infarction) Cont Imdur and B blocker- not on ASA- agree that this is probably demand ischemia. Will follow troponins.   Code Status: full code Disposition Plan: follow in SDU DVT prophylaxis: currently INR is theraputic   Brief narrative: Patient is a 78 year old male with multiple medical problems including coronary disease, stage III CKD, chronic atrial fibrillation on Coumadin, currently on hold, history of hypertension, hyperlipidemia presented to ED with symptomatic anemia.patient had been recently discharged from the hospital on 12/10/2011 when he was admitted for upper GI bleed/hematemesis and hypercalcemia.patient had extensive workup  for hypercalcemia and etiology seems to be unclear but resolved.patient had episode of nausea and hematemesis the during the previous admission and he underwent EGD which was essentially normal. He also was found to have pneumonia. Per patient, he had his INR checked on Wednesday 2 days ago and was 8.9. He was told to hold Coumadin and eat "greens". Patient presented to short stay today for Aranesp injection and was found to have a hemoglobin of 6.6. Patient was having shortness of breath with exertion, generalized weakness and fatigue and intermittent epistaxis for last 3 days.   HPI/Subjective: Pt sitting up in bed, continuously dabbing his nose with tissue due to continued oozing of blood. No complaints of dyspnea or chest pain.  Objective: Filed Vitals:   12/25/11 1500 12/25/11 1515 12/25/11 1544 12/25/11 1630  BP:      Pulse:      Temp: 98.7 F (37.1 C) 98.6 F (37 C) 97.7 F (36.5 C) 97 F (36.1 C)  TempSrc: Oral Oral Oral Oral  Resp: 20  20   Height:      Weight:      SpO2:        Intake/Output Summary (Last 24 hours) at 12/25/11 1653 Last data filed at 12/25/11 1500  Gross per 24 hour  Intake     42 ml  Output    925 ml  Net   -883 ml    Exam:   General:  Alert, no acute distress  Cardiovascular: RRR, no murmurs  Respiratory: CTA b/l   Abdomen: soft, NT, ND, BS+  Ext: no c/c/e  Data Reviewed: Basic Metabolic Panel:  Lab 12/25/11 8119 12/24/11 1555  12/24/11 1430  NA 134* 135 136  K 5.0 5.0 4.7  CL 106 106 106  CO2 14* 15* 15*  GLUCOSE 116* 107* 140*  BUN 55* 55* 54*  CREATININE 3.26* 3.28* 3.19*  CALCIUM 8.7 9.0 9.0  MG -- -- --  PHOS -- -- 3.5   Liver Function Tests:  Lab 12/24/11 1555 12/24/11 1430  AST 32 --  ALT 35 --  ALKPHOS 72 --  BILITOT 1.5* --  PROT 6.7 --  ALBUMIN 3.5 3.5   No results found for this basename: LIPASE:5,AMYLASE:5 in the last 168 hours No results found for this basename: AMMONIA:5 in the last 168 hours CBC:  Lab  12/25/11 0932 12/25/11 0031 12/24/11 1555 12/24/11 1421  WBC 4.4 4.3 4.3 --  NEUTROABS -- -- 2.2 --  HGB 7.6* 7.6* 6.6* 6.6*  HCT 23.7* 23.9* 21.1* --  MCV 91.2 90.9 93.8 --  PLT 28* 24* 32* --   Cardiac Enzymes:  Lab 12/25/11 0931 12/25/11 0031  CKTOTAL 97 95  CKMB 3.7 3.6  CKMBINDEX -- --  TROPONINI 1.21* 1.31*   BNP (last 3 results)  Basename 12/08/11 1600 08/04/11 1241 02/16/11 1044  PROBNP 9654.0* 601.0* 3945.0*   CBG: No results found for this basename: GLUCAP:5 in the last 168 hours  Recent Results (from the past 240 hour(s))  MRSA PCR SCREENING     Status: Normal   Collection Time   12/24/11  7:24 PM      Component Value Range Status Comment   MRSA by PCR NEGATIVE  NEGATIVE Final      Studies: Dg Chest 2 View  12/08/2011  *RADIOLOGY REPORT*  Clinical Data: Pneumonia.  Short of breath.  Previous myocardial infarct.  Atrial fibrillation.  CHEST - 2 VIEW  Comparison: 12/04/2011  Findings: Changes of COPD are again seen.  Mild cardiomegaly stable.  Left lower lobe airspace disease shows no significant change, consistent with pneumonia.  Right lung is clear.  No evidence of pleural effusion.  IMPRESSION:  1. No significant change in left lower lobe airspace disease, consistent with pneumonia. 2.  Stable mild cardiomegaly and COPD.   Original Report Authenticated By: Danae Orleans, M.D.    Dg Chest 2 View  12/04/2011  *RADIOLOGY REPORT*  Clinical Data: COPD.  CHEST - 2 VIEW  Comparison: 08/04/2011  Findings: New airspace disease is seen in the left lower lobe, consistent with pneumonia.  Changes of COPD again demonstrated. Mild cardiomegaly is stable.  No evidence of congestive heart failure or pleural effusion.  IMPRESSION:  1.  New left lower lobe airspace disease, consistent with pneumonia. 2.  COPD. 3.  Stable mild cardiomegaly.   Original Report Authenticated By: Danae Orleans, M.D.    Mr Mrcp  12/06/2011  *RADIOLOGY REPORT*  Clinical Data:  Weight loss, dilated common  duct and pancreatic duct on prior exam and possible common duct stone.  Anorexia.  MRI ABDOMEN WITHOUT CONTRAST (MRCP)  Technique: Multiplanar multisequence MR imaging of the abdomen was performed, including heavily T2-weighted images of the biliary and pancreatic ducts.  Three-dimensional MR images were rendered by post processing of the original MR data.  Comparison:  CT abdomen/pelvis 10/13/2011  Findings:  Trace perinephric stranding and bilateral T2 hyperintense renal cortical cysts are again noted, largest left lower renal pole 3.5 cm.  These are stable allowing for differences in technique.  No hydronephrosis.  Common duct is mildly dilated measuring 1.1 cm at its midportion.  No pancreatic ductal dilatation.  T2  hyperintense too small to characterize 4 mm hyperintense focus left hepatic lobe lateral segment image 24 series 3 is most likely a cyst or possibly biliary hamartoma.  This may be too small to be previously visualized.  Spleen, adrenal glands, pancreas, and gallbladder are unremarkable.  No lymphadenopathy or ascites.  Disc degenerative changes are noted in the spine.  Some series are degraded by patient respiratory motion. Allowing for this, on heavily T2-weighted images, there is no focal filling defect within the common duct.  There is gradual tapering to the level of the ampulla.  The liver is markedly hypointense on T1 and T2-weighted imaging which may indicate iron deposition.  No lymphadenopathy.  IMPRESSION: Mild common duct dilatation to 1 cm without filling defect allowing for mild motion artifact.  Gradual tapering to the ampulla is noted and there is no intrahepatic ductal dilatation.  Hepatic hypointensity which may indicate iron deposition.   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  No secondary imaging findings to suggest cirrhosis.   Original Report Authenticated By: Harrel Lemon, M.D.    Dg Chest Port 1 View  12/24/2011  *RADIOLOGY  REPORT*  Clinical Data: Hypertension and anemia.  PORTABLE CHEST - 1 VIEW  Comparison: 12/08/2011  Findings: Left lower lobe airspace disease seen previously persists without substantial interval change.  Right lung remains clear. Interstitial markings are diffusely coarsened with chronic features. The cardiopericardial silhouette is enlarged.  IMPRESSION: No substantial change.  Cardiomegaly with persistent airspace opacity involving the left mid and lower lung.   Original Report Authenticated By: ERIC A. MANSELL, M.D.     Scheduled Meds:   . darbepoetin (ARANESP) injection - NON-DIALYSIS  150 mcg Subcutaneous Q Fri-1800  . feeding supplement  237 mL Oral BID BM  . furosemide      . furosemide  20 mg Intravenous Once  . isosorbide mononitrate  30 mg Oral QPM  . metoprolol  50 mg Oral QHS  . pantoprazole  40 mg Oral BID AC  . phytonadione  2 mg Subcutaneous Once  . sodium chloride  3 mL Intravenous Q12H  . Tamsulosin HCl  0.8 mg Oral QHS  . DISCONTD: Darbepoetin Alfa-Polysorbate  150 mg Injection Q7 days   Continuous Infusions:   ________________________________________________________________________  Time spent: 30 min    [facility]  [facility] Pager [phone] If 8PM-8AM, please contact night-coverage at [email or web address], password [facility] 12/25/2011, 4:53 PM  LOS: 1 day

## 2011-12-26 DIAGNOSIS — D649 Anemia, unspecified: Secondary | ICD-10-CM

## 2011-12-26 DIAGNOSIS — I059 Rheumatic mitral valve disease, unspecified: Secondary | ICD-10-CM

## 2011-12-26 DIAGNOSIS — N184 Chronic kidney disease, stage 4 (severe): Secondary | ICD-10-CM

## 2011-12-26 LAB — BASIC METABOLIC PANEL
BUN: 54 mg/dL — ABNORMAL HIGH (ref 6–23)
Chloride: 105 mEq/L (ref 96–112)
GFR calc Af Amer: 19 mL/min — ABNORMAL LOW (ref >90)
GFR calc non Af Amer: 16 mL/min — ABNORMAL LOW (ref >90)
Potassium: 4.6 mEq/L (ref 3.5–5.1)
Sodium: 134 mEq/L — ABNORMAL LOW (ref 135–145)

## 2011-12-26 LAB — CBC
HCT: 25.2 % — ABNORMAL LOW (ref 39.0–52.0)
Hemoglobin: 8.1 g/dL — ABNORMAL LOW (ref 13.0–17.0)
MCHC: 32.1 g/dL (ref 30.0–36.0)
RDW: 16.8 % — ABNORMAL HIGH (ref 11.5–15.5)
WBC: 4.1 10*3/uL (ref 4.0–10.5)

## 2011-12-26 LAB — PROTIME-INR
INR: 1.56 — ABNORMAL HIGH (ref 0.00–1.49)
Prothrombin Time: 18.2 seconds — ABNORMAL HIGH (ref 11.6–15.2)

## 2011-12-26 NOTE — Progress Notes (Signed)
  Echocardiogram 2D Echocardiogram has been performed.  Tracy [name] 12/26/2011, 11:21 AM

## 2011-12-26 NOTE — Progress Notes (Addendum)
[facility] PROGRESS NOTE  Tracy [name] YQM:[id number] DOB: April 03, 1919 DOA: 12/24/2011 PCP: [name], MD  Assessment/Plan: Principal Problem:  *Anemia  Suspect this is related to recent acute blood loss (which occurred recently), CKD and myelodysplasia. He left the hospital on 9/20 with a hemoglobin of 8.3 and was sent to be admitted on 9/27 with a Hgb of 6.6- He has received a total of 3 units of blood thus far during this hospital stay. He receives Aranesp injections as outpt and his last one was on the day of admission. Thus far Hgb is stable- suspect the acute drop this was related to his myelodysplasia (see discussion below in regard to Hematology eval)  Active Problems:   Warfarin-induced coagulopathy INR improved - given a small dose of Vit K yesterday- will cont to hold for now- he was due to see Dr Vernie Ammons in the office in the AM for a planned prostate surgery next week- he was asked by Dr Vernie Ammons to hold his Coumadin this week.    Epistaxis Due to coagulopathy- now resolved   Atrial fibrillation Currently rate controlled- cont current meds   Chronic diastolic heart failure He states he no longer takes Lasix- He was given Lasix in between the units of blood. Currently he is not having an acute problem.    Pancytopenia/ MDS Under care of Dr [name]- received Aranesp and "shots for his low white count" recently as outpatient. Platelets now noted to have dropped significantly - counts 2 wks ago were in 90 thousands. Have discussed with Dr Alcide Evener who is on call today and advised to transfuse platelets if count is less than 10, 000 or if he begins to bleed. Dr. [name] with be notified and will come to evaluate the patient in the AM- I have ordered a peripheral smear as well.     CKD (chronic kidney disease), stage IV Stable- he has undergone vein mapping and needs vascular access for future dialysis  Recent Upper GI bleed (9/9)/ hematemesis He underwent an EGD on  9/12 which revealed mild gastritis   Essential hypertension, benign Cont Lopressor   CAD/ NSTEMI  Cont Imdur and B blocker- not on ASA- agree that this is probably demand ischemia. Troponins improved.   Code Status: full code Disposition Plan: follow in SDU DVT prophylaxis: SCDs   Brief narrative: Patient is a 78 year old male with multiple medical problems including coronary disease, stage III CKD, chronic atrial fibrillation on Coumadin, currently on hold, history of hypertension, hyperlipidemia presented to ED with symptomatic anemia.patient had been recently discharged from the hospital on 12/10/2011 when he was admitted for upper GI bleed/hematemesis and hypercalcemia.  The patient had extensive workup for hypercalcemia and etiology seems to be unclear but resolved.  The patient had episode of nausea and hematemesis the during the previous admission and he underwent EGD which was essentially normal and his Coumadin was resumed. He also was found to have pneumonia during that admission.  Per patient, he had his INR checked on Wednesday prior to admission and was 8.9. He was told to hold Coumadin and eat "greens". Patient presented to short stay on 8/287 for an Aranesp injection and was found to have a hemoglobin of 6.6. Patient admitted to having shortness of breath with exertion, generalized weakness, fatigue and intermittent epistaxis for last 3 days prior to presentation.   HPI/Subjective: Pt resting in bed. No further bleeding from nose. No new bruising.   Objective: Filed Vitals:   12/26/11 1800 12/26/11 1929 12/26/11  2000 12/26/11 2025  BP:    144/88  Pulse: 103  97 95  Temp:  98 F (36.7 C)    TempSrc:  Oral    Resp:      Height:      Weight:      SpO2: 93%  93% 92%    Intake/Output Summary (Last 24 hours) at 12/26/11 2258 Last data filed at 12/26/11 7846  Gross per 24 hour  Intake      0 ml  Output    775 ml  Net   -775 ml    Exam:   General:  Alert, no acute  distress  Cardiovascular: RRR, no murmurs  Respiratory: CTA b/l   Abdomen: soft, NT, ND, BS+  Ext: no c/c/e  Data Reviewed: Basic Metabolic Panel:  Lab 12/26/11 9629 12/25/11 0032 12/24/11 1555 12/24/11 1430  NA 134* 134* 135 136  K 4.6 5.0 5.0 4.7  CL 105 106 106 106  CO2 16* 14* 15* 15*  GLUCOSE 101* 116* 107* 140*  BUN 54* 55* 55* 54*  CREATININE 3.14* 3.26* 3.28* 3.19*  CALCIUM 8.7 8.7 9.0 9.0  MG -- -- -- --  PHOS -- -- -- 3.5   Liver Function Tests:  Lab 12/24/11 1555 12/24/11 1430  AST 32 --  ALT 35 --  ALKPHOS 72 --  BILITOT 1.5* --  PROT 6.7 --  ALBUMIN 3.5 3.5   No results found for this basename: LIPASE:5,AMYLASE:5 in the last 168 hours No results found for this basename: AMMONIA:5 in the last 168 hours CBC:  Lab 12/26/11 0435 12/25/11 2018 12/25/11 0932 12/25/11 0031 12/24/11 1555  WBC 4.1 -- 4.4 4.3 4.3  NEUTROABS -- -- -- -- 2.2  HGB 8.1* 8.5* 7.6* 7.6* 6.6*  HCT 25.2* 26.6* 23.7* 23.9* 21.1*  MCV 90.6 -- 91.2 90.9 93.8  PLT 18* -- 28* 24* 32*   Cardiac Enzymes:  Lab 12/25/11 2018 12/25/11 0931 12/25/11 0031  CKTOTAL -- 97 95  CKMB -- 3.7 3.6  CKMBINDEX -- -- --  TROPONINI 0.96* 1.21* 1.31*   BNP (last 3 results)  Basename 12/08/11 1600 08/04/11 1241 02/16/11 1044  PROBNP 9654.0* 601.0* 3945.0*   CBG: No results found for this basename: GLUCAP:5 in the last 168 hours  Recent Results (from the past 240 hour(s))  MRSA PCR SCREENING     Status: Normal   Collection Time   12/24/11  7:24 PM      Component Value Range Status Comment   MRSA by PCR NEGATIVE  NEGATIVE Final      Studies: Dg Chest 2 View  12/08/2011  *RADIOLOGY REPORT*  Clinical Data: Pneumonia.  Short of breath.  Previous myocardial infarct.  Atrial fibrillation.  CHEST - 2 VIEW  Comparison: 12/04/2011  Findings: Changes of COPD are again seen.  Mild cardiomegaly stable.  Left lower lobe airspace disease shows no significant change, consistent with pneumonia.  Right lung is  clear.  No evidence of pleural effusion.  IMPRESSION:  1. No significant change in left lower lobe airspace disease, consistent with pneumonia. 2.  Stable mild cardiomegaly and COPD.   Original Report Authenticated By: Danae Orleans, M.D.    Dg Chest 2 View  12/04/2011  *RADIOLOGY REPORT*  Clinical Data: COPD.  CHEST - 2 VIEW  Comparison: 08/04/2011  Findings: New airspace disease is seen in the left lower lobe, consistent with pneumonia.  Changes of COPD again demonstrated. Mild cardiomegaly is stable.  No evidence of congestive heart failure  or pleural effusion.  IMPRESSION:  1.  New left lower lobe airspace disease, consistent with pneumonia. 2.  COPD. 3.  Stable mild cardiomegaly.   Original Report Authenticated By: Danae Orleans, M.D.    Mr Mrcp  12/06/2011  *RADIOLOGY REPORT*  Clinical Data:  Weight loss, dilated common duct and pancreatic duct on prior exam and possible common duct stone.  Anorexia.  MRI ABDOMEN WITHOUT CONTRAST (MRCP)  Technique: Multiplanar multisequence MR imaging of the abdomen was performed, including heavily T2-weighted images of the biliary and pancreatic ducts.  Three-dimensional MR images were rendered by post processing of the original MR data.  Comparison:  CT abdomen/pelvis 10/13/2011  Findings:  Trace perinephric stranding and bilateral T2 hyperintense renal cortical cysts are again noted, largest left lower renal pole 3.5 cm.  These are stable allowing for differences in technique.  No hydronephrosis.  Common duct is mildly dilated measuring 1.1 cm at its midportion.  No pancreatic ductal dilatation.  T2 hyperintense too small to characterize 4 mm hyperintense focus left hepatic lobe lateral segment image 24 series 3 is most likely a cyst or possibly biliary hamartoma.  This may be too small to be previously visualized.  Spleen, adrenal glands, pancreas, and gallbladder are unremarkable.  No lymphadenopathy or ascites.  Disc degenerative changes are noted in the spine.  Some  series are degraded by patient respiratory motion. Allowing for this, on heavily T2-weighted images, there is no focal filling defect within the common duct.  There is gradual tapering to the level of the ampulla.  The liver is markedly hypointense on T1 and T2-weighted imaging which may indicate iron deposition.  No lymphadenopathy.  IMPRESSION: Mild common duct dilatation to 1 cm without filling defect allowing for mild motion artifact.  Gradual tapering to the ampulla is noted and there is no intrahepatic ductal dilatation.  Hepatic hypointensity which may indicate iron deposition.   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  No secondary imaging findings to suggest cirrhosis.   Original Report Authenticated By: Harrel Lemon, M.D.    Dg Chest Port 1 View  12/24/2011  *RADIOLOGY REPORT*  Clinical Data: Hypertension and anemia.  PORTABLE CHEST - 1 VIEW  Comparison: 12/08/2011  Findings: Left lower lobe airspace disease seen previously persists without substantial interval change.  Right lung remains clear. Interstitial markings are diffusely coarsened with chronic features. The cardiopericardial silhouette is enlarged.  IMPRESSION: No substantial change.  Cardiomegaly with persistent airspace opacity involving the left mid and lower lung.   Original Report Authenticated By: ERIC A. MANSELL, M.D.     Scheduled Meds:    . darbepoetin (ARANESP) injection - NON-DIALYSIS  150 mcg Subcutaneous Q Fri-1800  . feeding supplement  237 mL Oral BID BM  . isosorbide mononitrate  30 mg Oral QPM  . metoprolol  50 mg Oral QHS  . pantoprazole  40 mg Oral BID AC  . sodium chloride  3 mL Intravenous Q12H  . Tamsulosin HCl  0.8 mg Oral QHS   Continuous Infusions:   ________________________________________________________________________  Time spent: 30 min    [facility]  [facility] Pager [phone] If 8PM-8AM, please contact night-coverage at  [email or web address], password [facility] 12/26/2011, 10:58 PM  LOS: 2 days

## 2011-12-27 ENCOUNTER — Encounter: Payer: Medicare Other | Admitting: Physician Assistant

## 2011-12-27 LAB — BASIC METABOLIC PANEL
Chloride: 107 mEq/L (ref 96–112)
GFR calc Af Amer: 21 mL/min — ABNORMAL LOW (ref >90)
GFR calc non Af Amer: 18 mL/min — ABNORMAL LOW (ref >90)
Potassium: 4.7 mEq/L (ref 3.5–5.1)
Sodium: 133 mEq/L — ABNORMAL LOW (ref 135–145)

## 2011-12-27 LAB — PATHOLOGIST SMEAR REVIEW

## 2011-12-27 LAB — CBC
HCT: 24.9 % — ABNORMAL LOW (ref 39.0–52.0)
MCHC: 31.7 g/dL (ref 30.0–36.0)
Platelets: 23 10*3/uL — CL (ref 150–400)
RDW: 17.7 % — ABNORMAL HIGH (ref 11.5–15.5)
WBC: 3.8 10*3/uL — ABNORMAL LOW (ref 4.0–10.5)

## 2011-12-27 LAB — PREPARE RBC (CROSSMATCH)

## 2011-12-27 MED ORDER — DESMOPRESSIN ACETATE SPRAY 0.01 % NA SOLN
20.0000 ug | Freq: Once | NASAL | Status: AC
  Administered 2011-12-27: 20 ug via NASAL
  Filled 2011-12-27: qty 5
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
## 2011-12-28 ENCOUNTER — Encounter (HOSPITAL_COMMUNITY): Payer: Medicare Other

## 2011-12-28 ENCOUNTER — Inpatient Hospital Stay (HOSPITAL_COMMUNITY): Payer: Medicare Other

## 2011-12-28 DIAGNOSIS — N5089 Other specified disorders of the male genital organs: Secondary | ICD-10-CM

## 2011-12-28 DIAGNOSIS — D61818 Other pancytopenia: Secondary | ICD-10-CM

## 2011-12-28 LAB — TYPE AND SCREEN
ABO/RH(D): O NEG
Unit division: 0
Unit division: 0

## 2011-12-28 LAB — URINE MICROSCOPIC-ADD ON

## 2011-12-28 LAB — URINALYSIS, ROUTINE W REFLEX MICROSCOPIC
Glucose, UA: NEGATIVE mg/dL
Ketones, ur: NEGATIVE mg/dL
Protein, ur: 100 mg/dL — AB

## 2011-12-28 LAB — CBC
MCH: 28.5 pg (ref 26.0–34.0)
MCHC: 31.6 g/dL (ref 30.0–36.0)
MCV: 90.1 fL (ref 78.0–100.0)
Platelets: 17 10*3/uL — CL (ref 150–400)
RBC: 2.84 MIL/uL — ABNORMAL LOW (ref 4.22–5.81)

## 2011-12-28 LAB — PREPARE FRESH FROZEN PLASMA: Unit division: 0

## 2011-12-28 LAB — RENAL FUNCTION PANEL
CO2: 16 mEq/L — ABNORMAL LOW (ref 19–32)
Calcium: 9.2 mg/dL (ref 8.4–10.5)
Creatinine, Ser: 2.54 mg/dL — ABNORMAL HIGH (ref 0.50–1.35)
GFR calc non Af Amer: 21 mL/min — ABNORMAL LOW (ref >90)
Glucose, Bld: 108 mg/dL — ABNORMAL HIGH (ref 70–99)
Phosphorus: 3.5 mg/dL (ref 2.3–4.6)
Sodium: 137 mEq/L (ref 135–145)

## 2011-12-28 LAB — PROTIME-INR: Prothrombin Time: 16.4 seconds — ABNORMAL HIGH (ref 11.6–15.2)
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
## 2011-12-28 NOTE — Progress Notes (Signed)
Spoke with Dr Ivonne Andrew- OK to transfuse platelets- f/u counts in AM.    Calvert Cantor, MD [phone]

## 2011-12-28 NOTE — Progress Notes (Signed)
DIAGNOSIS: Myelodysplastic syndrome with translocation (1;3).   PRIOR THERAPY: None   CURRENT THERAPY: Weekly Aranesp under the care of Dr. Caryn Section  Subjective: Tracy [name] is seen and examined today. The patient is doing fine except for continuous mild epistaxis from the left nostril. He also has some ecchymosis in his upper extremities. He denied having any significant rectal bleeding, hemoptysis or hematemesis. He was admitted with symptomatic anemia and received packed rbc's transfusion during this hospitalization. His hemoglobin is currently stable and platelets count are low at 17,000. He denied having any significant fever or chills. He has no chest pain or shortness of breath. He was treated recently for hypercalcemia of unknown etiology. Of note that the patient had CT scan of the chest in May of 2013 that showed questionable lung nodule suspicious for lung atelectasis but malignancy could not be excluded and followup CT scan of the chest was recommended. Chest x-ray on 12/04/2011 showed new left lower lobe air space disease consistent with pneumonia but no lung masses. He also has CT of the abdomen and pelvis in July of 2013 that were unremarkable.  Objective: Vital signs in last 24 hours: Temp:  [97 F (36.1 C)-98.4 F (36.9 C)] 97.5 F (36.4 C) (10/01 0730) Pulse Rate:  [87-108] 90  (10/01 0306) Resp:  [16-20] 20  (10/01 0730) BP: (114-176)/(58-118) 153/77 mmHg (10/01 0730) SpO2:  [88 %-99 %] 90 % (10/01 0600)  Intake/Output from previous day: 09/30 0701 - 10/01 0700 In: 1206.3 [P.O.:480; I.V.:3; Blood:723.3] Out: 1600 [Urine:1600] Intake/Output this shift:    General appearance: alert, cooperative and no distress Resp: clear to auscultation bilaterally Cardio: regular rate and rhythm, S1, S2 normal, no murmur, click, rub or gallop GI: soft, non-tender; bowel sounds normal; no masses,  no organomegaly Extremities: extremities normal, atraumatic, no cyanosis or edema  Lab  Results:   Basename 12/28/11 0500 12/27/11 0445  WBC 3.5* 3.8*  HGB 8.1* 7.9*  HCT 25.6* 24.9*  PLT 17* 23*   BMET  Basename 12/28/11 0500 12/27/11 0445  NA 137 133*  K 4.8 4.7  CL 108 107  CO2 16* 15*  GLUCOSE 108* 120*  BUN 50* 50*  CREATININE 2.54* 2.84*  CALCIUM 9.2 8.8    Studies/Results: No results found.  Medications: I have reviewed the patient's current medications.  Assessment/Plan: This is a very pleasant 78 years old white male with history of myelodysplastic syndrome with significant pancytopenia. I have a lengthy discussion with the patient today about his condition, prognosis and treatment options. I gave him the option of treatment with Vidaza versus continuous palliative care and supportive treatment. The patient has no interest on treatment with chemotherapy and he preferred to continue supportive care at this point. I would recommend transfusing the patient with 1 units of platelets today to help stop the epistaxis and if no improvement he may need consultation with ENT. For the large scrotal swelling, I would recommend evaluation by urology and ultrasound of the scrotum. Thank you for taking good care of Tracy [name]. I will continue to follow the patient with you and assist in his management on as needed basis.  LOS: 4 days    Tracy [name] K. 12/28/2011

## 2011-12-28 NOTE — Progress Notes (Signed)
[facility] PROGRESS NOTE  Tracy [name] ZOX:[id number] DOB: 1919/10/02 DOA: 12/24/2011 PCP: [name], MD  Assessment/Plan: Principal Problem:  *Anemia  Suspect this is related to recent acute blood loss (which occurred recently), CKD and myelodysplasia. He left the hospital on 9/20 with a hemoglobin of 8.3 and was sent to be admitted on 9/27 with a Hgb of 6.6- He has received a total of 3 units of blood thus far during this hospital stay. He receives Aranesp injections as outpt and his last one was on the day of admission. Thus far Hgb is stable- suspect the acute drop this was related underlying CKD and myelodysplasia complicated by ongoing low grade epistaxis, and urethral bleeding.    Active Problems:   Warfarin-induced coagulopathy INR improved - given a small dose of Vit K but epistaxis continued therefore, FFP was given yesterday and INR was reversed appropriately- Will be continuing to hold Coumadin for further urological studies (and also because he is severely thrombocytopenic)   Epistaxis Due to coagulopathy and thrombocytopenia- awaiting hematology decision in regards to transfusion of platelets. If bleeding continues despite platelet transfusion, will need to call ENT for packing or cauterization.   Urethral bleed Pt notified me of this today-  has continued to ooze blood from urethra for 3 wks after I and O cath- I have consulted urology in regards to this.   Right Testicular enlargement  urology eval appreciated- suspected to be a hydrocele- work-up underway with ultrasound- advised to elevate.    Atrial fibrillation Currently rate controlled- cont current meds- no anticoagulation for now   Chronic diastolic heart failure He states he no longer takes Lasix- He was given Lasix in between the units of blood. Currently he is not having an acute problem.    Pancytopenia/ MDS Under care of Dr [name]- received Aranesp and "shots for his low white count" recently  as outpatient. Platelets now noted to have dropped significantly - counts 2 wks ago were in 90 thousands. Have discussed with Dr Alcide Evener who is on call today and advised to transfuse platelets if count is less than 10, 000 - awaiting Dr Asa Lente decision in regards to transfusion of platelets.    CKD (chronic kidney disease), stage IV Stable- he has undergone vein mapping and needs vascular access for future dialysis  Recent Upper GI bleed (9/9)/ hematemesis He underwent an EGD on 9/12 which revealed mild gastritis   Essential hypertension, benign Cont Lopressor   CAD/ NSTEMI  Cont Imdur and B blocker- not on ASA- agree that this is probably demand ischemia. Troponins improved.   Code Status: full code Disposition Plan: follow in SDU until platelets noted to improve of bleeding noted to stop.  DVT prophylaxis: SCDs   Brief narrative: Patient is a 78 year old male with multiple medical problems including coronary disease, stage III CKD, chronic atrial fibrillation on Coumadin, currently on hold, history of hypertension, hyperlipidemia presented to ED with symptomatic anemia.patient had been recently discharged from the hospital on 12/10/2011 when he was admitted for upper GI bleed/hematemesis and hypercalcemia.  The patient had extensive workup for hypercalcemia and etiology seems to be unclear but resolved.  The patient had episode of nausea and hematemesis the during the previous admission and he underwent EGD which was essentially normal and his Coumadin was resumed. He also was found to have pneumonia during that admission.  Per patient, he had his INR checked on Wednesday prior to admission and was 8.9. He was told to hold Coumadin and eat "  greens". Patient presented to short stay on 8/287 for an Aranesp injection and was found to have a hemoglobin of 6.6. Patient admitted to having shortness of breath with exertion, generalized weakness, fatigue and intermittent epistaxis for last 3 days  prior to presentation.   HPI/Subjective: Pt resting in bed. He tells me today that he is still oozing blood from his nose and then admits to urethral bleed for the past 3 weeks.  He states he had I and O caths during previous hospital stay (3 wks ago) and bleeding began at that time and never truly stopped. I addition, he admits to his right testicle (only has one testicle) being enlarged - at times it is quite severe but today is more moderate. No c/o pain in penis or testicle. Has been urinated appropriately.   Objective: Filed Vitals:   12/28/11 0600 12/28/11 0730 12/28/11 1145 12/28/11 1535  BP:  153/77 122/83 159/91  Pulse:   87 106  Temp:  97.5 F (36.4 C) 97.5 F (36.4 C) 97.8 F (36.6 C)  TempSrc:  Oral Oral Oral  Resp:  20 18 18   Height:      Weight:      SpO2: 90%  94% 93%    Intake/Output Summary (Last 24 hours) at 12/28/11 1606 Last data filed at 12/28/11 0700  Gross per 24 hour  Intake 616.33 ml  Output   1100 ml  Net -483.67 ml    Exam:   General:  Alert, no acute distress  Cardiovascular: RRR, no murmurs  Respiratory: CTA b/l   Abdomen: soft, NT, ND, BS+  Ext: no c/c/e  GU- currently no blood at urethral meatus, right testicle is enlarged, mildly tender but non-compressible.   Data Reviewed: Basic Metabolic Panel:  Lab 12/28/11 4540 12/27/11 0445 12/26/11 0435 12/25/11 0032 12/24/11 1555 12/24/11 1430  NA 137 133* 134* 134* 135 --  K 4.8 4.7 4.6 5.0 5.0 --  CL 108 107 105 106 106 --  CO2 16* 15* 16* 14* 15* --  GLUCOSE 108* 120* 101* 116* 107* --  BUN 50* 50* 54* 55* 55* --  CREATININE 2.54* 2.84* 3.14* 3.26* 3.28* --  CALCIUM 9.2 8.8 8.7 8.7 9.0 --  MG -- -- -- -- -- --  PHOS 3.5 -- -- -- -- 3.5   Liver Function Tests:  Lab 12/28/11 0500 12/24/11 1555 12/24/11 1430  AST -- 32 --  ALT -- 35 --  ALKPHOS -- 72 --  BILITOT -- 1.5* --  PROT -- 6.7 --  ALBUMIN 3.3* 3.5 3.5   No results found for this basename: LIPASE:5,AMYLASE:5 in the  last 168 hours No results found for this basename: AMMONIA:5 in the last 168 hours CBC:  Lab 12/28/11 0500 12/27/11 0445 12/26/11 0435 12/25/11 2018 12/25/11 0932 12/25/11 0031 12/24/11 1555  WBC 3.5* 3.8* 4.1 -- 4.4 4.3 --  NEUTROABS -- -- -- -- -- -- 2.2  HGB 8.1* 7.9* 8.1* 8.5* 7.6* -- --  HCT 25.6* 24.9* 25.2* 26.6* 23.7* -- --  MCV 90.1 91.5 90.6 -- 91.2 90.9 --  PLT 17* 23* 18* -- 28* 24* --   Cardiac Enzymes:  Lab 12/25/11 2018 12/25/11 0931 12/25/11 0031  CKTOTAL -- 97 95  CKMB -- 3.7 3.6  CKMBINDEX -- -- --  TROPONINI 0.96* 1.21* 1.31*   BNP (last 3 results)  Basename 12/08/11 1600 08/04/11 1241 02/16/11 1044  PROBNP 9654.0* 601.0* 3945.0*   CBG: No results found for this basename: GLUCAP:5 in the last  168 hours  Recent Results (from the past 240 hour(s))  MRSA PCR SCREENING     Status: Normal   Collection Time   12/24/11  7:24 PM      Component Value Range Status Comment   MRSA by PCR NEGATIVE  NEGATIVE Final      Studies: Dg Chest 2 View  12/08/2011  *RADIOLOGY REPORT*  Clinical Data: Pneumonia.  Short of breath.  Previous myocardial infarct.  Atrial fibrillation.  CHEST - 2 VIEW  Comparison: 12/04/2011  Findings: Changes of COPD are again seen.  Mild cardiomegaly stable.  Left lower lobe airspace disease shows no significant change, consistent with pneumonia.  Right lung is clear.  No evidence of pleural effusion.  IMPRESSION:  1. No significant change in left lower lobe airspace disease, consistent with pneumonia. 2.  Stable mild cardiomegaly and COPD.   Original Report Authenticated By: Danae Orleans, M.D.    Dg Chest 2 View  12/04/2011  *RADIOLOGY REPORT*  Clinical Data: COPD.  CHEST - 2 VIEW  Comparison: 08/04/2011  Findings: New airspace disease is seen in the left lower lobe, consistent with pneumonia.  Changes of COPD again demonstrated. Mild cardiomegaly is stable.  No evidence of congestive heart failure or pleural effusion.  IMPRESSION:  1.  New left lower  lobe airspace disease, consistent with pneumonia. 2.  COPD. 3.  Stable mild cardiomegaly.   Original Report Authenticated By: Danae Orleans, M.D.    Mr Mrcp  12/06/2011  *RADIOLOGY REPORT*  Clinical Data:  Weight loss, dilated common duct and pancreatic duct on prior exam and possible common duct stone.  Anorexia.  MRI ABDOMEN WITHOUT CONTRAST (MRCP)  Technique: Multiplanar multisequence MR imaging of the abdomen was performed, including heavily T2-weighted images of the biliary and pancreatic ducts.  Three-dimensional MR images were rendered by post processing of the original MR data.  Comparison:  CT abdomen/pelvis 10/13/2011  Findings:  Trace perinephric stranding and bilateral T2 hyperintense renal cortical cysts are again noted, largest left lower renal pole 3.5 cm.  These are stable allowing for differences in technique.  No hydronephrosis.  Common duct is mildly dilated measuring 1.1 cm at its midportion.  No pancreatic ductal dilatation.  T2 hyperintense too small to characterize 4 mm hyperintense focus left hepatic lobe lateral segment image 24 series 3 is most likely a cyst or possibly biliary hamartoma.  This may be too small to be previously visualized.  Spleen, adrenal glands, pancreas, and gallbladder are unremarkable.  No lymphadenopathy or ascites.  Disc degenerative changes are noted in the spine.  Some series are degraded by patient respiratory motion. Allowing for this, on heavily T2-weighted images, there is no focal filling defect within the common duct.  There is gradual tapering to the level of the ampulla.  The liver is markedly hypointense on T1 and T2-weighted imaging which may indicate iron deposition.  No lymphadenopathy.  IMPRESSION: Mild common duct dilatation to 1 cm without filling defect allowing for mild motion artifact.  Gradual tapering to the ampulla is noted and there is no intrahepatic ductal dilatation.  Hepatic hypointensity which may indicate iron deposition.   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  No secondary imaging findings to suggest cirrhosis.   Original Report Authenticated By: Harrel Lemon, M.D.    Dg Chest Port 1 View  12/24/2011  *RADIOLOGY REPORT*  Clinical Data: Hypertension and anemia.  PORTABLE CHEST - 1 VIEW  Comparison: 12/08/2011  Findings: Left  lower lobe airspace disease seen previously persists without substantial interval change.  Right lung remains clear. Interstitial markings are diffusely coarsened with chronic features. The cardiopericardial silhouette is enlarged.  IMPRESSION: No substantial change.  Cardiomegaly with persistent airspace opacity involving the left mid and lower lung.   Original Report Authenticated By: ERIC A. MANSELL, M.D.     Scheduled Meds:    . darbepoetin (ARANESP) injection - NON-DIALYSIS  150 mcg Subcutaneous Q Fri-1800  . desmopressin  20 mcg Nasal Once  . feeding supplement  237 mL Oral BID BM  . isosorbide mononitrate  30 mg Oral QPM  . metoprolol  50 mg Oral QHS  . pantoprazole  40 mg Oral BID AC  . sodium chloride  3 mL Intravenous Q12H  . Tamsulosin HCl  0.8 mg Oral QHS   Continuous Infusions:   ________________________________________________________________________  Time spent: 30 min    [facility] [place]  [facility] Pager [phone] If 8PM-8AM, please contact night-coverage at [email or web address], password [facility] 12/28/2011, 4:06 PM  LOS: 4 days

## 2011-12-29 ENCOUNTER — Ambulatory Visit: Payer: Medicare Other | Admitting: Internal Medicine

## 2011-12-29 ENCOUNTER — Other Ambulatory Visit: Payer: Medicare Other | Admitting: Lab

## 2011-12-29 DIAGNOSIS — D469 Myelodysplastic syndrome, unspecified: Secondary | ICD-10-CM

## 2011-12-29 LAB — CBC
Hemoglobin: 8.7 g/dL — ABNORMAL LOW (ref 13.0–17.0)
RBC: 2.98 MIL/uL — ABNORMAL LOW (ref 4.22–5.81)
WBC: 3.2 10*3/uL — ABNORMAL LOW (ref 4.0–10.5)

## 2011-12-29 LAB — PREPARE PLATELET PHERESIS

## 2011-12-29 NOTE — Progress Notes (Signed)
[facility] Progress Note [place] TEAM 1 - Stepdown/ICU TEAM   Tracy [name] AVW:[id number] DOB: 1919-04-10 DOA: 12/24/2011 PCP: [name], MD  Brief narrative: 78 year old male with multiple medical problems including coronary disease, stage III CKD, chronic atrial fibrillation on Coumadin (currently on hold), history of hypertension, hyperlipidemia presented to ED with symptomatic anemia.  Patient had been recently discharged from the hospital on 12/10/2011 when he was admitted for upper GI bleed/hematemesis and hypercalcemia. The patient had extensive workup for hypercalcemia and etiology seems to be unclear.  The patient had episode of nausea and hematemesis during the previous admission and he underwent EGD which was essentially normal and his Coumadin was resumed. He also was found to have pneumonia during that admission.  Per patient, he had his INR checked on Wednesday prior to admission and it was 8.9. He was told to hold Coumadin and eat "greens". Patient presented to short stay on 8/28 for an Aranesp injection and was found to have a hemoglobin of 6.6. Patient admitted to having shortness of breath with exertion, generalized weakness, fatigue and intermittent epistaxis for last 3 days prior to presentation.   Assessment/Plan:  Acute blood loss Anemia + CKD associated anemia + myelodysplasia left the hospital on 9/20 with a hemoglobin of 8.3 and on 9/27 Hgb 6.6 - received a total of 4 units of blood thus far during this hospital stay - receives Aranesp injections as outpt and his last one was on the day of admission - Hgb is holding steady/improving at this time - suspect the acute drop was related to his myelodysplasia (see discussion below in regard to Hematology eval) complicated by ongoing low grade epistaxis, low grad hematuria, as well as baseline anemia of renal disease - given coronary hx, will transfuse as needed to keep Hgb >8.0  Warfarin-induced coagulopathy  INR  normalized with Vitamin K and FFP - the patient has himself decided that he does not wish to resume coumadin dosing at any point   Spontaneous Epistaxis  Due to coagulopathy and thrombocytopenia - persisted despite FFP, dDAVP and vitamin K - was transfused a unit of plts  - If bleeding continues will need to call ENT for packing or cauterization - pt states that he has not noticed any significant nasal bleeding today  Urethral bleed  Pt has continued to ooze blood from urethra for 3 wks after I and O cath - see recommendations per urology  Coag negative staph urine culture It is possible that this is contributing to the patient's urethral bleed - he does report undergoing multiple and out caths during a recent hospital stay - I will treat the patient with a seven-day course of antibiotics  Right Testicular enlargement  Ultrasound confirmed this to be a hydrocele - see recommendations as per urology  Atrial fibrillation  Currently rate controlled - cont current meds - pt does not wish be on coumadin/blood thinner of any kind from this point forward   Chronic diastolic heart failure  He states he no longer takes Lasix - was given Lasix in between units of blood - well compensated at present   Pancytopenia / MDS  Under care of Dr [name] - received Aranesp and "shots for his low white count" recently as outpatient - Platelets have dropped significantly during his hospital stay - is now s/p transfusion of a unit of platelets w/ good response - watch plt count and bleeding (at this time appears to have impacted the epistaxis at least)  CKD (  chronic kidney disease), stage IV  Baseline crt appears to be approx 3 - stable - he has undergone vein mapping and eventaully needs vascular access for future dialysis   Non-gap metabolic acidosis Due to CKD - appears to be stable at this time  Recent Upper GI bleed (9/9)/ hematemesis while on coumadin He underwent an EGD on 9/12 which revealed mild  gastritis - no evidence of signif GI blood loss at this time   Essential hypertension, benign  Cont Lopressor - well controlled at present  CAD/ NSTEMI  Cont Imdur and B blocker - not on ASA due to bleeding and low plts - agree that this is probably demand ischemia - Troponins improved - no chest pain - keep Hgb >/= 8.0  Code Status: Full Disposition Plan: stable for transfer to medical bed, but will require 48-72hrs minimum of continued inpatient stay to assure is medically stable - PT/OT to eval  Consultants: Heme Urology  Antibiotics: none  DVT prophylaxis: SCDs in setting of bleeding  HPI/Subjective: The patient reports that he has not noted any significant epistaxis since last night.  He does report feeling markedly dyspneic after returning from the bathroom.  He does continue to have a modest amount of urethral blood.  He denies shortness of breath or chest pain at rest.  He denies nausea or vomiting   Objective: Blood pressure 143/101, pulse 88, temperature 98 F (36.7 C), temperature source Oral, resp. rate 16, height 6' (1.829 m), weight 82.6 kg (182 lb 1.6 oz), SpO2 92.00%.  Intake/Output Summary (Last 24 hours) at 12/29/11 1342 Last data filed at 12/29/11 1300  Gross per 24 hour  Intake  800.5 ml  Output   1025 ml  Net -224.5 ml     Exam: General: No acute respiratory distress at rest but easily winded with physical exam Lungs: Clear to auscultation bilaterally without wheezes or crackles Cardiovascular: Regular rate without appreciable murmur gallop or rub Abdomen: Nontender, nondistended, soft, bowel sounds positive, no rebound, no ascites, no appreciable mass Extremities: No significant cyanosis, clubbing, or edema bilateral lower extremities  Data Reviewed: Basic Metabolic Panel:  Lab 12/28/11 1610 12/27/11 0445 12/26/11 0435 12/25/11 0032 12/24/11 1555 12/24/11 1430  NA 137 133* 134* 134* 135 --  K 4.8 4.7 4.6 5.0 5.0 --  CL 108 107 105 106 106 --    CO2 16* 15* 16* 14* 15* --  GLUCOSE 108* 120* 101* 116* 107* --  BUN 50* 50* 54* 55* 55* --  CREATININE 2.54* 2.84* 3.14* 3.26* 3.28* --  CALCIUM 9.2 8.8 8.7 8.7 9.0 --  MG -- -- -- -- -- --  PHOS 3.5 -- -- -- -- 3.5   Liver Function Tests:  Lab 12/28/11 0500 12/24/11 1555 12/24/11 1430  AST -- 32 --  ALT -- 35 --  ALKPHOS -- 72 --  BILITOT -- 1.5* --  PROT -- 6.7 --  ALBUMIN 3.3* 3.5 3.5   CBC:  Lab 12/29/11 0518 12/28/11 0500 12/27/11 0445 12/26/11 0435 12/25/11 2018 12/25/11 0932 12/24/11 1555  WBC 3.2* 3.5* 3.8* 4.1 -- 4.4 --  NEUTROABS -- -- -- -- -- -- 2.2  HGB 8.7* 8.1* 7.9* 8.1* 8.5* -- --  HCT 27.6* 25.6* 24.9* 25.2* 26.6* -- --  MCV 92.6 90.1 91.5 90.6 -- 91.2 --  PLT 31* 17* 23* 18* -- 28* --   Cardiac Enzymes:  Lab 12/25/11 2018 12/25/11 0931 12/25/11 0031  CKTOTAL -- 97 95  CKMB -- 3.7 3.6  CKMBINDEX -- -- --  TROPONINI 0.96* 1.21* 1.31*   BNP (last 3 results)  Basename 12/08/11 1600 08/04/11 1241 02/16/11 1044  PROBNP 9654.0* 601.0* 3945.0*    Recent Results (from the past 240 hour(s))  MRSA PCR SCREENING     Status: Normal   Collection Time   12/24/11  7:24 PM      Component Value Range Status Comment   MRSA by PCR NEGATIVE  NEGATIVE Final   URINE CULTURE     Status: Normal   Collection Time   12/25/11  3:16 AM      Component Value Range Status Comment   Specimen Description URINE, RANDOM   Final    Special Requests NONE   Final    Culture  Setup Time 12/25/2011 11:38   Final    Colony Count >=100,000 COLONIES/ML   Final    Culture     Final    Value: STAPHYLOCOCCUS SPECIES (COAGULASE NEGATIVE)     Note: RIFAMPIN AND GENTAMICIN SHOULD NOT BE USED AS SINGLE DRUGS FOR TREATMENT OF STAPH INFECTIONS.   Report Status 12/29/2011 FINAL   Final    Organism ID, Bacteria STAPHYLOCOCCUS SPECIES (COAGULASE NEGATIVE)   Final     Studies:  Recent x-ray studies have been reviewed in detail by the Attending Physician  Scheduled Meds:  Reviewed in  detail by the Attending Physician   Lonia Blood, MD [facility] Office  [phone] Pager [phone]  On-Call/Text Page:      [email or web address]      password [email or web address]  If 7PM-7AM, please contact night-coverage [email or web address] Password [email or web address] 12/29/2011, 1:42 PM   LOS: 5 days

## 2011-12-29 NOTE — Progress Notes (Signed)
Pt transferred to room 6737 via wheelchair. Pt denies any pain or other complaints at this time. Transferred with dental partial, personal bag with clothes, hangers, cane, phone, phone charger, and candy. Report given to Doree Fudge, Charity fundraiser. Wife notified per patient.   Dawson Bills, RN

## 2011-12-30 ENCOUNTER — Encounter (HOSPITAL_COMMUNITY): Payer: Medicare Other

## 2011-12-30 ENCOUNTER — Inpatient Hospital Stay (HOSPITAL_COMMUNITY): Admission: RE | Admit: 2011-12-30 | Payer: Medicare Other | Source: Ambulatory Visit

## 2011-12-30 DIAGNOSIS — I10 Essential (primary) hypertension: Secondary | ICD-10-CM

## 2011-12-30 LAB — POTASSIUM: Potassium: 5.4 mEq/L — ABNORMAL HIGH (ref 3.5–5.1)

## 2011-12-30 LAB — BASIC METABOLIC PANEL
Calcium: 8.6 mg/dL (ref 8.4–10.5)
GFR calc Af Amer: 24 mL/min — ABNORMAL LOW (ref >90)
GFR calc non Af Amer: 21 mL/min — ABNORMAL LOW (ref >90)
Potassium: 5.2 mEq/L — ABNORMAL HIGH (ref 3.5–5.1)
Sodium: 136 mEq/L (ref 135–145)

## 2011-12-30 LAB — URINE CULTURE

## 2011-12-30 LAB — CBC
MCHC: 31.9 g/dL (ref 30.0–36.0)
RDW: 19.2 % — ABNORMAL HIGH (ref 11.5–15.5)

## 2011-12-30 MED ORDER — DEXTROSE 5 % IV SOLN
1.0000 g | INTRAVENOUS | Status: DC
  Administered 2011-12-30 – 2012-01-01 (×3): 1 g via INTRAVENOUS
  Filled 2011-12-30 (×4): qty 10

## 2011-12-30 NOTE — Evaluation (Signed)
Occupational Therapy Evaluation Patient Details Name: Tracy [name] MRN: [id number] DOB: March 30, 1919 Today's Date: 12/30/2011 Time: 9629-5284 OT Time Calculation (min): 28 min  OT Assessment / Plan / Recommendation Clinical Impression  Pt admitted for acute blood loss anemia. Patient had been recently discharged from the hospital on 12/10/2011 when he was admitted for upper GI bleed/hematemesis and hypercalcemia.  Educated pt on energy conservation techniques. No further acute OT needs. Will sign off at this time. Please re-order if pt experiences functional decline.    OT Assessment  Patient does not need any further OT services    Follow Up Recommendations  No OT follow up    Barriers to Discharge      Equipment Recommendations  None recommended by OT    Recommendations for Other Services    Frequency       Precautions / Restrictions Precautions Precautions: None;Other (comment) (watch O2 sats) Restrictions Weight Bearing Restrictions: No   Pertinent Vitals/Pain Pt O2 readings on RA: 91% at rest prior to activity;  Ranging 85-90% with activity;  92% at rest post activity. HR stable throughout session.    ADL  Eating/Feeding: Performed;Independent Where Assessed - Eating/Feeding: Edge of bed Upper Body Dressing: Performed;Independent Where Assessed - Upper Body Dressing: Unsupported sitting Lower Body Dressing: Performed;Independent Where Assessed - Lower Body Dressing: Unsupported sitting Toilet Transfer: Simulated;Supervision/safety Toilet Transfer Method: Sit to stand (ambulating) Acupuncturist:  (bed) Equipment Used: Gait belt Transfers/Ambulation Related to ADLs: Supervision during ambulation due to O2 sats ADL Comments: Pt is at baseline with ADLs.  O2 ranging from 85-93% on RA during activity. Pt SOB after performing dressing tasks EOB but took rest breaks as needed.  Pt reports he has tub bench but does not use it. Edudcated pt on using tub bench in  shower to conserve energy as well as lowering temperature of water in shower.  Pt verbalized understanding.  Pt reports he has chairs located throughout home for that he can take  a seated rest break as necessary when home.    OT Diagnosis:    OT Problem List:   OT Treatment Interventions:     OT Goals    Visit Information  Last OT Received On: 12/30/11 Assistance Needed: +1    Subjective Data      Prior Functioning     Home Living Lives With: Spouse Available Help at Discharge: Family;Available 24 hours/day Type of Home: Apartment Home Access: Stairs to enter Entrance Stairs-Number of Steps: 2 Entrance Stairs-Rails: Right Home Layout: Two level;Bed/bath upstairs Alternate Level Stairs-Number of Steps: 13 Alternate Level Stairs-Rails: Right Bathroom Shower/Tub: Engineer, manufacturing systems: Standard Home Adaptive Equipment: Straight cane;Tub transfer bench Prior Function Level of Independence: Independent Able to Take Stairs?: Yes Driving: Yes Vocation: Retired Musician: No difficulties Dominant Hand: Right         Vision/Perception     Cognition  Overall Cognitive Status: Appears within functional limits for tasks assessed/performed Arousal/Alertness: Awake/alert Orientation Level: Appears intact for tasks assessed Behavior During Session: [facility] for tasks performed    Extremity/Trunk Assessment Right Upper Extremity Assessment RUE ROM/Strength/Tone: [facility] for tasks assessed (Simultaneous filing. User may not have seen previous data.) Left Upper Extremity Assessment LUE ROM/Strength/Tone: [facility] for tasks assessed (Simultaneous filing. User may not have seen previous data.) Right Lower Extremity Assessment RLE ROM/Strength/Tone: WFL for tasks assessed RLE Sensation: WFL - Light Touch Left Lower Extremity Assessment LLE ROM/Strength/Tone: WFL for tasks assessed LLE Sensation: WFL - Light Touch Trunk Assessment Trunk  Assessment: Normal      Mobility Bed Mobility Bed Mobility: Supine to Sit (Simultaneous filing. User may not have seen previous data.) Supine to Sit: 6: Modified independent (Device/Increase time);HOB elevated Sitting - Scoot to Edge of Bed: 6: Modified independent (Device/Increase time) Details for Bed Mobility Assistance: manages well Transfers Transfers: Sit to Stand;Stand to Sit Sit to Stand: 6: Modified independent (Device/Increase time);From bed;With upper extremity assist Stand to Sit: To bed;To chair/3-in-1;6: Modified independent (Device/Increase time) Details for Transfer Assistance: Pt. needed to sit relatively quickly upon initial standing from bed due to increased in SOB.  After brief rest, was able to continue.     Shoulder Instructions     Exercise     Balance     End of Session OT - End of Session Equipment Utilized During Treatment: Gait belt Activity Tolerance: Patient tolerated treatment well Patient left: in chair;with call bell/phone within reach Nurse Communication: Mobility status (O2 sats)  GO    12/30/2011 Cipriano Mile OTR/L Pager [phone] Office [phone]  Cipriano Mile 12/30/2011, 9:01 AM

## 2011-12-30 NOTE — Progress Notes (Addendum)
Nutrition Follow-up  Intervention:  Continue current interventions.  Assessment:   Pt is consuming 100% of meals, now on Regular diet. Consuming Ensure as scheduled. Per chart, pt requiring an additional 48 - 72 hours of inpatient stay to assure medically stable for d/c.  Evaluated by oncology on 10/1 for myelodysplastic syndrome with significant pancytopenia; pt given treatment options and prefers supportive care at this point.  Skin: No skin breakdown noted. Notable labs: potassium elevated at 5.2 Pertinent meds: protonix Last BM: 10/1  Diet Order:  Regular  Supplements: Ensure Complete PO BID  Meds: Scheduled Meds:   . darbepoetin (ARANESP) injection - NON-DIALYSIS  150 mcg Subcutaneous Q Fri-1800  . feeding supplement  237 mL Oral BID BM  . isosorbide mononitrate  30 mg Oral QPM  . metoprolol  50 mg Oral QHS  . pantoprazole  40 mg Oral BID AC  . sodium chloride  3 mL Intravenous Q12H  . Tamsulosin HCl  0.8 mg Oral QHS   Continuous Infusions:  PRN Meds:.acetaminophen, acetaminophen, albuterol, HYDROcodone-acetaminophen, HYDROmorphone (DILAUDID) injection, nitroGLYCERIN, ondansetron (ZOFRAN) IV, ondansetron  Labs:  CMP     Component Value Date/Time   NA 136 12/30/2011 0555   NA 139 12/16/2011 0952   K 5.2* 12/30/2011 0555   K 4.9 12/16/2011 0952   CL 108 12/30/2011 0555   CL 115* 12/16/2011 0952   CO2 17* 12/30/2011 0555   CO2 16* 12/16/2011 0952   GLUCOSE 101* 12/30/2011 0555   GLUCOSE 126* 12/16/2011 0952   BUN 48* 12/30/2011 0555   BUN 54.0* 12/16/2011 0952   CREATININE 2.51* 12/30/2011 0555   CREATININE 3.2 Repeated and Verified* 12/16/2011 0952   CALCIUM 8.6 12/30/2011 0555   CALCIUM 7.8* 12/16/2011 0952   CALCIUM 11.0* 11/05/2011 1552   PROT 6.7 12/24/2011 1555   PROT 6.0* 12/16/2011 0952   ALBUMIN 3.3* 12/28/2011 0500   ALBUMIN 3.2* 12/16/2011 0952   AST 32 12/24/2011 1555   AST 29 12/16/2011 0952   ALT 35 12/24/2011 1555   ALT 31 12/16/2011 0952   ALKPHOS 72 12/24/2011 1555     ALKPHOS 64 12/16/2011 0952   BILITOT 1.5* 12/24/2011 1555   BILITOT 1.10 12/16/2011 0952   GFRNONAA 21* 12/30/2011 0555   GFRAA 24* 12/30/2011 0555     Intake/Output Summary (Last 24 hours) at 12/30/11 1009 Last data filed at 12/30/11 0934  Gross per 24 hour  Intake    360 ml  Output   1075 ml  Net   -715 ml   Weight Status:  187 lb - wt up 5 lb  Re-estimated needs:  1500 - 1700 kcal, 65 - 75 grams protein  Nutrition Dx:  Inadequate oral intake r/t decline in taste AEB pt dietary recall and ongoing wt loss. Resolved  Goal:  Pt to meet >/= 90% of their estimated nutrition needs - met  Monitor:  weight trends, lab trends, I/O's, PO intake, supplement tolerance  Jarold Motto MS, RD, LDN Pager: [phone] After-hours pager: [phone]

## 2011-12-30 NOTE — Evaluation (Signed)
Physical Therapy Evaluation Patient Details Name: Tracy [name] MRN: [id number] DOB: May 20, 1919 Today's Date: 12/30/2011 Time: 4098-1191 PT Time Calculation (min): 27 min  PT Assessment / Plan / Recommendation Clinical Impression  Pt. was admitted with symptomatic anemia from short stay. He has received transfusion and Hgb up to 8.7 Pt. was hospitalized recently for UGI bleed and elevated calcium.  He presents with decrease in his activity tolerance and with SOB with activity and will benefit from acute PT  to address these and below issues.  Of note, pt. is on coumadin.    PT Assessment  Patient needs continued PT services    Follow Up Recommendations  No PT follow up;Other (comment) (pt. would likely benefit from pulmonary rehab as an outpatie)    Barriers to Discharge None      Equipment Recommendations  None recommended by PT    Recommendations for Other Services     Frequency Min 3X/week    Precautions / Restrictions Precautions Precautions: None;Other (comment) (watch O2 sats) Restrictions Weight Bearing Restrictions: No   Pertinent Vitals/Pain O2 sats at rest on room air 91%, fluctuated with activity on room air with low of 85%; returned to 92% at rest on edge of bed.  HR in 89 to 101 range during session.      Mobility  Bed Mobility Bed Mobility: Supine to Sit (Simultaneous filing. User may not have seen previous data.) Supine to Sit: 6: Modified independent (Device/Increase time);HOB elevated Sitting - Scoot to Edge of Bed: 6: Modified independent (Device/Increase time) Details for Bed Mobility Assistance: manages well Transfers Transfers: Sit to Stand;Stand to Sit Sit to Stand: 6: Modified independent (Device/Increase time);From bed;With upper extremity assist Stand to Sit: To bed;To chair/3-in-1;6: Modified independent (Device/Increase time) Details for Transfer Assistance: Pt. needed to sit relatively quickly upon initial standing from bed due to increased in  SOB.  After brief rest, was able to continue. Ambulation/Gait Ambulation/Gait Assistance: 5: Supervision Ambulation Distance (Feet): 50 Feet Assistive device: None Ambulation/Gait Assistance Details: Pt. initially had single point in hand but did not use it at all, just carried it.  Pt. with no overt LOB, even with 360 turn. Gait Pattern: Within Functional Limits Stairs: No    Shoulder Instructions     Exercises     PT Diagnosis: Difficulty walking  PT Problem List: Decreased activity tolerance;Cardiopulmonary status limiting activity PT Treatment Interventions: DME instruction;Gait training;Stair training;Functional mobility training;Therapeutic activities;Therapeutic exercise;Patient/family education   PT Goals Acute Rehab PT Goals PT Goal Formulation: With patient Time For Goal Achievement: 01/06/12 Potential to Achieve Goals: Good Pt will go Supine/Side to Sit: Independently PT Goal: Supine/Side to Sit - Progress: Goal set today Pt will go Sit to Stand: Independently PT Goal: Sit to Stand - Progress: Goal set today Pt will go Stand to Sit: Independently PT Goal: Stand to Sit - Progress: Goal set today Pt will Ambulate: 51 - 150 feet;with modified independence;with least restrictive assistive device;Other (comment) (while maintaining O2 sats at 90% or greater on room air) PT Goal: Ambulate - Progress: Goal set today Pt will Go Up / Down Stairs: Flight;with supervision;with rail(s) PT Goal: Up/Down Stairs - Progress: Goal set today  Visit Information  Last PT Received On: 12/30/11 Assistance Needed: +1 PT/OT Co-Evaluation/Treatment: Yes    Subjective Data  Subjective: "I aint old yet" Patient Stated Goal: "I want to get up and get out"; "go maore than 4 steps without being short of breath"   Prior Functioning  Home Living Lives  With: Spouse Available Help at Discharge: Family;Available 24 hours/day Type of Home: Apartment Home Access: Stairs to enter Entrance  Stairs-Number of Steps: 2 Entrance Stairs-Rails: Right Home Layout: Two level;Bed/bath upstairs Alternate Level Stairs-Number of Steps: 13 Alternate Level Stairs-Rails: Right Bathroom Shower/Tub: Engineer, manufacturing systems: Standard Home Adaptive Equipment: Straight cane;Tub transfer bench Prior Function Level of Independence: Independent Able to Take Stairs?: Yes Driving: Yes Vocation: Retired Musician: No difficulties Dominant Hand: Right    Cognition  Overall Cognitive Status: Appears within functional limits for tasks assessed/performed Arousal/Alertness: Awake/alert Orientation Level: Appears intact for tasks assessed Behavior During Session: [name] for tasks performed    Extremity/Trunk Assessment Right Upper Extremity Assessment RUE ROM/Strength/Tone: Tracy [name] for tasks assessed (Simultaneous filing. User may not have seen previous data.) Left Upper Extremity Assessment LUE ROM/Strength/Tone: Tracy [name] for tasks assessed (Simultaneous filing. User may not have seen previous data.) Right Lower Extremity Assessment RLE ROM/Strength/Tone: WFL for tasks assessed RLE Sensation: WFL - Light Touch Left Lower Extremity Assessment LLE ROM/Strength/Tone: WFL for tasks assessed LLE Sensation: WFL - Light Touch Trunk Assessment Trunk Assessment: Normal   Balance    End of Session PT - End of Session Equipment Utilized During Treatment: Gait belt Activity Tolerance: Treatment limited secondary to medical complications (Comment);Other (comment) (linmited by shortness of breath) Patient left: in chair;with call bell/phone within reach Nurse Communication: Mobility status  GP     Tracy [name] 12/30/2011, 9:05 AM Weldon Picking PT Acute Rehab Services [phone] Beeper [phone]

## 2011-12-30 NOTE — Progress Notes (Signed)
[facility] Progress Note  Tracy [name] GEX:[id number] DOB: Jul 07, 1919 DOA: 12/24/2011 PCP: [name], MD   Assessment/Plan:  Acute blood loss Anemia + CKD associated anemia + myelodysplasia left the hospital on 9/20 with a hemoglobin of 8.3 and on 9/27 Hgb 6.6 - received a total of 4 units of blood thus far during this hospital stay - receives Aranesp injections as outpt and his last one was on the day of admission - Hgb is holding steady/improving at this time - suspect the acute drop was related to his myelodysplasia (see discussion below in regard to Hematology eval) complicated by ongoing low grade epistaxis, low grad hematuria, as well as baseline anemia of renal disease - given coronary hx, will transfuse as needed to keep Hgb >8.0 -Hemoglobin drifting down to 7.9 today, will transfuse a unit of packed red blood cells to keep hemoglobin greater than 8.  Warfarin-induced coagulopathy  INR normalized with Vitamin K and FFP - the patient has himself decided that he does not wish to resume coumadin dosing at any point   Spontaneous Epistaxis  Due to coagulopathy and thrombocytopenia - persisted despite FFP, dDAVP and vitamin K - was transfused a unit of plts  - If bleeding continues will need to call ENT for packing or cauterization  - pt states that he has not noticed any significant nasal bleeding overnight  Urethral bleed  Pt has continued to ooze blood from urethra for 3 wks after I and O cath - see recommendations per urology, he is to follow up outpatient with Dr. [name] negative staph urine culture It is possible that this is contributing to the patient's urethral bleed - he does report undergoing multiple and out caths during a recent hospital stay - will start Rocephin today and treat the patient with a seven-day course of antibiotics.  Right Testicular enlargement  Ultrasound confirmed this to be a hydrocele - conservative measures as per urology, follow up  outpatient.  Atrial fibrillation  Currently rate controlled - cont current meds - pt does not wish be on coumadin/blood thinner of any kind from this point forward   Chronic diastolic heart failure  He states he no longer takes Lasix - was given Lasix in between units of blood - well compensated at present   Pancytopenia / MDS  Under care of Dr [name] - received Aranesp and "shots for his low white count" recently as outpatient - Platelets have dropped significantly during his hospital stay - is now s/p transfusion of a unit of platelets w/ good response - watch plt count and bleeding (at this time appears to have impacted the epistaxis at least) -As per Dr. [name] 10/1 note he has preferred supportive care when given options in the past to chemotherapy with the Vidaza CKD (chronic kidney disease), stage IV  Baseline crt appears to be approx 3 - stable - he has undergone vein mapping and eventaully needs vascular access for future dialysis   Non-gap metabolic acidosis Due to CKD - appears to be stable at this time  Recent Upper GI bleed (9/9)/ hematemesis while on coumadin He underwent an EGD on 9/12 which revealed mild gastritis - no evidence of signif GI blood loss at this time   Essential hypertension, benign  Cont Lopressor - well controlled at present  CAD/ NSTEMI  Cont Imdur and B blocker - not on ASA due to bleeding and low plts - agree that this is probably demand ischemia - Troponins improved - no  chest pain - keep Hgb >/= 8.0   Brief narrative: 78 year old male with multiple medical problems including coronary disease, stage III CKD, chronic atrial fibrillation on Coumadin (currently on hold), history of hypertension, hyperlipidemia presented to ED with symptomatic anemia.  Patient had been recently discharged from the hospital on 12/10/2011 when he was admitted for upper GI bleed/hematemesis and hypercalcemia. The patient had extensive workup for hypercalcemia and etiology  seems to be unclear.  The patient had episode of nausea and hematemesis during the previous admission and he underwent EGD which was essentially normal and his Coumadin was resumed. He also was found to have pneumonia during that admission.  Per patient, he had his INR checked on Wednesday prior to admission and it was 8.9. He was told to hold Coumadin and eat "greens". Patient presented to short stay on 8/28 for an Aranesp injection and was found to have a hemoglobin of 6.6. Patient admitted to having shortness of breath with exertion, generalized weakness, fatigue and intermittent epistaxis for last 3 days prior to presentation.    Code Status: Full Disposition Plan: To home when medically stable Consultants: Heme Urology  Antibiotics: Rocephin started today 10/3  DVT prophylaxis: SCDs in setting of bleeding  HPI/Subjective: The patient reports he had very small amount of epistaxis last pm. Continues to have dyspnea on exertion and feels very tired. he does not want any supplemental oxygen. He denies chest pain. Still with minimal urethral bleeding.   Objective: Blood pressure 159/99, pulse 99, temperature 98.6 F (37 C), temperature source Oral, resp. rate 19, height 6' (1.829 m), weight 85.231 kg (187 lb 14.4 oz), SpO2 96.00%.  Intake/Output Summary (Last 24 hours) at 12/30/11 1205 Last data filed at 12/30/11 0934  Gross per 24 hour  Intake    240 ml  Output    925 ml  Net   -685 ml     Exam: General: No acute respiratory distress at rest but easily winded with physical exam Lungs: Clear to auscultation bilaterally without wheezes or crackles Cardiovascular: Regular rate without appreciable murmur gallop or rub Abdomen: Nontender, nondistended, soft, bowel sounds positive, no rebound, no ascites, no appreciable mass Extremities: No significant cyanosis, clubbing, or edema bilateral lower extremities  Data Reviewed: Basic Metabolic Panel:  Lab 12/30/11 1610 12/28/11 0500  12/27/11 0445 12/26/11 0435 12/25/11 0032 12/24/11 1430  NA 136 137 133* 134* 134* --  K 5.2* 4.8 4.7 4.6 5.0 --  CL 108 108 107 105 106 --  CO2 17* 16* 15* 16* 14* --  GLUCOSE 101* 108* 120* 101* 116* --  BUN 48* 50* 50* 54* 55* --  CREATININE 2.51* 2.54* 2.84* 3.14* 3.26* --  CALCIUM 8.6 9.2 8.8 8.7 8.7 --  MG -- -- -- -- -- --  PHOS -- 3.5 -- -- -- 3.5   Liver Function Tests:  Lab 12/28/11 0500 12/24/11 1555 12/24/11 1430  AST -- 32 --  ALT -- 35 --  ALKPHOS -- 72 --  BILITOT -- 1.5* --  PROT -- 6.7 --  ALBUMIN 3.3* 3.5 3.5   CBC:  Lab 12/30/11 0555 12/29/11 0518 12/28/11 0500 12/27/11 0445 12/26/11 0435 12/24/11 1555  WBC 2.3* 3.2* 3.5* 3.8* 4.1 --  NEUTROABS -- -- -- -- -- 2.2  HGB 7.9* 8.7* 8.1* 7.9* 8.1* --  HCT 24.8* 27.6* 25.6* 24.9* 25.2* --  MCV 92.9 92.6 90.1 91.5 90.6 --  PLT 30* 31* 17* 23* 18* --   Cardiac Enzymes:  Lab 12/25/11 2018 12/25/11  4098 12/25/11 0031  CKTOTAL -- 97 95  CKMB -- 3.7 3.6  CKMBINDEX -- -- --  TROPONINI 0.96* 1.21* 1.31*   BNP (last 3 results)  Basename 12/08/11 1600 08/04/11 1241 02/16/11 1044  PROBNP 9654.0* 601.0* 3945.0*    Recent Results (from the past 240 hour(s))  MRSA PCR SCREENING     Status: Normal   Collection Time   12/24/11  7:24 PM      Component Value Range Status Comment   MRSA by PCR NEGATIVE  NEGATIVE Final   URINE CULTURE     Status: Normal   Collection Time   12/25/11  3:16 AM      Component Value Range Status Comment   Specimen Description URINE, RANDOM   Final    Special Requests NONE   Final    Culture  Setup Time 12/25/2011 11:38   Final    Colony Count >=100,000 COLONIES/ML   Final    Culture     Final    Value: STAPHYLOCOCCUS SPECIES (COAGULASE NEGATIVE)     Note: RIFAMPIN AND GENTAMICIN SHOULD NOT BE USED AS SINGLE DRUGS FOR TREATMENT OF STAPH INFECTIONS.   Report Status 12/30/2011 FINAL   Final    Organism ID, Bacteria STAPHYLOCOCCUS SPECIES (COAGULASE NEGATIVE)   Final     Studies:  I  have reviewed Recent x-ray studies.  Scheduled Meds:  Reviewed in detail.   Linus Salmons [facility] Office  [phone] Pager [phone]  On-Call/Text Page:      [email or web address]      password [email or web address]  If 7PM-7AM, please contact night-coverage [email or web address] Password [email or web address] 12/30/2011, 12:05 PM   LOS: 6 days

## 2011-12-31 DIAGNOSIS — E875 Hyperkalemia: Secondary | ICD-10-CM

## 2011-12-31 DIAGNOSIS — N183 Chronic kidney disease, stage 3 unspecified: Secondary | ICD-10-CM

## 2011-12-31 LAB — TYPE AND SCREEN
ABO/RH(D): O NEG
Antibody Screen: NEGATIVE
Unit division: 0

## 2011-12-31 LAB — BASIC METABOLIC PANEL
Calcium: 9 mg/dL (ref 8.4–10.5)
GFR calc Af Amer: 25 mL/min — ABNORMAL LOW (ref >90)
GFR calc non Af Amer: 22 mL/min — ABNORMAL LOW (ref >90)
Glucose, Bld: 104 mg/dL — ABNORMAL HIGH (ref 70–99)
Potassium: 5.4 mEq/L — ABNORMAL HIGH (ref 3.5–5.1)
Sodium: 136 mEq/L (ref 135–145)

## 2011-12-31 LAB — PATHOLOGIST SMEAR REVIEW

## 2011-12-31 LAB — CBC
Hemoglobin: 8.8 g/dL — ABNORMAL LOW (ref 13.0–17.0)
MCHC: 31.7 g/dL (ref 30.0–36.0)
Platelets: 27 10*3/uL — CL (ref 150–400)

## 2011-12-31 MED ORDER — FUROSEMIDE 10 MG/ML IJ SOLN
40.0000 mg | Freq: Once | INTRAMUSCULAR | Status: AC
  Administered 2011-12-31: 40 mg via INTRAVENOUS
  Filled 2011-12-31: qty 4

## 2011-12-31 MED ORDER — SODIUM POLYSTYRENE SULFONATE 15 GM/60ML PO SUSP
30.0000 g | Freq: Once | ORAL | Status: AC
  Administered 2011-12-31: 30 g via ORAL
  Filled 2011-12-31 (×2): qty 60

## 2011-12-31 NOTE — Progress Notes (Signed)
Pt. BP 169/108 with machine and 150/90 manually,MD was notified.keep assessing pt. closely

## 2011-12-31 NOTE — Progress Notes (Signed)
Physical Therapy Treatment Patient Details Name: Tracy [name] MRN: [id number] DOB: Jul 04, 1919 Today's Date: 12/31/2011 Time: 5621-3086 PT Time Calculation (min): 23 min  PT Assessment / Plan / Recommendation Comments on Treatment Session  Pt. with sats at 90 and above with walking, though more dyspnic today than yesterday.  He was more "tired" and not as energetic today.  Note low platelet count today.    Follow Up Recommendations  No PT follow up;Other (comment)    Barriers to Discharge        Equipment Recommendations  None recommended by PT    Recommendations for Other Services    Frequency Min 3X/week   Plan Discharge plan remains appropriate    Precautions / Restrictions Precautions Precautions: None;Other (comment) Restrictions Weight Bearing Restrictions: No   Pertinent Vitals/Pain No pain, O2 sats 90 and above on RA with walking.  Dyspnea 2.4 with walking    Mobility  Bed Mobility Bed Mobility: Not assessed Transfers Transfers: Sit to Stand;Stand to Sit Sit to Stand: 6: Modified independent (Device/Increase time);From bed;With upper extremity assist Stand to Sit: To bed;To chair/3-in-1;6: Modified independent (Device/Increase time) Details for Transfer Assistance: Pt. able to manage sit <> stand today without difficulty at mod I level Ambulation/Gait Ambulation/Gait Assistance: 5: Supervision Ambulation Distance (Feet): 200 Feet Assistive device: None Ambulation/Gait Assistance Details: pt. needed to ambulate at slow pace due to his feeling of tiredness today.  No overt LOB and "appears" stable on his feet without device.  Had  dyspnea 2/4 by end of walk, and was able to recover sitting at edge of bed.   Gait Pattern: Within Functional Limits Gait velocity: slowed pace due to feeling tired Stairs: No    Exercises     PT Diagnosis:    PT Problem List:   PT Treatment Interventions:     PT Goals Acute Rehab PT Goals PT Goal: Sit to Stand - Progress:  Progressing toward goal PT Goal: Stand to Sit - Progress: Progressing toward goal PT Goal: Ambulate - Progress: Progressing toward goal  Visit Information  Last PT Received On: 12/31/11 Assistance Needed: +1 PT/OT Co-Evaluation/Treatment: Yes    Subjective Data  Subjective: "I feel weak today"   Cognition  Overall Cognitive Status: Appears within functional limits for tasks assessed/performed Arousal/Alertness: Awake/alert Orientation Level: Appears intact for tasks assessed Behavior During Session: [facility] for tasks performed    Balance     End of Session PT - End of Session Equipment Utilized During Treatment: Gait belt Patient left: Other (comment);with call bell/phone within reach (at [facility]) Nurse Communication: Mobility status   GP     Ferman Hamming 12/31/2011, 10:55 AM Weldon Picking PT Acute Rehab Services [phone] Beeper [phone]

## 2011-12-31 NOTE — Progress Notes (Signed)
Received a call from the lab with critical platelets levels of 27.MD. Was notified.keep assessing pt. Closely.

## 2011-12-31 NOTE — Progress Notes (Addendum)
[facility] Progress Note  Tracy [name] JXB:[id number] DOB: 09-12-1919 DOA: 12/24/2011 PCP: [name], MD   Assessment/Plan:  Acute blood loss Anemia + CKD associated anemia + myelodysplasia left the hospital on 9/20 with a hemoglobin of 8.3 and on 9/27 Hgb 6.6 - received a total of 4 units of blood thus far during this hospital stay - receives Aranesp injections as outpt and his last one was on the day of admission - Hgb is holding steady/improving at this time - suspect the acute drop was related to his myelodysplasia (see discussion below in regard to Hematology eval) complicated by ongoing low grade epistaxis, low grad hematuria, as well as baseline anemia of renal disease - given coronary hx, will transfuse as needed to keep Hgb >8.0 -Hemoglobin improved status post transfusion, and continue Aranesp.  Warfarin-induced coagulopathy  INR normalized with Vitamin K and FFP - the patient has himself decided that he does not wish to resume coumadin dosing at any point   Spontaneous Epistaxis  Due to coagulopathy and thrombocytopenia - persisted despite FFP, dDAVP and vitamin K - was transfused a unit of plts  - If bleeding continues will need to call ENT for packing or cauterization  -Resolving- he has not noticed any nasal bleeding overnight  Urethral bleed  Pt has continued to ooze blood from urethra for 3 wks after I and O cath - see recommendations per urology, he is to follow up outpatient with Dr. [name] negative staph urine culture It is possible that this is contributing to the patient's urethral bleed - he does report undergoing multiple and out caths during a recent hospital stay - continue Rocephin started on 10/3 and treat the patient with a seven-day course of antibiotics.  Right Testicular enlargement  Ultrasound confirmed this to be a hydrocele - conservative measures as per urology, follow up outpatient.  Atrial fibrillation  Currently rate controlled -  cont current meds - pt does not wish be on coumadin/blood thinner of any kind from this point forward   Chronic diastolic heart failure  He states he no longer takes Lasix - was given Lasix in between units of blood - well compensated at present   Pancytopenia / MDS/thrombocytopenia Under care of Dr [name] - received Aranesp and "shots for his low white count" recently as outpatient - Platelets have dropped significantly during his hospital stay - is now s/p transfusion of a unit of platelets w/ good response - watch plt count and bleeding (at this time appears to have impacted the epistaxis at least) -As per Dr. [name] 10/1 note he has preferred supportive care when given options in the past to chemotherapy with the Vidaza -Discussed his worsening thrombocytopenia with oncology (Dr. Myna Hidalgo on-call for [facility]) and he agrees with transfusing platelets. CKD (chronic kidney disease), stage IV  Baseline crt appears to be approx 3 - stable - he has undergone vein mapping and eventaully needs vascular access for future dialysis   Non-gap metabolic acidosis Due to CKD - appears to be stable at this time  Recent Upper GI bleed (9/9)/ hematemesis while on coumadin He underwent an EGD on 9/12 which revealed mild gastritis - no evidence of signif GI blood loss at this time   Essential hypertension, benign  Cont Lopressor - well controlled at present  CAD/ NSTEMI  Cont Imdur and B blocker - not on ASA due to bleeding and low plts - agree that this is probably demand ischemia - Troponins improved - no  chest pain - keep Hgb >/= 8.0 Hyperkalemia -Kayexalate, follow and recheck  Brief narrative: 78 year old male with multiple medical problems including coronary disease, stage III CKD, chronic atrial fibrillation on Coumadin (currently on hold), history of hypertension, hyperlipidemia presented to ED with symptomatic anemia.  Patient had been recently discharged from the hospital on 12/10/2011 when he  was admitted for upper GI bleed/hematemesis and hypercalcemia. The patient had extensive workup for hypercalcemia and etiology seems to be unclear.  The patient had episode of nausea and hematemesis during the previous admission and he underwent EGD which was essentially normal and his Coumadin was resumed. He also was found to have pneumonia during that admission.  Per patient, he had his INR checked on Wednesday prior to admission and it was 8.9. He was told to hold Coumadin and eat "greens". Patient presented to short stay on 8/28 for an Aranesp injection and was found to have a hemoglobin of 6.6. Patient admitted to having shortness of breath with exertion, generalized weakness, fatigue and intermittent epistaxis for last 3 days prior to presentation.    Code Status: Full Disposition Plan: To home when medically stable Consultants: Heme Urology  Antibiotics: Rocephin started 10/3  DVT prophylaxis: SCDs in setting of bleeding  HPI/Subjective: Denies any further epistaxis overnight, but still has some urethral spotting. Continues to have dyspnea on exertion and still feels tired. O2 sats on ambulation with PTin 90s   Objective: Blood pressure 126/75, pulse 82, temperature 97.8 F (36.6 C), temperature source Oral, resp. rate 19, height 6' (1.829 m), weight 85.186 kg (187 lb 12.8 oz), SpO2 94.00%.  Intake/Output Summary (Last 24 hours) at 12/31/11 1156 Last data filed at 12/31/11 0700  Gross per 24 hour  Intake    660 ml  Output    800 ml  Net   -140 ml     Exam: General: No acute respiratory distress at rest  Lungs: Clear to auscultation bilaterally without wheezes or crackles Cardiovascular: Regular rate without appreciable murmur gallop or rub Abdomen: Nontender, nondistended, soft, bowel sounds positive, no rebound, no ascites, no appreciable mass Extremities: No significant cyanosis, clubbing, or edema bilateral lower extremities  Data Reviewed: Basic Metabolic  Panel:  Lab 12/31/11 0625 12/30/11 1318 12/30/11 0555 12/28/11 0500 12/27/11 0445 12/26/11 0435 12/24/11 1430  NA 136 -- 136 137 133* 134* --  K 5.4* 5.4* 5.2* 4.8 4.7 -- --  CL 107 -- 108 108 107 105 --  CO2 18* -- 17* 16* 15* 16* --  GLUCOSE 104* -- 101* 108* 120* 101* --  BUN 52* -- 48* 50* 50* 54* --  CREATININE 2.45* -- 2.51* 2.54* 2.84* 3.14* --  CALCIUM 9.0 -- 8.6 9.2 8.8 8.7 --  MG -- -- -- -- -- -- --  PHOS -- -- -- 3.5 -- -- 3.5   Liver Function Tests:  Lab 12/28/11 0500 12/24/11 1555 12/24/11 1430  AST -- 32 --  ALT -- 35 --  ALKPHOS -- 72 --  BILITOT -- 1.5* --  PROT -- 6.7 --  ALBUMIN 3.3* 3.5 3.5   CBC:  Lab 12/31/11 0625 12/30/11 0555 12/29/11 0518 12/28/11 0500 12/27/11 0445 12/24/11 1555  WBC 3.0* 2.3* 3.2* 3.5* 3.8* --  NEUTROABS -- -- -- -- -- 2.2  HGB 8.8* 7.9* 8.7* 8.1* 7.9* --  HCT 27.8* 24.8* 27.6* 25.6* 24.9* --  MCV 91.7 92.9 92.6 90.1 91.5 --  PLT 27* 30* 31* 17* 23* --   Cardiac Enzymes:  Lab 12/25/11 2018 12/25/11  1610 12/25/11 0031  CKTOTAL -- 97 95  CKMB -- 3.7 3.6  CKMBINDEX -- -- --  TROPONINI 0.96* 1.21* 1.31*   BNP (last 3 results)  Basename 12/08/11 1600 08/04/11 1241 02/16/11 1044  PROBNP 9654.0* 601.0* 3945.0*    Recent Results (from the past 240 hour(s))  MRSA PCR SCREENING     Status: Normal   Collection Time   12/24/11  7:24 PM      Component Value Range Status Comment   MRSA by PCR NEGATIVE  NEGATIVE Final   URINE CULTURE     Status: Normal   Collection Time   12/25/11  3:16 AM      Component Value Range Status Comment   Specimen Description URINE, RANDOM   Final    Special Requests NONE   Final    Culture  Setup Time 12/25/2011 11:38   Final    Colony Count >=100,000 COLONIES/ML   Final    Culture     Final    Value: STAPHYLOCOCCUS SPECIES (COAGULASE NEGATIVE)     Note: RIFAMPIN AND GENTAMICIN SHOULD NOT BE USED AS SINGLE DRUGS FOR TREATMENT OF STAPH INFECTIONS.   Report Status 12/30/2011 FINAL   Final     Organism ID, Bacteria STAPHYLOCOCCUS SPECIES (COAGULASE NEGATIVE)   Final     Studies:  I have reviewed Recent x-ray studies.  Scheduled Meds:  Reviewed in detail.   Time spent: 35 minutes  Linus Salmons [facility] Office  [phone] Pager [phone]  On-Call/Text Page:      [email or web address]      password [email or web address]  If 7PM-7AM, please contact night-coverage [email or web address] Password [email or web address] 12/31/2011, 11:56 AM   LOS: 7 days

## 2012-01-01 LAB — CBC
HCT: 26.1 % — ABNORMAL LOW (ref 39.0–52.0)
MCH: 29.4 pg (ref 26.0–34.0)
MCHC: 31.8 g/dL (ref 30.0–36.0)
MCV: 92.6 fL (ref 78.0–100.0)
Platelets: 45 10*3/uL — ABNORMAL LOW (ref 150–400)
RDW: 19.6 % — ABNORMAL HIGH (ref 11.5–15.5)

## 2012-01-01 LAB — BASIC METABOLIC PANEL
BUN: 52 mg/dL — ABNORMAL HIGH (ref 6–23)
CO2: 18 mEq/L — ABNORMAL LOW (ref 19–32)
Calcium: 8.9 mg/dL (ref 8.4–10.5)
Chloride: 108 mEq/L (ref 96–112)
Creatinine, Ser: 2.47 mg/dL — ABNORMAL HIGH (ref 0.50–1.35)
Glucose, Bld: 103 mg/dL — ABNORMAL HIGH (ref 70–99)

## 2012-01-01 LAB — PREPARE PLATELET PHERESIS

## 2012-01-01 NOTE — Progress Notes (Signed)
[facility] Progress Note  Tracy [name] ZOX:[id number] DOB: 07-Aug-1919 DOA: 12/24/2011 PCP: [name], MD   Assessment/Plan:  Acute blood loss Anemia + CKD associated anemia + myelodysplasia left the hospital on 9/20 with a hemoglobin of 8.3 and on 9/27 Hgb 6.6 - received a total of 4 units of blood thus far during this hospital stay - receives Aranesp injections as outpt and his last one was on the day of admission - Hgb is holding steady/improving at this time - suspect the acute drop was related to his myelodysplasia (see discussion below in regard to Hematology eval) complicated by ongoing low grade epistaxis, low grad hematuria, as well as baseline anemia of renal disease - given coronary hx, will transfuse as needed to keep Hgb >8.0 -no significant change in Hemoglobin status post transfusion, and continue Aranesp.  Warfarin-induced coagulopathy  INR normalized with Vitamin K and FFP - the patient has himself decided that he does not wish to resume coumadin dosing at any point   Spontaneous Epistaxis  Due to coagulopathy and thrombocytopenia - persisted despite FFP, dDAVP and vitamin K - was transfused a 2nd unit of plts on 10/4  -minimal amt of blood with blowing his nose, monitor- If bleeding increases will need to call ENT for packing or cauterization.   Urethral bleed  Pt has continued to ooze blood from urethra for 3 wks after I and O cath - but now slowing down; see recommendations per urology, he is to follow up outpatient with Dr. [name] negative staph urine culture It is possible that this is contributing to the patient's urethral bleed - he does report undergoing multiple and out caths during a recent hospital stay - continue Rocephin started on 10/3 and treat the patient with a seven-day course of antibiotics.  Right Testicular enlargement  Ultrasound confirmed this to be a hydrocele - conservative measures as per urology, follow up outpatient.  Atrial  fibrillation  Currently rate controlled - cont current meds - pt does not wish be on coumadin/blood thinner of any kind from this point forward   Chronic diastolic heart failure  He states he no longer takes Lasix - was given Lasix in between units of blood - well compensated at present   Pancytopenia / MDS/thrombocytopenia Under care of Dr [name] - received Aranesp and "shots for his low white count" recently as outpatient - Platelets have dropped significantly during his hospital stay - is now s/p transfusion of a unit of platelets w/ good response - watch plt count and bleeding (at this time appears to have impacted the epistaxis at least) -As per Dr. [name] 10/1 note he has preferred supportive care when given options in the past to chemotherapy with the Vidaza -Discussed his worsening thrombocytopenia with oncology (Dr. Myna Hidalgo on-call for [facility]) on 10/4, and pt was transfused 1unit platelet and plts improved today. CKD (chronic kidney disease), stage IV  Baseline crt appears to be approx 3 - stable - he has undergone vein mapping and eventaully needs vascular access for future dialysis   Non-gap metabolic acidosis Due to CKD - appears to be stable at this time  Recent Upper GI bleed (9/9)/ hematemesis while on coumadin He underwent an EGD on 9/12 which revealed mild gastritis - no evidence of signif GI blood loss at this time   Essential hypertension, benign  Cont Lopressor - well controlled at present  CAD/ NSTEMI  Cont Imdur and B blocker - not on ASA due to bleeding and  low plts - agree that this is probably demand ischemia - Troponins improved - no chest pain - keep Hgb >/= 8.0 Hyperkalemia -resolved, s/p Kayexalate.  Brief narrative: 78 year old male with multiple medical problems including coronary disease, stage III CKD, chronic atrial fibrillation on Coumadin (currently on hold), history of hypertension, hyperlipidemia presented to ED with symptomatic anemia.  Patient  had been recently discharged from the hospital on 12/10/2011 when he was admitted for upper GI bleed/hematemesis and hypercalcemia. The patient had extensive workup for hypercalcemia and etiology seems to be unclear.  The patient had episode of nausea and hematemesis during the previous admission and he underwent EGD which was essentially normal and his Coumadin was resumed. He also was found to have pneumonia during that admission.  Per patient, he had his INR checked on Wednesday prior to admission and it was 8.9. He was told to hold Coumadin and eat "greens". Patient presented to short stay on 8/28 for an Aranesp injection and was found to have a hemoglobin of 6.6. Patient admitted to having shortness of breath with exertion, generalized weakness, fatigue and intermittent epistaxis for last 3 days prior to presentation.    Code Status: Full Disposition Plan: To home when medically stable Consultants: Heme Urology  Antibiotics: Rocephin started 10/3  DVT prophylaxis: SCDs in setting of bleeding  HPI/Subjective: C/o  small amt epistaxis just with blowing nose this amt, states urethral spotting much decreased.family/daughter at bedside  Objective: Blood pressure 148/91, pulse 91, temperature 97.5 F (36.4 C), temperature source Oral, resp. rate 18, height 6\' 1"  (1.854 m), weight 84.3 kg (185 lb 13.6 oz), SpO2 91.00%.  Intake/Output Summary (Last 24 hours) at 01/01/12 1710 Last data filed at 01/01/12 1600  Gross per 24 hour  Intake    620 ml  Output   1400 ml  Net   -780 ml     Exam: General: No acute respiratory distress at rest  Lungs: Clear to auscultation bilaterally without wheezes or crackles Cardiovascular: Regular rate without appreciable murmur gallop or rub Abdomen: Nontender, nondistended, soft, bowel sounds positive, no rebound, no ascites, no appreciable mass Extremities: No significant cyanosis, clubbing, or edema bilateral lower extremities  Data Reviewed: Basic  Metabolic Panel:  Lab 01/01/12 5621 12/31/11 0625 12/30/11 1318 12/30/11 0555 12/28/11 0500 12/27/11 0445  NA 138 136 -- 136 137 133*  K 4.4 5.4* 5.4* 5.2* 4.8 --  CL 108 107 -- 108 108 107  CO2 18* 18* -- 17* 16* 15*  GLUCOSE 103* 104* -- 101* 108* 120*  BUN 52* 52* -- 48* 50* 50*  CREATININE 2.47* 2.45* -- 2.51* 2.54* 2.84*  CALCIUM 8.9 9.0 -- 8.6 9.2 8.8  MG -- -- -- -- -- --  PHOS -- -- -- -- 3.5 --   Liver Function Tests:  Lab 12/28/11 0500  AST --  ALT --  ALKPHOS --  BILITOT --  PROT --  ALBUMIN 3.3*   CBC:  Lab 01/01/12 0500 12/31/11 0625 12/30/11 0555 12/29/11 0518 12/28/11 0500  WBC 2.9* 3.0* 2.3* 3.2* 3.5*  NEUTROABS -- -- -- -- --  HGB 8.3* 8.8* 7.9* 8.7* 8.1*  HCT 26.1* 27.8* 24.8* 27.6* 25.6*  MCV 92.6 91.7 92.9 92.6 90.1  PLT 45* 27* 30* 31* 17*   Cardiac Enzymes:  Lab 12/25/11 2018  CKTOTAL --  CKMB --  CKMBINDEX --  TROPONINI 0.96*   BNP (last 3 results)  Basename 12/08/11 1600 08/04/11 1241 02/16/11 1044  PROBNP 9654.0* 601.0* 3945.0*  Recent Results (from the past 240 hour(s))  MRSA PCR SCREENING     Status: Normal   Collection Time   12/24/11  7:24 PM      Component Value Range Status Comment   MRSA by PCR NEGATIVE  NEGATIVE Final   URINE CULTURE     Status: Normal   Collection Time   12/25/11  3:16 AM      Component Value Range Status Comment   Specimen Description URINE, RANDOM   Final    Special Requests NONE   Final    Culture  Setup Time 12/25/2011 11:38   Final    Colony Count >=100,000 COLONIES/ML   Final    Culture     Final    Value: STAPHYLOCOCCUS SPECIES (COAGULASE NEGATIVE)     Note: RIFAMPIN AND GENTAMICIN SHOULD NOT BE USED AS SINGLE DRUGS FOR TREATMENT OF STAPH INFECTIONS.   Report Status 12/30/2011 FINAL   Final    Organism ID, Bacteria STAPHYLOCOCCUS SPECIES (COAGULASE NEGATIVE)   Final     Studies:  I have reviewed Recent x-ray studies.  Scheduled Meds:  Reviewed in detail.   Time spent: 35  minutes  Tracy [name] [facility] Office  [phone] Pager [phone]  On-Call/Text Page:      [email or web address]      password [email or web address]  If 7PM-7AM, please contact night-coverage [email or web address] Password [email or web address] 01/01/2012, 5:10 PM   LOS: 8 days

## 2012-01-02 LAB — CBC
MCH: 29.2 pg (ref 26.0–34.0)
MCHC: 31.4 g/dL (ref 30.0–36.0)
RDW: 19.6 % — ABNORMAL HIGH (ref 11.5–15.5)

## 2012-01-02 LAB — BASIC METABOLIC PANEL
Calcium: 8.6 mg/dL (ref 8.4–10.5)
GFR calc Af Amer: 25 mL/min — ABNORMAL LOW (ref >90)
GFR calc non Af Amer: 22 mL/min — ABNORMAL LOW (ref >90)
Glucose, Bld: 94 mg/dL (ref 70–99)
Potassium: 4.2 mEq/L (ref 3.5–5.1)
Sodium: 137 mEq/L (ref 135–145)

## 2012-01-02 MED ORDER — CEFUROXIME AXETIL 500 MG PO TABS: 500.0000 mg | ORAL_TABLET | Freq: Two times a day (BID) | ORAL | Status: DC

## 2012-01-02 NOTE — Discharge Summary (Signed)
Physician Discharge Summary  Tracy [name] ZOX:[id number] DOB: March 10, 1973 DOA: 12/24/2011  PCP: [name], MD  Admit date: 12/24/2011 Discharge date: 01/02/2012  Recommendations for Outpatient Follow-up:      Follow-up Information    Follow up with [name],[name], MD. (in 1-2weeks)    Contact information:   [address] [place] [address] [phone]       Follow up with [name], MD. (in 1week, call for appt upon discharge)    Contact information:   [address] [place] [address] [phone]       Follow up with [name], MD. (call for appt upon disicharge)    Contact information:   [address] [place] [address] [phone]       Follow up with [name], MD. (call for appt upon discharge)    Contact information:   [address] [place] [address] [phone]         Pulmonary rehabilitation- as scheduled  ENT -outpt as needed    Discharge Diagnoses:  Principal Problem:  *Anemia associated with acute blood loss Active Problems:  HYPERLIPIDEMIA  Essential hypertension, benign  CAD  NSTEMI (non-ST elevated myocardial infarction)  Atrial fibrillation  Chronic diastolic heart failure  Pancytopenia  Epistaxis  CKD (chronic kidney disease), stage IV  MDS (myelodysplastic syndrome)  Warfarin-induced coagulopathy  Hyperkalemia   Discharge Condition: Improved/stable  Diet recommendation: Heart healthy  Filed Weights   12/30/11 2235 12/31/11 2036 01/01/12 2044  Weight: 85.186 kg (187 lb 12.8 oz) 84.3 kg (185 lb 13.6 oz) 84.052 kg (185 lb 4.8 oz)    History of present illness:  Patient is a 78 year old male with multiple medical problems including coronary disease, stage III CKD, chronic atrial fibrillation on Coumadin, currently on hold, history of hypertension, hyperlipidemia presented to ED with symptomatic anemia.patient had been recently discharged from the  hospital on 12/10/2011 when he was admitted for upper GI bleed/hematemesis and hypercalcemia.patient had extensive workup for hypercalcemia and etiology seems to be unclear but resolved.patient had episode of nausea and hematemesis the during the previous admission and he underwent EGD which was essentially normal. He also was found to have pneumonia. Per patient, he had his INR checked on Wednesday 2 days ago and was 8.9. He was told to hold Coumadin and eat "greens". Patient presented to short stay today for Aranesp injection and was found to have a hemoglobin of 6.6. Patient was having shortness of breath with exertion, generalized weakness and fatigue and intermittent epistaxis for last 3 days. In route to the hospital patient also had an episode of nausea and vomiting today. He denied any chest pain, productive cough or any fever or chills.he denied any gross hematochezia or melena.   Hospital Course by problem list:  Acute blood loss Anemia + CKD associated anemia + myelodysplasia  left the hospital on 9/20 with a hemoglobin of 8.3 and on 9/27 Hgb 6.6 - received a total of 4 units of blood thus far during this hospital stay - receives Aranesp injections as outpt and his last one was on the day of admission - Hgb is holding steady/improving at this time - suspect the acute drop was related to his myelodysplasia (see discussion below in regard to Hematology eval) complicated by ongoing low grade epistaxis, low grad hematuria, as well as baseline anemia of renal disease - given coronary hx, will transfuse as needed to keep Hgb >8.0  -Hemoglobin remaining stable-8.2 this  a.m.; and no reports of this epistaxis or urethral spotting overnight, continue Aranesp.  Warfarin-induced coagulopathy  INR normalized with Vitamin K and FFP - the patient has himself decided that he does not wish to resume coumadin dosing at any point  Spontaneous Epistaxis  Due to coagulopathy and thrombocytopenia - persisted despite  FFP, dDAVP and vitamin K - was transfused a total of 2 units of for pheresed  platelets during this hospital stay with good response and platelet count this a.m. 53 and no further bleeding reported overnight. Outpatient followup with ENT recommended as needed.  Urethral bleed  Pt has continued to ooze blood from urethra for 3 wks after I and O cath - urology was consulted and he was seen by Dr. Patsi Sears and recommended outpatient follow up with Dr. Ronal Fear. Also she was transfused pheresed platelets as above and that has seemed help with the bleeding. Coag negative staph urine culture  It is possible that this is contributing to the patient's urethral bleed - he does report undergoing multiple and out caths during a recent hospital stay - continue Rocephin started on 10/3 and treat the patient with a seven-day course of antibiotics. He has remained afebrile and hemodynamically stable, he will be discharged on Ceftin for 3 more days. Right Testicular enlargement  Ultrasound confirmed this to be a hydrocele - conservative measures as per urology, follow up outpatient.  Atrial fibrillation  Currently rate controlled - cont current meds - pt does not wish be on coumadin/blood thinner of any kind from this point forward. His to follow up outpatient with Dr. Antoine Poche  Chronic diastolic heart failure  He states he no longer takes Lasix - was given Lasix in between units of blood - well compensated at present  Pancytopenia / MDS/thrombocytopenia  Under care of Dr [name] - received Aranesp and "shots for his low white count" recently as outpatient - Platelets have dropped significantly during his hospital stay - is now s/p transfusion of a unit of platelets w/ good response - watch plt count and bleeding (at this time appears to have impacted the epistaxis at least)  -As per Dr. [name] 10/1 note he has preferred supportive care when given options in the past to chemotherapy with the Vidaza  -Discussed his  worsening thrombocytopenia with oncology (Dr. Myna Hidalgo on-call for [facility]) and he agrees with transfusing platelets.  CKD (chronic kidney disease), stage IV  Baseline crt appears to be approx 3 - stable - he has undergone vein mapping and eventaully needs vascular access for future dialysis  Non-gap metabolic acidosis  ?Due to CKD - resolved Recent Upper GI bleed (9/9)/ hematemesis while on coumadin  He underwent an EGD on 9/12 which revealed mild gastritis - no evidence of signif GI blood loss at this time  Essential hypertension, benign  Cont Lopressor - well controlled at present  CAD/ NSTEMI  Cont Imdur and B blocker - not on ASA due to bleeding and low plts - agree that this is probably demand ischemia - Troponins improved - no chest pain - keep Hgb >/= 8.0  Hyperkalemia  -Kayexalate, follow and recheck   Procedures:  none  Consultations:  Hematology  Urology  Discharge Exam: Filed Vitals:   01/01/12 1805 01/01/12 2044 01/02/12 0420 01/02/12 1000  BP: 154/76 161/109 133/77 159/90  Pulse: 91 88 72 94  Temp: 97.7 F (36.5 C) 97.5 F (36.4 C) 98.1 F (36.7 C) 97.5 F (36.4 C)  TempSrc: Oral Oral Oral Oral  Resp: 19  20 18 18   Height:      Weight:  84.052 kg (185 lb 4.8 oz)    SpO2: 94% 96% 94% 96%   Exam:  General: No acute respiratory distress at rest  Lungs: Clear to auscultation bilaterally without wheezes or crackles  Cardiovascular: Regular rate without appreciable murmur gallop or rub  Abdomen: Nontender, nondistended, soft, bowel sounds positive, no rebound, no ascites, no appreciable mass  Extremities: No significant cyanosis, clubbing, or edema bilateral lower extremities  Discharge Instructions  Discharge Orders    Future Appointments: Provider: Department: Dept Phone: Center:   01/04/2012 1:30 PM [facility] [phone] None   01/06/2012 1:30 PM [facility] [phone] None    01/11/2012 1:30 PM [facility] [phone] None   01/12/2012 1:15 PM Beverely Pace Shumate [facility] [phone] None   01/12/2012 1:45 PM Si Gaul, MD [facility] [phone] None   01/13/2012 1:30 PM [facility] [phone] None   01/18/2012 1:30 PM [facility] [phone] None   01/20/2012 1:30 PM [facility] [phone] None   01/25/2012 1:30 PM [facility] [phone] None   01/27/2012 1:30 PM [facility] [phone] None   02/01/2012 1:30 PM [facility] [phone] None   02/03/2012 1:30 PM [facility] [phone] None   02/08/2012 1:30 PM [facility] [phone] None   02/10/2012 1:30 PM [facility] [phone] None   02/15/2012 1:30 PM [facility] [phone] None   02/17/2012 1:30 PM [facility] [phone] None   02/22/2012 1:30 PM [facility] [phone] None   02/24/2012 1:30 PM [facility] [phone] None   02/29/2012 1:30 PM [facility] [phone] None   03/02/2012 1:30 PM [facility] [phone] None   03/07/2012 1:30 PM [facility] [phone] None   03/09/2012 1:30 PM [facility] [phone] None   03/14/2012 1:30 PM [facility] [phone] None   03/16/2012 1:30 PM [facility] [phone] None   03/21/2012 1:30 PM [facility] [phone]  None   03/23/2012 1:30 PM [facility] [phone] None   03/28/2012 1:30 PM [facility] [phone] None   03/30/2012 11:00 AM Lbct-Ct 1 Lbct-Ct Imaging [phone] [facility]   03/30/2012 1:30 PM [facility] [phone] None   04/04/2012 1:30 PM [facility] [phone] None   04/06/2012 1:30 PM [facility] [phone] None     Future Orders Please Complete By Expires   Diet Heart      Increase activity slowly          Medication List     As of 01/02/2012 11:38 AM    TAKE these medications         acetaminophen 500 MG tablet   Commonly known as: TYLENOL   Take 500 mg by mouth every 6 (six) hours as needed. For pain      ARANESP (ALBUMIN FREE) 150 MCG/0.75ML Soln   Generic drug: Darbepoetin Alfa-Polysorbate   Inject 150 mg as directed every 7 (seven) days. Friday.      FLOMAX 0.4 MG Caps   Generic drug: Tamsulosin HCl  Take 0.8 mg by mouth at bedtime. Take 2 tabs daily      isosorbide mononitrate 30 MG 24 hr tablet   Commonly known as: IMDUR   Take 30 mg by mouth every evening.      metoprolol 50 MG tablet   Commonly known as: LOPRESSOR   Take 50 mg by mouth at bedtime.      nitroGLYCERIN 0.4 MG SL tablet   Commonly known as: NITROSTAT   Place 0.4 mg under the tongue every 5 (five) minutes as needed. For chest pain            Ceftin 500 mg tablet        Take 500 mg by mouth 2 times a day     Follow-up Information    Follow up with Marjory Lies A, MD. (in 1-2weeks)    Contact information:   [address] [place] [address] [phone]       Follow up with [name], MD. (in 1week, call for appt upon discharge)    Contact information:   [address] [place] [address] [phone]       Follow up with [name], MD. (call for appt upon  disicharge)    Contact information:   [address] [place] [address] [phone]       Follow up with [name], MD. (call for appt upon discharge)    Contact information:   [address] [place] [address] [phone]           The results of significant diagnostics from this hospitalization (including imaging, microbiology, ancillary and laboratory) are listed below for reference.    Significant Diagnostic Studies: Dg Chest 2 View  12/08/2011  *RADIOLOGY REPORT*  Clinical Data: Pneumonia.  Short of breath.  Previous myocardial infarct.  Atrial fibrillation.  CHEST - 2 VIEW  Comparison: 12/04/2011  Findings: Changes of COPD are again seen.  Mild cardiomegaly stable.  Left lower lobe airspace disease shows no significant change, consistent with pneumonia.  Right lung is clear.  No evidence of pleural effusion.  IMPRESSION:  1. No significant change in left lower lobe airspace disease, consistent with pneumonia. 2.  Stable mild cardiomegaly and COPD.   Original Report Authenticated By: Danae Orleans, M.D.    Dg Chest 2 View  12/04/2011  *RADIOLOGY REPORT*  Clinical Data: COPD.  CHEST - 2 VIEW  Comparison: 08/04/2011  Findings: New airspace disease is seen in the left lower lobe, consistent with pneumonia.  Changes of COPD again demonstrated. Mild cardiomegaly is stable.  No evidence of congestive heart failure or pleural effusion.  IMPRESSION:  1.  New left lower lobe airspace disease, consistent with pneumonia. 2.  COPD. 3.  Stable mild cardiomegaly.   Original Report Authenticated By: Danae Orleans, M.D.    US Scrotum  12/28/2011  *RADIOLOGY REPORT*  Clinical Data: Evaluate right hydrocele. Left testicle surgically removed due to old trauma.  ULTRASOUND OF SCROTUM  Technique:  Complete ultrasound examination of the testicles, epididymis, and other scrotal structures was performed.  Comparison:  CT 10/13/2011.  Findings:  Right testis:  2.9 x 2.6 x 2.7 cm.   Normal echotexture.  No focal abnormality.  Color Doppler flow visualized.  Left testis:  Prior resection.  Right epididymis:  4 mm cyst.  Otherwise unremarkable.  Left epididymis:  Prior resection.  Hydrocele:  Large right hydrocele.  Varicocele:  Mild right varicocele.  IMPRESSION: Right testes  unremarkable.  There is a large right hydrocele and small right varicocele.   Original Report Authenticated By: Cyndie Chime, M.D.    Mr Mrcp  12/06/2011  *RADIOLOGY REPORT*  Clinical Data:  Weight loss, dilated common duct and pancreatic duct on prior exam and possible common duct stone.  Anorexia.  MRI ABDOMEN WITHOUT CONTRAST (MRCP)  Technique: Multiplanar multisequence MR imaging of the abdomen was performed, including heavily T2-weighted images of the biliary and pancreatic ducts.  Three-dimensional MR images were rendered by post processing of the original MR data.  Comparison:  CT abdomen/pelvis 10/13/2011  Findings:  Trace perinephric stranding and bilateral T2 hyperintense renal cortical cysts are again noted, largest left lower renal pole 3.5 cm.  These are stable allowing for differences in technique.  No hydronephrosis.  Common duct is mildly dilated measuring 1.1 cm at its midportion.  No pancreatic ductal dilatation.  T2 hyperintense too small to characterize 4 mm hyperintense focus left hepatic lobe lateral segment image 24 series 3 is most likely a cyst or possibly biliary hamartoma.  This may be too small to be previously visualized.  Spleen, adrenal glands, pancreas, and gallbladder are unremarkable.  No lymphadenopathy or ascites.  Disc degenerative changes are noted in the spine.  Some series are degraded by patient respiratory motion. Allowing for this, on heavily T2-weighted images, there is no focal filling defect within the common duct.  There is gradual tapering to the level of the ampulla.  The liver is markedly hypointense on T1 and T2-weighted imaging which may indicate iron deposition.  No  lymphadenopathy.  IMPRESSION: Mild common duct dilatation to 1 cm without filling defect allowing for mild motion artifact.  Gradual tapering to the ampulla is noted and there is no intrahepatic ductal dilatation.  Hepatic hypointensity which may indicate iron deposition.   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  No secondary imaging findings to suggest cirrhosis.   Original Report Authenticated By: Harrel Lemon, M.D.    Dg Chest Port 1 View  12/24/2011  *RADIOLOGY REPORT*  Clinical Data: Hypertension and anemia.  PORTABLE CHEST - 1 VIEW  Comparison: 12/08/2011  Findings: Left lower lobe airspace disease seen previously persists without substantial interval change.  Right lung remains clear. Interstitial markings are diffusely coarsened with chronic features. The cardiopericardial silhouette is enlarged.  IMPRESSION: No substantial change.  Cardiomegaly with persistent airspace opacity involving the left mid and lower lung.   Original Report Authenticated By: ERIC A. MANSELL, M.D.     Microbiology: Recent Results (from the past 240 hour(s))  MRSA PCR SCREENING     Status: Normal   Collection Time   12/24/11  7:24 PM      Component Value Range Status Comment   MRSA by PCR NEGATIVE  NEGATIVE Final   URINE CULTURE     Status: Normal   Collection Time   12/25/11  3:16 AM      Component Value Range Status Comment   Specimen Description URINE, RANDOM   Final    Special Requests NONE   Final    Culture  Setup Time 12/25/2011 11:38   Final    Colony Count >=100,000 COLONIES/ML   Final    Culture     Final    Value: STAPHYLOCOCCUS SPECIES (COAGULASE NEGATIVE)     Note: RIFAMPIN AND GENTAMICIN SHOULD NOT BE USED AS SINGLE DRUGS FOR TREATMENT OF STAPH INFECTIONS.   Report Status 12/30/2011 FINAL   Final  Organism ID, Bacteria STAPHYLOCOCCUS SPECIES (COAGULASE NEGATIVE)   Final      Labs: Basic Metabolic Panel:  Lab 01/02/12 9147 01/01/12 0500  12/31/11 0625 12/30/11 1318 12/30/11 0555 12/28/11 0500  NA 137 138 136 -- 136 137  K 4.2 4.4 5.4* 5.4* 5.2* --  CL 106 108 107 -- 108 108  CO2 19 18* 18* -- 17* 16*  GLUCOSE 94 103* 104* -- 101* 108*  BUN 48* 52* 52* -- 48* 50*  CREATININE 2.42* 2.47* 2.45* -- 2.51* 2.54*  CALCIUM 8.6 8.9 9.0 -- 8.6 9.2  MG -- -- -- -- -- --  PHOS -- -- -- -- -- 3.5   Liver Function Tests:  Lab 12/28/11 0500  AST --  ALT --  ALKPHOS --  BILITOT --  PROT --  ALBUMIN 3.3*   No results found for this basename: LIPASE:5,AMYLASE:5 in the last 168 hours No results found for this basename: AMMONIA:5 in the last 168 hours CBC:  Lab 01/02/12 0550 01/01/12 0500 12/31/11 0625 12/30/11 0555 12/29/11 0518  WBC 2.5* 2.9* 3.0* 2.3* 3.2*  NEUTROABS -- -- -- -- --  HGB 8.2* 8.3* 8.8* 7.9* 8.7*  HCT 26.1* 26.1* 27.8* 24.8* 27.6*  MCV 92.9 92.6 91.7 92.9 92.6  PLT 53* 45* 27* 30* 31*   Cardiac Enzymes: No results found for this basename: CKTOTAL:5,CKMB:5,CKMBINDEX:5,TROPONINI:5 in the last 168 hours BNP: BNP (last 3 results)  Basename 12/08/11 1600 08/04/11 1241 02/16/11 1044  PROBNP 9654.0* 601.0* 3945.0*   CBG: No results found for this basename: GLUCAP:5 in the last 168 hours  Time coordinating discharge:>69mins  Signed:  Jazzmyn Filion C  [facility] 01/02/2012, 11:38 AM

## 2012-01-03 ENCOUNTER — Encounter (HOSPITAL_COMMUNITY): Admission: RE | Payer: Self-pay | Source: Ambulatory Visit

## 2012-01-03 ENCOUNTER — Ambulatory Visit (HOSPITAL_COMMUNITY): Admission: RE | Admit: 2012-01-03 | Payer: Medicare Other | Source: Ambulatory Visit | Admitting: Urology

## 2012-01-03 ENCOUNTER — Other Ambulatory Visit: Payer: Self-pay | Admitting: Urology

## 2012-01-03 ENCOUNTER — Telehealth: Payer: Self-pay | Admitting: Cardiology

## 2012-01-03 SURGERY — TRANSURETHRAL RESECTION OF THE PROSTATE WITH GYRUS INSTRUMENTS
Anesthesia: Choice

## 2012-01-04 ENCOUNTER — Encounter (HOSPITAL_COMMUNITY)
Admission: RE | Admit: 2012-01-04 | Discharge: 2012-01-04 | Disposition: A | Discharge status: Home / Self Care | Payer: Medicare Other | Source: Ambulatory Visit | Attending: Nephrology | Admitting: Nephrology

## 2012-01-04 DIAGNOSIS — D649 Anemia, unspecified: Secondary | ICD-10-CM | POA: Insufficient documentation

## 2012-01-04 NOTE — Progress Notes (Signed)
First day of exercise in [facility].  Oriented to equipment use, RPE and Dyspnea Scale and rest breaks.  Tolerated exercise well.  Demonstration and practice of PLB technique.  Patient able to return demonstration and effectively do PLB on all stations  Oxygen saturations remained above 90% on RA.  He refuses to use oxygen for exercise due to recent nose bleeds which he believes were precipitated by nasal canula.  We will monitor closely and keep work load and rest breaks at levels to keep SATs above 90%.  Very pleasant and cooperative gentleman.

## 2012-01-06 ENCOUNTER — Encounter (HOSPITAL_COMMUNITY)
Admission: RE | Admit: 2012-01-06 | Discharge: 2012-01-06 | Disposition: A | Discharge status: Home / Self Care | Payer: Medicare Other | Source: Ambulatory Visit | Attending: Pulmonary Disease | Admitting: Pulmonary Disease

## 2012-01-07 ENCOUNTER — Encounter (HOSPITAL_COMMUNITY): Payer: Medicare Other

## 2012-01-07 ENCOUNTER — Encounter (HOSPITAL_COMMUNITY)
Admission: RE | Admit: 2012-01-07 | Discharge: 2012-01-07 | Disposition: A | Discharge status: Home / Self Care | Payer: Medicare Other | Source: Ambulatory Visit | Attending: Nephrology | Admitting: Nephrology

## 2012-01-07 LAB — FERRITIN: Ferritin: 259 ng/mL (ref 22–322)

## 2012-01-07 LAB — IRON AND TIBC
Saturation Ratios: 53 % (ref 20–55)
UIBC: 129 ug/dL (ref 125–400)

## 2012-01-07 MED ORDER — DARBEPOETIN ALFA-POLYSORBATE 150 MCG/0.3ML IJ SOLN
150.0000 ug | INTRAMUSCULAR | Status: DC
  Administered 2012-01-07: 150 ug via SUBCUTANEOUS

## 2012-01-10 ENCOUNTER — Encounter: Payer: Medicare Other | Admitting: Physician Assistant

## 2012-01-10 ENCOUNTER — Other Ambulatory Visit (HOSPITAL_COMMUNITY): Payer: Self-pay | Admitting: *Deleted

## 2012-01-10 LAB — POCT HEMOGLOBIN-HEMACUE: Hemoglobin: 8.9 g/dL — ABNORMAL LOW (ref 13.0–17.0)

## 2012-01-10 MED FILL — Darbepoetin Alfa-Polysorbate 80 Soln Inj 150 MCG/0.3ML: INTRAMUSCULAR | Qty: 0.3 | Status: AC

## 2012-01-11 ENCOUNTER — Encounter (HOSPITAL_COMMUNITY): Admission: RE | Admit: 2012-01-11 | Discharge: 2012-01-11 | Payer: Medicare Other | Source: Ambulatory Visit

## 2012-01-11 ENCOUNTER — Encounter: Payer: Self-pay | Admitting: Physician Assistant

## 2012-01-11 ENCOUNTER — Ambulatory Visit (INDEPENDENT_AMBULATORY_CARE_PROVIDER_SITE_OTHER): Payer: Medicare Other | Admitting: Physician Assistant

## 2012-01-11 VITALS — BP 150/96 | HR 90 | Ht 73.0 in | Wt 188.0 lb

## 2012-01-11 DIAGNOSIS — R0602 Shortness of breath: Secondary | ICD-10-CM

## 2012-01-11 DIAGNOSIS — I5032 Chronic diastolic (congestive) heart failure: Secondary | ICD-10-CM

## 2012-01-11 DIAGNOSIS — I25810 Atherosclerosis of coronary artery bypass graft(s) without angina pectoris: Secondary | ICD-10-CM

## 2012-01-11 DIAGNOSIS — I4891 Unspecified atrial fibrillation: Secondary | ICD-10-CM

## 2012-01-11 LAB — BASIC METABOLIC PANEL
Calcium: 8.9 mg/dL (ref 8.4–10.5)
GFR: 25.54 mL/min — ABNORMAL LOW (ref >60.00)
Glucose, Bld: 95 mg/dL (ref 70–99)
Sodium: 134 mEq/L — ABNORMAL LOW (ref 135–145)

## 2012-01-11 MED ORDER — METOPROLOL TARTRATE 50 MG PO TABS: 50.0000 mg | ORAL_TABLET | Freq: Two times a day (BID) | ORAL | Status: DC

## 2012-01-11 NOTE — Progress Notes (Signed)
[address] [place], [place]  [address] Phone: [phone] Fax:  [phone]  Date:  01/11/2012   Name:  Tracy [name]   DOB:  [birth year]   MRN:  [id number]  PCP:  [name], MD  Primary Cardiologist:  Dr. [name]  Primary Electrophysiologist:  None    History of Present Illness: Tracy [name] is a 78 y.o. male who returns for follow up after recent hospitalization.  He has a hx of CAD, s/p prior PCI to the OM in 1997 and stenting to the mid to distal RCA in 2001, HTN, HL, stage III CKD, prior TIA, pancytopenia likely due to MDS, AFib, diastolic CHF.  He suffered a NSTEMI in 11/12 in the setting of diastolic heart failure c/b AFib with RVR. He was started on Coumadin. With his CKD, age and a recent low risk Myoview, medical therapy was pursued.  Last seen by Dr. Antoine Poche 8/13. He was cleared for urologic surgery at that time.  Admitted 9/6-9/13 with hypercalcemia. Etiology was not clear. He was treated with pamidronate.  He was also asked reduce his dietary potassium. Patient did have hematemesis. EGD was normal. He was seen by cardiology for elevated troponins. This was felt to be insignificant. Medical therapy was continued. He was also treated for pneumonia.   Readmitted 9/27-10/6 with worsening anemia and thrombocytopenia.  Hgb was 6.6. He was transfused with PRBCs. Etiology of his worsening anemia was felt to be related to low-grade epistaxis in the setting of anemia of chronic disease and myelodysplasia. His INR was corrected with vitamin K and FFP. He was also transfused with platelets due to thrombocytopenia. He had a urethral bleed that was followed by urology. He was also treated for UTI. Patient opted to no longer take Coumadin. His volume remained stable.  He is frustrated.  He remains short of breath.  I reviewed his chart.  He is followed by pulmonology.  He has a hx of emphysema and pulmonary fibrosis.  He has been referred to pulmonary  rehab.  I spent > 50% of the visit today discussing his chronic health problems and how the contribute to his dyspnea.  He describes Class III-IIIb dyspnea.  No orthopnea, PND, edema.  No chest pain.  No syncope. He still needs to proceed with urologic procedure for BPH.   Labs (01/02/12):  K 4.2, creatinine 2.42, Hgb 8.2, PLT 53K   Wt Readings from Last 3 Encounters:  01/11/12 188 lb (85.276 kg)  01/01/12 185 lb 4.8 oz (84.052 kg)  12/16/11 180 lb 6.4 oz (81.829 kg)     Past Medical History  Diagnosis Date  . CAD (coronary artery disease)     status post prior PCI to the obtuse marginal in 1997 and stenting to the mid to distal RCA in 2001; LHC 9/06:  LM ok, pLAD 50%, mLAD 60% then 60-70%, mRI 50%, mOM1 80-90% (small), pRCA and dRCA stents ok, PDA 50%, EF 65%;  Myoview 5/12: No ischemia, EF 64%.;  NSTEMI 11/12 tx medically   . Chronic kidney disease, stage III (moderate)   . TIA (transient ischemic attack)   . HTN (hypertension)   . Hyperlipidemia   . AF (atrial fibrillation) 02/16/11    Coumadin ==> patient decided to stop after admxn with worsening anemia in setting of UGI bleed, epistaxis  . Arthritis     "in my left ankle"  . Chronic diastolic heart failure   . Anemia of chronic disease   .  Carotid stenosis     dopplers 03/2011: 0-39% bilateral  . MDS (myelodysplastic syndrome) 12/16/2011  . Stroke     5 yrs ago.  Mini   . Hx of echocardiogram     Echocardiogram 02/17/11: Severe LVH, asymmetric hypertrophy, EF 50-55%, normal wall motion, high ventricular filling pressure, mild LAE, mild RVE, mildly reduced RVSF, mild RAE.    Current Outpatient Prescriptions  Medication Sig Dispense Refill  . acetaminophen (TYLENOL) 500 MG tablet Take 500 mg by mouth every 6 (six) hours as needed. For pain      . Darbepoetin Alfa-Polysorbate (ARANESP, ALBUMIN FREE,) 150 MCG/0.75ML SOLN Inject 150 mg as directed every 7 (seven) days. Friday.      . isosorbide mononitrate (IMDUR) 30 MG 24 hr  tablet Take 30 mg by mouth every evening.      . metoprolol (LOPRESSOR) 50 MG tablet Take 50 mg by mouth at bedtime.       . nitroGLYCERIN (NITROSTAT) 0.4 MG SL tablet Place 0.4 mg under the tongue every 5 (five) minutes as needed. For chest pain      . Tamsulosin HCl (FLOMAX) 0.4 MG CAPS Take 0.8 mg by mouth at bedtime. Take 2 tabs daily       No current facility-administered medications for this visit.   Facility-Administered Medications Ordered in Other Visits  Medication Dose Route Frequency Provider Last Rate Last Dose  . darbepoetin (ARANESP) injection 150 mcg  150 mcg Subcutaneous Q7 days Zada Girt, MD   150 mcg at 01/07/12 1446    Allergies: Allergies  Allergen Reactions  . Oxycodone Hcl Other (See Comments)    Wanted to climb the wall  . Prednisone Other (See Comments)    Gain weight     History  Substance Use Topics  . Smoking status: Former Smoker -- 2.0 packs/day for 40 years    Types: Cigarettes, Pipe    Quit date: 08/24/1978  . Smokeless tobacco: Never Used   Comment: 1980 quit smoking cigarrettes and pipe  . Alcohol Use: Yes     "glass of wine or beer once in awhile"     ROS:  Please see the history of present illness.   No melena, hematochezia, epistaxis.  All other systems reviewed and negative.   PHYSICAL EXAM: VS:  BP 150/96  Pulse 90  Ht 6\' 1"  (1.854 m)  Wt 188 lb (85.276 kg)  BMI 24.80 kg/m2 Well nourished, well developed, in no acute distress HEENT: normal Neck: no JVD Cardiac:  normal S1, S2; irregularly irregular rhythm ; no murmur Lungs:  clear to auscultation bilaterally, no wheezing, rhonchi or rales Abd: soft, nontender, no hepatomegaly Ext: trace bilateral LE edema Skin: warm and dry Neuro:  CNs 2-12 intact, no focal abnormalities noted   EKG:  AFib, HR 90, LAD, Inf Qs, NSSTTW changes      ASSESSMENT AND PLAN:  1. Coronary Artery Disease:  No angina.  He remains on beta blocker.  No further cardiac workup is planned prior to  his non-cardiac surgery.  With his co-morbidities, he moderate risk (at Route) for cardiovascular complications. Our service is available as necessary.  With his thrombocytopenia, I am not certain that ASA is advisable (now that he is off coumadin).  I reviewed with Dr. [name].  At this point, we do not recommend ASA.  Follow up with Dr. [name] in 4-6 weeks.  2. Atrial Fibrillation:  He refuses further coumadin Rx.  We discussed that without Rx, his stroke  risk is > 12% per year.  As noted, ASA would likely not be advisable with is low Platelets.  Also, there is no clear evidence that ASA is beneficial in stroke prevention with AFib.  Rate is controlled.  He only takes metoprolol once daily.  I will increase this to Metoprolol 50 mg BID.  3. Chronic Diastolic CHF:  Volume stable without Lasix.  Continue current Rx.  Check BMET today.  4. Chronic Lung Disease:  He has emphysema and pulmonary fibrosis.  We discussed that his dyspnea is multifactorial, related to lung disease, CHF, AFib, anemia and advanced age.  He is starting pulmonary rehab now.    5. Anemia:  Followed by hematology/oncology.    6. Chronic Kidney Disease:  Check BMET today.    SignedTereso Newcomer, PA-C  11:41 AM 01/11/2012

## 2012-01-11 NOTE — Patient Instructions (Addendum)
Your physician has recommended you make the following change in your medication: INCREASE METOPROLOL TO 50 MG TWICE DAILY  Your physician recommends that you return for lab work in: TODAY BMET  Your physician recommends that you schedule a follow-up appointment in: 02/16/12 @ 11:45 WITH DR. HOCHREIN

## 2012-01-12 ENCOUNTER — Ambulatory Visit (HOSPITAL_BASED_OUTPATIENT_CLINIC_OR_DEPARTMENT_OTHER): Payer: Medicare Other | Admitting: Internal Medicine

## 2012-01-12 ENCOUNTER — Ambulatory Visit (HOSPITAL_BASED_OUTPATIENT_CLINIC_OR_DEPARTMENT_OTHER): Payer: Medicare Other

## 2012-01-12 ENCOUNTER — Other Ambulatory Visit (HOSPITAL_BASED_OUTPATIENT_CLINIC_OR_DEPARTMENT_OTHER): Payer: Medicare Other | Admitting: Lab

## 2012-01-12 ENCOUNTER — Telehealth: Payer: Self-pay | Admitting: Internal Medicine

## 2012-01-12 ENCOUNTER — Telehealth: Payer: Self-pay | Admitting: *Deleted

## 2012-01-12 VITALS — BP 108/69 | HR 80 | Temp 97.0°F | Resp 20 | Ht 73.0 in | Wt 186.0 lb

## 2012-01-12 DIAGNOSIS — N289 Disorder of kidney and ureter, unspecified: Secondary | ICD-10-CM

## 2012-01-12 DIAGNOSIS — D638 Anemia in other chronic diseases classified elsewhere: Secondary | ICD-10-CM

## 2012-01-12 DIAGNOSIS — D709 Neutropenia, unspecified: Secondary | ICD-10-CM

## 2012-01-12 DIAGNOSIS — D469 Myelodysplastic syndrome, unspecified: Secondary | ICD-10-CM

## 2012-01-12 LAB — COMPREHENSIVE METABOLIC PANEL (CC13)
ALT: 37 U/L (ref 0–55)
AST: 26 U/L (ref 5–34)
Albumin: 3.5 g/dL (ref 3.5–5.0)
Alkaline Phosphatase: 73 U/L (ref 40–150)
Glucose: 102 mg/dl — ABNORMAL HIGH (ref 70–99)
Potassium: 5 mEq/L (ref 3.5–5.1)
Sodium: 136 mEq/L (ref 136–145)
Total Protein: 6.5 g/dL (ref 6.4–8.3)

## 2012-01-12 LAB — CBC WITH DIFFERENTIAL/PLATELET
BASO%: 0.6 % (ref 0.0–2.0)
Basophils Absolute: 0 10*3/uL (ref 0.0–0.1)
EOS%: 0 % (ref 0.0–7.0)
HGB: 9.2 g/dL — ABNORMAL LOW (ref 13.0–17.1)
MCH: 29.5 pg (ref 27.2–33.4)
RDW: 22.3 % — ABNORMAL HIGH (ref 11.0–14.6)
lymph#: 0.6 10*3/uL — ABNORMAL LOW (ref 0.9–3.3)

## 2012-01-12 MED ORDER — FILGRASTIM 300 MCG/0.5ML IJ SOLN
300.0000 ug | Freq: Once | INTRAMUSCULAR | Status: AC
  Administered 2012-01-12: 300 ug via SUBCUTANEOUS
  Filled 2012-01-12: qty 0.5

## 2012-01-12 NOTE — Telephone Encounter (Signed)
pt notified about lab results, verbalized understanding today 

## 2012-01-12 NOTE — Patient Instructions (Signed)
Neupogen for the next 3 days because of the low neutrophil count. Followup in one month

## 2012-01-12 NOTE — Telephone Encounter (Signed)
Message copied by Tarri Fuller on Wed Jan 12, 2012 12:26 PM ------      Message from: [place], [place] T      Created: Tue Jan 11, 2012  4:50 PM       Creatinine stable      Continue with current treatment plan.      Tereso Newcomer, PA-C  4:50 PM 01/11/2012

## 2012-01-12 NOTE — Progress Notes (Signed)
[facility] [phone]   Fax:([phone]  OFFICE PROGRESS NOTE  [name],[name], MD [address] [place] [address]  DIAGNOSIS: Myelodysplastic syndrome with translocation (1;3).   PRIOR THERAPY: Neupogen 300 mcg subcutaneously when necessary for neutropenia.   CURRENT THERAPY: Weekly Aranesp under the care of Dr. Caryn Section  INTERVAL HISTORY: Tracy [name] 78 y.o. male returns to the clinic today for followup visit accompanied by his wife. The patient continues to complain of fatigue and weakness. He was recently admitted to [facility] with acute blood loss anemia in addition to anemia of chronic disease and myelodysplasia. His hemoglobin on 12/24/2011 was down to 6.6. Patient received 4 units of packed rbc's. His platelets was also down to 17,000 with bleeding and ecchymosis. The patient received 1 unit of platelets during his hospitalization. He was also on anticoagulation because of atrial fibrillation which was discontinued during that time. He has repeat CBC performed earlier today and he is here for evaluation and discussion of his lab results. He still on weekly Aranesp under the care of Dr. Caryn Section.  MEDICAL HISTORY: Past Medical History  Diagnosis Date  . CAD (coronary artery disease)     status post prior PCI to the obtuse marginal in 1997 and stenting to the mid to distal RCA in 2001; LHC 9/06:  LM ok, pLAD 50%, mLAD 60% then 60-70%, mRI 50%, mOM1 80-90% (small), pRCA and dRCA stents ok, PDA 50%, EF 65%;  Myoview 5/12: No ischemia, EF 64%.;  NSTEMI 11/12 tx medically   . Chronic kidney disease, stage III (moderate)   . TIA (transient ischemic attack)   . HTN (hypertension)   . Hyperlipidemia   . AF (atrial fibrillation) 02/16/11    Coumadin ==> patient decided to stop after admxn with worsening anemia in setting of UGI bleed, epistaxis  . Arthritis     "in my left ankle"  . Chronic diastolic heart failure   . Anemia of chronic disease     . Carotid stenosis     dopplers 03/2011: 0-39% bilateral  . MDS (myelodysplastic syndrome) 12/16/2011  . Stroke     5 yrs ago.  Mini   . Hx of echocardiogram     Echocardiogram 02/17/11: Severe LVH, asymmetric hypertrophy, EF 50-55%, normal wall motion, high ventricular filling pressure, mild LAE, mild RVE, mildly reduced RVSF, mild RAE.    ALLERGIES:  is allergic to oxycodone hcl and prednisone.  MEDICATIONS:  Current Outpatient Prescriptions  Medication Sig Dispense Refill  . Darbepoetin Alfa-Polysorbate (ARANESP, ALBUMIN FREE,) 150 MCG/0.75ML SOLN Inject 150 mg as directed every 7 (seven) days. Friday.      . isosorbide mononitrate (IMDUR) 30 MG 24 hr tablet Take 30 mg by mouth every evening.      . metoprolol (LOPRESSOR) 50 MG tablet Take 1 tablet (50 mg total) by mouth 2 (two) times daily.  60 tablet  11  . Tamsulosin HCl (FLOMAX) 0.4 MG CAPS Take 0.8 mg by mouth at bedtime. Take 2 tabs daily      . acetaminophen (TYLENOL) 500 MG tablet Take 500 mg by mouth every 6 (six) hours as needed. For pain      . nitroGLYCERIN (NITROSTAT) 0.4 MG SL tablet Place 0.4 mg under the tongue every 5 (five) minutes as needed. For chest pain       No current facility-administered medications for this visit.   Facility-Administered Medications Ordered in Other Visits  Medication Dose Route Frequency Provider  Last Rate Last Dose  . darbepoetin (ARANESP) injection 150 mcg  150 mcg Subcutaneous Q7 days Zada Girt, MD   150 mcg at 01/07/12 1446    SURGICAL HISTORY:  Past Surgical History  Procedure Date  . Nose surgery 1985    deviated septum  . Total knee arthroplasty 2009    left  . Joint replacement 2009    left knee  . Appendectomy 1930's  . Tonsillectomy and adenoidectomy 1930's  . Coronary angioplasty with stent placement     3 procedures; 3 stents total  . Cataract extraction w/ intraocular lens  implant, bilateral   . Surgery scrotal / testicular 1971    "testicle crushed; removed"   . Esophagogastroduodenoscopy 12/09/2011    Procedure: ESOPHAGOGASTRODUODENOSCOPY (EGD);  Surgeon: Willis Modena, MD;  Location: [facility] ENDOSCOPY;  Service: Endoscopy;  Laterality: N/A;  outlaw/ebp    REVIEW OF SYSTEMS:  A comprehensive review of systems was negative except for: Constitutional: positive for anorexia, fatigue and weight loss   PHYSICAL EXAMINATION: General appearance: alert, cooperative and no distress Neck: no adenopathy Lymph nodes: Cervical, supraclavicular, and axillary nodes normal. Resp: clear to auscultation bilaterally Cardio: regular rate and rhythm, S1, S2 normal, no murmur, click, rub or gallop GI: soft, non-tender; bowel sounds normal; no masses,  no organomegaly Extremities: extremities normal, atraumatic, no cyanosis or edema  ECOG PERFORMANCE STATUS: 1 - Symptomatic but completely ambulatory  Blood pressure 108/69, pulse 80, temperature 97 F (36.1 C), temperature source Oral, resp. rate 20, height 6\' 1"  (1.854 m), weight 186 lb (84.369 kg).  LABORATORY DATA: Lab Results  Component Value Date   WBC 1.6* 01/12/2012   HGB 9.2* 01/12/2012   HCT 31.1* 01/12/2012   MCV 99.7* 01/12/2012   PLT 71* 01/12/2012      Chemistry      Component Value Date/Time   NA 134* 01/11/2012 1246   NA 139 12/16/2011 0952   K 4.8 01/11/2012 1246   K 4.9 12/16/2011 0952   CL 107 01/11/2012 1246   CL 115* 12/16/2011 0952   CO2 20 01/11/2012 1246   CO2 16* 12/16/2011 0952   BUN 42* 01/11/2012 1246   BUN 54.0* 12/16/2011 0952   CREATININE 2.5* 01/11/2012 1246   CREATININE 3.2 Repeated and Verified* 12/16/2011 0952      Component Value Date/Time   CALCIUM 8.9 01/11/2012 1246   CALCIUM 7.8* 12/16/2011 0952   CALCIUM 11.0* 11/05/2011 1552   ALKPHOS 72 12/24/2011 1555   ALKPHOS 64 12/16/2011 0952   AST 32 12/24/2011 1555   AST 29 12/16/2011 0952   ALT 35 12/24/2011 1555   ALT 31 12/16/2011 0952   BILITOT 1.5* 12/24/2011 1555   BILITOT 1.10 12/16/2011 0952       RADIOGRAPHIC  STUDIES: US Scrotum  12/28/2011  *RADIOLOGY REPORT*  Clinical Data: Evaluate right hydrocele. Left testicle surgically removed due to old trauma.  ULTRASOUND OF SCROTUM  Technique:  Complete ultrasound examination of the testicles, epididymis, and other scrotal structures was performed.  Comparison:  CT 10/13/2011.  Findings:  Right testis:  2.9 x 2.6 x 2.7 cm.  Normal echotexture.  No focal abnormality.  Color Doppler flow visualized.  Left testis:  Prior resection.  Right epididymis:  4 mm cyst.  Otherwise unremarkable.  Left epididymis:  Prior resection.  Hydrocele:  Large right hydrocele.  Varicocele:  Mild right varicocele.  IMPRESSION: Right testes unremarkable.  There is a large right hydrocele and small right varicocele.   Original Report Authenticated  By: Cyndie Chime, M.D.    Dg Chest Port 1 View  12/24/2011  *RADIOLOGY REPORT*  Clinical Data: Hypertension and anemia.  PORTABLE CHEST - 1 VIEW  Comparison: 12/08/2011  Findings: Left lower lobe airspace disease seen previously persists without substantial interval change.  Right lung remains clear. Interstitial markings are diffusely coarsened with chronic features. The cardiopericardial silhouette is enlarged.  IMPRESSION: No substantial change.  Cardiomegaly with persistent airspace opacity involving the left mid and lower lung.   Original Report Authenticated By: ERIC A. MANSELL, M.D.     ASSESSMENT: This is a very pleasant 78 years old white male with history of myelodysplastic syndrome in addition to chronic renal insufficiency and anemia of chronic disease. The patient is currently on Aranesp injection under the care of Dr. Caryn Section. His absolute neutrophil count is down today but hemoglobin-hematocrit and platelets count are better than during his hospitalization.Marland Kitchen  PLAN: I recommended for the patient to proceed with Neupogen injections 300 mcg subcutaneously for the next 3 days to improve his neutrophil count. He would continue observation  with repeat CBC and LDH in one month. He was advised to call immediately if he has any bleeding issues, significant fatigue or weakness in the interval. Chemotherapy with Vidaza was discussed with the patient in the past and he declined that option which is very reasonable taken into consideration his age and comorbidities. All questions were answered. The patient knows to call the clinic with any problems, questions or concerns. We can certainly see the patient much sooner if necessary.

## 2012-01-12 NOTE — Telephone Encounter (Signed)
gv and printed appt schedule for pt for Oct and NOv

## 2012-01-13 ENCOUNTER — Ambulatory Visit (HOSPITAL_BASED_OUTPATIENT_CLINIC_OR_DEPARTMENT_OTHER): Payer: Medicare Other

## 2012-01-13 ENCOUNTER — Encounter (HOSPITAL_COMMUNITY)
Admission: RE | Admit: 2012-01-13 | Discharge: 2012-01-13 | Disposition: A | Discharge status: Home / Self Care | Payer: Medicare Other | Source: Ambulatory Visit | Attending: Pulmonary Disease | Admitting: Pulmonary Disease

## 2012-01-13 ENCOUNTER — Encounter (HOSPITAL_COMMUNITY): Payer: Self-pay | Admitting: Pharmacy Technician

## 2012-01-13 VITALS — BP 145/94 | HR 81 | Temp 97.6°F

## 2012-01-13 DIAGNOSIS — D469 Myelodysplastic syndrome, unspecified: Secondary | ICD-10-CM

## 2012-01-13 MED ORDER — FILGRASTIM 300 MCG/0.5ML IJ SOLN
300.0000 ug | Freq: Once | INTRAMUSCULAR | Status: AC
  Administered 2012-01-13: 300 ug via SUBCUTANEOUS
  Filled 2012-01-13: qty 0.5

## 2012-01-13 NOTE — Progress Notes (Signed)
Tracy [name] 78 y.o. male   Nutrition Note Spoke with pt. Pt is at a normal weight. Per pt, his UBW was 209# before his MI 01/2011. Pt states he lost 20# over 8 months. Pt eats 3 meals and drinks 2 Ensure Complete supplements daily. Pt is also taking Advocare Muscle Gain, which contains whey protein. Pt states his MD said the Advocare Muscle Gain "was fine." Pt started Ensure supplements due to decreased appetite.  Most meals prepared at home by pt's wife. Pt reports his appetite is decreased "because my wife is doing the cooking." Pt was a Investment banker, operational and used to enjoy cooking.  There are some ways the pt can make his eating habits healthier.  Pt's Rate Your Plate results reviewed with pt. Age-appropriate dietary recommendations discussed. Pt expressed understanding.  Pt avoids many salty foods; "seldom" uses canned/ convenience food.  Pt makes his own lower-sodium seasoning salt and uses that to season many foods.  The role of sodium in lung disease reviewed with pt.   Nutrition Diagnosis   Food-and nutrition-related knowledge deficit related to lack of exposure to information as related to diagnosis of pulmonary disease Nutrition Rx/Est. Daily Nutrition Needs for: ? wt maintenance 2300-2450 Kcal  100-120 gm protein   1500-2000 mg or less sodium      Nutrition Intervention   Pt's individual nutrition plan and goals reviewed with pt.   Benefits of adopting healthy eating habits discussed when pt's Rate Your Plate reviewed.   Pt to attend the Nutrition and Lung Disease class   Continual client-centered nutrition education by RD, as part of interdisciplinary care. Goal(s) 1. The pt will recognize symptoms that can interfere with adequate oral intake, such as shortness of breath, early satiety, fatigue, taste and smell changes, chewing/swallowing difficulties when eating. 2. The pt will consume high-energy, high-nutrient dense beverages when necessary to compensate for decreased oral intake of solid  foods. Monitor and Evaluate progress toward nutrition goal with team.          Pulmonary Rehab Nutrition Screen  Please answer the following questions:             YES  NO    Do you live in a nursing home?  X   Do you eat out more than 3 times per week?    X If yes, how many times per week do you eat out?   Do you have food allergies?   X If yes, what are you allergic to?  Have you gained or lost more than 10 lbs without trying?              X  If yes, how much weight have you  lost? 20 lbs over 8 mo   Do you want to lose weight?     X If yes, what is a goal weight or amount of weight you would like to lose? lbs  Do you eat alone most of the time?   X   Do you eat less than 2 meals/day?  X If yes, how many meals do you eat?  Do you use canned and convenience food?  X   Do you use a salt shaker? X    Do you drink more than 3 alcoholic drinks/day?  X If yes, how many drinks per day?  Are you having trouble with constipation? *  X If yes, what are you doing to help relieve constipation?  Do you have financial difficulties with buying food? *  X   Do you usually need help with grocery shopping or with cooking? *  X   Do you have a poor appetite? *                                      X    Do you have trouble chewing/ swallowing? *   X   Do you take vitamin and mineral or herbal supplements? *  X If yes, what kind of supplements do you currently take?    Ht: 71.75" Ht Readings from Last 1 Encounters:  01/12/12 6\' 1"  (1.854 m)    Wt:   182.4 lb (82.9 kg) Wt Readings from Last 3 Encounters:  01/12/12 186 lb (84.369 kg)  01/11/12 188 lb (85.276 kg)  01/01/12 185 lb 4.8 oz (84.052 kg)    BMI: 25.0  34.7%body fat                       Rate Your Plate Score: 50  Diagnosis: Pulmonary Fibrosis Past Medical History  Diagnosis Date  . CAD (coronary artery disease)     status post prior PCI to the obtuse marginal in 1997 and stenting to the mid to distal RCA in 2001; LHC 9/06:  LM ok, pLAD  50%, mLAD 60% then 60-70%, mRI 50%, mOM1 80-90% (small), pRCA and dRCA stents ok, PDA 50%, EF 65%;  Myoview 5/12: No ischemia, EF 64%.;  NSTEMI 11/12 tx medically   . Chronic kidney disease, stage III (moderate)   . TIA (transient ischemic attack)   . HTN (hypertension)   . Hyperlipidemia   . AF (atrial fibrillation) 02/16/11    Coumadin ==> patient decided to stop after admxn with worsening anemia in setting of UGI bleed, epistaxis  . Arthritis     "in my left ankle"  . Chronic diastolic heart failure   . Anemia of chronic disease   . Carotid stenosis     dopplers 03/2011: 0-39% bilateral  . MDS (myelodysplastic syndrome) 12/16/2011  . Stroke     5 yrs ago.  Mini   . Hx of echocardiogram     Echocardiogram 02/17/11: Severe LVH, asymmetric hypertrophy, EF 50-55%, normal wall motion, high ventricular filling pressure, mild LAE, mild RVE, mildly reduced RVSF, mild RAE.     Meds reviewed    Wt goal: 182 lb ( 82.9 kg) Current tobacco use? No Food/Drug Interaction? No Labs:  Lipid Panel     Component Value Date/Time   CHOL 144 02/17/2011 0730   TRIG 88 02/17/2011 0730   HDL 59 02/17/2011 0730   CHOLHDL 2.4 02/17/2011 0730   VLDL 18 02/17/2011 0730   LDLCALC 67 02/17/2011 0730    No results found for this basename: HGBA1C    Nutrition Rx/Est. Daily Nutrition Needs for: ? wt maintenance 2300-2450 Kcal  100-120 gm protein   1500 mg or less sodium       Nutrition Risk Level:  Moderate

## 2012-01-14 ENCOUNTER — Encounter (HOSPITAL_COMMUNITY)
Admission: RE | Admit: 2012-01-14 | Discharge: 2012-01-14 | Disposition: A | Discharge status: Home / Self Care | Payer: Medicare Other | Source: Ambulatory Visit | Attending: Nephrology | Admitting: Nephrology

## 2012-01-14 ENCOUNTER — Ambulatory Visit (HOSPITAL_BASED_OUTPATIENT_CLINIC_OR_DEPARTMENT_OTHER): Payer: Medicare Other

## 2012-01-14 VITALS — BP 133/85 | HR 103 | Temp 96.7°F

## 2012-01-14 DIAGNOSIS — D469 Myelodysplastic syndrome, unspecified: Secondary | ICD-10-CM

## 2012-01-14 MED ORDER — FILGRASTIM 300 MCG/0.5ML IJ SOLN
300.0000 ug | Freq: Once | INTRAMUSCULAR | Status: AC
  Administered 2012-01-14: 300 ug via SUBCUTANEOUS
  Filled 2012-01-14: qty 0.5

## 2012-01-14 MED ORDER — DARBEPOETIN ALFA-POLYSORBATE 200 MCG/0.4ML IJ SOLN
INTRAMUSCULAR | Status: AC
  Filled 2012-01-14: qty 0.4

## 2012-01-14 MED ORDER — DARBEPOETIN ALFA-POLYSORBATE 200 MCG/0.4ML IJ SOLN
200.0000 ug | INTRAMUSCULAR | Status: DC
  Administered 2012-01-14: 200 ug via SUBCUTANEOUS

## 2012-01-17 ENCOUNTER — Ambulatory Visit: Payer: Self-pay | Admitting: Cardiology

## 2012-01-17 DIAGNOSIS — Z7901 Long term (current) use of anticoagulants: Secondary | ICD-10-CM

## 2012-01-17 DIAGNOSIS — I4891 Unspecified atrial fibrillation: Secondary | ICD-10-CM

## 2012-01-18 ENCOUNTER — Encounter (HOSPITAL_COMMUNITY)
Admission: RE | Admit: 2012-01-18 | Discharge: 2012-01-18 | Disposition: A | Discharge status: Home / Self Care | Payer: Medicare Other | Source: Ambulatory Visit | Attending: Pulmonary Disease | Admitting: Pulmonary Disease

## 2012-01-18 NOTE — Progress Notes (Signed)
Tracy [name] came in today for Pulm rehab.  He attended the 30 minute education class, he requested to not exercise today due to having other health issues which has made him very uncomfortable.  He has informed us he will be having prostate surgery on Friday.

## 2012-01-19 ENCOUNTER — Encounter (HOSPITAL_COMMUNITY): Payer: Self-pay

## 2012-01-19 ENCOUNTER — Ambulatory Visit (HOSPITAL_COMMUNITY)
Admission: RE | Admit: 2012-01-19 | Discharge: 2012-01-19 | Disposition: A | Discharge status: Home / Self Care | Payer: Medicare Other | Source: Ambulatory Visit | Attending: Urology | Admitting: Urology

## 2012-01-19 ENCOUNTER — Other Ambulatory Visit: Payer: Self-pay | Admitting: Urology

## 2012-01-19 ENCOUNTER — Encounter (HOSPITAL_COMMUNITY)
Admission: RE | Admit: 2012-01-19 | Discharge: 2012-01-19 | Disposition: A | Discharge status: Home / Self Care | Payer: Medicare Other | Source: Ambulatory Visit | Attending: Urology | Admitting: Urology

## 2012-01-19 DIAGNOSIS — J90 Pleural effusion, not elsewhere classified: Secondary | ICD-10-CM | POA: Insufficient documentation

## 2012-01-19 DIAGNOSIS — Z01818 Encounter for other preprocedural examination: Secondary | ICD-10-CM | POA: Insufficient documentation

## 2012-01-19 DIAGNOSIS — I10 Essential (primary) hypertension: Secondary | ICD-10-CM | POA: Insufficient documentation

## 2012-01-19 DIAGNOSIS — Z01812 Encounter for preprocedural laboratory examination: Secondary | ICD-10-CM | POA: Insufficient documentation

## 2012-01-19 DIAGNOSIS — I517 Cardiomegaly: Secondary | ICD-10-CM | POA: Insufficient documentation

## 2012-01-19 DIAGNOSIS — I2510 Atherosclerotic heart disease of native coronary artery without angina pectoris: Secondary | ICD-10-CM | POA: Insufficient documentation

## 2012-01-19 HISTORY — DX: Acute myocardial infarction, unspecified: I21.9

## 2012-01-19 HISTORY — DX: Heart failure, unspecified: I50.9

## 2012-01-19 LAB — SURGICAL PCR SCREEN
MRSA, PCR: NEGATIVE
Staphylococcus aureus: NEGATIVE

## 2012-01-19 LAB — CBC
HCT: 30.3 % — ABNORMAL LOW (ref 39.0–52.0)
Hemoglobin: 9.5 g/dL — ABNORMAL LOW (ref 13.0–17.0)
MCH: 30.5 pg (ref 26.0–34.0)
MCHC: 31.4 g/dL (ref 30.0–36.0)
MCV: 97.4 fL (ref 78.0–100.0)
Platelets: 48 10*3/uL — ABNORMAL LOW (ref 150–400)
RBC: 3.11 MIL/uL — ABNORMAL LOW (ref 4.22–5.81)
RDW: 20.9 % — ABNORMAL HIGH (ref 11.5–15.5)
WBC: 5.3 10*3/uL (ref 4.0–10.5)

## 2012-01-19 LAB — BASIC METABOLIC PANEL
BUN: 38 mg/dL — ABNORMAL HIGH (ref 6–23)
CO2: 19 mEq/L (ref 19–32)
Calcium: 9 mg/dL (ref 8.4–10.5)
Chloride: 104 mEq/L (ref 96–112)
Creatinine, Ser: 2.85 mg/dL — ABNORMAL HIGH (ref 0.50–1.35)
GFR calc Af Amer: 21 mL/min — ABNORMAL LOW (ref >90)
GFR calc non Af Amer: 18 mL/min — ABNORMAL LOW (ref >90)
Glucose, Bld: 120 mg/dL — ABNORMAL HIGH (ref 70–99)
Potassium: 5.5 mEq/L — ABNORMAL HIGH (ref 3.5–5.1)
Sodium: 134 mEq/L — ABNORMAL LOW (ref 135–145)

## 2012-01-19 LAB — PROTIME-INR
INR: 1.27 (ref 0.00–1.49)
Prothrombin Time: 15.6 seconds — ABNORMAL HIGH (ref 11.6–15.2)

## 2012-01-19 NOTE — Progress Notes (Signed)
01/19/12 1406  OBSTRUCTIVE SLEEP APNEA  Have you ever been diagnosed with sleep apnea through a sleep study? No  Do you snore loudly (loud enough to be heard through closed doors)?  0  Do you often feel tired, fatigued, or sleepy during the daytime? 1  Has anyone observed you stop breathing during your sleep? 0  Do you have, or are you being treated for high blood pressure? 1  BMI more than 35 kg/m2? 0  Age over 76 years old? 1  Neck circumference greater than 40 cm/18 inches? 0  Gender: 1  Obstructive Sleep Apnea Score 4   Score 4 or greater  Results sent to PCP

## 2012-01-19 NOTE — Patient Instructions (Addendum)
YOUR SURGERY IS SCHEDULED AT [facility]  ON:  Friday  10/25  AT 12:45 PM  REPORT TO [place] SHORT STAY CENTER AT:  10:45 AM      PHONE # FOR SHORT STAY IS [phone]  DO NOT EAT ANYTHING AFTER MIDNIGHT THE NIGHT BEFORE YOUR SURGERY.   NO FOOD, NO CHEWING GUM, NO MINTS, NO CANDIES, NO CHEWING TOBACCO. YOU MAY HAVE CLEAR LIQUIDS TO DRINK FROM MIDNIGHT THE NIGHT BEFORE SURGERY --UNTIL 6:30 AM DAY OF SURGERY --LIKE WATER, HOT TEA ( NO MILK OR MILK PRODUCTS IN TEA)                             NOTHING TO DRINK AFTER 6:30 AM DAY OF YOUR SURGERY.  PLEASE TAKE THE FOLLOWING MEDICATIONS THE AM OF YOUR SURGERY WITH A FEW SIPS OF WATER:    METOPROLOL      BRING YOUR NITROGLYCERIN   IF YOU USE INHALERS--USE YOUR INHALERS THE AM OF YOUR SURGERY AND BRING INHALERS TO THE HOSPITAL -TAKE TO SURGERY.    IF YOU ARE DIABETIC:  DO NOT TAKE ANY DIABETIC MEDICATIONS THE AM OF YOUR SURGERY.  IF YOU TAKE INSULIN IN THE EVENINGS--PLEASE ONLY TAKE 1/2 NORMAL EVENING DOSE THE NIGHT BEFORE YOUR SURGERY.  NO INSULIN THE AM OF YOUR SURGERY.  IF YOU HAVE SLEEP APNEA AND USE CPAP OR BIPAP--PLEASE BRING THE MASK AND THE TUBING.  DO NOT BRING YOUR MACHINE.  DO NOT BRING VALUABLES, MONEY, CREDIT CARDS.  DO NOT WEAR JEWELRY, MAKE-UP, NAIL POLISH AND NO METAL PINS OR CLIPS IN YOUR HAIR. CONTACT LENS, DENTURES / PARTIALS, GLASSES SHOULD NOT BE WORN TO SURGERY AND IN MOST CASES-HEARING AIDS WILL NEED TO BE REMOVED.  BRING YOUR GLASSES CASE, ANY EQUIPMENT NEEDED FOR YOUR CONTACT LENS. FOR PATIENTS ADMITTED TO THE HOSPITAL--CHECK OUT TIME THE DAY OF DISCHARGE IS 11:00 AM.  ALL INPATIENT ROOMS ARE PRIVATE - WITH BATHROOM, TELEPHONE, TELEVISION AND WIFI INTERNET.  IF YOU ARE BEING DISCHARGED THE SAME DAY OF YOUR SURGERY--YOU CAN NOT DRIVE YOURSELF HOME--AND SHOULD NOT GO HOME ALONE BY TAXI OR BUS.  NO DRIVING OR OPERATING MACHINERY FOR 24 HOURS FOLLOWING ANESTHESIA / PAIN MEDICATIONS.  PLEASE MAKE ARRANGEMENTS FOR SOMEONE  TO BE WITH YOU AT HOME THE FIRST 24 HOURS AFTER SURGERY. RESPONSIBLE DRIVER'S NAME___________________________                                               PHONE #   _______________________                                  PLEASE READ OVER ANY  FACT SHEETS THAT YOU WERE GIVEN: MRSA INFORMATION, BLOOD TRANSFUSION INFORMATION, INCENTIVE SPIROMETER INFORMATION.

## 2012-01-19 NOTE — Pre-Procedure Instructions (Signed)
CBC, BMET, PT, CXR WERE DONE TODAY PREOP AT [facility] AS PER ANESTHESIOLOGIST'S GUIDELINES. PT HAS AND EKG REPORT 01/11/12 AND ECHO REPORT 12/26/11 IN EPIC AND CARDIOLOGIST CLEARANCE FOR SURGERY FROM DR. HOCHREIN -NOTE ON PT'S CHART.

## 2012-01-20 ENCOUNTER — Encounter (HOSPITAL_COMMUNITY): Payer: Medicare Other

## 2012-01-20 ENCOUNTER — Encounter (HOSPITAL_COMMUNITY)
Admission: RE | Admit: 2012-01-20 | Discharge: 2012-01-20 | Disposition: A | Discharge status: Home / Self Care | Payer: Medicare Other | Source: Ambulatory Visit | Attending: Nephrology | Admitting: Nephrology

## 2012-01-20 LAB — POCT HEMOGLOBIN-HEMACUE: Hemoglobin: 8.9 g/dL — ABNORMAL LOW (ref 13.0–17.0)

## 2012-01-20 MED ORDER — DARBEPOETIN ALFA-POLYSORBATE 200 MCG/0.4ML IJ SOLN
INTRAMUSCULAR | Status: AC
  Filled 2012-01-20: qty 0.4

## 2012-01-20 MED ORDER — DARBEPOETIN ALFA-POLYSORBATE 200 MCG/0.4ML IJ SOLN
200.0000 ug | INTRAMUSCULAR | Status: DC
  Administered 2012-01-20: 200 ug via SUBCUTANEOUS
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
## 2012-01-20 NOTE — Pre-Procedure Instructions (Signed)
PT'S PREOP CXR REPORT, CBC, BMET, PT REPORTS WERE FAXED TO DR. Margrett Rud OFFICE YESTERDAY  01/19/12.  I SPOKE WITH PAM-DR. OTTELIN'S SURGERY SCHEDULER TODAY--SHE STATES DR. Vernie Ammons DID SEE THE LABS, CXR AND HAS ORDERED REPEAT CXR DAY OF SURGERY.  I NOTIFIED DR. ROSE OF PT'S SURGERY TOMORROW--ABNORMAL CXR AND THE ORDER TO REPEAT DAY OF SURGERY--ALSO NOTIFIED DR. ROSE PT HAS MYELOPLASTIC SYNDROME AND RECENT BLOOD TRANSFUSIONS AND PREOP HGB 9.5 AND ORDER RECEIVED AND PLACED IN EPIC TO DO T/S AM OF SURGERY.

## 2012-01-21 ENCOUNTER — Ambulatory Visit (HOSPITAL_COMMUNITY): Payer: Medicare Other

## 2012-01-21 ENCOUNTER — Inpatient Hospital Stay (HOSPITAL_COMMUNITY)
Admission: RE | Admit: 2012-01-21 | Discharge: 2012-02-11 | DRG: 713 | Disposition: A | Discharge status: Home / Self Care | Payer: Medicare Other | Source: Ambulatory Visit | Attending: Urology | Admitting: Urology

## 2012-01-21 ENCOUNTER — Encounter (HOSPITAL_COMMUNITY): Payer: Self-pay | Admitting: Anesthesiology

## 2012-01-21 ENCOUNTER — Ambulatory Visit (HOSPITAL_COMMUNITY): Payer: Medicare Other | Admitting: Anesthesiology

## 2012-01-21 ENCOUNTER — Encounter (HOSPITAL_COMMUNITY): Payer: Self-pay | Admitting: *Deleted

## 2012-01-21 ENCOUNTER — Encounter (HOSPITAL_COMMUNITY)
Admission: RE | Disposition: A | Discharge status: Home / Self Care | Payer: Self-pay | Source: Ambulatory Visit | Attending: Urology

## 2012-01-21 DIAGNOSIS — R5381 Other malaise: Secondary | ICD-10-CM | POA: Diagnosis present

## 2012-01-21 DIAGNOSIS — N401 Enlarged prostate with lower urinary tract symptoms: Principal | ICD-10-CM | POA: Diagnosis present

## 2012-01-21 DIAGNOSIS — R339 Retention of urine, unspecified: Secondary | ICD-10-CM | POA: Diagnosis present

## 2012-01-21 DIAGNOSIS — N2581 Secondary hyperparathyroidism of renal origin: Secondary | ICD-10-CM | POA: Diagnosis present

## 2012-01-21 DIAGNOSIS — I4891 Unspecified atrial fibrillation: Secondary | ICD-10-CM | POA: Diagnosis present

## 2012-01-21 DIAGNOSIS — D696 Thrombocytopenia, unspecified: Secondary | ICD-10-CM | POA: Diagnosis present

## 2012-01-21 DIAGNOSIS — I2510 Atherosclerotic heart disease of native coronary artery without angina pectoris: Secondary | ICD-10-CM | POA: Diagnosis present

## 2012-01-21 DIAGNOSIS — N179 Acute kidney failure, unspecified: Secondary | ICD-10-CM | POA: Diagnosis not present

## 2012-01-21 DIAGNOSIS — E785 Hyperlipidemia, unspecified: Secondary | ICD-10-CM | POA: Diagnosis present

## 2012-01-21 DIAGNOSIS — I5032 Chronic diastolic (congestive) heart failure: Secondary | ICD-10-CM

## 2012-01-21 DIAGNOSIS — Z8673 Personal history of transient ischemic attack (TIA), and cerebral infarction without residual deficits: Secondary | ICD-10-CM

## 2012-01-21 DIAGNOSIS — D469 Myelodysplastic syndrome, unspecified: Secondary | ICD-10-CM | POA: Diagnosis present

## 2012-01-21 DIAGNOSIS — N184 Chronic kidney disease, stage 4 (severe): Secondary | ICD-10-CM | POA: Diagnosis present

## 2012-01-21 DIAGNOSIS — D62 Acute posthemorrhagic anemia: Secondary | ICD-10-CM | POA: Diagnosis not present

## 2012-01-21 DIAGNOSIS — Z7901 Long term (current) use of anticoagulants: Secondary | ICD-10-CM

## 2012-01-21 DIAGNOSIS — R55 Syncope and collapse: Secondary | ICD-10-CM

## 2012-01-21 DIAGNOSIS — I509 Heart failure, unspecified: Secondary | ICD-10-CM | POA: Diagnosis present

## 2012-01-21 DIAGNOSIS — E875 Hyperkalemia: Secondary | ICD-10-CM | POA: Diagnosis present

## 2012-01-21 DIAGNOSIS — Z79899 Other long term (current) drug therapy: Secondary | ICD-10-CM

## 2012-01-21 DIAGNOSIS — R040 Epistaxis: Secondary | ICD-10-CM

## 2012-01-21 DIAGNOSIS — N189 Chronic kidney disease, unspecified: Secondary | ICD-10-CM

## 2012-01-21 DIAGNOSIS — I120 Hypertensive chronic kidney disease with stage 5 chronic kidney disease or end stage renal disease: Secondary | ICD-10-CM | POA: Diagnosis present

## 2012-01-21 DIAGNOSIS — R7989 Other specified abnormal findings of blood chemistry: Secondary | ICD-10-CM

## 2012-01-21 DIAGNOSIS — N186 End stage renal disease: Secondary | ICD-10-CM | POA: Diagnosis present

## 2012-01-21 DIAGNOSIS — N138 Other obstructive and reflux uropathy: Principal | ICD-10-CM | POA: Diagnosis present

## 2012-01-21 DIAGNOSIS — I10 Essential (primary) hypertension: Secondary | ICD-10-CM | POA: Diagnosis present

## 2012-01-21 DIAGNOSIS — I252 Old myocardial infarction: Secondary | ICD-10-CM

## 2012-01-21 DIAGNOSIS — R112 Nausea with vomiting, unspecified: Secondary | ICD-10-CM | POA: Diagnosis present

## 2012-01-21 DIAGNOSIS — I5042 Chronic combined systolic (congestive) and diastolic (congestive) heart failure: Secondary | ICD-10-CM | POA: Diagnosis present

## 2012-01-21 DIAGNOSIS — R319 Hematuria, unspecified: Secondary | ICD-10-CM

## 2012-01-21 HISTORY — PX: TRANSURETHRAL RESECTION OF PROSTATE: SHX73

## 2012-01-21 HISTORY — DX: Hydrocele, unspecified: N43.3

## 2012-01-21 LAB — ABO/RH: ABO/RH(D): O NEG

## 2012-01-21 SURGERY — TRANSURETHRAL RESECTION OF THE PROSTATE WITH GYRUS INSTRUMENTS
Anesthesia: General | Wound class: Clean Contaminated

## 2012-01-21 MED ORDER — SODIUM CHLORIDE 0.9 % IR SOLN
Status: DC | PRN
  Administered 2012-01-21: 18000 mL

## 2012-01-21 MED ORDER — SODIUM CHLORIDE 0.9 % IV SOLN
INTRAVENOUS | Status: DC
  Administered 2012-01-21: 100 mL/h via INTRAVENOUS
  Administered 2012-01-22: 23:00:00 via INTRAVENOUS
  Administered 2012-01-22: 1000 mL via INTRAVENOUS
  Administered 2012-01-23: 16:00:00 via INTRAVENOUS
  Administered 2012-01-24: 10 mL/h via INTRAVENOUS
  Administered 2012-01-24 – 2012-02-02 (×4): via INTRAVENOUS

## 2012-01-21 MED ORDER — PROMETHAZINE HCL 25 MG/ML IJ SOLN: 6.2500 mg | INTRAMUSCULAR | Status: DC | PRN

## 2012-01-21 MED ORDER — VANCOMYCIN HCL IN DEXTROSE 1-5 GM/200ML-% IV SOLN
1000.0000 mg | Freq: Once | INTRAVENOUS | Status: AC
  Administered 2012-01-21: 1000 mg via INTRAVENOUS

## 2012-01-21 MED ORDER — FENTANYL CITRATE 0.05 MG/ML IJ SOLN
INTRAMUSCULAR | Status: DC | PRN
  Administered 2012-01-21 (×4): 25 ug via INTRAVENOUS

## 2012-01-21 MED ORDER — ONDANSETRON HCL 4 MG/2ML IJ SOLN
4.0000 mg | INTRAMUSCULAR | Status: DC | PRN
  Administered 2012-02-01 – 2012-02-11 (×11): 4 mg via INTRAVENOUS
  Filled 2012-01-21 (×11): qty 2

## 2012-01-21 MED ORDER — LIDOCAINE HCL 1 % IJ SOLN
INTRAMUSCULAR | Status: DC | PRN
  Administered 2012-01-21: 100 mg via INTRADERMAL

## 2012-01-21 MED ORDER — HYDROMORPHONE HCL PF 1 MG/ML IJ SOLN: 0.2500 mg | INTRAMUSCULAR | Status: DC | PRN

## 2012-01-21 MED ORDER — ACETAMINOPHEN 10 MG/ML IV SOLN: 1000.0000 mg | Freq: Once | INTRAVENOUS | Status: DC | PRN

## 2012-01-21 MED ORDER — PROPOFOL 10 MG/ML IV BOLUS
INTRAVENOUS | Status: DC | PRN
  Administered 2012-01-21: 120 mg via INTRAVENOUS
  Administered 2012-01-21: 40 mg via INTRAVENOUS

## 2012-01-21 MED ORDER — ACETAMINOPHEN 325 MG PO TABS
650.0000 mg | ORAL_TABLET | ORAL | Status: DC | PRN
  Administered 2012-01-22: 650 mg via ORAL
  Filled 2012-01-21: qty 2

## 2012-01-21 MED ORDER — BACITRACIN-NEOMYCIN-POLYMYXIN 400-5-5000 EX OINT
1.0000 "application " | TOPICAL_OINTMENT | Freq: Three times a day (TID) | CUTANEOUS | Status: DC | PRN
  Filled 2012-01-21: qty 1

## 2012-01-21 MED ORDER — HYDROCODONE-ACETAMINOPHEN 5-325 MG PO TABS
1.0000 | ORAL_TABLET | ORAL | Status: DC | PRN
  Administered 2012-01-21: 1 via ORAL
  Administered 2012-01-22: 2 via ORAL
  Administered 2012-01-22: 1 via ORAL
  Administered 2012-01-23 – 2012-02-08 (×18): 2 via ORAL
  Administered 2012-02-08 – 2012-02-09 (×2): 1 via ORAL
  Administered 2012-02-09 – 2012-02-10 (×2): 2 via ORAL
  Filled 2012-01-21 (×5): qty 2
  Filled 2012-01-21: qty 1
  Filled 2012-01-21 (×11): qty 2
  Filled 2012-01-21: qty 1
  Filled 2012-01-21 (×4): qty 2
  Filled 2012-01-21: qty 1
  Filled 2012-01-21 (×2): qty 2

## 2012-01-21 MED ORDER — SODIUM CHLORIDE 0.9 % IV SOLN
INTRAVENOUS | Status: DC
  Administered 2012-01-21: 1000 mL via INTRAVENOUS

## 2012-01-21 MED ORDER — EPHEDRINE SULFATE 50 MG/ML IJ SOLN
INTRAMUSCULAR | Status: DC | PRN
  Administered 2012-01-21 (×2): 5 mg via INTRAVENOUS

## 2012-01-21 MED ORDER — MEPERIDINE HCL 50 MG/ML IJ SOLN: 6.2500 mg | INTRAMUSCULAR | Status: DC | PRN

## 2012-01-21 MED ORDER — VANCOMYCIN HCL IN DEXTROSE 1-5 GM/200ML-% IV SOLN
INTRAVENOUS | Status: AC
  Filled 2012-01-21: qty 200

## 2012-01-21 MED ORDER — VANCOMYCIN HCL 500 MG IV SOLR
500.0000 mg | INTRAVENOUS | Status: DC
  Filled 2012-01-21: qty 500

## 2012-01-21 MED ORDER — CIPROFLOXACIN IN D5W 400 MG/200ML IV SOLN
INTRAVENOUS | Status: AC
  Filled 2012-01-21: qty 200

## 2012-01-21 MED ORDER — TRAMADOL HCL 50 MG PO TABS: 50.0000 mg | ORAL_TABLET | Freq: Four times a day (QID) | ORAL | Status: DC | PRN

## 2012-01-21 MED ORDER — CIPROFLOXACIN IN D5W 400 MG/200ML IV SOLN
400.0000 mg | INTRAVENOUS | Status: AC
  Administered 2012-01-21: 400 mg via INTRAVENOUS

## 2012-01-21 MED ORDER — LACTATED RINGERS IV SOLN
INTRAVENOUS | Status: DC
  Administered 2012-01-21: 1000 mL via INTRAVENOUS

## 2012-01-21 MED ORDER — CIPROFLOXACIN IN D5W 200 MG/100ML IV SOLN
200.0000 mg | Freq: Two times a day (BID) | INTRAVENOUS | Status: DC
  Filled 2012-01-21: qty 100

## 2012-01-21 SURGICAL SUPPLY — 26 items
BAG URINE DRAINAGE (UROLOGICAL SUPPLIES) IMPLANT
BAG URO CATCHER STRL LF (DRAPE) ×2 IMPLANT
BLADE SURG 15 STRL LF DISP TIS (BLADE) IMPLANT
BLADE SURG 15 STRL SS (BLADE)
CATH FOLEY 3WAY 30CC 22FR (CATHETERS) ×1 IMPLANT
CATH FOLEY 3WAY 30CC 24FR (CATHETERS)
CATH URTH STD 24FR FL 3W 2 (CATHETERS) ×1 IMPLANT
CLOTH BEACON ORANGE TIMEOUT ST (SAFETY) ×2 IMPLANT
DRAPE CAMERA CLOSED 9X96 (DRAPES) ×2 IMPLANT
ELECT LOOP MED HF 24F 12D CBL (CLIP) ×1 IMPLANT
ELECT REM PT RETURN 9FT ADLT (ELECTROSURGICAL) ×2
ELECTRODE REM PT RTRN 9FT ADLT (ELECTROSURGICAL) ×1 IMPLANT
EVACUATOR MICROVAS BLADDER (UROLOGICAL SUPPLIES) ×1 IMPLANT
GLOVE BIOGEL M 8.0 STRL (GLOVE) ×2 IMPLANT
GOWN PREVENTION PLUS XLARGE (GOWN DISPOSABLE) ×2 IMPLANT
GOWN STRL REIN XL XLG (GOWN DISPOSABLE) ×2 IMPLANT
JUMPSUIT BLUE BOOT COVER DISP (PROTECTIVE WEAR) IMPLANT
KIT ASPIRATION TUBING (SET/KITS/TRAYS/PACK) ×2 IMPLANT
LOOPS RESECTOSCOPE DISP (ELECTROSURGICAL) ×1 IMPLANT
MANIFOLD NEPTUNE II (INSTRUMENTS) ×2 IMPLANT
NS IRRIG 1000ML POUR BTL (IV SOLUTION) ×2 IMPLANT
PACK CYSTO (CUSTOM PROCEDURE TRAY) ×2 IMPLANT
SUT ETHILON 3 0 PS 1 (SUTURE) IMPLANT
SYR 30ML LL (SYRINGE) IMPLANT
TUBING CONNECTING 10 (TUBING) ×2 IMPLANT
WIRE COONS/BENSON .038X145CM (WIRE) IMPLANT

## 2012-01-21 NOTE — Anesthesia Postprocedure Evaluation (Signed)
Anesthesia Post Note  Patient: Tracy [name]  Procedure(s) Performed: Procedure(s) (LRB): TRANSURETHRAL RESECTION OF THE PROSTATE WITH GYRUS INSTRUMENTS (N/A)  Anesthesia type: General  Patient location: PACU  Post pain: Pain level controlled  Post assessment: Post-op Vital signs reviewed  Last Vitals: BP 146/91  Pulse 70  Temp 36.4 C  Resp 18  SpO2 96%  Post vital signs: Reviewed  Level of consciousness: sedated  Complications: No apparent anesthesia complications

## 2012-01-21 NOTE — Anesthesia Preprocedure Evaluation (Addendum)
Anesthesia Evaluation  Patient identified by MRN, date of birth, ID band Patient awake    Reviewed: Allergy & Precautions, H&P , NPO status , Patient's Chart, lab work & pertinent test results, reviewed documented beta blocker date and time   Airway Mallampati: III TM Distance: >3 FB Neck ROM: Full    Dental  (+) Dental Advisory Given, Poor Dentition, Partial Upper, Partial Lower, Missing and Chipped   Pulmonary shortness of breath and with exertion,  breath sounds clear to auscultation  Pulmonary exam normal       Cardiovascular hypertension, Pt. on medications and Pt. on home beta blockers + CAD, + Past MI, + Cardiac Stents and +CHF + dysrhythmias Atrial Fibrillation + Valvular Problems/Murmurs MR and AI Rhythm:Irregular Rate:Normal  Study Conclusions  - Left ventricle: Mid and basal inferior wall hypokinesis   The cavity size was mildly dilated. Wall thickness was   increased in a pattern of moderate LVH. The estimated   ejection fraction was 50%. - Aortic valve: Calcified noncoronary cusp Mild   regurgitation. - Mitral valve: Moderate regurgitation. - Left atrium: The atrium was moderately dilated. - Right ventricle: The cavity size was moderately dilated. - Right atrium: The atrium was mildly dilated. - Pericardium, extracardiac: A trivial pericardial effusion   was identified.    Neuro/Psych TIAnegative psych ROS   GI/Hepatic negative GI ROS, Neg liver ROS,   Endo/Other  negative endocrine ROS  Renal/GU CRF and Renal InsufficiencyRenal disease     Musculoskeletal negative musculoskeletal ROS (+)   Abdominal   Peds  Hematology negative hematology ROS (+)   Anesthesia Other Findings   Reproductive/Obstetrics                      Anesthesia Physical Anesthesia Plan  ASA: IV  Anesthesia Plan: General   Post-op Pain Management:    Induction: Intravenous  Airway Management Planned:  Oral ETT  Additional Equipment:   Intra-op Plan:   Post-operative Plan: Extubation in OR  Informed Consent: I have reviewed the patients History and Physical, chart, labs and discussed the procedure including the risks, benefits and alternatives for the proposed anesthesia with the patient or authorized representative who has indicated his/her understanding and acceptance.   Dental advisory given  Plan Discussed with: CRNA  Anesthesia Plan Comments:       Anesthesia Quick Evaluation

## 2012-01-21 NOTE — Preoperative (Signed)
Beta Blockers   Reason not to administer Beta Blockers:Not Applicable 

## 2012-01-21 NOTE — Progress Notes (Signed)
Patient ID: Tracy [name], male   DOB: Jan 22, 1920, 78 y.o.   MRN: [id number] Mr. Pak is doing well postoperatively. He is awake and alert. He has no complaints of pain.  His Foley catheter was clotted but with irrigation a small clot was irrigated free and catheter began draining properly. He is on CBI. His abdomen is soft and nondistended with no tenderness.  He appears to be doing well postoperatively. His catheter is on traction.  I anticipate discharge in the morning. He'll be discharged home with some pain medication and he has antibiotics at home which he is going to complete.

## 2012-01-21 NOTE — Interval H&P Note (Signed)
History and Physical Interval Note:  01/21/2012 11:45 AM  Tracy [name]  has presented today for surgery, with the diagnosis of Benign Prostatic Hypertrophy with Outlet Obstruction  The various methods of treatment have been discussed with the patient and family. After consideration of risks, benefits and other options for treatment, the patient has consented to  Procedure(s) (LRB) with comments: TRANSURETHRAL RESECTION OF THE PROSTATE WITH GYRUS INSTRUMENTS (N/A) as a surgical intervention .  The patient's history has been reviewed, patient examined, no change in status, stable for surgery.  I have reviewed the patient's chart and labs.  Questions were answered to the patient's satisfaction.     [name]

## 2012-01-21 NOTE — Transfer of Care (Signed)
Immediate Anesthesia Transfer of Care Note  Patient: Tracy [name]  Procedure(s) Performed: Procedure(s) (LRB) with comments: TRANSURETHRAL RESECTION OF THE PROSTATE WITH GYRUS INSTRUMENTS (N/A)  Patient Location: PACU  Anesthesia Type: General  Level of Consciousness: sedated  Airway & Oxygen Therapy: Patient Spontanous Breathing and Patient connected to face mask oxygen  Post-op Assessment: Report given to PACU RN and Post -op Vital signs reviewed and stable  Post vital signs: Reviewed and stable  Complications: No apparent anesthesia complications

## 2012-01-21 NOTE — Op Note (Signed)
PATIENT:  Tracy [name]  PRE-OPERATIVE DIAGNOSIS: BPH with outlet obstruction  POST-OPERATIVE DIAGNOSIS: Same  PROCEDURE:  Procedure(s): TURP  SURGEON:  Surgeon(s): [name]  ANESTHESIA:   General  EBL:  less than 50 mL  DRAINS: Urinary Catheter (24 Fr., three-way Foley)   SPECIMEN:  Source of Specimen: Prostate chips  DISPOSITION OF SPECIMEN:  PATHOLOGY  Indication: Mr. Gear is a 78 year old who was initially seen for significant voiding symptoms consisting of hesitancy, nocturia and decreased force of stream, mild renal insufficiency, an ultrasound revealing no hydronephrosis and a PVR of 474 cc. After Foley catheterization his creatinine fell somewhat. He underwent urodynamics that revealed mild instability but significant outlet obstruction with high voiding pressure and incomplete emptying of the bladder. We discussed the options and he has elected to proceed with transurethral resection of the prostate at this time.  Description of operation: The patient was taken to the operating room and administered general anesthesia. He was then placed on the table and moved to the dorsal lithotomy position after which his genitalia was sterilely prepped and draped. An official timeout was then performed.  The 26 French resectoscope with Timberlake obturator was then introduced into the bladder and the obturator was removed. The resectoscope element with 12 lens was then inserted and the bladder was fully and systematically inspected. Ureteral orifices were noted to be in the normal anatomic positions. He was noted to have 3-4+ trabeculation. No tumors stones or inflammatory lesions were identified.  I first began by resecting from the bladder neck back to the level of the veru at the 6:00 position. There was no significant median lobe component. I then resected the left lobe of the prostate followed by the right lobe of the prostate down to surgical capsule. Apical tissue was then  resected with care being taken to maintain the resection proximal to the veru at all times. As bleeding points were encountered they were cauterized during the procedure and very little blood loss was encountered. I then used the Microvasive evacuator to remove all prostatic chips from the bladder and reinspection revealed the bladder to be intact with the ureteral orifices well away from the bladder neck. The prostatic fossa was well resected and there was no active bleeding at the end of the procedure. I therefore removed the resectoscope and inserted the Foley catheter. This was connected to closed system drainage and continuous irrigation and the patient was awakened and taken to the recovery room in stable and satisfactory condition. He tolerated procedure well no intraoperative complications.   PLAN OF CARE: Overnight observation with anticipated discharge in the morning.  PATIENT DISPOSITION:  PACU - hemodynamically stable.

## 2012-01-21 NOTE — Care Management Note (Unsigned)
    Page 1 of 2   02/03/2012     1:37:35 PM   CARE MANAGEMENT NOTE 02/03/2012  Patient:  Tracy [name], Tracy [name]   Account Number:  [id number]  Date Initiated:  01/21/2012  Documentation initiated by:  Lanier Clam  Subjective/Objective Assessment:   ADMITTED W/BPH W/OUTLET OBSTRUCTION.     Action/Plan:   FROM HOME W/SPOUSE.   Anticipated DC Date:  02/04/2012   Anticipated DC Plan:  HOME W HOME HEALTH SERVICES      DC Planning Services  CM consult      Choice offered to / List presented to:  C-1 Patient           Status of service:  Completed, signed off Medicare Important Message given?   (If response is "NO", the following Medicare IM given date fields will be blank) Date Medicare IM given:   Date Additional Medicare IM given:    Discharge Disposition:    Per UR Regulation:  Reviewed for med. necessity/level of care/duration of stay  If discussed at Long Length of Stay Meetings, dates discussed:   02/01/2012  02/03/2012    Comments:  02/03/12 Dorthia Tout RN,BSN NCM [phone] INT MED FOLLOWING.CREAT RISING.PT/OT-HH/SNF.PROVIDED W/HHC AGENCY LIST,& PRIVATE SITTER LIST.IF HOME W/HH WILL NEED ORDERS, & FACE TO FACE.  02/01/12 Lyndal Alamillo RN,BSN NCM [phone] TURP X2,SYNCOPE,N/V-ZOFRAN IV,KUB.D/C PLAN HOME WHEN MED STABLE.WOULD RECOMMEND HHRN-DISEASE MGMNT.  01/26/12 Chaos Carlile RN,BSN NCM [phone] PROVIDED PATIENT W/HHC AGENCY LIST AS RESOURCE.PATIENT HAD QUESTIONS ABOUT A STAIR LIFT.INFORMED THAT [facility] [employer] PROVIDES,& OTHER SUPPLY COMPANIES PROVIDE THEM,BUT WOULD HAVE TO MAKE HOME VISIT TO MEASURE STAIRS. HE COMPREHENED, & CAN DO THIS ON HIS OWN.RECOMMEND PT EVAL.  01/24/12 Archita Lomeli RN,BSN NCM [phone] POD#3 TURP.LOW HGB-7, 1U PRBC TODAY.D/C PLAN HOME WHEN MED STABLE. 01/21/12 Errol Ala RN,BSN NCM [phone]

## 2012-01-21 NOTE — Progress Notes (Signed)
Patient ID: Tracy [name], male   DOB: [birth year], 78 y.o.   MRN: [id number]  Nurse called and could not irrigate foley. I told her to stop CBI.   PE: Pt on toilet having a BM. Cleaned up and positioned supine in bed.  Abd - bladder distended.  Foley - irrigated two syringe of soft clot and bladder drained. Irrigated normally. Normal mobility of balloon. CBI running now. Bladder distention resolved.   Imp - Post-op TURP  Hematuria and clot retention resolved  Plan - continue CBI. Running now and urine red to light pink in tube.  Start Ancef based on last Cx - pharmacy to renal dose

## 2012-01-22 ENCOUNTER — Encounter (HOSPITAL_COMMUNITY): Payer: Self-pay | Admitting: Family Medicine

## 2012-01-22 ENCOUNTER — Other Ambulatory Visit (HOSPITAL_COMMUNITY): Payer: Medicare Other

## 2012-01-22 ENCOUNTER — Observation Stay (HOSPITAL_COMMUNITY): Payer: Medicare Other

## 2012-01-22 DIAGNOSIS — N184 Chronic kidney disease, stage 4 (severe): Secondary | ICD-10-CM

## 2012-01-22 DIAGNOSIS — D469 Myelodysplastic syndrome, unspecified: Secondary | ICD-10-CM

## 2012-01-22 DIAGNOSIS — R6889 Other general symptoms and signs: Secondary | ICD-10-CM

## 2012-01-22 DIAGNOSIS — D62 Acute posthemorrhagic anemia: Secondary | ICD-10-CM

## 2012-01-22 DIAGNOSIS — I5032 Chronic diastolic (congestive) heart failure: Secondary | ICD-10-CM

## 2012-01-22 LAB — HEMOGLOBIN AND HEMATOCRIT, BLOOD
HCT: 20.7 % — ABNORMAL LOW (ref 39.0–52.0)
HCT: 22.6 % — ABNORMAL LOW (ref 39.0–52.0)
Hemoglobin: 7.7 g/dL — ABNORMAL LOW (ref 13.0–17.0)

## 2012-01-22 LAB — PRO B NATRIURETIC PEPTIDE: Pro B Natriuretic peptide (BNP): 16466 pg/mL — ABNORMAL HIGH (ref 0–450)

## 2012-01-22 MED ORDER — CEFAZOLIN SODIUM 1-5 GM-% IV SOLN
1.0000 g | Freq: Two times a day (BID) | INTRAVENOUS | Status: DC
  Administered 2012-01-22 – 2012-01-28 (×14): 1 g via INTRAVENOUS
  Filled 2012-01-22 (×18): qty 50

## 2012-01-22 MED ORDER — CEFAZOLIN SODIUM 1-5 GM-% IV SOLN
1.0000 g | Freq: Three times a day (TID) | INTRAVENOUS | Status: DC
  Filled 2012-01-22: qty 50

## 2012-01-22 MED ORDER — FUROSEMIDE 10 MG/ML IJ SOLN
20.0000 mg | Freq: Once | INTRAMUSCULAR | Status: AC
  Administered 2012-01-22: 20 mg via INTRAVENOUS
  Filled 2012-01-22: qty 2

## 2012-01-22 MED ORDER — FUROSEMIDE 10 MG/ML IJ SOLN
20.0000 mg | Freq: Once | INTRAMUSCULAR | Status: DC
  Filled 2012-01-22: qty 2

## 2012-01-22 NOTE — Progress Notes (Signed)
Patient was hand irrigated many times last night due to his foley clotting after a TURP procedure. Physician notified every time but unable to monitor I/O's strictly during those times. Some Input and output was un documented.

## 2012-01-22 NOTE — Progress Notes (Signed)
ANTIBIOTIC CONSULT NOTE  Pharmacy Consult for Antibiotics Renal Dose Adjustment  Labs:  [facility] 01/22/12 0027 01/20/12 1053 01/19/12 1130  WBC -- -- 5.3  HGB 6.5* 8.9* 9.5*  PLT -- -- 48*  LABCREA -- -- --  CREATININE -- -- 2.85*    Assessment: 89 YOM s/p TURP 10/25 on Ancef 1g IV q8h post-op.  Patient has previous urine cx (12/25/10) with coag-negative staph that was sensitive to cefazolin. Patient's SCr is elevated and may be increasing.  CrCl~19.5 ml/min.  Goal of Therapy:  dose adjust per renal clearance  Plan:  Adjust Ancef to 1g IV q12h for renal insufficiency. F/u SCr for further adjustments.  Clance Boll 01/22/2012,8:58 AM

## 2012-01-22 NOTE — Progress Notes (Signed)
Patient ID: Tracy [name], male   DOB: 1920/01/16, 78 y.o.   MRN: [id number]   Foley clotted off again. The catheter was advanced. It was easily hand irrigated and quickly turned to pink. Bleeding much improved this AM compared to last night.   He has no complaints now. Getting 2nd unit of PRBC.   PE: A&Ox3 No focal deficits Abd - soft, NT GU - foley with CBI on a faster rate, urine light pink  Imp -  POD#1 TURP -  Plan - -continue CBI -Ancef ordered again -appreciate Hospitalist management of this complex patient

## 2012-01-22 NOTE — Progress Notes (Signed)
Patient ID: Tracy [name], male   DOB: 1919/08/05, 78 y.o.   MRN: [id number]  Nurse called and catheter clotted off again. CBI was just running slow.  I couldn't keep clear with irrigation.   PE: Abd was distended again.  Foley removed.  Pt prepped and draped - 22 Fr hematuria catheter placed - clots evacuated - placed on traction and CBI flow much improved and ran normally.  Pt without complaints. No CP or SOB A&Ox3 No focal deficits  Imp - gross hematuria following TURP -  Plan - Continue CBI STAT CBC and INR ordered

## 2012-01-22 NOTE — Progress Notes (Signed)
[facility] CONSULT PROGRESS NOTE  TYJAE ISSA ZOX:[id number] DOB: 1919/09/04 DOA: 01/21/2012 PCP: [name], MD  Brief narrative: [name] is a 78 year old man with a PMH of MDS, thrombocytopenia, CKD, CAD, atrial fibrillation (not currently on coumadin) who was admitted to the hospital by urologist on 01/20/12 for TURP.  He had significant bleeding into the foley post procedure with a recent hemoglobin of 6.5 mg/dL which prompted the consultation request.  He has required PRBCs and platelet transfusions in the past.  Assessment/Plan: Principal Problem:  *Anemia associated with acute blood loss  For 3 units of blood and one unit of platelets today.  Continue serial hemoglobin and hematocrit checks.  Monitor closely for fluid overload. Active Problems:  Essential hypertension, benign  Continue to hold metoprolol and Imdur for now.  Atrial fibrillation  Rate currently controlled. Not on anticoagulation.  CKD (chronic kidney disease), stage IV  Monitor creatinine.  MDS (myelodysplastic syndrome)  Chronic anemia and thrombocytopenia noted.  Hyperkalemia  Lasix should waste some.  Recheck in a.m.  Code Status: Full Family Communication: None at bedside. Disposition Plan: Per primary team.  HPI/Subjective: [name] is lethargic and sleepy this morning. He does not awaken to answer my questions. He has had 2 units of blood and is about to receive a unit of platelets with one other unit of blood scheduled.  Objective: Filed Vitals:   01/22/12 1030 01/22/12 1136 01/22/12 1154 01/22/12 1249  BP: 107/48 108/47 100/63 115/69  Pulse: 85 89 81 90  Temp: 97.3 F (36.3 C) 97.3 F (36.3 C) 97.9 F (36.6 C) 98.1 F (36.7 C)  TempSrc: Axillary Axillary Axillary Axillary  Resp: 20 20 16 16   Height:      Weight:      SpO2:        Intake/Output Summary (Last 24 hours) at 01/22/12 1344 Last data filed at 01/22/12 1343  Gross per 24 hour  Intake  81191 ml  Output   35275 ml  Net  -2143 ml    Exam: Gen:  NAD, sleepy. Cardiovascular:  HSIR Respiratory:  Lungs CTAB Gastrointestinal:  Abdomen soft, NT/ND, + BS Extremities:  No C/E/C  Data Reviewed: Basic Metabolic Panel:  Lab 01/19/12 4782  NA 134*  K 5.5*  CL 104  CO2 19  GLUCOSE 120*  BUN 38*  CREATININE 2.85*  CALCIUM 9.0  MG --  PHOS --   GFR Estimated Creatinine Clearance: 19.5 ml/min (by C-G formula based on Cr of 2.85). Liver Function Tests: No results found for this basename: AST:5,ALT:5,ALKPHOS:5,BILITOT:5,PROT:5,ALBUMIN:5 in the last 168 hours No results found for this basename: LIPASE:5,AMYLASE:5 in the last 168 hours No results found for this basename: AMMONIA:5 in the last 168 hours Coagulation profile  Lab 01/22/12 0027 01/19/12 1130  INR 1.41 1.27  PROTIME -- --    CBC:  Lab 01/22/12 0027 01/20/12 1053 01/19/12 1130  WBC -- -- 5.3  NEUTROABS -- -- --  HGB 6.5* 8.9* 9.5*  HCT 20.7* -- 30.3*  MCV -- -- 97.4  PLT -- -- 48*   BNP (last 3 results)  Basename 12/08/11 1600 08/04/11 1241 02/16/11 1044  PROBNP 9654.0* 601.0* 3945.0*   Microbiology Recent Results (from the past 240 hour(s))  SURGICAL PCR SCREEN     Status: Normal   Collection Time   01/19/12 10:54 AM      Component Value Range Status Comment   MRSA, PCR NEGATIVE  NEGATIVE Final    Staphylococcus aureus NEGATIVE  NEGATIVE  Final      Procedures and Diagnostic Studies:  Dg Chest 1 View 01/22/2012 IMPRESSION: Cardiac enlargement with increasing pulmonary vascular congestion and perihilar edema.  Small bilateral pleural effusions and basilar atelectasis or infiltration.   Original Report Authenticated By: Marlon Pel, M.D.     Dg Chest 2 View 01/21/2012 IMPRESSION: Cardiomegaly.  Interstitial edema.  Effusions left larger than right.  Density in the left lower lung could be due to atelectasis or pneumonia.   Original Report Authenticated By: Thomasenia Sales, M.D.     Dg Chest 2 View  01/19/2012 IMPRESSION:  1.  Residual or recurrent left lower lobe airspace disease.  Favor infection or aspiration. Recommend radiographic follow-up until clearing. 2.  Small bilateral pleural effusions.  New on the right and new or increased on the left. 3.  Cardiomegaly with mild pulmonary venous congestion.  These results will be called to the ordering clinician or representative by the Radiologist Assistant, and communication documented in the PACS Dashboard.   Original Report Authenticated By: Consuello Bossier, M.D.    Scheduled Meds:    .  ceFAZolin (ANCEF) IV  1 g Intravenous Q12H  . furosemide  20 mg Intravenous Once  . DISCONTD:  ceFAZolin (ANCEF) IV  1 g Intravenous Q8H  . DISCONTD: ciprofloxacin  200 mg Intravenous Q12H  . DISCONTD: furosemide  20 mg Intravenous Once  . DISCONTD: vancomycin  500 mg Intravenous Q24H   Continuous Infusions:    . sodium chloride 1,000 mL (01/22/12 0212)  . DISCONTD: sodium chloride 1,000 mL (01/21/12 1154)    Time spent: 25 minutes.   LOS: 1 day   RAMA,CHRISTINA  [facility] Pager [phone].  If 8PM-8AM, please contact night-coverage at [email or web address], password [facility] 01/22/2012, 1:44 PM

## 2012-01-22 NOTE — Progress Notes (Signed)
Patient ID: Tracy [name], male   DOB: Dec 18, 1919, 78 y.o.   MRN: [id number]  I spoke with hospitalist Dr. Joneen Roach and went over patients history and labs and requested consultation on his anemia and low platelets. She will see patient.   I spoke with Nurse Boneta Lucks -- CBI is running and urine clearing.

## 2012-01-22 NOTE — Consult Note (Addendum)
PCP:   [name], MD  Oncology: Dr. Shirline Frees Pulmonologists: Dr. Virgel Manifold Nephrology: Dr. Caryn Section Cardiologist: Dr. [place] Lions   Consult requested by Urologist Dr. Mena Goes  Reason for consult:  Anemia  HPI:  This is a 78 year old male with a diagnosis of MDS, thrombocytopenia and chronic kidney failure, he had a TURP procedure done yesterday. He's had a significant bleeding into the Foley, his most recent hemoglobin is a 6.5 and his last platelet count was was 48. Patient does have a history of coronary artery disease and atrial fibrillation. He is no longer on Coumadin. He denies any chest pains or any shortness of breath. He has chronic kidney disease but states he urinates the normal amount. He denies feeling weak or lightheaded.  Patient was just admitted here 9/27 he then received 4 units pack red blood cells, 2 units of platelets and plasma. He is also on Aranesp injections as an outpatient.  Review of Systems:  The patient denies anorexia, fever, weight loss,, vision loss, decreased hearing, hoarseness, chest pain, syncope, dyspnea on exertion, peripheral edema, balance deficits, hemoptysis, abdominal pain, melena, hematochezia, severe indigestion/heartburn, hematuria, incontinence, genital sores, muscle weakness, suspicious skin lesions, transient blindness, difficulty walking, depression, unusual weight change, abnormal bleeding, enlarged lymph nodes, angioedema, and breast masses.  Past Medical History: Past Medical History  Diagnosis Date  . CAD (coronary artery disease)     status post prior PCI to the obtuse marginal in 1997 and stenting to the mid to distal RCA in 2001; LHC 9/06:  LM ok, pLAD 50%, mLAD 60% then 60-70%, mRI 50%, mOM1 80-90% (small), pRCA and dRCA stents ok, PDA 50%, EF 65%;  Myoview 5/12: No ischemia, EF 64%.;  NSTEMI 11/12 tx medically   . Chronic kidney disease, stage III (moderate)   . TIA (transient ischemic attack)   . HTN (hypertension)   . Hyperlipidemia    . AF (atrial fibrillation) 02/16/11    Coumadin ==> patient decided to stop after admxn with worsening anemia in setting of UGI bleed, epistaxis  . Arthritis     "in my left ankle"  . Chronic diastolic heart failure   . Carotid stenosis     dopplers 03/2011: 0-39% bilateral  . MDS (myelodysplastic syndrome) 12/16/2011  . Hx of echocardiogram     Echocardiogram 02/17/11: Severe LVH, asymmetric hypertrophy, EF 50-55%, normal wall motion, high ventricular filling pressure, mild LAE, mild RVE, mildly reduced RVSF, mild RAE.  Marland Kitchen Myocardial infarction 2012  . Stroke     5 yrs ago.  Mini - NO RESIDUAL PROBLEMS  . Anemia of chronic disease     RECENT HOSPITALIZATION / BLOOD TRANSFUSION OCT 2013  . Shortness of breath     WITH ANY ACTIVITY---  NO OXYGEN  . CHF (congestive heart failure)     CHRONIC DIASTOLIC HEART FAILURE   Past Surgical History  Procedure Date  . Nose surgery 1986    deviated septum  . Total knee arthroplasty 2009    left  . Joint replacement 2009    left knee  . Surgery scrotal / testicular 197O    CORRECTIVE SURGERY TO VARICOCELE (SWELLING OF SCROTUM)  . Esophagogastroduodenoscopy 12/09/2011    Procedure: ESOPHAGOGASTRODUODENOSCOPY (EGD);  Surgeon: Willis Modena, MD;  Location: [facility] ENDOSCOPY;  Service: Endoscopy;  Laterality: N/A;  outlaw/ebp  . Tonsillectomy and adenoidectomy 1929  . Appendectomy 1930's  . Left ear mastoid surgery 1933  . Removal of one testicle 1944  . Correction to botched varicocele surgery 1970  .  Eye surgery 1993    SURGERY FOR DETACHED RETINA LEFT EYE  . Left cataract extraction with lens implant 1993  . Coronary angioplasty with stent placement     3 procedures; 3 stents total  1994 & 1996  . Right cataract extraction with lens implant   1997    Medications: Prior to Admission medications   Medication Sig Start Date End Date Taking? Authorizing Provider  acetaminophen (TYLENOL) 500 MG tablet Take 500 mg by mouth every 6 (six) hours as  needed. For pain   Yes Historical Provider, MD  metoprolol (LOPRESSOR) 50 MG tablet Take 1 tablet (50 mg total) by mouth 2 (two) times daily. 01/11/12 01/10/13 Yes Beatrice Lecher, PA  nitrofurantoin (MACRODANTIN) 100 MG capsule Take 100 mg by mouth 2 (two) times daily after a meal. X 7 DAYS    STARTED Monday 01/17/12   TAKES WITH FOOD OR MILK   Yes Historical Provider, MD  nitroGLYCERIN (NITROSTAT) 0.4 MG SL tablet Place 0.4 mg under the tongue every 5 (five) minutes as needed. For chest pain   Yes Historical Provider, MD  Darbepoetin Alfa-Polysorbate (ARANESP, ALBUMIN FREE,) 150 MCG/0.75ML SOLN Inject 150 mg as directed every 7 (seven) days. Friday.    Historical Provider, MD  isosorbide mononitrate (IMDUR) 30 MG 24 hr tablet Take 30 mg by mouth every evening.    Historical Provider, MD  traMADol (ULTRAM) 50 MG tablet Take 1 tablet (50 mg total) by mouth every 6 (six) hours as needed for pain. 01/21/12   [name], MD    Allergies:   Allergies  Allergen Reactions  . Oxycodone Hcl Other (See Comments)    Wanted to climb the wall  . Prednisone Other (See Comments)    Gain weight     Social History:  reports that he quit smoking about 33 years ago. His smoking use included Cigarettes and Pipe. He has a 80 pack-year smoking history. He has never used smokeless tobacco. He reports that he drinks alcohol. He reports that he does not use illicit drugs.  Family History: Unknown, patient left family at age of 50  Physical Exam: Filed Vitals:   01/21/12 1345 01/21/12 1400 01/21/12 1426 01/21/12 2200  BP: 134/81 146/91 146/96 150/77  Pulse: 65 70 72 88  Temp:  97.6 F (36.4 C) 97.3 F (36.3 C) 97.7 F (36.5 C)  TempSrc:   Axillary Oral  Resp: 15 18  20   Height:   6\' 1"  (1.854 m)   Weight:   83.6 kg (184 lb 4.9 oz)   SpO2: 100% 96% 98% 99%    General:  Alert and oriented times three, well developed and nourished, no acute distress, pale Eyes: PERRLA, pale conjunctiva, no scleral  icterus ENT: Moist oral mucosa, neck supple, no thyromegaly Lungs: clear to ascultation, no wheeze, no crackles, no use of accessory muscles Cardiovascular: regular rate and rhythm, no regurgitation, no gallops, no murmurs. No carotid bruits, no JVD Abdomen: soft, positive BS, non-tender, non-distended, no organomegaly, not an acute abdomen GU: not examined Neuro: CN II - XII grossly intact, sensation intact Musculoskeletal: strength 5/5 all extremities, no clubbing, cyanosis or edema Skin: no rash, no subcutaneous crepitation, no decubitus Psych: appropriate patient   Labs on Admission:   Basename 01/19/12 1130  NA 134*  K 5.5*  CL 104  CO2 19  GLUCOSE 120*  BUN 38*  CREATININE 2.85*  CALCIUM 9.0  MG --  PHOS --   No results found for this basename:  AST:2,ALT:2,ALKPHOS:2,BILITOT:2,PROT:2,ALBUMIN:2 in the last 72 hours No results found for this basename: LIPASE:2,AMYLASE:2 in the last 72 hours  Basename 01/22/12 0027 01/20/12 1053 01/19/12 1130  WBC -- -- 5.3  NEUTROABS -- -- --  HGB 6.5* 8.9* --  HCT 20.7* -- 30.3*  MCV -- -- 97.4  PLT -- -- 48*   No results found for this basename: CKTOTAL:3,CKMB:3,CKMBINDEX:3,TROPONINI:3 in the last 72 hours No components found with this basename: POCBNP:3 No results found for this basename: DDIMER:2 in the last 72 hours No results found for this basename: HGBA1C:2 in the last 72 hours No results found for this basename: CHOL:2,HDL:2,LDLCALC:2,TRIG:2,CHOLHDL:2,LDLDIRECT:2 in the last 72 hours No results found for this basename: TSH,T4TOTAL,FREET3,T3FREE,THYROIDAB in the last 72 hours No results found for this basename: VITAMINB12:2,FOLATE:2,FERRITIN:2,TIBC:2,IRON:2,RETICCTPCT:2 in the last 72 hours  Micro Results: Recent Results (from the past 240 hour(s))  SURGICAL PCR SCREEN     Status: Normal   Collection Time   01/19/12 10:54 AM      Component Value Range Status Comment   MRSA, PCR NEGATIVE  NEGATIVE Final    Staphylococcus  aureus NEGATIVE  NEGATIVE Final      Radiological Exams on Admission: Dg Chest 2 View  01/21/2012  *RADIOLOGY REPORT*  Clinical Data: Preoperative respiratory exam for prostate surgery. Atrial fibrillation.  Hypertension.  CHEST - 2 VIEW  Comparison: 01/19/2012 and multiple previous  Findings: There is chronic cardiomegaly and atherosclerosis of the aorta.  There are bilateral effusions, larger on the left than the right, with lower lung volume loss.  There is probably early interstitial edema.  Density in the left lower lung could be due to volume loss and/or pneumonia.  IMPRESSION: Cardiomegaly.  Interstitial edema.  Effusions left larger than right.  Density in the left lower lung could be due to atelectasis or pneumonia.   Original Report Authenticated By: Thomasenia Sales, M.D.     Assessment/Plan Present on Admission:   anemia due to acute bleed  M.D. S./thrombocytopenia Status post TURP Coronary artery disease/A. fib Will order transfusion of 3 units pack red blood cells +1 unit FFP Lasix between units Serial H&H  Patient not on Coumadin, INR is normal Monitor for fluid overload CBC posttransfusion Interstitial edema Care for fluid overload Repeat chest x-ray in a.m.,  Chronic kidney disease Dyslipidemia Hypertension History of CVA Stable   Full Code SCDs for DVT prophylaxis    Alan Riles 01/22/2012, 1:59 AM

## 2012-01-23 LAB — BASIC METABOLIC PANEL
GFR calc Af Amer: 21 mL/min — ABNORMAL LOW (ref >90)
GFR calc non Af Amer: 18 mL/min — ABNORMAL LOW (ref >90)
Potassium: 5.1 mEq/L (ref 3.5–5.1)
Sodium: 133 mEq/L — ABNORMAL LOW (ref 135–145)

## 2012-01-23 LAB — HEMOGLOBIN AND HEMATOCRIT, BLOOD
HCT: 19.3 % — ABNORMAL LOW (ref 39.0–52.0)
HCT: 22.8 % — ABNORMAL LOW (ref 39.0–52.0)
HCT: 23.1 % — ABNORMAL LOW (ref 39.0–52.0)
Hemoglobin: 6.8 g/dL — CL (ref 13.0–17.0)
Hemoglobin: 7.8 g/dL — ABNORMAL LOW (ref 13.0–17.0)
Hemoglobin: 7.7 g/dL — ABNORMAL LOW (ref 13.0–17.0)

## 2012-01-23 MED ORDER — CEFAZOLIN SODIUM 1-5 GM-% IV SOLN: 1.0000 g | Freq: Two times a day (BID) | INTRAVENOUS | Status: DC

## 2012-01-23 MED ORDER — ISOSORBIDE MONONITRATE ER 30 MG PO TB24
30.0000 mg | ORAL_TABLET | Freq: Every evening | ORAL | Status: DC
  Administered 2012-01-24 – 2012-02-04 (×13): 30 mg via ORAL
  Filled 2012-01-23 (×14): qty 1

## 2012-01-23 NOTE — Progress Notes (Signed)
CRITICAL VALUE ALERT  Critical value received:  Hgb 6.8  Date of notification:  01/23/2012  Time of notification:  0145 Critical value read back:yes  Nurse who received alert:  Ophelia Charter RN  MD notified (1st page):  Lenny Pastel NP  Time of first page:  0204  MD notified (2nd page):  Time of second page:  Responding MD:  Ellender Hose  Time MD responded:  [phone]

## 2012-01-23 NOTE — Progress Notes (Signed)
2 Days Post-Op Subjective: Patient reports no complaints. He feels much better and is in good spirits. He's been up out of bed ambulating.   Objective: Vital signs in last 24 hours: Temp:  [97.3 F (36.3 C)-98.2 F (36.8 C)] 97.8 F (36.6 C) (10/27 0622) Pulse Rate:  [78-101] 85  (10/27 0622) Resp:  [16-20] 16  (10/27 0622) BP: (100-144)/(47-89) 106/52 mmHg (10/27 0622) SpO2:  [97 %-98 %] 98 % (10/27 0622) Weight:  [80.7 kg (177 lb 14.6 oz)] 80.7 kg (177 lb 14.6 oz) (10/27 0525)  Intake/Output from previous day: 10/26 0701 - 10/27 0700 In: 1753.5 [P.O.:240; I.V.:250; Blood:1163.5; IV Piggyback:100] Out: 16109 [Urine:25175] Intake/Output this shift: Total I/O In: 480 [P.O.:480] Out: 900 [Urine:900]  Physical Exam:  NAD A&Ox3 Abd - soft, NT, ND GU - foley with CBI on medium rate and urine is very light pink to clear. When CBI is slowed urine gets closer to pink/red. No clots.   Lab Results:  Basename 01/23/12 0901 01/23/12 0120 01/22/12 2200  HGB 7.8* 6.8* 7.7*  HCT 22.8* 19.3* 22.6*   BMET  Basename 01/23/12 0120  NA 133*  K 5.1  CL 107  CO2 17*  GLUCOSE 124*  BUN 39*  CREATININE 2.89*  CALCIUM 7.0*     Assessment/Plan: S/p TURP -  -continue CBI for another day. His hematuria continues to improve and is much better than yesterday. It is very light this morning but increases as expected when he is up and about.  -he will continue to ambulate and be up OOB -H/H this AM back up to 7.8. Appreciate Hospitalist assistance.    LOS: 2 days   Antony Haste 01/23/2012, 9:24 AM

## 2012-01-23 NOTE — Progress Notes (Signed)
Dr. Mena Goes was made aware of Hgb of 6.8. Instructed for me to page the hospitalist about it.

## 2012-01-23 NOTE — Progress Notes (Signed)
Nutrition Brief Note  Patient identified on the Malnutrition Screening Tool (MST) Report.  Body mass index is 23.47 kg/(m^2). Pt meets criteria for Normal weight based on current BMI.   Current diet order is Regular, patient is consuming approximately 75-100% of meals at this time. Labs and medications reviewed.   No nutrition interventions warranted at this time. If nutrition issues arise, please consult RD.   Leonette Most RD, LDN

## 2012-01-23 NOTE — Progress Notes (Signed)
[facility] CONSULT PROGRESS NOTE  ESSA MALACHI QMV:[id number] DOB: 24-Apr-1919 DOA: 01/21/2012 PCP: [name], MD  Brief narrative: Tracy [name] is a 78 year old man with a PMH of MDS, thrombocytopenia, CKD, CAD, atrial fibrillation (not currently on coumadin) who was admitted to the hospital by urologist on 01/20/12 for TURP.  He had significant bleeding into the foley post procedure with a recent hemoglobin of 6.5 mg/dL which prompted the consultation request.  He has required PRBCs and platelet transfusions in the past.  Assessment/Plan: Principal Problem:  *Anemia associated with acute blood loss  Given 3 units of blood and one unit of platelets 01/22/12, and an additional unit of PRBCs on 01/23/12.  Continue serial hemoglobin and hematocrit checks.  Monitor closely for fluid overload. Active Problems:  Essential hypertension, benign  Continue to hold metoprolol given low HR but resume Imdur.  Atrial fibrillation  Rate currently controlled. Not on anticoagulation.  CKD (chronic kidney disease), stage IV  Monitor creatinine.  MDS (myelodysplastic syndrome)  Chronic anemia and thrombocytopenia noted.  Hyperkalemia  Corrected with Lasix.  Code Status: Full Family Communication: Wife at bedside. Disposition Plan: Per primary team.  HPI/Subjective: Tracy [name] is much more awake and alert today. He states he feels much better than yesterday.  No pain.  No N/V.  Lengthy discussion with he and his wife about role of hospitalist in his care.  Has had negative experiences with hospitalists changing his medications in the past.    Objective: Filed Vitals:   01/23/12 0513 01/23/12 0525 01/23/12 0613 01/23/12 0622  BP: 128/63  122/58 106/52  Pulse: 78  88 85  Temp: 97.8 F (36.6 C)  98 F (36.7 C) 97.8 F (36.6 C)  TempSrc: Oral  Oral Oral  Resp: 16  16 16   Height:      Weight:  80.7 kg (177 lb 14.6 oz)    SpO2: 97%  98% 98%    Intake/Output Summary (Last 24  hours) at 01/23/12 0722 Last data filed at 01/23/12 0639  Gross per 24 hour  Intake 1753.5 ml  Output  28413 ml  Net -21621.5 ml    Exam: Gen:  NAD, sleepy. Cardiovascular:  HSIR Respiratory:  Lungs CTAB Gastrointestinal:  Abdomen soft, NT/ND, + BS Extremities:  No C/E/C  Data Reviewed: Basic Metabolic Panel:  Lab 01/23/12 2440 01/19/12 1130  NA 133* 134*  K 5.1 5.5*  CL 107 104  CO2 17* 19  GLUCOSE 124* 120*  BUN 39* 38*  CREATININE 2.89* 2.85*  CALCIUM 7.0* 9.0  MG -- --  PHOS -- --   GFR Estimated Creatinine Clearance: 19.2 ml/min (by C-G formula based on Cr of 2.89). Coagulation profile  Lab 01/22/12 0027 01/19/12 1130  INR 1.41 1.27  PROTIME -- --    CBC:  Lab 01/23/12 0120 01/22/12 2200 01/22/12 0027 01/20/12 1053 01/19/12 1130  WBC -- -- -- -- 5.3  NEUTROABS -- -- -- -- --  HGB 6.8* 7.7* 6.5* 8.9* 9.5*  HCT 19.3* 22.6* 20.7* -- 30.3*  MCV -- -- -- -- 97.4  PLT -- -- -- -- 48*   BNP (last 3 results)  Basename 01/22/12 2200 12/08/11 1600 08/04/11 1241  PROBNP 16466.0* 9654.0* 601.0*   Microbiology Recent Results (from the past 240 hour(s))  SURGICAL PCR SCREEN     Status: Normal   Collection Time   01/19/12 10:54 AM      Component Value Range Status Comment   MRSA, PCR NEGATIVE  NEGATIVE Final  Staphylococcus aureus NEGATIVE  NEGATIVE Final      Procedures and Diagnostic Studies:  Dg Chest 1 View 01/22/2012 IMPRESSION: Cardiac enlargement with increasing pulmonary vascular congestion and perihilar edema.  Small bilateral pleural effusions and basilar atelectasis or infiltration.   Original Report Authenticated By: Marlon Pel, M.D.     Dg Chest 2 View 01/21/2012 IMPRESSION: Cardiomegaly.  Interstitial edema.  Effusions left larger than right.  Density in the left lower lung could be due to atelectasis or pneumonia.   Original Report Authenticated By: Thomasenia Sales, M.D.     Dg Chest 2 View 01/19/2012 IMPRESSION:  1.  Residual or  recurrent left lower lobe airspace disease.  Favor infection or aspiration. Recommend radiographic follow-up until clearing. 2.  Small bilateral pleural effusions.  New on the right and new or increased on the left. 3.  Cardiomegaly with mild pulmonary venous congestion.  These results will be called to the ordering clinician or representative by the Radiologist Assistant, and communication documented in the PACS Dashboard.   Original Report Authenticated By: Consuello Bossier, M.D.    Scheduled Meds:    .  ceFAZolin (ANCEF) IV  1 g Intravenous Q12H  . DISCONTD:  ceFAZolin (ANCEF) IV  1 g Intravenous Q8H   Continuous Infusions:    . sodium chloride 100 mL/hr at 01/22/12 2311    Time spent: 45 minutes.   LOS: 2 days   Tracy [name],Tracy [name]  [facility] Pager [phone].  If 8PM-8AM, please contact night-coverage at [email or web address], password [facility] 01/23/2012, 7:22 AM

## 2012-01-24 ENCOUNTER — Encounter (HOSPITAL_COMMUNITY): Payer: Self-pay | Admitting: Urology

## 2012-01-24 DIAGNOSIS — I5042 Chronic combined systolic (congestive) and diastolic (congestive) heart failure: Secondary | ICD-10-CM | POA: Diagnosis present

## 2012-01-24 DIAGNOSIS — R7989 Other specified abnormal findings of blood chemistry: Secondary | ICD-10-CM

## 2012-01-24 LAB — PREPARE PLATELET PHERESIS: Unit division: 0

## 2012-01-24 LAB — BASIC METABOLIC PANEL
BUN: 34 mg/dL — ABNORMAL HIGH (ref 6–23)
CO2: 17 mEq/L — ABNORMAL LOW (ref 19–32)
Chloride: 111 mEq/L (ref 96–112)
Creatinine, Ser: 2.55 mg/dL — ABNORMAL HIGH (ref 0.50–1.35)

## 2012-01-24 LAB — HEMOGLOBIN AND HEMATOCRIT, BLOOD
HCT: 20.9 % — ABNORMAL LOW (ref 39.0–52.0)
HCT: 21.5 % — ABNORMAL LOW (ref 39.0–52.0)
HCT: 24.9 % — ABNORMAL LOW (ref 39.0–52.0)
Hemoglobin: 7 g/dL — ABNORMAL LOW (ref 13.0–17.0)
Hemoglobin: 7.1 g/dL — ABNORMAL LOW (ref 13.0–17.0)
Hemoglobin: 8.3 g/dL — ABNORMAL LOW (ref 13.0–17.0)

## 2012-01-24 LAB — PREPARE RBC (CROSSMATCH)

## 2012-01-24 MED ORDER — METOPROLOL TARTRATE 50 MG PO TABS
50.0000 mg | ORAL_TABLET | Freq: Two times a day (BID) | ORAL | Status: DC
  Administered 2012-01-24 – 2012-02-01 (×17): 50 mg via ORAL
  Filled 2012-01-24 (×20): qty 1

## 2012-01-24 NOTE — Progress Notes (Signed)
[facility] CONSULT PROGRESS NOTE  Tracy [name] ZOX:[id number] DOB: Nov 06, 1919 DOA: 01/21/2012 PCP: [name], MD  Brief narrative: Tracy [name] is a 78 year old man with a PMH of MDS, thrombocytopenia, CKD, CAD, atrial fibrillation (not currently on coumadin) who was admitted to the hospital by urologist on 01/20/12 for TURP.  He had significant bleeding into the foley post procedure with a recent hemoglobin of 6.5 mg/dL which prompted the consultation request.  He has required PRBCs and platelet transfusions in the past.  Assessment/Plan: Principal Problem:  *Anemia associated with acute blood loss  Given 3 units of blood and one unit of platelets 01/22/12, 1unit of PRBCs on 01/23/12, and 1 unit on 01/24/12.  Continue serial hemoglobin and hematocrit checks.  Monitor closely for fluid overload. Active Problems:  Elevated proBNP / chronic systolic and diastolic CHF  2 D Echo done 12/26/11.  EF 50%.  Chronic atrial fibrillation/diastolic dysfunction.    Well compensated clinically.  KVO IVF.  Essential hypertension, benign  Resume metoprolol, Imdur resumed 01/23/12.     Atrial fibrillation  Rate currently controlled. Not on anticoagulation.  CKD (chronic kidney disease), stage IV  Monitor creatinine.  MDS (myelodysplastic syndrome)  Chronic anemia and thrombocytopenia noted.  Hyperkalemia  Corrected with Lasix.  Code Status: Full Family Communication: None at bedside. Disposition Plan: Per primary team.  Possibly home in next 24-48 hours.  HPI/Subjective: Tracy [name] feels well.  He is still having penile bleeding. No chest pain or dyspnea.  Feels deconditioned but refused PT (whom he refers to as "[employer]")    Objective: Filed Vitals:   01/24/12 0639 01/24/12 1030 01/24/12 1100 01/24/12 1145  BP: 136/68 122/66 128/81 138/70  Pulse: 65 85 93 76  Temp: 98.4 F (36.9 C) 98.2 F (36.8 C) 98 F (36.7 C) 98.4 F (36.9 C)  TempSrc: Oral Oral Oral Oral   Resp: 18 18 18 18   Height:      Weight: 82.5 kg (181 lb 14.1 oz)     SpO2: 98%       Intake/Output Summary (Last 24 hours) at 01/24/12 1242 Last data filed at 01/24/12 1228  Gross per 24 hour  Intake 10622.5 ml  Output   6850 ml  Net 3772.5 ml    Exam: Gen:  NAD, sleepy. Cardiovascular:  HSIR Respiratory:  Lungs CTAB Gastrointestinal:  Abdomen soft, NT/ND, + BS Genitourinary: Washcloth soaked with blood from penile bleeding noted. Extremities:  No C/E/C  Data Reviewed: Basic Metabolic Panel:  Lab 01/24/12 8119 01/23/12 0120 01/19/12 1130  NA 136 133* 134*  K 4.6 5.1 --  CL 111 107 104  CO2 17* 17* 19  GLUCOSE 99 124* 120*  BUN 34* 39* 38*  CREATININE 2.55* 2.89* 2.85*  CALCIUM 7.0* 7.0* 9.0  MG -- -- --  PHOS -- -- --   GFR Estimated Creatinine Clearance: 21.8 ml/min (by C-G formula based on Cr of 2.55). Coagulation profile  Lab 01/22/12 0027 01/19/12 1130  INR 1.41 1.27  PROTIME -- --    CBC:  Lab 01/24/12 0845 01/24/12 0155 01/23/12 1709 01/23/12 0901 01/23/12 0120 01/19/12 1130  WBC -- -- -- -- -- 5.3  NEUTROABS -- -- -- -- -- --  HGB 7.1* 7.0* 7.7* 7.8* 6.8* --  HCT 21.5* 20.9* 23.1* 22.8* 19.3* --  MCV -- -- -- -- -- 97.4  PLT -- -- -- -- -- 48*   BNP (last 3 results)  Basename 01/22/12 2200 12/08/11 1600 08/04/11 1241  PROBNP  16466.0* 9654.0* 601.0*   Microbiology Recent Results (from the past 240 hour(s))  SURGICAL PCR SCREEN     Status: Normal   Collection Time   01/19/12 10:54 AM      Component Value Range Status Comment   MRSA, PCR NEGATIVE  NEGATIVE Final    Staphylococcus aureus NEGATIVE  NEGATIVE Final      Procedures and Diagnostic Studies:  Dg Chest 1 View 01/22/2012 IMPRESSION: Cardiac enlargement with increasing pulmonary vascular congestion and perihilar edema.  Small bilateral pleural effusions and basilar atelectasis or infiltration.   Original Report Authenticated By: Marlon Pel, M.D.     Dg Chest 2 View  01/21/2012 IMPRESSION: Cardiomegaly.  Interstitial edema.  Effusions left larger than right.  Density in the left lower lung could be due to atelectasis or pneumonia.   Original Report Authenticated By: Thomasenia Sales, M.D.     Dg Chest 2 View 01/19/2012 IMPRESSION:  1.  Residual or recurrent left lower lobe airspace disease.  Favor infection or aspiration. Recommend radiographic follow-up until clearing. 2.  Small bilateral pleural effusions.  New on the right and new or increased on the left. 3.  Cardiomegaly with mild pulmonary venous congestion.  These results will be called to the ordering clinician or representative by the Radiologist Assistant, and communication documented in the PACS Dashboard.   Original Report Authenticated By: Consuello Bossier, M.D.    Scheduled Meds:    .  ceFAZolin (ANCEF) IV  1 g Intravenous Q12H  . isosorbide mononitrate  30 mg Oral QPM  . metoprolol  50 mg Oral BID  . DISCONTD:  ceFAZolin (ANCEF) IV  1 g Intravenous Q12H   Continuous Infusions:    . sodium chloride 100 mL/hr at 01/24/12 0310    Time spent: 45 minutes.   LOS: 3 days   RAMA,CHRISTINA  [facility] Pager [phone].  If 8PM-8AM, please contact night-coverage at [email or web address], password [facility] 01/24/2012, 12:42 PM

## 2012-01-24 NOTE — Progress Notes (Signed)
3 Days Post-Op  Subjective: Patient reports occasionally having the sensation of needing to urinate despite having a catheter in. He has leaked a little bit around the catheter consistent with bladder spasms. He is not having any pain. He denies dizziness or weakness.  Objective: Vital signs in last 24 hours: Temp:  [97.9 F (36.6 C)-98.4 F (36.9 C)] 98.4 F (36.9 C) (10/28 0639) Pulse Rate:  [65-97] 65  (10/28 0639) Resp:  [18-20] 18  (10/28 0639) BP: (123-150)/(55-77) 136/68 mmHg (10/28 0639) SpO2:  [93 %-99 %] 98 % (10/28 0639) Weight:  [82.5 kg (181 lb 14.1 oz)] 82.5 kg (181 lb 14.1 oz) (10/28 0639)  Intake/Output from previous day: 10/27 0701 - 10/28 0700 In: 7060 [P.O.:960; I.V.:4700; IV Piggyback:100] Out: 6375 [Urine:6375] Intake/Output this shift: Total I/O In: 50 [IV Piggyback:50] Out: -   Physical Exam:  His abdomen is soft and nontender. His Foley catheter is in place. The CBI is running a low rate. His catheter is not on traction.  Lab Results:  Basename 01/24/12 0155 01/23/12 1709 01/23/12 0901  HGB 7.0* 7.7* 7.8*  HCT 20.9* 23.1* 22.8*   BMET  Basename 01/24/12 0155 01/23/12 0120  NA 136 133*  K 4.6 5.1  CL 111 107  CO2 17* 17*  GLUCOSE 99 124*  BUN 34* 39*  CREATININE 2.55* 2.89*  CALCIUM 7.0* 7.0*    Basename 01/22/12 0027  LABPT --  INR 1.41   No results found for this basename: LABURIN:1 in the last 72 hours Results for orders placed during the hospital encounter of 01/19/12  SURGICAL PCR SCREEN     Status: Normal   Collection Time   01/19/12 10:54 AM      Component Value Range Status Comment   MRSA, PCR NEGATIVE  NEGATIVE Final    Staphylococcus aureus NEGATIVE  NEGATIVE Final     Studies/Results: No results found.  Assessment/Plan: He overall seems to be doing well although his hemoglobin has drifted back down. He is not having significant symptoms of acute blood loss anemia however due to his advanced age I agree with  transfusion at this time. His catheter is off of traction now and his bleeding has decreased. It increases slightly when he gets up and is more active. I have decreased the CBI and also removed 15 of the 30 cc in his catheter balloon. If he remains clear I will consider stopping his CBI either this evening or tomorrow. I am going to hold off on the use of an anticholinergic for his mild bladder spasms because there minimal in severity and I do not want to do anything that would affect his voiding trial once the catheter is removed.  Transfuse today  Follow CBC  I will consider stopping his CBI if his urine remains clear today.  Possible discharge tomorrow or the next day.   LOS: 3 days   Tracy [name] C 01/24/2012, 8:21 AM

## 2012-01-25 ENCOUNTER — Encounter (HOSPITAL_COMMUNITY): Payer: Medicare Other

## 2012-01-25 LAB — HEMOGLOBIN AND HEMATOCRIT, BLOOD
HCT: 21.7 % — ABNORMAL LOW (ref 39.0–52.0)
HCT: 24 % — ABNORMAL LOW (ref 39.0–52.0)
HCT: 24.2 % — ABNORMAL LOW (ref 39.0–52.0)
Hemoglobin: 7.3 g/dL — ABNORMAL LOW (ref 13.0–17.0)
Hemoglobin: 7.9 g/dL — ABNORMAL LOW (ref 13.0–17.0)
Hemoglobin: 8 g/dL — ABNORMAL LOW (ref 13.0–17.0)

## 2012-01-25 LAB — TYPE AND SCREEN
ABO/RH(D): O NEG
Antibody Screen: NEGATIVE
Unit division: 0
Unit division: 0
Unit division: 0
Unit division: 0
Unit division: 0

## 2012-01-25 LAB — CBC
MCV: 91.9 fL (ref 78.0–100.0)
Platelets: 56 10*3/uL — ABNORMAL LOW (ref 150–400)
RDW: 19.5 % — ABNORMAL HIGH (ref 11.5–15.5)
WBC: 5 10*3/uL (ref 4.0–10.5)

## 2012-01-25 NOTE — Progress Notes (Signed)
CBI has been running at a slow rate throughout the day, urine pink/red and clear with some clots. At 1700 foley stopped draining, I hand irrigated as ordered aspirating numerous moderate size clots. Foley is now draining pink/red clear urine with CBI at a slow/moderate rate. Continuing to monitor.

## 2012-01-25 NOTE — Progress Notes (Signed)
Pt CBI going moderately slow throughout entire night. Urine light pink-red. Around 0400 pt began to c/o of urge to void; hand irrigated pt several times, pulled of a copious amount of moderate sized clots and several small clots. Pt now feeling comfortable, and not c/o of any pain. Will continue to monitor pt.

## 2012-01-25 NOTE — Progress Notes (Signed)
4 Days Post-Op Subjective: Patient reports having required an episode of bladder irrigation for clots. He is currently without complaint.  Objective: Vital signs in last 24 hours: Temp:  [98 F (36.7 C)-98.4 F (36.9 C)] 98 F (36.7 C) (10/29 0554) Pulse Rate:  [71-104] 71  (10/29 0554) Resp:  [18] 18  (10/29 0554) BP: (122-152)/(66-89) 142/89 mmHg (10/29 0554) SpO2:  [94 %-97 %] 94 % (10/29 0554) Weight:  [86.7 kg (191 lb 2.2 oz)] 86.7 kg (191 lb 2.2 oz) (10/29 0451)  Intake/Output from previous day: 10/28 0701 - 10/29 0700 In: 40981 [P.O.:1440; I.V.:1139.5; Blood:387.5; IV Piggyback:100] Out: 19147 [Urine:13500] Intake/Output this shift:    Physical Exam:  His abdomen is flat and soft. His Foley catheter is in place and is off traction at this time. It is draining slightly pink urine with the CBI running slowly.  Lab Results:  Basename 01/25/12 0156 01/24/12 1714 01/24/12 0845  HGB 7.3*7.3* 8.3* 7.1*  HCT 21.7*21.5* 24.9* 21.5*   BMET  Basename 01/24/12 0155 01/23/12 0120  NA 136 133*  K 4.6 5.1  CL 111 107  CO2 17* 17*  GLUCOSE 99 124*  BUN 34* 39*  CREATININE 2.55* 2.89*  CALCIUM 7.0* 7.0*   No results found for this basename: LABPT:3,INR:3 in the last 72 hours No results found for this basename: LABURIN:1 in the last 72 hours Results for orders placed during the hospital encounter of 01/19/12  SURGICAL PCR SCREEN     Status: Normal   Collection Time   01/19/12 10:54 AM      Component Value Range Status Comment   MRSA, PCR NEGATIVE  NEGATIVE Final    Staphylococcus aureus NEGATIVE  NEGATIVE Final     Studies/Results: No results found.  Assessment/Plan: He overall is doing well however continues to have oozing from the prostate. At the time of his surgery all active bleeding was controlled with electrocautery. I suspect this is venous oozing. I have reapplied mild traction to his catheter after removing fluid from the balloon yesterday. These may be old  clots in his bladder that had been irrigated out. If this should persist I would consider taking him back to the operating room however my concern is that attempt at cauterization of venous bleeding is often quite challenging since actual bleeding points are very difficult to visualize. It seems as if the prostate bleeding is becoming less over time although he has required several transfusions. Initially the risk of transfusions would appear to out weigh the risk of a second surgery. He was found to have a normal INR but continues to have low platelet level and certainly may benefit from further platelet transfusions.  I replaced traction on his Foley catheter this morning.  I have continued the CBI and he may require an irrigation if there are any further old clots in his bladder.  I am going to transfuse further platelets today.  He is certainly not ready for discharge yet.   LOS: 4 days   Abu Heavin C 01/25/2012, 7:26 AM

## 2012-01-25 NOTE — Progress Notes (Signed)
[facility] CONSULT PROGRESS NOTE  TRAMPAS STETTNER AVW:[id number] DOB: 1919-05-05 DOA: 01/21/2012 PCP: [name], MD  Brief narrative: Mr. Tsosie is a 78 year old man with a PMH of MDS, thrombocytopenia, CKD, CAD, atrial fibrillation (not currently on coumadin) who was admitted to the hospital by urologist on 01/20/12 for TURP.  He had significant bleeding into the foley post procedure with a recent hemoglobin of 6.5 mg/dL which prompted the consultation request.  He has required PRBCs and platelet transfusions in the past.  The patient prefers that hospitalists who are not familiar with him not be involved in his care, although he was very respectful of my being on his case.  Since we are changing physicians 01/26/12, which will introduce another provider into his care, I have requested consultation with Dr. [name], who knows the patient, to consult regarding his hematological issues.  I will sign off for now.  If [facility] can help further, please call our flow manager at [phone] to request re-consultation.  Assessment/Plan: Principal Problem:  *Anemia associated with acute blood loss  Given 3 units of blood and one unit of platelets 01/22/12, 1unit of PRBCs on 01/23/12, and 1 unit on 01/24/12.  For a unit of platelets today.  Continue serial hemoglobin and hematocrit checks.  Monitor closely for fluid overload. Active Problems:  Elevated proBNP / chronic systolic and diastolic CHF  2 D Echo done 12/26/11.  EF 50%.  Chronic atrial fibrillation/diastolic dysfunction.    Well compensated clinically.  KVO IVF.  Essential hypertension, benign  Continue metoprolol and Imdur resumed.     Atrial fibrillation  Rate currently controlled. Not on anticoagulation.  CKD (chronic kidney disease), stage IV  Creatinine stable.  MDS (myelodysplastic syndrome)  Chronic anemia and thrombocytopenia noted.  Consulted Dr. [name] to assist with care, given ongoing bleeding.  Hyperkalemia  Corrected with Lasix.  Code Status: Full Family Communication: Wife at bedside. Disposition Plan: Per primary team.  Possibly home in next 24-48 hours.  HPI/Subjective: Mr. Fullington had a bad night.  He was up most of the night with interruptions from the nursing staff for blood work, then had painful bladder spasms requiring bladder irrigation of clots.  He still reports penile bleeding.  Objective: Filed Vitals:   01/25/12 0451 01/25/12 0554 01/25/12 0937 01/25/12 1243  BP:  142/89 127/67 123/69  Pulse:  71 85 77  Temp:  98 F (36.7 C) 98.3 F (36.8 C) 97.4 F (36.3 C)  TempSrc:  Oral Oral Axillary  Resp:  18 18 18   Height:      Weight: 86.7 kg (191 lb 2.2 oz)     SpO2:  94% 96%     Intake/Output Summary (Last 24 hours) at 01/25/12 1346 Last data filed at 01/25/12 1100  Gross per 24 hour  Intake 13269.5 ml  Output  30865 ml  Net 2019.5 ml    Exam: Gen:  NAD. Cardiovascular:  HSIR Respiratory:  Lungs CTAB Gastrointestinal:  Abdomen soft, NT/ND, + BS Extremities:  No C/E/C  Data Reviewed: Basic Metabolic Panel:  Lab 01/24/12 7846 01/23/12 0120 01/19/12 1130  NA 136 133* 134*  K 4.6 5.1 --  CL 111 107 104  CO2 17* 17* 19  GLUCOSE 99 124* 120*  BUN 34* 39* 38*  CREATININE 2.55* 2.89* 2.85*  CALCIUM 7.0* 7.0* 9.0  MG -- -- --  PHOS -- -- --   GFR Estimated Creatinine Clearance: 21.8 ml/min (by C-G formula based on Cr of 2.55). Coagulation profile  Lab 01/22/12 0027 01/19/12 1130  INR 1.41 1.27  PROTIME -- --    CBC:  Lab 01/25/12 0830 01/25/12 0156 01/24/12 1714 01/24/12 0845 01/24/12 0155 01/19/12 1130  WBC -- 5.0 -- -- -- 5.3  NEUTROABS -- -- -- -- -- --  HGB 7.9* 7.3*7.3* 8.3* 7.1* 7.0* --  HCT 24.0* 21.7*21.5* 24.9* 21.5* 20.9* --  MCV -- 91.9 -- -- -- 97.4  PLT -- 56* -- -- -- 48*   BNP (last 3 results)  Basename 01/22/12 2200 12/08/11 1600 08/04/11 1241  PROBNP 16466.0* 9654.0* 601.0*   Microbiology Recent Results (from  the past 240 hour(s))  SURGICAL PCR SCREEN     Status: Normal   Collection Time   01/19/12 10:54 AM      Component Value Range Status Comment   MRSA, PCR NEGATIVE  NEGATIVE Final    Staphylococcus aureus NEGATIVE  NEGATIVE Final      Procedures and Diagnostic Studies:  Dg Chest 1 View 01/22/2012 IMPRESSION: Cardiac enlargement with increasing pulmonary vascular congestion and perihilar edema.  Small bilateral pleural effusions and basilar atelectasis or infiltration.   Original Report Authenticated By: Marlon Pel, M.D.     Dg Chest 2 View 01/21/2012 IMPRESSION: Cardiomegaly.  Interstitial edema.  Effusions left larger than right.  Density in the left lower lung could be due to atelectasis or pneumonia.   Original Report Authenticated By: Thomasenia Sales, M.D.     Dg Chest 2 View 01/19/2012 IMPRESSION:  1.  Residual or recurrent left lower lobe airspace disease.  Favor infection or aspiration. Recommend radiographic follow-up until clearing. 2.  Small bilateral pleural effusions.  New on the right and new or increased on the left. 3.  Cardiomegaly with mild pulmonary venous congestion.  These results will be called to the ordering clinician or representative by the Radiologist Assistant, and communication documented in the PACS Dashboard.   Original Report Authenticated By: Consuello Bossier, M.D.    Scheduled Meds:    .  ceFAZolin (ANCEF) IV  1 g Intravenous Q12H  . isosorbide mononitrate  30 mg Oral QPM  . metoprolol  50 mg Oral BID   Continuous Infusions:    . sodium chloride 10 mL/hr (01/24/12 1752)    Time spent: 35 minutes.   LOS: 4 days   Anniah Glick  [facility] Pager [phone].  If 8PM-8AM, please contact night-coverage at [email or web address], password [facility] 01/25/2012, 1:46 PM

## 2012-01-26 DIAGNOSIS — D696 Thrombocytopenia, unspecified: Secondary | ICD-10-CM

## 2012-01-26 DIAGNOSIS — R319 Hematuria, unspecified: Secondary | ICD-10-CM

## 2012-01-26 LAB — HEMOGLOBIN AND HEMATOCRIT, BLOOD
HCT: 21.3 % — ABNORMAL LOW (ref 39.0–52.0)
HCT: 23.8 % — ABNORMAL LOW (ref 39.0–52.0)
HCT: 23.2 % — ABNORMAL LOW (ref 39.0–52.0)
Hemoglobin: 7 g/dL — ABNORMAL LOW (ref 13.0–17.0)
Hemoglobin: 7.7 g/dL — ABNORMAL LOW (ref 13.0–17.0)
Hemoglobin: 7.5 g/dL — ABNORMAL LOW (ref 13.0–17.0)

## 2012-01-26 LAB — PREPARE PLATELET PHERESIS
Unit division: 0
Unit division: 0

## 2012-01-26 MED ORDER — AMINOCAPROIC ACID 500 MG PO TABS
500.0000 mg | ORAL_TABLET | Freq: Three times a day (TID) | ORAL | Status: DC
  Administered 2012-01-26 – 2012-01-31 (×13): 500 mg via ORAL
  Filled 2012-01-26 (×20): qty 1

## 2012-01-26 NOTE — Progress Notes (Signed)
Subjective: The patient is seen and examined today. He is well-known to me with history of myelodysplastic syndrome and pancytopenia. He was admitted on 01/20/2012 to [facility] for TURP by his urologist Dr. Vernie Ammons. This procedure was complicated by significant hematuria and to both his hemoglobin to 6.5mg /dL. He received 5 units of packed rbc's transfusion as well as 2 units of platelet transfusion. Hemoglobin earlier today was 7.7 and hematocrit 23.8%. Platelets count yesterday was 56,000. I was consulted to evaluate his current bleeding issues. The patient is currently off Coumadin.   Objective: Vital signs in last 24 hours: Temp:  [97.7 F (36.5 C)-98.4 F (36.9 C)] 97.7 F (36.5 C) (10/30 0441) Pulse Rate:  [74-90] 77  (10/30 0441) Resp:  [16-18] 17  (10/30 0441) BP: (116-162)/(49-89) 151/87 mmHg (10/30 0441) SpO2:  [94 %-97 %] 94 % (10/30 0441) Weight:  [184 lb 14.4 oz (83.87 kg)] 184 lb 14.4 oz (83.87 kg) (10/30 0441)  Intake/Output from previous day: 10/29 0701 - 10/30 0700 In: 16109 [P.O.:1200; I.V.:480; Blood:212; IV Piggyback:100] Out: 8750 [Urine:8750] Intake/Output this shift: Total I/O In: 50 [IV Piggyback:50] Out: 200 [Urine:200]  General appearance: alert, cooperative and no distress Resp: clear to auscultation bilaterally Cardio: regular rate and rhythm, S1, S2 normal, no murmur, click, rub or gallop GI: soft, non-tender; bowel sounds normal; no masses,  no organomegaly Extremities: extremities normal, atraumatic, no cyanosis or edema  Lab Results:   Basename 01/26/12 0835 01/26/12 0135 01/25/12 0156  WBC -- -- 5.0  HGB 7.7* 7.0* --  HCT 23.8* 21.3* --  PLT -- -- 56*   BMET  Basename 01/24/12 0155  NA 136  K 4.6  CL 111  CO2 17*  GLUCOSE 99  BUN 34*  CREATININE 2.55*  CALCIUM 7.0*    Studies/Results: No results found.  Medications: I have reviewed the patient's current medications.  Assessment/Plan: This is a very pleasant 78 years  old white male with history of myelodysplastic syndrome and pancytopenia. He is currently presenting with hematuria secondary to his recent TURP procedure. His platelets count yesterday was reasonable at 56,000 after transfusion. His hemoglobin and hematocrit are stable today. I will continue to monitor him closely with transfusion of packed RBCs if his hemoglobin is less than 7.0 g/dL or platelets count less than 20,000 or if he has any significant uncontrolled bleeding. We may consider the patient also for treatment with Amicar until his hematoma is improved. I will continue to follow the patient with you and assist in his management on as-needed basis.  LOS: 5 days    Dymon Summerhill K. 01/26/2012

## 2012-01-26 NOTE — Progress Notes (Signed)
5 Days Post-Op Subjective: Patient reports he had a non-eventful night. Specifically no incidence of catheter obstruction by clots. No complaints were voiced this morning.  Objective: Vital signs in last 24 hours: Temp:  [97.4 F (36.3 C)-98.4 F (36.9 C)] 97.7 F (36.5 C) (10/30 0441) Pulse Rate:  [74-90] 77  (10/30 0441) Resp:  [16-18] 17  (10/30 0441) BP: (116-162)/(49-89) 151/87 mmHg (10/30 0441) SpO2:  [94 %-97 %] 94 % (10/30 0441) Weight:  [83.87 kg (184 lb 14.4 oz)] 83.87 kg (184 lb 14.4 oz) (10/30 0441)  Intake/Output from previous day: 10/29 0701 - 10/30 0700 In: 96045 [P.O.:1200; I.V.:290; Blood:212; IV Piggyback:50] Out: 7750 [Urine:7750] Intake/Output this shift: Total I/O In: 6000 [Other:6000] Out: 3600 [Urine:3600]  Physical Exam:  No change  Lab Results:  Basename 01/26/12 0135 01/25/12 1700 01/25/12 0830  HGB 7.0* 8.0* 7.9*  HCT 21.3* 24.2* 24.0*   BMET  Basename 01/24/12 0155  NA 136  K 4.6  CL 111  CO2 17*  GLUCOSE 99  BUN 34*  CREATININE 2.55*  CALCIUM 7.0*   No results found for this basename: LABPT:3,INR:3 in the last 72 hours No results found for this basename: LABURIN:1 in the last 72 hours Results for orders placed during the hospital encounter of 01/19/12  SURGICAL PCR SCREEN     Status: Normal   Collection Time   01/19/12 10:54 AM      Component Value Range Status Comment   MRSA, PCR NEGATIVE  NEGATIVE Final    Staphylococcus aureus NEGATIVE  NEGATIVE Final     Studies/Results: No results found.  Assessment/Plan: He received 2 sixpacks of platelets yesterday. He does appear to have less hematuria. He did have some bleeding around the catheter. I am going to try to stop his CBI today with intermittent irrigation to see if he could possibly get his catheter out either this afternoon or tomorrow.  Continue to follow up CBC and transfuse RBCs as needed.  I will try to stop his CBI today and have asked his nurse to intermittently  irrigate the bladder with his CBI.   LOS: 5 days   Urvi Imes C 01/26/2012, 6:46 AM

## 2012-01-27 ENCOUNTER — Other Ambulatory Visit: Payer: Self-pay | Admitting: Urology

## 2012-01-27 ENCOUNTER — Encounter (HOSPITAL_COMMUNITY): Payer: Medicare Other

## 2012-01-27 ENCOUNTER — Inpatient Hospital Stay (HOSPITAL_COMMUNITY): Admission: RE | Admit: 2012-01-27 | Payer: Medicare Other | Source: Ambulatory Visit

## 2012-01-27 ENCOUNTER — Encounter (HOSPITAL_COMMUNITY): Admission: RE | Admit: 2012-01-27 | Payer: Medicare Other | Source: Ambulatory Visit

## 2012-01-27 LAB — HEMOGLOBIN AND HEMATOCRIT, BLOOD
HCT: 21.8 % — ABNORMAL LOW (ref 39.0–52.0)
HCT: 22.3 % — ABNORMAL LOW (ref 39.0–52.0)
HCT: 21.8 % — ABNORMAL LOW (ref 39.0–52.0)
Hemoglobin: 7.1 g/dL — ABNORMAL LOW (ref 13.0–17.0)
Hemoglobin: 7.2 g/dL — ABNORMAL LOW (ref 13.0–17.0)
Hemoglobin: 7.1 g/dL — ABNORMAL LOW (ref 13.0–17.0)

## 2012-01-27 NOTE — Progress Notes (Signed)
Patient ID: [name], male   DOB: 09-05-1919, 78 y.o.   MRN: [id number] 6 Days Post-Op Subjective: Patient is without complaint.  Objective: Vital signs in last 24 hours: Temp:  [98.1 F (36.7 C)-98.3 F (36.8 C)] 98.1 F (36.7 C) (10/31 0526) Pulse Rate:  [70-94] 70  (10/31 0526) Resp:  [16] 16  (10/31 0526) BP: (129-143)/(64-77) 129/64 mmHg (10/31 0526) SpO2:  [93 %-96 %] 93 % (10/31 0526) Weight:  [83.235 kg (183 lb 8 oz)] 83.235 kg (183 lb 8 oz) (10/31 0526)  Intake/Output from previous day: 10/30 0701 - 10/31 0700 In: 760 [I.V.:110; IV Piggyback:50] Out: 2800 [Urine:2800] Intake/Output this shift:    Past Medical History  Diagnosis Date  . CAD (coronary artery disease)     status post prior PCI to the obtuse marginal in 1997 and stenting to the mid to distal RCA in 2001; LHC 9/06:  LM ok, pLAD 50%, mLAD 60% then 60-70%, mRI 50%, mOM1 80-90% (small), pRCA and dRCA stents ok, PDA 50%, EF 65%;  Myoview 5/12: No ischemia, EF 64%.;  NSTEMI 11/12 tx medically   . Chronic kidney disease, stage III (moderate)   . TIA (transient ischemic attack)   . HTN (hypertension)   . Hyperlipidemia   . AF (atrial fibrillation) 02/16/11    Coumadin ==> patient decided to stop after admxn with worsening anemia in setting of UGI bleed, epistaxis  . Arthritis     "in my left ankle"  . Chronic diastolic heart failure   . Carotid stenosis     dopplers 03/2011: 0-39% bilateral  . MDS (myelodysplastic syndrome) 12/16/2011  . Hx of echocardiogram     Echocardiogram 02/17/11: Severe LVH, asymmetric hypertrophy, EF 50-55%, normal wall motion, high ventricular filling pressure, mild LAE, mild RVE, mildly reduced RVSF, mild RAE.  Marland Kitchen Myocardial infarction 2012  . Stroke     5 yrs ago.  Mini - NO RESIDUAL PROBLEMS  . Anemia of chronic disease     RECENT HOSPITALIZATION / BLOOD TRANSFUSION OCT 2013  . Shortness of breath     WITH ANY ACTIVITY---  NO OXYGEN  . CHF (congestive heart failure)    CHRONIC DIASTOLIC HEART FAILURE   Current Facility-Administered Medications  Medication Dose Route Frequency Provider Last Rate Last Dose  . 0.9 %  sodium chloride infusion   Intravenous Continuous Maryruth Bun Rama, MD 10 mL/hr at 01/24/12 1752 10 mL/hr at 01/24/12 1752  . acetaminophen (TYLENOL) tablet 650 mg  650 mg Oral Q4H PRN [name], MD   650 mg at 01/22/12 0734  . aminocaproic acid (AMICAR) tablet 500 mg  500 mg Oral Q8H Si Gaul, MD   500 mg at 01/26/12 2148  . ceFAZolin (ANCEF) IVPB 1 g/50 mL premix  1 g Intravenous Q12H Maryanna Shape Runyon, PHARMD   1 g at 01/26/12 2148  . HYDROcodone-acetaminophen (NORCO/VICODIN) 5-325 MG per tablet 1-2 tablet  1-2 tablet Oral Q4H PRN [name], MD   2 tablet at 01/26/12 2148  . isosorbide mononitrate (IMDUR) 24 hr tablet 30 mg  30 mg Oral QPM Maryruth Bun Rama, MD   30 mg at 01/26/12 1632  . metoprolol (LOPRESSOR) tablet 50 mg  50 mg Oral BID Maryruth Bun Rama, MD   50 mg at 01/26/12 2148  . neomycin-bacitracin-polymyxin (NEOSPORIN) ointment 1 application  1 application Topical TID PRN [name], MD      . ondansetron [facility]) injection 4 mg  4 mg Intravenous Q4H PRN  [name], MD        Physical Exam:  General: Patient is in no apparent distress Lungs: Normal respiratory effort, chest expands symmetrically. GI: The abdomen is soft and nontender without mass. Catheter: Draining slightly pink urine without clots    Lab Results:  Basename 01/27/12 0200 01/26/12 1657 01/26/12 0835 01/25/12 0156  WBC -- -- -- 5.0  HGB 7.1* 7.5* 7.7* --  HCT 21.8* 23.2* 23.8* --   BMET No results found for this basename: NA:2,K:2,CL:2,CO2:2,GLUCOSE:2,BUN:2,CREATININE:2,CALCIUM:2 in the last 72 hours No results found for this basename: LABPT:3,INR:3 in the last 72 hours No results found for this basename: LABURIN:1 in the last 72 hours Results for orders placed during the hospital encounter of 01/19/12  SURGICAL PCR SCREEN     Status:  Normal   Collection Time   01/19/12 10:54 AM      Component Value Range Status Comment   MRSA, PCR NEGATIVE  NEGATIVE Final    Staphylococcus aureus NEGATIVE  NEGATIVE Final     Studies/Results: @RISRSLT24 @  Assessment/Plan: He has now been off CBI for 24 hours. Although his urine is still blood tinged am going to remove his catheter today. We discussed the fact that he may still see some hematuria. He is going to force fluids today and if he has no difficulties with urination then I would consider discharge tomorrow. If on the other hand he continues to have bleeding and clots and requires the replacement of the catheter I have discussed taking him back to the operating room tomorrow and attempting to fulgurate any bleeding points. He is in agreement with that plan.  1. DC Foley catheter. 2. Force fluids. 3. Replace Foley if patient unable to void/clot retention.  Zaria Taha C 01/27/2012, 7:06 AM

## 2012-01-27 NOTE — Evaluation (Signed)
Physical Therapy Evaluation Patient Details Name: Tracy [name] MRN: [id number] DOB: [birth year] Today's Date: 01/27/2012 Time: 3086-5784 PT Time Calculation (min): 32 min  PT Assessment / Plan / Recommendation Clinical Impression  78 y.o. male with h/o L TKA, a-fib, TIA, myelodysplastic syndrome admitted for TURP which was performed 01/21/12. Pt reports that at baseline he walks short distances with a straight cane, that he has difficulty ascending 13 steps to bedroom at home due to fatigue. He states MD is working on an order for stair lift. Pt ambulated 400' today with 3 standing rest breaks due to fatigue. Pt appears to be near baseline with mobility. He would benefit from acute PT to maximize safety and independence with mobility.     PT Assessment  Patient needs continued PT services    Follow Up Recommendations  No PT follow up    Does the patient have the potential to tolerate intense rehabilitation      Barriers to Discharge None      Equipment Recommendations  None recommended by PT    Recommendations for Other Services     Frequency Min 3X/week    Precautions / Restrictions Precautions Precautions: None Restrictions Weight Bearing Restrictions: No   Pertinent Vitals/Pain *Pt denies pain Pt having active bleeding from penis, nursing aware**      Mobility  Bed Mobility Bed Mobility: Supine to Sit Supine to Sit: 6: Modified independent (Device/Increase time);HOB elevated;With rails Sitting - Scoot to Edge of Bed: 6: Modified independent (Device/Increase time);With rail Transfers Transfers: Sit to Stand;Stand to Sit Sit to Stand: 6: Modified independent (Device/Increase time);From bed;With upper extremity assist Stand to Sit: To bed;6: Modified independent (Device/Increase time);With upper extremity assist Details for Transfer Assistance: Pt. able to manage sit <> stand today without difficulty at mod I level Ambulation/Gait Ambulation/Gait Assistance: 5:  Supervision Ambulation Distance (Feet): 400 Feet (with 3 standing rest breaks of approx. 2 min each) Assistive device: Other (Comment) (IV pole and handrails) Gait Pattern: Within Functional Limits Gait velocity: slowed pace due to feeling tired General Gait Details: no LOB, pt took 3 standing rest breaks due to 2/4 dyspnea which he stated is normal for him since having heart attack last year Stairs: No    Shoulder Instructions     Exercises     PT Diagnosis: Generalized weakness  PT Problem List: Decreased activity tolerance PT Treatment Interventions: Gait training;Therapeutic exercise;Patient/family education;Stair training   PT Goals Acute Rehab PT Goals PT Goal Formulation: With patient Time For Goal Achievement: 02/03/12 Potential to Achieve Goals: Good Pt will Ambulate: >150 feet;with modified independence;with least restrictive assistive device PT Goal: Ambulate - Progress: Goal set today Pt will Go Up / Down Stairs: Flight;with modified independence;with least restrictive assistive device PT Goal: Up/Down Stairs - Progress: Goal set today  Visit Information  Last PT Received On: 01/27/12 Assistance Needed: +1    Subjective Data  Subjective: I can walk if I hold onto the handrails.  Patient Stated Goal: return home   Prior Functioning  Home Living Lives With: Spouse Available Help at Discharge: Family;Available 24 hours/day Type of Home: House Home Access: Stairs to enter [employer] of Steps: 2 Entrance Stairs-Rails: Right Home Layout: Two level;Bed/bath upstairs Alternate Level Stairs-Number of Steps: 13 Alternate Level Stairs-Rails: Right Bathroom Shower/Tub: Engineer, manufacturing systems: Standard Home Adaptive Equipment: Straight cane;Tub transfer bench Prior Function Level of Independence: Independent with assistive device(s) (used SPC PTA) Able to Take Stairs?: Yes (pt has to stop to rest to  get to 2nd floor, wants stair lift) Driving:  Yes Vocation: Retired Comments: retired Nurse, 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 for tasks assessed Left Upper Extremity Assessment LUE ROM/Strength/Tone: WFL for tasks assessed Right Lower Extremity Assessment RLE ROM/Strength/Tone: Within functional levels RLE Sensation: WFL - Light Touch RLE Coordination: WFL - gross/fine motor Left Lower Extremity Assessment LLE ROM/Strength/Tone: Within functional levels LLE Sensation: WFL - Light Touch LLE Coordination: WFL - gross/fine motor Trunk Assessment Trunk Assessment: Normal   Balance Balance Balance Assessed: Yes Static Sitting Balance Static Sitting - Balance Support: Feet supported;No upper extremity supported Static Sitting - Level of Assistance: 7: Independent Static Sitting - Comment/# of Minutes: 2  End of Session PT - End of Session Equipment Utilized During Treatment: Gait belt Activity Tolerance: Patient limited by fatigue Patient left: with call bell/phone within reach;in bed Nurse Communication: Mobility status  GP     Ralene Bathe Kistler 01/27/2012, 11:05 AM  [phone]

## 2012-01-27 NOTE — Progress Notes (Signed)
Foley removed at change of shift this am ~0700. Pt has not voided and complains of urgency to void but cannot. Bladder scan done 215cc noted. Dr. Vernie Ammons made aware. Foley placed at 1430 74F and CBI as orderd. Output750cc with small clots dark bloody urine. CBI infusing at this time without difficulty. Urine is pink tinged in color and clear. Patient resting in bed. Will continue to monitor.

## 2012-01-27 NOTE — Progress Notes (Signed)
Mr. Loadholt had his catheter removed this morning and by midday he was unable to void and began to become uncomfortable. A bladder scan was performed and revealed an elevated at 215 cc. A 22 French Foley catheter was reinserted with 750 cc out. He was hooked back up to CBI.  I see that Dr. Sofie Hartigan has started him on Amicar and I have discussed with the patient the fact that because he has had difficulty with continued bleeding I would recommend repeat cystoscopy and fulguration of any bleeders that I could find as well as evacuation of all clots. My hope is that the bleeding might stop overnight especially with the Amicar although I have told him that if it does not we would proceed tomorrow afternoon back to surgery. He is in agreement with that.  I have discussed the planned procedure with the patient as well as its potential risks and complications and the alternatives. We also discussed the fact that there is a possibility that this may not be successful although I feel it is necessary at this point as conservative management does not appear to have succeeded.  I will reevaluate him in the morning and make a final determination at that time as to whether I will take him back to the OR or not.

## 2012-01-28 ENCOUNTER — Encounter (HOSPITAL_COMMUNITY)
Admission: RE | Disposition: A | Discharge status: Home / Self Care | Payer: Self-pay | Source: Ambulatory Visit | Attending: Urology

## 2012-01-28 ENCOUNTER — Encounter (HOSPITAL_COMMUNITY): Payer: Self-pay | Admitting: Anesthesiology

## 2012-01-28 ENCOUNTER — Inpatient Hospital Stay (HOSPITAL_COMMUNITY): Payer: Medicare Other | Admitting: Anesthesiology

## 2012-01-28 HISTORY — PX: CYSTOSCOPY: SHX5120

## 2012-01-28 HISTORY — PX: HEMATOMA EVACUATION: SHX5118

## 2012-01-28 LAB — HEMOGLOBIN AND HEMATOCRIT, BLOOD
HCT: 21.6 % — ABNORMAL LOW (ref 39.0–52.0)
HCT: 24.1 % — ABNORMAL LOW (ref 39.0–52.0)
HCT: 22.8 % — ABNORMAL LOW (ref 39.0–52.0)
Hemoglobin: 6.9 g/dL — CL (ref 13.0–17.0)
Hemoglobin: 7.5 g/dL — ABNORMAL LOW (ref 13.0–17.0)
Hemoglobin: 7.1 g/dL — ABNORMAL LOW (ref 13.0–17.0)

## 2012-01-28 SURGERY — CYSTOSCOPY
Anesthesia: General | Site: Penis | Wound class: Clean Contaminated

## 2012-01-28 MED ORDER — SODIUM CHLORIDE 0.9 % IV SOLN
5.0000 g | Freq: Once | INTRAVENOUS | Status: DC
  Administered 2012-01-28: 5 g via INTRAVENOUS
  Filled 2012-01-28: qty 20

## 2012-01-28 MED ORDER — SODIUM CHLORIDE 0.9 % IV SOLN
1.0000 g/h | INTRAVENOUS | Status: DC
  Administered 2012-01-28 – 2012-01-30 (×4): 1 g/h via INTRAVENOUS
  Filled 2012-01-28 (×15): qty 40

## 2012-01-28 MED ORDER — STERILE WATER FOR IRRIGATION IR SOLN
Status: DC | PRN
  Administered 2012-01-28 (×2): 3000 mL via INTRAVESICAL

## 2012-01-28 MED ORDER — FENTANYL CITRATE 0.05 MG/ML IJ SOLN: 25.0000 ug | INTRAMUSCULAR | Status: DC | PRN

## 2012-01-28 MED ORDER — PROMETHAZINE HCL 25 MG/ML IJ SOLN: 6.2500 mg | INTRAMUSCULAR | Status: DC | PRN

## 2012-01-28 MED ORDER — FENTANYL CITRATE 0.05 MG/ML IJ SOLN
INTRAMUSCULAR | Status: DC | PRN
  Administered 2012-01-28: 25 ug via INTRAVENOUS
  Administered 2012-01-28: 50 ug via INTRAVENOUS

## 2012-01-28 MED ORDER — ONDANSETRON HCL 4 MG/2ML IJ SOLN
INTRAMUSCULAR | Status: DC | PRN
  Administered 2012-01-28: 4 mg via INTRAVENOUS

## 2012-01-28 MED ORDER — SODIUM CHLORIDE 0.9 % IR SOLN
3000.0000 mL | Status: DC
  Administered 2012-01-28: 3000 mL

## 2012-01-28 MED ORDER — PROPOFOL 10 MG/ML IV BOLUS
INTRAVENOUS | Status: DC | PRN
  Administered 2012-01-28: 120 mg via INTRAVENOUS

## 2012-01-28 MED ORDER — EPHEDRINE SULFATE 50 MG/ML IJ SOLN
INTRAMUSCULAR | Status: DC | PRN
  Administered 2012-01-28: 5 mg via INTRAVENOUS

## 2012-01-28 SURGICAL SUPPLY — 11 items
BAG URO CATCHER STRL LF (DRAPE) ×2 IMPLANT
CATH ROBINSON RED A/P 16FR (CATHETERS) IMPLANT
CLOTH BEACON ORANGE TIMEOUT ST (SAFETY) ×2 IMPLANT
DRAPE CAMERA CLOSED 9X96 (DRAPES) ×2 IMPLANT
GLOVE BIOGEL M 8.0 STRL (GLOVE) ×2 IMPLANT
GOWN PREVENTION PLUS XLARGE (GOWN DISPOSABLE) ×3 IMPLANT
GOWN STRL REIN XL XLG (GOWN DISPOSABLE) ×3 IMPLANT
MANIFOLD NEPTUNE II (INSTRUMENTS) ×2 IMPLANT
PACK CYSTO (CUSTOM PROCEDURE TRAY) ×2 IMPLANT
SYRINGE IRR TOOMEY STRL 70CC (SYRINGE) ×1 IMPLANT
TUBING CONNECTING 10 (TUBING) ×3 IMPLANT

## 2012-01-28 NOTE — Preoperative (Signed)
Beta Blockers   Reason not to administer Beta Blockers:Not Applicable 

## 2012-01-28 NOTE — Progress Notes (Signed)
PT Cancellation Note  Patient Details Name: Tracy [name] MRN: [id number] DOB: [birth year]   Cancelled Treatment:     Attempted PT tx session. Pt currently out of room/having procedure. Will check back another day. Thanks.    Rebeca Alert [facility] 01/28/2012, 2:47 PM [phone]

## 2012-01-28 NOTE — Anesthesia Preprocedure Evaluation (Addendum)
Anesthesia Evaluation  Patient identified by MRN, date of birth, ID band Patient awake    Reviewed: Allergy & Precautions, H&P , NPO status , Patient's Chart, lab work & pertinent test results  Airway Mallampati: II TM Distance: >3 FB Neck ROM: Full    Dental No notable dental hx.    Pulmonary shortness of breath,  breath sounds clear to auscultation  Pulmonary exam normal       Cardiovascular hypertension, + CAD, + Past MI and +CHF + dysrhythmias Atrial Fibrillation Rhythm:Regular Rate:Normal  Anemia s/p blood loss. H/O low platelets.  ECHO reviewed.   Neuro/Psych TIACVA negative psych ROS   GI/Hepatic negative GI ROS, Neg liver ROS,   Endo/Other  negative endocrine ROS  Renal/GU Renal diseaseChronic kidney disease, state 4.  negative genitourinary   Musculoskeletal negative musculoskeletal ROS (+)   Abdominal   Peds negative pediatric ROS (+)  Hematology negative hematology ROS (+)   Anesthesia Other Findings   Reproductive/Obstetrics negative OB ROS                          Anesthesia Physical Anesthesia Plan  ASA: IV  Anesthesia Plan: General   Post-op Pain Management:    Induction: Intravenous  Airway Management Planned: LMA  Additional Equipment:   Intra-op Plan:   Post-operative Plan: Extubation in OR  Informed Consent: I have reviewed the patients History and Physical, chart, labs and discussed the procedure including the risks, benefits and alternatives for the proposed anesthesia with the patient or authorized representative who has indicated his/her understanding and acceptance.   Dental advisory given  Plan Discussed with: CRNA  Anesthesia Plan Comments:         Anesthesia Quick Evaluation

## 2012-01-28 NOTE — Discharge Summary (Signed)
Physician Discharge Summary  Patient ID: Tracy [name] MRN: [id number] DOB/AGE: 04-12-1919 78 y.o.  Admit date: 01/21/2012 Discharge date: 01/29/12  Admission Diagnoses: Benign Prostatic Hypertrophy with Outlet Obstruction gross hematuria  Discharge Diagnoses:  Principal Problem:  *Anemia associated with acute blood loss Active Problems:  Essential hypertension, benign  Atrial fibrillation  CKD (chronic kidney disease), stage IV  MDS (myelodysplastic syndrome)  Hyperkalemia  Elevated brain natriuretic peptide (BNP) level  Chronic combined systolic and diastolic CHF (congestive heart failure)   Discharged Condition: good  Hospital Course: The patient was admitted for elective transurethral resection of his prostate due to outlet obstruction. He was taken to the operating room and underwent this procedure without complication. There was extremely little bleeding at the time of his procedure which was fortunate because he has a history of thrombocytopenia. At the end of the procedure there was no bleeding noted and I placed a three-way Foley catheter and initiated continuous bladder irrigation. Over the ensuing days he continued to have persistent gross hematuria despite multiple maneuvers such as placing the catheter on traction, relieving traction, vigorous bladder irrigation to free it of all clots and continuous bladder irrigation to prevent clots from forming within the bladder. Despite all these attempts the patient continued to have slow bleeding with hematuria requiring several transfusions of red blood cells. He underwent a transfusion of a single sixpack of platelets as well but really did not increase his platelet level significantly. I eventually transfused him with 2 more 6 packs of platelets and felt he was having decreased hematuria. I stopped his CBI and eventually remove his catheter however he was unable to void and required the catheter to be replaced. He was started on  low-dose Amicar and over the ensuing 24 hours he continued to have hematuria. I therefore elected to take him back to the operating room for evacuation of clots and fulguration of any active bleeding sites identified.  He underwent repeat cystoscopy and clot evacuation on 01/28/12. At the time of surgery I found no significant clots in the bladder other than a few old clots that were irrigated out. The prostatic fossa was lined with very tough, tenacious clot consistent with the type that develops with Amicar. I was able to identify what appeared to be too active bleeding points and fulgurated these. I reinserted a three-way Foley catheter and continued continuous bladder irrigation. My hope is that within the next 24-48 hours his catheter can be removed and he will be discharged home.In the meantime I have reinitiated Amicar and placed him on Catheter traction in order to Hopefully result in clearing of his urine.  Consults: hematology/oncology and the hospitalist service  Significant Diagnostic Studies: No results found.  Treatments: antibiotics: Ancef and Cipro, procedures: TURP and cystoscopy with clot evacuation and fulguration   Discharge Exam: Blood pressure 147/76, pulse 95, temperature 97.7 F (36.5 C), temperature source Oral, resp. rate 19, height 6\' 1"  (1.854 m), weight 83.4 kg (183 lb 13.8 oz), SpO2 96.00%.  Disposition: 01-Home or Self Care  Discharge Orders    Future Appointments: Provider: Department: Dept Phone: Center:   02/01/2012 1:30 PM [facility] [phone] None   02/03/2012 1:30 PM [facility] [phone] None   02/08/2012 1:30 PM [facility] [phone] None   02/09/2012 3:00 PM Beverely Pace Sh[facility] [phone] None   02/09/2012 3:30 PM Si Gaul, MD [facility] [phone] None   02/10/2012 1:30 PM [facility] [phone] None   02/15/2012 1:30 PM [facility] [phone] None   02/16/2012 11:45 AM [name], MD [facility] [phone] [facility]   02/17/2012 1:30 PM [facility] [phone] None   02/22/2012 1:30 PM [facility] [phone] None   02/24/2012 1:30 PM [facility] [phone] None   02/29/2012 1:30 PM [facility] [phone] None   03/02/2012 1:30 PM [facility] [phone] None   03/07/2012 1:30 PM [facility] [phone] None   03/09/2012 1:30 PM [facility] [phone] None   03/14/2012 1:30 PM [facility] [phone] None   03/16/2012 1:30 PM [facility] [phone] None   03/21/2012 1:30 PM [facility] [phone] None   03/23/2012 1:30 PM [facility] [phone] None   03/28/2012 1:30 PM [facility] [phone] None   03/30/2012 11:00 AM [facility] [phone] [facility]   03/30/2012 1:30 PM [facility] [phone] None   04/04/2012 1:30 PM [facility] [phone] None   04/06/2012 1:30 PM [facility] [phone] None       Medication List     As of 01/28/2012  4:57 PM    STOP taking these medications         FLOMAX 0.4 MG Caps   Generic drug: Tamsulosin HCl      TAKE these medications         acetaminophen 500 MG tablet   Commonly known as: TYLENOL   Take 500 mg by mouth every 6 (six) hours as needed. For pain      ARANESP (ALBUMIN FREE) 150 MCG/0.75ML Soln   Generic drug:  Darbepoetin Alfa-Polysorbate   Inject 150 mg as directed every 7 (seven) days. Friday.      isosorbide mononitrate 30 MG 24 hr tablet   Commonly known as: IMDUR   Take 30 mg by mouth every evening.      metoprolol 50 MG tablet   Commonly known as: LOPRESSOR   Take 1 tablet (50 mg total) by mouth 2 (two) times daily.      nitrofurantoin 100 MG capsule   Commonly known as: MACRODANTIN   Take 100 mg by mouth 2 (two) times daily after a meal. X 7 DAYS    STARTED Monday 01/17/12   TAKES WITH FOOD OR MILK      nitroGLYCERIN 0.4 MG SL tablet   Commonly known as: NITROSTAT   Place 0.4 mg under the tongue every 5 (five) minutes as needed. For chest pain      traMADol 50 MG tablet   Commonly known as: ULTRAM   Take 1 tablet (50 mg total) by mouth every 6 (six) hours as needed for pain.           Follow-up Information    Follow up with [name], MD. On 01/28/2012. (at 2:45)    Contact information:   [address] [place] [address] [phone]          Signed: [name] 01/28/2012, 4:57 PM

## 2012-01-28 NOTE — Progress Notes (Signed)
CRITICAL VALUE ALERT  Critical value received:  Hgb 6.9   Date of notification:  01/28/2012  Time of notification:  0150  Critical value read back: yes  Nurse who received alert:  Bland Span, RN  MD notified (1st page):  Dr Vernie Ammons  Time of first page:  0155  MD notified (2nd page):  Time of second page:  Responding MD:  Dr Vernie Ammons  Time MD responded:  0200 (no new orders)

## 2012-01-28 NOTE — Op Note (Signed)
PATIENT:  Tracy [name]  PRE-OPERATIVE DIAGNOSIS: 1. Persistent gross hematuria 2.Thrombocytopenia  POST-OPERATIVE DIAGNOSIS:  Same  PROCEDURE:  Procedure(s):Cystoscopy with clot evacuation and fulguration  SURGEON:  [name]  INDICATION: Mr. Carmickle underwent a TURP 7 days ago. The procedure Proceeded with very little bleeding intraoperatively or postoperatively however he continued to have bleeding requiring continuous bladder irrigation and required several transfusions. He also underwent platelet transfusions but continued to bleed. He therefore is being brought back to the operating room today for reevaluation, removal of any clots from the bladder and fulguration of any active bleeding points.  ANESTHESIA:  General  EBL:  Minimal  DRAINS: 22 [place], three-way hematuria catheter.  LOCAL MEDICATIONS USED:  None  Description of procedure: After informed consent the patient was taken to the major or, placed on the table and administered general anesthesia. He was then moved to the dorsal lithotomy position his genitalia sterilely prepped and draped after his catheter was removed. An official timeout was then performed.  The 26 French resectoscope sheath with Timberlake obturator was then passed down the urethra and into the bladder. The obturator was removed and the 12 lens was inserted. I noted the bladder itself had very few clots present. There were some old, blanched clots. There was no bleeding noted within the bladder. The prostatic urethra was completely Lined with very firm, tenacious clot likely secondary to the effects of the Amicar.   I used a Toomey syringe to irrigate the bladder and prostatic fossa of as much clot as possible. Reinspection revealed continued adherent clot in the prostatic urethra which was observed with no significant water flowing in to try to detect any bleeding points. I did note one area at approximately the 7:00 position near the apex where there was  some bright red blood and this was fulgurated using the gyrus button. Another area appeared to be bleeding near the bladder neck at about the 7:00 position as well although I think this may have been secondary to the irrigation/clot evacuation procedure. This was fulgurated as well. I then turned the water off with the bladder partially filled and inspected the entire prostatic urethra and could find no further active bleeding.  I reinserted the catheter as described above using a catheter guide and filled the balloon with 20 cc of water. Irrigation returned completely clear. It was connected to closed system drainage and CBI after which the patient was awakened and taken to the recovery room in stable and satisfactory condition. He tolerated procedure well no intraoperative complications.  PATIENT DISPOSITION:  PACU - hemodynamically stable.

## 2012-01-28 NOTE — Progress Notes (Signed)
Patient ID: Tracy [name], male   DOB: 11-27-1919, 78 y.o.   MRN: [id number] 7 Days Post-Op Subjective: Patient is without complaint. His nurse reports that during the night his CBI was stopped however he developed significant darkening of the urine with blood. It was reinitiated.  Objective: Vital signs in last 24 hours: Temp:  [98.1 F (36.7 C)-98.3 F (36.8 C)] 98.2 F (36.8 C) (11/01 0426) Pulse Rate:  [72-79] 79  (11/01 0426) Resp:  [16-18] 18  (11/01 0426) BP: (135-144)/(63-91) 139/63 mmHg (11/01 0426) SpO2:  [94 %-97 %] 94 % (11/01 0426) Weight:  [83.4 kg (183 lb 13.8 oz)] 83.4 kg (183 lb 13.8 oz) (11/01 0426)  Intake/Output from previous day: 10/31 0701 - 11/01 0700 In: 5475 [P.O.:810; I.V.:115; IV Piggyback:50] Out: 7900 [Urine:7900] Intake/Output this shift:    Past Medical History  Diagnosis Date  . CAD (coronary artery disease)     status post prior PCI to the obtuse marginal in 1997 and stenting to the mid to distal RCA in 2001; LHC 9/06:  LM ok, pLAD 50%, mLAD 60% then 60-70%, mRI 50%, mOM1 80-90% (small), pRCA and dRCA stents ok, PDA 50%, EF 65%;  Myoview 5/12: No ischemia, EF 64%.;  NSTEMI 11/12 tx medically   . Chronic kidney disease, stage III (moderate)   . TIA (transient ischemic attack)   . HTN (hypertension)   . Hyperlipidemia   . AF (atrial fibrillation) 02/16/11    Coumadin ==> patient decided to stop after admxn with worsening anemia in setting of UGI bleed, epistaxis  . Arthritis     "in my left ankle"  . Chronic diastolic heart failure   . Carotid stenosis     dopplers 03/2011: 0-39% bilateral  . MDS (myelodysplastic syndrome) 12/16/2011  . Hx of echocardiogram     Echocardiogram 02/17/11: Severe LVH, asymmetric hypertrophy, EF 50-55%, normal wall motion, high ventricular filling pressure, mild LAE, mild RVE, mildly reduced RVSF, mild RAE.  Marland Kitchen Myocardial infarction 2012  . Stroke     5 yrs ago.  Mini - NO RESIDUAL PROBLEMS  . Anemia of chronic disease      RECENT HOSPITALIZATION / BLOOD TRANSFUSION OCT 2013  . Shortness of breath     WITH ANY ACTIVITY---  NO OXYGEN  . CHF (congestive heart failure)     CHRONIC DIASTOLIC HEART FAILURE   Current Facility-Administered Medications  Medication Dose Route Frequency Provider Last Rate Last Dose  . 0.9 %  sodium chloride infusion   Intravenous Continuous Maryruth Bun Rama, MD 10 mL/hr at 01/27/12 1714    . acetaminophen (TYLENOL) tablet 650 mg  650 mg Oral Q4H PRN [name], MD   650 mg at 01/22/12 0734  . aminocaproic acid (AMICAR) tablet 500 mg  500 mg Oral Q8H Si Gaul, MD   500 mg at 01/28/12 0432  . ceFAZolin (ANCEF) IVPB 1 g/50 mL premix  1 g Intravenous Q12H Maryanna Shape Runyon, PHARMD   1 g at 01/27/12 2057  . HYDROcodone-acetaminophen (NORCO/VICODIN) 5-325 MG per tablet 1-2 tablet  1-2 tablet Oral Q4H PRN [name], MD   2 tablet at 01/27/12 2058  . isosorbide mononitrate (IMDUR) 24 hr tablet 30 mg  30 mg Oral QPM Maryruth Bun Rama, MD   30 mg at 01/27/12 1808  . metoprolol (LOPRESSOR) tablet 50 mg  50 mg Oral BID Maryruth Bun Rama, MD   50 mg at 01/27/12 2058  . neomycin-bacitracin-polymyxin (NEOSPORIN) ointment 1 application  1 application  Topical TID PRN [name], MD      . ondansetron [facility]) injection 4 mg  4 mg Intravenous Q4H PRN [name], MD        Physical Exam:  General: Patient is in no apparent distress Lungs: Normal respiratory effort, chest expands symmetrically. GI: The abdomen is soft and nontender without mass.  Foley catheter is draining pink urine on CBI    Lab Results:  Basename 01/28/12 0120 01/27/12 1713 01/27/12 0900  WBC -- -- --  HGB 6.9* 7.1* 7.2*  HCT 21.6* 21.8* 22.3*   BMET No results found for this basename: NA:2,K:2,CL:2,CO2:2,GLUCOSE:2,BUN:2,CREATININE:2,CALCIUM:2 in the last 72 hours No results found for this basename: LABPT:3,INR:3 in the last 72 hours No results found for this basename: LABURIN:1 in the last 72  hours Results for orders placed during the hospital encounter of 01/19/12  SURGICAL PCR SCREEN     Status: Normal   Collection Time   01/19/12 10:54 AM      Component Value Range Status Comment   MRSA, PCR NEGATIVE  NEGATIVE Final    Staphylococcus aureus NEGATIVE  NEGATIVE Final     Studies/Results: @RISRSLT24 @  Assessment/Plan: I have discussed with the patient the fact that he continues to have what appears to be venous bleeding. His hemoglobin has slowly drifted down and he continues to have gross hematuria.  I will plan today to taken to the operating room for cystoscopy, clot evacuation and fulguration. He understands and is in full agreement.  Leeba Barbe C 01/28/2012, 7:06 AM

## 2012-01-28 NOTE — Transfer of Care (Signed)
Immediate Anesthesia Transfer of Care Note  Patient: Tracy [name]  Procedure(s) Performed: Procedure(s) (LRB) with comments: CYSTOSCOPY (N/A) - cystoscopy  EVACUATION HEMATOMA (N/A) -  with fulgeration evacuation of clot  Patient Location: PACU  Anesthesia Type:General  Level of Consciousness: sedated  Airway & Oxygen Therapy: Patient Spontanous Breathing and Patient connected to face mask oxygen  Post-op Assessment: Report given to PACU RN and Post -op Vital signs reviewed and stable  Post vital signs: Reviewed and stable  Complications: No apparent anesthesia complications

## 2012-01-29 LAB — HEMOGLOBIN AND HEMATOCRIT, BLOOD
HCT: 19.8 % — ABNORMAL LOW (ref 39.0–52.0)
HCT: 26.7 % — ABNORMAL LOW (ref 39.0–52.0)
Hemoglobin: 6.3 g/dL — CL (ref 13.0–17.0)
Hemoglobin: 8.4 g/dL — ABNORMAL LOW (ref 13.0–17.0)
Hemoglobin: 8.2 g/dL — ABNORMAL LOW (ref 13.0–17.0)

## 2012-01-29 LAB — PREPARE RBC (CROSSMATCH)

## 2012-01-29 MED ORDER — FUROSEMIDE 10 MG/ML IJ SOLN
20.0000 mg | Freq: Once | INTRAMUSCULAR | Status: AC
  Administered 2012-01-29: 20 mg via INTRAVENOUS
  Filled 2012-01-29: qty 2

## 2012-01-29 NOTE — Progress Notes (Signed)
Urology Progress Note  Subjective:     No acute urologic events overnight. Anemia continues; required transfusion initiation early this morning. Urine remains clear on a slow CBI drip.  ROS: Negative: chest pain.  Objective:  Patient Vitals for the past 24 hrs:  BP Temp Temp src Pulse Resp SpO2 Weight  01/29/12 0638 106/69 mmHg 98.1 F (36.7 C) Oral 78  18  - -  01/29/12 0538 106/48 mmHg 98.2 F (36.8 C) Oral 70  18  - -  01/29/12 0515 102/66 mmHg 98.3 F (36.8 C) Oral 65  18  - -  01/29/12 0448 98/55 mmHg 98.3 F (36.8 C) Oral 65  19  93 % 84.6 kg (186 lb 8.2 oz)  01/28/12 2007 124/69 mmHg 98.1 F (36.7 C) Oral 79  18  98 % -  01/28/12 1630 147/76 mmHg 97.7 F (36.5 C) - 95  19  96 % -  01/28/12 1615 142/85 mmHg - - 82  19  95 % -  01/28/12 1601 - - - 91  19  98 % -  01/28/12 1600 136/97 mmHg 98 F (36.7 C) - 84  14  99 % -  01/28/12 1545 142/78 mmHg - - 90  18  98 % -  01/28/12 1530 142/78 mmHg 98.2 F (36.8 C) - 82  16  98 % -    Physical Exam: General:  No acute distress, awake, alert Cardiovascular:    [x]   S1/S2 present, RRR  []   Irregularly irregular Chest:  CTA-B Abdomen:               []  Soft, appropriately TTP  [x]  Soft, NTTP  []  Soft, appropriately TTP, incision(s) clean/dry/intact  Genitourinary: No edema. Foley:  Draining clear fluid on slow CBI drip.    I/O last 3 completed shifts: In: 10212.5 [P.O.:100; I.V.:805; Blood:137.5; Other:9000; IV Piggyback:170] Out: 16109 [Urine:12700]  Recent Labs  [facility] 01/29/12 0109 01/28/12 1829   HGB 6.3* 7.1*   WBC -- --   PLT -- --    No results found for this basename: NA:2,K:2,CL:2,CO2:2,BUN:2,CREATININE:2,CALCIUM:2,MAGNESIUM:2,GFRNONAA:2,GFRAA:2 in the last 72 hours   No results found for this basename: PT:2,INR:2,APTT:2 in the last 72 hours   No components found with this basename: ABG:2    Length of stay: 8 days.  Assessment: Gross hematuria. BPH. Acute blood loss anemia. POD#8  TURP   Plan: -Complete transfusion of 2 units of red blood cells. Lasix 20mg  in between units. Post-transfusion H&H. -Continue CBI at slow drip. -Disposition: continue inpatient care.   Natalia Leatherwood, MD [phone]

## 2012-01-29 NOTE — Progress Notes (Signed)
CRITICAL VALUE ALERT  Critical value received: Hgb 6.3  Date of notification:  01/29/12  Time of notification:  0155   Critical value read back:yes  Nurse who received alert:  Casilda Carls RN   MD notified (1st page):  Margarita Grizzle  Time of first page:  [phone]  Responding MD: Margarita Grizzle  Time MD responded: [phone]

## 2012-01-30 LAB — BASIC METABOLIC PANEL
BUN: 33 mg/dL — ABNORMAL HIGH (ref 6–23)
CO2: 15 mEq/L — ABNORMAL LOW (ref 19–32)
Calcium: 7.6 mg/dL — ABNORMAL LOW (ref 8.4–10.5)
Chloride: 111 mEq/L (ref 96–112)
Creatinine, Ser: 2.52 mg/dL — ABNORMAL HIGH (ref 0.50–1.35)
Glucose, Bld: 94 mg/dL (ref 70–99)

## 2012-01-30 LAB — CBC
HCT: 25.2 % — ABNORMAL LOW (ref 39.0–52.0)
MCH: 30 pg (ref 26.0–34.0)
MCV: 94.4 fL (ref 78.0–100.0)
RDW: 19.7 % — ABNORMAL HIGH (ref 11.5–15.5)
WBC: 2.9 10*3/uL — ABNORMAL LOW (ref 4.0–10.5)

## 2012-01-30 NOTE — Progress Notes (Signed)
Urology Progress Note  Subjective:     No acute urologic events overnight. Anemia continues but is stable for now. No transfusion required this morning. He did not require any catheter flushing overnight. Urine remains clear on a slow CBI drip.  ROS: Negative: chest pain.  Objective:  Patient Vitals for the past 24 hrs:  BP Temp Temp src Pulse Resp SpO2  01/30/12 0523 125/66 mmHg 98 F (36.7 C) Oral 68  16  92 %  01/29/12 2100 128/72 mmHg 98 F (36.7 C) Oral 71  18  95 %  01/29/12 1347 118/72 mmHg 98 F (36.7 C) Oral 72  16  93 %  01/29/12 1100 107/68 mmHg 98 F (36.7 C) Oral 74  16  -  01/29/12 1042 100/52 mmHg 98.2 F (36.8 C) Oral 66  18  -  01/29/12 0942 108/66 mmHg 98 F (36.7 C) Oral 79  18  -  01/29/12 0845 126/74 mmHg 97.9 F (36.6 C) Oral 79  18  -  01/29/12 0830 111/58 mmHg 98 F (36.7 C) Oral 74  18  -  01/29/12 0820 111/58 mmHg 98 F (36.7 C) Oral 74  18  -    Physical Exam: General:  No acute distress, awake, alert Cardiovascular:    [x]   S1/S2 present, RRR  []   Irregularly irregular Chest:  CTA-B Abdomen:               []  Soft, appropriately TTP  [x]  Soft, NTTP  []  Soft, appropriately TTP, incision(s) clean/dry/intact  Genitourinary: No edema. Foley:  Draining clear fluid on slow CBI drip.    I/O last 3 completed shifts: In: 22467.2 [P.O.:720; I.V.:1147.2; Blood:150; ZOXWR:60454; IV Piggyback:50] Out: 09811 [Urine:23780]  Recent Labs  [facility] 01/30/12 0525 01/29/12 2145   HGB 8.0* 8.2*   WBC 2.9* --   PLT 58* --    Recent Labs  [facility] 01/30/12 0525   NA 135   K 4.8   CL 111   CO2 15*   BUN 33*   CREATININE 2.52*   CALCIUM 7.6*   GFRNONAA 21*   GFRAA 24*     No results found for this basename: PT:2,INR:2,APTT:2 in the last 72 hours   No components found with this basename: ABG:2    Length of stay: 9 days.  Assessment: Gross hematuria. BPH. Acute blood loss anemia. POD#9 TURP   Plan: -Change H&H checks to Q12 hours from  Q8 hours. -Turn CBI off. May be able to discontinue catheter tomorrow. -Disposition: continue inpatient care.   Natalia Leatherwood, MD [phone]

## 2012-01-31 ENCOUNTER — Encounter (HOSPITAL_COMMUNITY): Payer: Self-pay | Admitting: Urology

## 2012-01-31 DIAGNOSIS — R040 Epistaxis: Secondary | ICD-10-CM

## 2012-01-31 LAB — BASIC METABOLIC PANEL
BUN: 32 mg/dL — ABNORMAL HIGH (ref 6–23)
CO2: 15 mEq/L — ABNORMAL LOW (ref 19–32)
Calcium: 7.6 mg/dL — ABNORMAL LOW (ref 8.4–10.5)
Glucose, Bld: 94 mg/dL (ref 70–99)
Potassium: 4.8 mEq/L (ref 3.5–5.1)
Sodium: 137 mEq/L (ref 135–145)

## 2012-01-31 LAB — CBC
HCT: 24.3 % — ABNORMAL LOW (ref 39.0–52.0)
Hemoglobin: 7.7 g/dL — ABNORMAL LOW (ref 13.0–17.0)
MCV: 95.7 fL (ref 78.0–100.0)
WBC: 3.3 10*3/uL — ABNORMAL LOW (ref 4.0–10.5)

## 2012-01-31 LAB — HEMOGLOBIN AND HEMATOCRIT, BLOOD: HCT: 25.6 % — ABNORMAL LOW (ref 39.0–52.0)

## 2012-01-31 MED ORDER — AMINOCAPROIC ACID 500 MG PO TABS
500.0000 mg | ORAL_TABLET | Freq: Three times a day (TID) | ORAL | Status: DC
  Administered 2012-01-31 – 2012-02-04 (×12): 500 mg via ORAL
  Filled 2012-01-31 (×15): qty 1

## 2012-01-31 NOTE — Progress Notes (Signed)
Epistaxis has resolved. Will continue to monitor.

## 2012-01-31 NOTE — Progress Notes (Signed)
Pt with  new onset of right epistaxis. Marlowe Kays, PA at bed side. Right nare packed with two 4x4 gauze. Huntley Dec, [place] will discuss plan of care with Dr. [name]. Will continue to monitor.

## 2012-01-31 NOTE — Progress Notes (Signed)
MD returned page. MD to place orders in Epic to restart Amicar. Will continue to monitor.

## 2012-01-31 NOTE — Progress Notes (Signed)
Patient ID: Tracy [name], male   DOB: 1919/06/22, 78 y.o.   MRN: [id number] 3 Days Post-Op Subjective: Patient is without complaint.  Objective: Vital signs in last 24 hours: Temp:  [97.3 F (36.3 [name])-98 F (36.7 [name])] 97.3 F (36.3 [name]) (11/04 0622) Pulse Rate:  [63-86] 63  (11/04 0622) Resp:  [16-18] 16  (11/04 0622) BP: (130-150)/(71-87) 130/75 mmHg (11/04 0622) SpO2:  [92 %-97 %] 92 % (11/04 0622) Weight:  [85.3 kg (188 lb 0.8 oz)-85.6 kg (188 lb 11.4 oz)] 85.6 kg (188 lb 11.4 oz) (11/04 0714)  Intake/Output from previous day: 11/03 0701 - 11/04 0700 In: 1300 [P.O.:1020; I.V.:280] Out: 2125 [Urine:2125] Intake/Output this shift:    Past Medical History  Diagnosis Date  . CAD (coronary artery disease)     status post prior PCI to the obtuse marginal in 1997 and stenting to the mid to distal RCA in 2001; LHC 9/06:  LM ok, pLAD 50%, mLAD 60% then 60-70%, mRI 50%, mOM1 80-90% (small), pRCA and dRCA stents ok, PDA 50%, EF 65%;  Myoview 5/12: No ischemia, EF 64%.;  NSTEMI 11/12 tx medically   . Chronic kidney disease, stage III (moderate)   . TIA (transient ischemic attack)   . HTN (hypertension)   . Hyperlipidemia   . AF (atrial fibrillation) 02/16/11    Coumadin ==> patient decided to stop after admxn with worsening anemia in setting of UGI bleed, epistaxis  . Arthritis     "in my left ankle"  . Chronic diastolic heart failure   . Carotid stenosis     dopplers 03/2011: 0-39% bilateral  . MDS (myelodysplastic syndrome) 12/16/2011  . Hx of echocardiogram     Echocardiogram 02/17/11: Severe LVH, asymmetric hypertrophy, EF 50-55%, normal wall motion, high ventricular filling pressure, mild LAE, mild RVE, mildly reduced RVSF, mild RAE.  Marland Kitchen Myocardial infarction 2012  . Stroke     5 yrs ago.  Mini - NO RESIDUAL PROBLEMS  . Anemia of chronic disease     RECENT HOSPITALIZATION / BLOOD TRANSFUSION OCT 2013  . Shortness of breath     WITH ANY ACTIVITY---  NO OXYGEN  . CHF (congestive  heart failure)     CHRONIC DIASTOLIC HEART FAILURE   Current Facility-Administered Medications  Medication Dose Route Frequency Provider Last Rate Last Dose  . 0.9 %  sodium chloride infusion   Intravenous Continuous Maryruth Bun Rama, MD 10 mL/hr at 01/28/12 1607    . acetaminophen (TYLENOL) tablet 650 mg  650 mg Oral Q4H PRN [name], MD   650 mg at 01/22/12 0734  . HYDROcodone-acetaminophen (NORCO/VICODIN) 5-325 MG per tablet 1-2 tablet  1-2 tablet Oral Q4H PRN [name], MD   2 tablet at 01/30/12 2115  . isosorbide mononitrate (IMDUR) 24 hr tablet 30 mg  30 mg Oral QPM Maryruth Bun Rama, MD   30 mg at 01/30/12 1718  . metoprolol (LOPRESSOR) tablet 50 mg  50 mg Oral BID Maryruth Bun Rama, MD   50 mg at 01/30/12 2116  . neomycin-bacitracin-polymyxin (NEOSPORIN) ointment 1 application  1 application Topical TID PRN [name], MD      . ondansetron [facility]) injection 4 mg  4 mg Intravenous Q4H PRN [name], MD      . sodium chloride irrigation 0.9 % 3,000 mL  3,000 mL Irrigation Continuous [name], MD   3,000 mL at 01/28/12 2101  . [DISCONTINUED] aminocaproic acid (AMICAR) 10 g in sodium chloride 0.9 %  500 mL infusion  1 g/hr Intravenous Continuous [name], MD 50 mL/hr at 01/30/12 1517 1 g/hr at 01/30/12 1517  . [DISCONTINUED] aminocaproic acid (AMICAR) tablet 500 mg  500 mg Oral Q8H Si Gaul, MD   500 mg at 01/31/12 0543    Physical Exam:  General: Patient is in no apparent distress Lungs: Normal respiratory effort, chest expands symmetrically. GI: The abdomen is soft and nontender without mass. Foley: His catheter is draining slightly pink urine with no clots. He has been on CBI.    Lab Results:  Basename 01/31/12 0452 01/30/12 1624 01/30/12 0525  WBC -- -- 2.9*  HGB 8.1* 8.2* 8.0*  HCT 25.6* 26.0* 25.2*   BMET  Basename 01/31/12 0452 01/30/12 0525  NA 137 135  K 4.8 4.8  CL 114* 111  CO2 15* 15*  GLUCOSE 94 94  BUN 32* 33*    CREATININE 2.84* 2.52*  CALCIUM 7.6* 7.6*   No results found for this basename: LABPT:3,INR:3 in the last 72 hours No results found for this basename: LABURIN:1 in the last 72 hours Results for orders placed during the hospital encounter of 01/19/12  SURGICAL PCR SCREEN     Status: Normal   Collection Time   01/19/12 10:54 AM      Component Value Range Status Comment   MRSA, PCR NEGATIVE  NEGATIVE Final    Staphylococcus aureus NEGATIVE  NEGATIVE Final     Studies/Results: @RISRSLT24 @  Assessment/Plan: He has gone 24 hours on CBI without clot retention. His urine is slightly bloody but that certainly could be secondary to lysis of old clots. I think it is time to see how he does without his catheter and therefore it will be removed today for a voiding trial. It is possible he may be ready for discharge this evening or tomorrow. 1. DC Foley catheter. 2. He is going to force fluids. 3. I have stopped his Amicar order 4. I have changed his hemoglobin checks to every 24 hours. 5. Possible discharge this evening or tomorrow.  Tracy [name] 01/31/2012, 7:19 AM

## 2012-01-31 NOTE — Progress Notes (Signed)
Patient Identification:  78 year old white male patient of Dr. [name] with a history of myelodysplastic syndrome and pancytopenia,admitted on 01/20/2012 to [facility] for TURP by his urologist Dr. Vernie Ammons. This procedure was complicated by significant hematuria and to both his hemoglobin to 6.5mg /dL requiring transfusion of blood and platelets.Patient is not on Coumadin. .   Subjective: Events since 10/30 noted. Last transfusion of  2 units blood on 11/02 for Hb 8.4.   Amicar has been discontinued today, with last platelet count was 58,000 on 11/3.He in now bleeding from the Right nostril, has filled 2 big gauzed without resolution.No other areas of bleeding. No shortness of breath, no cardiac issues.   Objective: Vital signs in last 24 hours: BP 130/75  Pulse 63  Temp 97.3 F (36.3 C) (Oral)  Resp 16  Ht 6\' 1"  (1.854 m)  Wt 188 lb 11.4 oz (85.6 kg)  BMI 24.90 kg/m2  SpO2 92%   Physical Exam:  78 y.o.  in no acute distress  A. and O. x3 HEENT: Sclera anicteric. Oral cavity without thrush or lesions. Right nostril with active bleeding.  Neck supple.no cervical or supraclavicular adenopathy  Lungs: CTA. No wheezing, rhonchi or rales. No axillary masses. CV regular rate and rhythm normal S1-S2, no murmur , rubs or gallops Abdomen soft nontender , bowel sounds x4  no hepatosplenomegaly GU/rectal: deferred. Extremities: no clubbing cyanosis . No edema. Several areas of old  bruising with no  petechial rash Neurologic: non focal    Lab Results: Labs:  CBC   Lab 01/31/12 0452 01/30/12 1624 01/30/12 0525 01/29/12 2145 01/29/12 1320 01/25/12 0156  WBC -- -- 2.9* -- -- 5.0  HGB 8.1* 8.2* 8.0* 8.2* 8.4* --  HCT 25.6* 26.0* 25.2* 25.6* 26.7* --  PLT -- -- 58* -- -- 56*  MCV -- -- 94.4 -- -- 91.9  MCH -- -- 30.0 -- -- 31.2  MCHC -- -- 31.7 -- -- 34.0  RDW -- -- 19.7* -- -- 19.5*  LYMPHSABS -- -- -- -- -- --  MONOABS -- -- -- -- -- --  EOSABS -- -- -- -- -- --    BASOSABS -- -- -- -- -- --  BANDABS -- -- -- -- -- --    CMP    Lab 01/31/12 0452 01/30/12 0525  NA 137 135  K 4.8 4.8  CL 114* 111  CO2 15* 15*  GLUCOSE 94 94  BUN 32* 33*  CREATININE 2.84* 2.52*  CALCIUM 7.6* 7.6*  MG -- --  AST -- --  ALT -- --  ALKPHOS -- --  BILITOT -- --        Component Value Date/Time   BILITOT 2.20* 01/12/2012 1307   BILITOT 1.5* 12/24/2011 1555   BILIDIR 0.3 12/03/2011 2353   IBILI 0.7 12/03/2011 2353      Imaging Studies:  No results found.   Assessment/Plan: 14. 78 year old white male patient of Dr. [name] with a history of myelodysplastic syndrome and pancytopenia, admitted for TURP on 10/24, followed by Hematology due to bleeding issues. Patient has received multiple transfusions of blood, most recently 11/02 for Hb 8.4.   Amicar has been discontinued today, with last platelet count was 58,000 on 11/3.However, new episode of R epistaxis is currently present while not on Amicar. Will check platelet count today, may need transfusion of platelets prior to discharge; His Amicar may need to be re-started. ENT may need to be involved. Will discuss with Dr. [name].  2. All other medical issues as per respective specialties. Appreciate all the medical care provided to this nice patient.    LOS: 10 days   [facility] E 01/31/2012, 7:51 AM Hematology/oncology attending: The patient is seen and examined today. I agree with the above note. He was feeling fine with no significant bleeding issues until earlier today when he started having epistaxis from the right nostril and this was packed with gauze with some improvement. He has a history of epistaxis in the past and was seen by Dr. Pollyann Kennedy who advised him to use nasal spray. His platelets count yesterday were stable at 58,000. The patient has been off Amicar for the last few days and I would consider resuming her today until his epistaxis improved. He is expected to be discharged home very soon. He has  a followup appointment with me on 02/09/2012. I don't see any need to transfuse him with platelets today.

## 2012-01-31 NOTE — Progress Notes (Signed)
PT Cancellation Note  Patient Details Name: Tracy [name] MRN: [id number] DOB: [birth year]   Cancelled Treatment:    Reason Eval/Treat Not Completed: Other (comment) (Pt adamantly refused)  Will check on pt as schedule permits.    Page, Meribeth [name] 01/31/2012, 3:17 PM

## 2012-01-31 NOTE — Progress Notes (Signed)
Pt unable to void after catheter removed. Bladder scan preformed showing 69 cc of urine  in bladder. Pt encouraged to drink more fluids. Will continue to monitor.

## 2012-01-31 NOTE — Progress Notes (Signed)
Brief Nutrition Note  Patient with length of stay. Patient remains not at nutrition risk. PO intake documented 100% at meals.   RD available for nutrition needs.   Iven Finn [facility] [phone]

## 2012-01-31 NOTE — Progress Notes (Signed)
Dr. [name] paged at the request of pt and his wife. Awaiting return call.

## 2012-01-31 NOTE — Anesthesia Postprocedure Evaluation (Signed)
  Anesthesia Post-op Note  Patient: Tracy [name]  Procedure(s) Performed: Procedure(s) (LRB): CYSTOSCOPY (N/A) EVACUATION HEMATOMA (N/A)  Patient Location: PACU  Anesthesia Type: General  Level of Consciousness: awake and alert   Airway and Oxygen Therapy: Patient Spontanous Breathing  Post-op Pain: mild  Post-op Assessment: Post-op Vital signs reviewed, Patient's Cardiovascular Status Stable, Respiratory Function Stable, Patent Airway and No signs of Nausea or vomiting  Post-op Vital Signs: stable  Complications: No apparent anesthesia complications

## 2012-01-31 NOTE — Progress Notes (Signed)
Epistaxis has resolved.Packing changed. Will continue to monitor.

## 2012-01-31 NOTE — Progress Notes (Signed)
Rt epistaxis has resumed.  Rt nare re packed with 2 4x4 gauze.Dr. [name]  paged. Awaiting return call. Will continue to monitor.

## 2012-02-01 ENCOUNTER — Ambulatory Visit (HOSPITAL_COMMUNITY): Payer: Medicare Other

## 2012-02-01 ENCOUNTER — Inpatient Hospital Stay (HOSPITAL_COMMUNITY)
Admission: RE | Admit: 2012-02-01 | Discharge: 2012-02-01 | Disposition: A | Discharge status: Home / Self Care | Payer: Medicare Other | Source: Ambulatory Visit | Attending: Internal Medicine | Admitting: Internal Medicine

## 2012-02-01 ENCOUNTER — Inpatient Hospital Stay (HOSPITAL_COMMUNITY): Payer: Medicare Other

## 2012-02-01 ENCOUNTER — Encounter (HOSPITAL_COMMUNITY): Payer: Medicare Other

## 2012-02-01 DIAGNOSIS — R55 Syncope and collapse: Secondary | ICD-10-CM

## 2012-02-01 DIAGNOSIS — I4891 Unspecified atrial fibrillation: Secondary | ICD-10-CM

## 2012-02-01 DIAGNOSIS — Z8673 Personal history of transient ischemic attack (TIA), and cerebral infarction without residual deficits: Secondary | ICD-10-CM | POA: Insufficient documentation

## 2012-02-01 LAB — HEMOGLOBIN AND HEMATOCRIT, BLOOD
HCT: 25.1 % — ABNORMAL LOW (ref 39.0–52.0)
Hemoglobin: 7.8 g/dL — ABNORMAL LOW (ref 13.0–17.0)

## 2012-02-01 LAB — COMPREHENSIVE METABOLIC PANEL
ALT: <5 U/L (ref 0–53)
AST: 52 U/L — ABNORMAL HIGH (ref 0–37)
Albumin: 2.2 g/dL — ABNORMAL LOW (ref 3.5–5.2)
Alkaline Phosphatase: 80 U/L (ref 39–117)
BUN: 37 mg/dL — ABNORMAL HIGH (ref 6–23)
CO2: 14 mEq/L — ABNORMAL LOW (ref 19–32)
Calcium: 7.6 mg/dL — ABNORMAL LOW (ref 8.4–10.5)
Chloride: 110 mEq/L (ref 96–112)
Creatinine, Ser: 3.92 mg/dL — ABNORMAL HIGH (ref 0.50–1.35)
GFR calc Af Amer: 14 mL/min — ABNORMAL LOW (ref >90)
GFR calc non Af Amer: 12 mL/min — ABNORMAL LOW (ref >90)
Glucose, Bld: 97 mg/dL (ref 70–99)
Potassium: 5.5 mEq/L — ABNORMAL HIGH (ref 3.5–5.1)
Sodium: 135 mEq/L (ref 135–145)
Total Bilirubin: 0.6 mg/dL (ref 0.3–1.2)
Total Protein: 5.1 g/dL — ABNORMAL LOW (ref 6.0–8.3)

## 2012-02-01 MED ORDER — SODIUM POLYSTYRENE SULFONATE 15 GM/60ML PO SUSP
30.0000 g | Freq: Once | ORAL | Status: AC
  Administered 2012-02-01: 30 g via ORAL
  Filled 2012-02-01: qty 120

## 2012-02-01 MED ORDER — FUROSEMIDE 10 MG/ML IJ SOLN
20.0000 mg | Freq: Once | INTRAMUSCULAR | Status: AC
  Administered 2012-02-01: 20 mg via INTRAVENOUS
  Filled 2012-02-01: qty 2

## 2012-02-01 NOTE — Progress Notes (Deleted)
History: 78 yo M with brief episode of syncope/unresponsiveness this morning.   Sedation: None  Background: The amjority of this recording was in sleep or drowsiness. There is a breif period of wakefullness during which a PDR of 8 hz was seen. There is genrealized delta associated with drowsiness, but this appears to abate during his arousal.   Photic stimulation: Physiologic driving is not performed  EEG Diagnosis: 1) Normal sleep and waking eeg  Clinical Interpretation: This normal EEG is recorded in the waking and sleep state. There was no seizure or seizure predisposition recorded on this study.   McNeill Kirkpatrick, MD [facility] [phone]  If 7pm- 7am, please page neurology on call at [phone].   

## 2012-02-01 NOTE — Progress Notes (Signed)
Bladder scan show 289cc of urine in bladder. Dr. Vernie Ammons notified. New order obtained and followed out. Will continue to monitor.

## 2012-02-01 NOTE — Progress Notes (Signed)
Patient got up to use bedside commode with assist and doubled over and had an episode of moderate projectile vommitting. Patient's respirations slowed down and he began to have some wheezes.  Patient soon recovered, got cleaned up and placed back to bed.  Will continue to closely monitor patient. Manson Passey, [place]akop Humbarger Cherie

## 2012-02-01 NOTE — Progress Notes (Signed)
PT Cancellation Note  Patient Details Name: COHAN STIPES MRN: [id number] DOB: April 09, 1919   Cancelled Treatment:    Reason Eval/Treat Not Completed: Patient at procedure or test/unavailable.  Will check back as schedule permits.    Thanks,    Page, Meribeth [name] 02/01/2012, 11:41 AM

## 2012-02-01 NOTE — Consult Note (Signed)
[facility] Medical Consultation  Tracy [name] ZOX:[id number] DOB: [birth year] DOA: 01/21/2012 PCP: [name], MD   Requesting physician: Dr. Vernie Ammons Date of consultation: 02/01/2012 Reason for consultation: Multiple medical issues  Patient is a 78 year old male with a past medical history including but not limited to myelodysplastic syndrome, thrombocytopenia, CKD, atrial fibrillation (not currently on coumadin). Patient was admitted 01/20/2012 for urinary retention and subsequent TURP. Patient has had Significant hematuria with hemoglobin as low as 6.5 and requiring transfusions of blood and platelets in past. Hospitalist was involved as Research scientist (medical) at that time but do to patient's request to be taken care by urologist and oncologist so hospitalist subsequently signed off. Urology again request a consult for multiple medical issues. Patient was seen and examined at bedside. Patient is not willing to provide much of history. He is sitting in bed and eating breakfast and reports feeling fine. I spoke with the patient's nurse in regards to any sign of active bleed. Besides epistaxis the patient had yesterday there is no other sign of active bleed. Per Epic review patient did have previous episodes of epistaxis and recommendation at that time was to use a nasal spray. Additional issue is a syncopal event and period of unresponsiveness this morning.  Assessment/Plan:   Principal Problem:  *Unresponsiveness, syncope  Unclear ideology at this time, differential includes vasovagal versus TIA versus seizure  Patient is alert and awake at this time, hemodynamically stable  Order placed for CT head without contrast which showed no acute intracranial findings  Please followup the results of EEG  Patient has had 2-D echo done in September 2013 which showed ejection fraction of 50%  Followup PT evaluation  Active Problems:  Acute blood loss anemia Hemoglobin today is 7.8 We will give  1 unit of PRBCs today Platelet count is stable at 58 Please note that patient has received previous transfusions including total of 5 units of PRBCs and 2 unit of platelets since admission We will give Lasix after 1 unit of transfusion, 20 mg IV Monitor for fluid overload Chronic systolic and diastolic congestive heart failure Elevated proBNP due to diastolic CHF as well as CKD CHF compensated at this time. Hyperkalemia  We'll give 1 dose of Kayexalate  Followup BMP in the morning Essential hypertension, benign  Continue Imdur 30 mg daily and metoprolol 50 mg twice daily Atrial fibrillation  Rate controlled with metoprolol Not on Coumadin CKD (chronic kidney disease), stage IV  Creatinine at baseline MDS (myelodysplastic syndrome)  Chronic anemia and thrombocytopenia Oncology is following  Code Status: Full  Family Communication: There is no family at bedside Disposition Plan: Per primary team  Manson Passey [facility] Pager [phone] Cell # [phone]  If 7PM-7AM, please contact night-coverage [email or web address] Password [facility] 02/01/2012, 11:03 AM   We will followup again tomorrow. Please contact me if I can be of assistance in the meanwhile. Thank you for this consultation.   Review of Systems:  Constitutional: Negative for fever, chills, diaphoresis, activity change, appetite change and fatigue.  HENT: Negative for ear pain, nosebleeds, congestion, facial swelling, rhinorrhea, neck pain, neck stiffness and ear discharge.   Eyes: Negative for pain, discharge, redness, itching and visual disturbance.  Respiratory: Negative for cough, choking, chest tightness, shortness of breath, wheezing and stridor.   Cardiovascular: Negative for chest pain, palpitations and leg swelling.  Gastrointestinal: Negative for abdominal distention.  Genitourinary: Negative for dysuria, urgency, frequency, hematuria, flank pain, decreased urine volume, difficulty urinating and  dyspareunia.  Musculoskeletal: Negative  for back pain, joint swelling, arthralgias and gait problem.  Neurological: Negative for dizziness, seizures, positive for syncope, no weakness, light-headedness, numbness and headaches.  Hematological: Negative for adenopathy. Does not bruise/bleed easily.  Psychiatric/Behavioral: Negative for hallucinations, behavioral problems, confusion, dysphoric mood, decreased concentration and agitation.    Past Medical History  Diagnosis Date  . CAD (coronary artery disease)     status post prior PCI to the obtuse marginal in 1997 and stenting to the mid to distal RCA in 2001; LHC 9/06:  LM ok, pLAD 50%, mLAD 60% then 60-70%, mRI 50%, mOM1 80-90% (small), pRCA and dRCA stents ok, PDA 50%, EF 65%;  Myoview 5/12: No ischemia, EF 64%.;  NSTEMI 11/12 tx medically   . Chronic kidney disease, stage III (moderate)   . TIA (transient ischemic attack)   . HTN (hypertension)   . Hyperlipidemia   . AF (atrial fibrillation) 02/16/11    Coumadin ==> patient decided to stop after admxn with worsening anemia in setting of UGI bleed, epistaxis  . Arthritis     "in my left ankle"  . Chronic diastolic heart failure   . Carotid stenosis     dopplers 03/2011: 0-39% bilateral  . MDS (myelodysplastic syndrome) 12/16/2011  . Hx of echocardiogram     Echocardiogram 02/17/11: Severe LVH, asymmetric hypertrophy, EF 50-55%, normal wall motion, high ventricular filling pressure, mild LAE, mild RVE, mildly reduced RVSF, mild RAE.  Marland Kitchen Myocardial infarction 2012  . Stroke     5 yrs ago.  Mini - NO RESIDUAL PROBLEMS  . Anemia of chronic disease     RECENT HOSPITALIZATION / BLOOD TRANSFUSION OCT 2013  . Shortness of breath     WITH ANY ACTIVITY---  NO OXYGEN  . CHF (congestive heart failure)     CHRONIC DIASTOLIC HEART FAILURE   Past Surgical History  Procedure Date  . Nose surgery 1986    deviated septum  . Total knee arthroplasty 2009    left  . Joint replacement 2009    left  knee  . Surgery scrotal / testicular 197O    CORRECTIVE SURGERY TO VARICOCELE (SWELLING OF SCROTUM)  . Esophagogastroduodenoscopy 12/09/2011    Procedure: ESOPHAGOGASTRODUODENOSCOPY (EGD);  Surgeon: Willis Modena, MD;  Location[facility] ENDOSCOPY;  Service: Endoscopy;  Laterality: N/A;  outlaw/ebp  . Tonsillectomy and adenoidectomy 1929  . Appendectomy 1930's  . Left ear mastoid surgery 1933  . Removal of one testicle 1944  . Correction to botched varicocele surgery 1970  . Eye surgery 1993    SURGERY FOR DETACHED RETINA LEFT EYE  . Left cataract extraction with lens implant 1993  . Coronary angioplasty with stent placement     3 procedures; 3 stents total  1994 & 1996  . Right cataract extraction with lens implant   1997  . Transurethral resection of prostate 01/21/2012    Procedure: TRANSURETHRAL RESECTION OF THE PROSTATE WITH GYRUS INSTRUMENTS;  Surgeon: [name], MD;  Location: [facility] ORS;  Service: Urology;  Laterality: N/A;  . Cystoscopy 01/28/2012    Procedure: CYSTOSCOPY;  Surgeon: [name], MD;  Location: [facility] ORS;  Service: Urology;  Laterality: N/A;  cystoscopy   . Hematoma evacuation 01/28/2012    Procedure: EVACUATION HEMATOMA;  Surgeon: [name], MD;  Location: [facility] ORS;  Service: Urology;  Laterality: N/A;   with fulgeration evacuation of clot   Social History:  reports that he quit smoking about 33 years ago. His smoking use included Cigarettes and Pipe. He has  a 80 pack-year smoking history. He has never used smokeless tobacco. He reports that he drinks alcohol. He reports that he does not use illicit drugs.  Allergies  Allergen Reactions  . Oxycodone Hcl Other (See Comments)    Wanted to climb the wall  . Prednisone Other (See Comments)    Gain weight    Family history: Heart disease in parents  Prior to Admission medications   Medication Sig Start Date End Date Taking? Authorizing Provider  acetaminophen (TYLENOL) 500 MG tablet Take 500 mg by mouth every 6  (six) hours as needed. For pain   Yes Historical Provider, MD  metoprolol (LOPRESSOR) 50 MG tablet Take 1 tablet (50 mg total) by mouth 2 (two) times daily. 01/11/12 01/10/13 Yes Beatrice Lecher, PA  nitrofurantoin (MACRODANTIN) 100 MG capsule Take 100 mg by mouth 2 (two) times daily after a meal. X 7 DAYS    STARTED Monday 01/17/12   TAKES WITH FOOD OR MILK   Yes Historical Provider, MD  nitroGLYCERIN (NITROSTAT) 0.4 MG SL tablet Place 0.4 mg under the tongue every 5 (five) minutes as needed. For chest pain   Yes Historical Provider, MD  Darbepoetin Alfa-Polysorbate (ARANESP, ALBUMIN FREE,) 150 MCG/0.75ML SOLN Inject 150 mg as directed every 7 (seven) days. Friday.    Historical Provider, MD  isosorbide mononitrate (IMDUR) 30 MG 24 hr tablet Take 30 mg by mouth every evening.    Historical Provider, MD  traMADol (ULTRAM) 50 MG tablet Take 1 tablet (50 mg total) by mouth every 6 (six) hours as needed for pain. 01/21/12   [name], MD   Physical Exam: Blood pressure 109/59, pulse 63, temperature 97.9 F (36.6 C), temperature source Oral, resp. rate 18, height 6\' 1"  (1.854 m), weight 87.771 kg (193 lb 8 oz), SpO2 94.00%. Filed Vitals:   01/31/12 1443 01/31/12 2149 02/01/12 0603 02/01/12 0604  BP: 143/69 119/74 123/62 109/59  Pulse: 71 74 63   Temp: 97.8 F (36.6 C) 97.3 F (36.3 C) 97.9 F (36.6 C)   TempSrc: Oral Oral Oral   Resp: 18 17 18    Height:      Weight:    87.771 kg (193 lb 8 oz)  SpO2: 98% 94% 93% 94%    Gen: NAD. Alert, awake and oriented Cardiovascular: Irregular rhythm, rate controlled, positive S1, S2  Respiratory: Clear to auscultation bilaterally, no wheezing or rhonchi appreciated Gastrointestinal: Abdomen soft, NT/ND, + BS  Neurological: No focal neurological deficit Extremities: No C/E/C   Labs on Admission:  Basic Metabolic Panel:  Lab 02/01/12 9604 01/31/12 0452 01/30/12 0525  NA 135 137 135  K 5.5* 4.8 4.8  CL 110 114* 111  CO2 14* 15* 15*  GLUCOSE  97 94 94  BUN 37* 32* 33*  CREATININE 3.92* 2.84* 2.52*  CALCIUM 7.6* 7.6* 7.6*   Liver Function Tests:  Lab 02/01/12 0815  AST 52*  ALT <5  ALKPHOS 80  BILITOT 0.6  PROT 5.1*  ALBUMIN 2.2*   CBC:  Lab 02/01/12 0455 01/31/12 0452 01/30/12 1624 01/30/12 0525 01/29/12 2145  WBC -- 3.3* -- 2.9* --  HGB 7.8* 8.1*7.7* 8.2* 8.0* 8.2*  HCT 25.1* 25.6*24.3* 26.0* 25.2* 25.6*  MCV -- 95.7 -- 94.4 --  PLT -- 58* -- 58* --    Radiological Exams on Admission: Dg Abd 1 View 02/01/2012  *  IMPRESSION: No acute findings.  No evidence of a bowel obstruction.   Original Report Authenticated By: Amie Portland, M.D.  Ct Head Wo Contrast 02/01/2012   IMPRESSION: 1. No acute intracranial abnormality. 2.  Some progression of small vessel disease and cerebral volume loss since 2007. 3.  Chronic mild paranasal sinus mucosal thickening.   Original Report Authenticated By: Erskine Speed, M.D.     Time spent: 55 minutes

## 2012-02-01 NOTE — Progress Notes (Signed)
Patient has an episode this am where he passed out after getting up with the tech to get weighed.  Patient found in bed and was breathing but was not responding, eyes were closed, and patient's arms were twisting.  Performed sternal rubbed and patient soon came around.  Patient was gagging but never vommitted.  Gave Zofran IV to prevent vomiting.  Notified Dr. Vernie Ammons of am's events when he made rounds this a.m.  Will continue to monitor patient. Manson Passey, Delford Wingert Cherie

## 2012-02-01 NOTE — Progress Notes (Signed)
125c of urine in foley from 0600-1500. Hand irrigated foley with 60cc of NS with one small clot returned. Bladder scan  Showed 0cc in bladder. Dr. Vernie Ammons notified. Will continue to monitor.

## 2012-02-01 NOTE — Progress Notes (Signed)
4 Days Post-Op Subjective: He had an episode of emesis yesterday. He also was unable to void spontaneously after his catheter was removed. When I saw him this morning I learned from his nurse that he had a syncopal episode last night. I was told he did not stop breathing and this lasted only a short period of time. He does not appear anyone was contacted regarding this. He seems to be doing well this morning although not feeling quite as well as he has been in previous days.  Objective: Vital signs in last 24 hours: Temp:  [97.3 F (36.3 C)-97.9 F (36.6 C)] 97.9 F (36.6 C) (11/05 0603) Pulse Rate:  [63-74] 63  (11/05 0603) Resp:  [17-18] 18  (11/05 0603) BP: (109-143)/(59-74) 109/59 mmHg (11/05 0604) SpO2:  [93 %-98 %] 94 % (11/05 0604) Weight:  [87.771 kg (193 lb 8 oz)] 87.771 kg (193 lb 8 oz) (11/05 0604)  Intake/Output from previous day: 11/04 0701 - 11/05 0700 In: 1120 [P.O.:1120] Out: 725 [Urine:725] Intake/Output this shift:    Physical Exam:  General:alert, cooperative and no distress GI: not done and soft, non tender, normal bowel sounds, no palpable masses. I Note that he is able to move all extremities.   Lab Results:  Basename 02/01/12 0455 01/31/12 0452 01/30/12 1624  HGB 7.8* 8.1*7.7* 8.2*  HCT 25.1* 25.6*24.3* 26.0*   BMET  Basename 01/31/12 0452 01/30/12 0525  NA 137 135  K 4.8 4.8  CL 114* 111  CO2 15* 15*  GLUCOSE 94 94  BUN 32* 33*  CREATININE 2.84* 2.52*  CALCIUM 7.6* 7.6*   No results found for this basename: LABPT:3,INR:3 in the last 72 hours No results found for this basename: LABURIN:1 in the last 72 hours Results for orders placed during the hospital encounter of 01/19/12  SURGICAL PCR SCREEN     Status: Normal   Collection Time   01/19/12 10:54 AM      Component Value Range Status Comment   MRSA, PCR NEGATIVE  NEGATIVE Final    Staphylococcus aureus NEGATIVE  NEGATIVE Final     Studies/Results: No results  found.  Assessment/Plan: 1. Urinary retention status post TURP. His prostate has been completely resected. He's had a lot of difficulty with continued bleeding do to his myelodysplasia and thrombocytopenia. I had taken back to the operating room to attempt clot evacuation and fulgurate any bleeding and I did find one small area that appeared to been bleeding. After that his urine has cleared. I took his catheter out yesterday but he was unable to void. I suspect that all the clot that had formed in his prostatic urethra do to the Amicar that he was on likely is causing some obstruction from going to leave his catheter in for a week and then remove it for a voiding trial here in the office.  2. Thrombocytopenia: He has required multiple platelet transfusions to control hematuria as well as more recently epistaxis. Both of these appear controlled now. He has been given Amicar to help control the bleeding. Dr. Sofie Hartigan is his oncologist and has been assisting with his care. His assistance has been greatly appreciated.  3. Nausea and vomiting: This is a new problem that just developed yesterday. I will check a KUB. He will likely also need to have further blood work.  4. Syncopal episode: He has a history of 39% bilateral carotid artery stenosis by Doppler and a history of TIA in the past. He has obviously been off of his  Coumadin. He does not appear to have any new neurologic deficits however I am going to ask the hospitalist to see and evaluate him further. They have kindly agreed.His cardiologist is Dr. Antoine Poche.  5. Renal insufficiency: This is chronic and his creatinine has remained essentially stable during his hospitalization. His nephrologist is Dr. Caryn Section.   Obtain CMET  Hospitalist consultation  Continued hospitalization and Foley catheterization.   LOS: 11 days   Tracy [name] C 02/01/2012, 7:44 AM

## 2012-02-01 NOTE — Progress Notes (Signed)
EEG COMPLETED

## 2012-02-01 NOTE — Procedures (Signed)
History: 78 yo M with brief episode of syncope/unresponsiveness this morning.   Sedation: None  Background: The amjority of this recording was in sleep or drowsiness. There is a breif period of wakefullness during which a PDR of 8 hz was seen. There is genrealized delta associated with drowsiness, but this appears to abate during his arousal.   Photic stimulation: Physiologic driving is not performed  EEG Diagnosis: 1) Normal sleep and waking eeg  Clinical Interpretation: This normal EEG is recorded in the waking and sleep state. There was no seizure or seizure predisposition recorded on this study.   Ritta Slot, MD [facility] [phone]  If 7pm- 7am, please page neurology on call at [phone].

## 2012-02-02 DIAGNOSIS — I5042 Chronic combined systolic (congestive) and diastolic (congestive) heart failure: Secondary | ICD-10-CM

## 2012-02-02 DIAGNOSIS — I10 Essential (primary) hypertension: Secondary | ICD-10-CM

## 2012-02-02 DIAGNOSIS — I509 Heart failure, unspecified: Secondary | ICD-10-CM

## 2012-02-02 DIAGNOSIS — E875 Hyperkalemia: Secondary | ICD-10-CM

## 2012-02-02 LAB — BASIC METABOLIC PANEL
BUN: 41 mg/dL — ABNORMAL HIGH (ref 6–23)
CO2: 13 mEq/L — ABNORMAL LOW (ref 19–32)
Chloride: 108 mEq/L (ref 96–112)
Creatinine, Ser: 5.08 mg/dL — ABNORMAL HIGH (ref 0.50–1.35)
Glucose, Bld: 90 mg/dL (ref 70–99)

## 2012-02-02 LAB — TYPE AND SCREEN: Unit division: 0

## 2012-02-02 LAB — HEMOGLOBIN AND HEMATOCRIT, BLOOD
HCT: 26.4 % — ABNORMAL LOW (ref 39.0–52.0)
Hemoglobin: 8.3 g/dL — ABNORMAL LOW (ref 13.0–17.0)

## 2012-02-02 LAB — CLOSTRIDIUM DIFFICILE BY PCR: Toxigenic C. Difficile by PCR: NEGATIVE

## 2012-02-02 MED ORDER — SODIUM CHLORIDE 0.9 % IV SOLN
INTRAVENOUS | Status: AC
  Administered 2012-02-02: 10:00:00 via INTRAVENOUS

## 2012-02-02 MED ORDER — METOPROLOL TARTRATE 50 MG PO TABS
50.0000 mg | ORAL_TABLET | Freq: Two times a day (BID) | ORAL | Status: DC
  Administered 2012-02-02 – 2012-02-03 (×3): 50 mg via ORAL
  Filled 2012-02-02 (×5): qty 1

## 2012-02-02 MED ORDER — SODIUM CHLORIDE 0.9 % IV SOLN
INTRAVENOUS | Status: DC
  Administered 2012-02-02 – 2012-02-03 (×2): via INTRAVENOUS

## 2012-02-02 NOTE — Progress Notes (Signed)
Physical Therapy Treatment Patient Details Name: Tracy [name] MRN: [id number] DOB: 11/15/1919 Today's Date: 02/02/2012 Time: 1191-4782 PT Time Calculation (min): 30 min  PT Assessment / Plan / Recommendation Comments on Treatment Session  Per RN and notes, pt has had two episodes of "passing out" and becoming unresponsive for several moments.  Assisted pt to 3in1 with BP 84/48 after sitting for approx 1 min with no s/s of dizziness, rechecked after approx 2 mins with BP at 98/68.  After another 2-3 mins, assisted pt with self care, however pt states he needed to use restroom again, assisted back to 3in1 where pt became quiety and slow to respond.  Assited pt back to bed and pt became more responsive with BP at 119/71.  Then assisted pt back to chair with BP at 108/61, O2 at 94% and HR 86.  RN notified.  At this time, pt unsafe to D/C home and may need to consider ST SNF placement for increased safety.      Follow Up Recommendations  Home health PT;SNF;Supervision/Assistance - 24 hour     Does the patient have the potential to tolerate intense rehabilitation     Barriers to Discharge        Equipment Recommendations  None recommended by PT    Recommendations for Other Services    Frequency Min 3X/week   Plan Discharge plan needs to be updated    Precautions / Restrictions Precautions Precautions: Fall Precaution Comments: check BP, had two episodes of passing out Restrictions Weight Bearing Restrictions: No   Pertinent Vitals/Pain No pain    Mobility  Bed Mobility Bed Mobility: Supine to Sit Supine to Sit: 5: Supervision Details for Bed Mobility Assistance: Supervision for safety  Transfers Transfers: Sit to Stand;Stand to Sit Sit to Stand: 4: Min assist;3: Mod assist;With upper extremity assist;With armrests;From bed;From chair/3-in-1 Stand to Sit: 4: Min assist;With upper extremity assist;With armrests;To chair/3-in-1;To bed Details for Transfer Assistance: Performed  several times in order to get pt to 3in1 and assist with cleaning pt.  Also performed from bed x 2 due to sudden dizziness.   Ambulation/Gait Ambulation/Gait Assistance: 1: +2 Total assist Ambulation/Gait: Patient Percentage: 60% Ambulation Distance (Feet): 5 Feet (then another 5' ) Assistive device: Straight cane Ambulation/Gait Assistance Details: Took some steps from bed to 3in1 then to bed then to recliner.  Had pt use SPC for safety and was +2 for safety due to recent episodes of orthostatic hypotension.  Gait Pattern: Step-to pattern;Decreased stride length Gait velocity: decreased and very unsteady    Exercises     PT Diagnosis:    PT Problem List:   PT Treatment Interventions:     PT Goals Acute Rehab PT Goals PT Goal Formulation: With patient Time For Goal Achievement: 02/03/12 Potential to Achieve Goals: Good Pt will go Supine/Side to Sit: with modified independence;with HOB 0 degrees PT Goal: Supine/Side to Sit - Progress: Revised due to lack of progress Pt will go Sit to Stand: with supervision PT Goal: Sit to Stand - Progress: Revised due to lack of progress Pt will go Stand to Sit: with supervision PT Goal: Stand to Sit - Progress: Revised due to lack of progress Pt will Ambulate: >150 feet;with least restrictive assistive device;with supervision PT Goal: Ambulate - Progress: Revised due to lack of progress Pt will Go Up / Down Stairs: Flight;with least restrictive assistive device;with supervision (if pt D/Cs home) PT Goal: Up/Down Stairs - Progress: Revised due to lack of progress  Visit Information  Last PT Received On: 02/02/12 Assistance Needed: +2 (safety due to orthostasis)    Subjective Data  Subjective: I want to walk up and down the hall Patient Stated Goal: return home   Cognition  Overall Cognitive Status: Appears within functional limits for tasks assessed/performed Arousal/Alertness: Awake/alert Orientation Level: Appears intact for tasks  assessed Behavior During Session: [facility] for tasks performed Cognition - Other Comments: Seems somewhat unconcerned about dizziness and felt as through he should still walk down the hall    Balance     End of Session PT - End of Session Equipment Utilized During Treatment: Gait belt Activity Tolerance:  (Limited by dizziness/light headedness. ) Patient left: in chair;with call bell/phone within reach;with chair alarm set Nurse Communication: Mobility status (BP during session)   GP     Page, Meribeth [name] 02/02/2012, 9:22 AM

## 2012-02-02 NOTE — Consult Note (Signed)
[facility] PROGRESS NOTE  Tracy [name] AVW:[id number] DOB: 1919/05/28 DOA: 01/21/2012 PCP: [name], MD  Assessment/Plan: Unresponsiveness, syncope  Most likely secondary to vasovagal versus orthostasis; concerns for abnormalities on his rhythm with hx of A.fib and abnormal electrolytes.  Patient is alert and awake. Episode dizziness and lethargy when getting up this am. Pt orthostatic  CT head without contrast which showed no acute intracranial findings  EEG on 02/01/12 normal Patient has had 2-D echo done in September 2013 which showed ejection fraction of 50%  Will resume tele monitor vitals q4hr x3. Increase IV fluids   Nausea/vomiting: likely related to recent antibiotics use and kayexalate.   Will provide PRN IV anti-emetics.   Will provide IV fluids.   Monitor intake and output.   Diarrhea: Pt did receive 1 dose kayexalate yesterday. Recently on vanc, cipro and ancef. c diff by PCR negative.   Acute blood loss anemia  Hemoglobin today is 8.3 s/p 1 unit PRBC's on 02/01/12  Platelet count is stable at 58 (CBC on 01/31/12) Please note that patient has received previous transfusions including total of 5 units of PRBCs and 2 unit of platelets since admission  Pt received  Lasix after transfusion 02/01/12  Monitor for fluid overload Wt 89.2 up from 87.7kg. Volume status -18L but i doubt the accuracy of this.   Chronic systolic and diastolic congestive heart failure  Elevated proBNP on admission due to diastolic CHF as well as CKD  CHF compensated at this time. Wt up. Will monitor closely given need for fluid resuscitation. Will check BNP in am   Hyperkalemia  given 1 dose of Kayexalate 02/01/12 Significant Diarrhea this am.   potassium 4.8 this am.  Will check BMP in the morning   Essential hypertension, benign  Continue Imdur 30 mg daily and metoprolol 50 mg twice daily Somewhat hypotensive and orthostatic today. Will give fluids and provide holding  parameters for Blood pressure medications.   Atrial fibrillation  Rate controlled with metoprolol  Not on Coumadin    Acute on chronic kidney failure stage IV  Creatinine up from 3.9 to 5.08. Likely related to dehydration and hypotension and lasix.  Will give IV fluids. Hold any nephrotoxins. Monitor urine output.  Foley draining and bladder scan last night with 0cc.   MDS (myelodysplastic syndrome)  Chronic anemia and thrombocytopenia  Oncology is following No transfusion needed at this point.    Code Status: full Family Communication: spoke with daughter on phone. Spoke with son Meridith Treanor who is point person. Cell # [phone]. Disposition Plan: Home when medically stable with HHPT at discharge  Antibiotics: None  HPI/Subjective: Up in chair vomiting yellowish emesis. Complains nausea/diarrhea. Denies pain. VSS  Objective: Filed Vitals:   02/01/12 1859 02/01/12 2103 02/01/12 2152 02/02/12 0645  BP: 132/75 107/51 136/85 112/68  Pulse: 74 64 78 79  Temp: 97.8 F (36.6 C) 98.2 F (36.8 C)  97.9 F (36.6 C)  TempSrc: Oral Oral  Oral  Resp: 18 19  18   Height:      Weight:    89.2 kg (196 lb 10.4 oz)  SpO2:  95% 93% 95%    Intake/Output Summary (Last 24 hours) at 02/02/12 0841 Last data filed at 02/02/12 0649  Gross per 24 hour  Intake  386.5 ml  Output    275 ml  Net  111.5 ml   Filed Weights   01/31/12 0714 02/01/12 0604 02/02/12 0645  Weight: 85.6 kg (188 lb 11.4 oz) 87.771 kg (  193 lb 8 oz) 89.2 kg (196 lb 10.4 oz)    Exam:   General:  Awake alert oriented x3  Cardiovascular: irregular irregular No MGR trace LEE  Respiratory: normal effort. BSCTAB No wheeze, rhonchi  Abdomen: round, soft non-tender to palpation. +BS  Neuro: non focal deficit; AAOX3; MS 4/5 bilaterally due to poor effort  Data Reviewed: Basic Metabolic Panel:  Lab 02/01/12 1610 01/31/12 0452 01/30/12 0525  NA 135 137 135  K 5.5* 4.8 4.8  CL 110 114* 111  CO2 14* 15* 15*    GLUCOSE 97 94 94  BUN 37* 32* 33*  CREATININE 3.92* 2.84* 2.52*  CALCIUM 7.6* 7.6* 7.6*  MG -- -- --  PHOS -- -- --   Liver Function Tests:  Lab 02/01/12 0815  AST 52*  ALT <5  ALKPHOS 80  BILITOT 0.6  PROT 5.1*  ALBUMIN 2.2*   CBC:  Lab 02/02/12 0417 02/01/12 0455 01/31/12 0452 01/30/12 1624 01/30/12 0525  WBC -- -- 3.3* -- 2.9*  NEUTROABS -- -- -- -- --  HGB 8.3* 7.8* 8.1*7.7* 8.2* 8.0*  HCT 26.4* 25.1* 25.6*24.3* 26.0* 25.2*  MCV -- -- 95.7 -- 94.4  PLT -- -- 58* -- 58*   BNP (last 3 results)  Basename 01/22/12 2200 12/08/11 1600 08/04/11 1241  PROBNP 16466.0* 9654.0* 601.0*     Studies: Dg Abd 1 View  02/01/2012  *RADIOLOGY REPORT*  Clinical Data: Nausea and vomiting  ABDOMEN - 1 VIEW  Comparison: Abdomen and pelvis CT, 10/13/2011  Findings: There is a normal bowel gas pattern.  No evidence of obstruction.  No gross free air.  There are calcifications along the abdominal aorta, iliac arteries and splenic artery.  There are no convincing renal or ureteral stones.  The soft tissues are otherwise unremarkable.  There is opacity at the left lung base likely a combination of atelectasis or infiltrate and pleural fluid.  No osteoblastic or osteolytic lesions.  IMPRESSION: No acute findings.  No evidence of a bowel obstruction.   Original Report Authenticated By: Amie Portland, M.D.    Ct Head Wo Contrast  02/01/2012  *RADIOLOGY REPORT*  Clinical Data: 78 year old male with syncopal episode.  History of TIA.  CT HEAD WITHOUT CONTRAST  Technique:  Contiguous axial images were obtained from the base of the skull through the vertex without contrast.  Comparison: 11/27/2005.  Findings: Chronic ethmoid and sphenoid sinus mucosal thickening not significantly changed.  Other Visualized paranasal sinuses and mastoids are clear. No acute osseous abnormality identified.  Stable postoperative changes to the left globe.  Interval right cataract surgery. Stable scalp soft tissues.   Calcified atherosclerosis at the skull base.  Interval generalized cerebral volume loss, volume remains within normal limits for age. No ventriculomegaly.  Dural calcifications. No midline shift, mass effect, or evidence of mass lesion.  No acute intracranial hemorrhage identified.  Progression of cerebral white matter hypodensity which is confluent and patchy.  Chronic right thalamic lacunar infarct is stable.  Small left caudate head lacunar infarct is new but appears chronic. No evidence of cortically based acute infarction identified.  No suspicious intracranial vascular hyperdensity.  IMPRESSION: 1. No acute intracranial abnormality. 2.  Some progression of small vessel disease and cerebral volume loss since 2007. 3.  Chronic mild paranasal sinus mucosal thickening.   Original Report Authenticated By: Erskine Speed, M.D.     Scheduled Meds:   . aminocaproic acid  500 mg Oral Q8H  . [COMPLETED] furosemide  20 mg Intravenous Once  .  isosorbide mononitrate  30 mg Oral QPM  . metoprolol  50 mg Oral BID  . [COMPLETED] sodium polystyrene  30 g Oral Once   Continuous Infusions:   . sodium chloride 10 mL/hr at 01/28/12 1607  . sodium chloride irrigation 3,000 mL (01/28/12 2101)    Principal Problem:  *Anemia associated with acute blood loss Active Problems:  Essential hypertension, benign  Atrial fibrillation  CKD (chronic kidney disease), stage IV  MDS (myelodysplastic syndrome)  Hyperkalemia  Elevated brain natriuretic peptide (BNP) level  Chronic combined systolic and diastolic CHF (congestive heart failure)    Time spent: 45 minutes    [facility] M NP [facility]  If 8PM-8AM, please contact night-coverage at [email or web address], password [email or web address] 02/02/2012, 8:41 AM  LOS: 12 days     Patient seen and examined; agree with note as written above by NP Toya Smothers. Patient with severe diarrhea and N/V s/p kayexalate; which along with recent anemia has make him really orthostatic and  dehydrated. Will provide IVF's and will follow clinical improvements. Acute on chronic renal failure secondary to decrease perfusion; with hypotensive episode and dehydration. Will follow BMET in am.  Durga Saldarriaga [phone]

## 2012-02-02 NOTE — Progress Notes (Signed)
5 Days Post-Op  Subjective: Tracy [name] is awake and alert this morning with no complaints. He specifically no abnormal discomfort. He's concerned about not having been out of bed to a significant degree during his hospitalization and weakness that may have developed.He is otherwise without complaint.  Objective: Vital signs in last 24 hours: Temp:  [97.2 F (36.2 C)-98.3 F (36.8 C)] 97.9 F (36.6 C) (11/06 0645) Pulse Rate:  [64-90] 79  (11/06 0645) Resp:  [18-19] 18  (11/06 0645) BP: (101-136)/(51-85) 112/68 mmHg (11/06 0645) SpO2:  [93 %-96 %] 95 % (11/06 0645) Weight:  [89.2 kg (196 lb 10.4 oz)] 89.2 kg (196 lb 10.4 oz) (11/06 0645)  Intake/Output from previous day: 11/05 0701 - 11/06 0700 In: 746.5 [P.O.:360; Blood:384.5; IV Piggyback:2] Out: 275 [Urine:275] Intake/Output this shift: Total I/O In: -  Out: 150 [Urine:150]  Physical Exam:  His Foley catheter is draining. He is not on continuous bladder irrigation at this time. The urine appears to be blood-tinged.There are no clots seen.  Lab Results:  Basename 02/02/12 0417 02/01/12 0455 01/31/12 0452  HGB 8.3* 7.8* 8.1*7.7*  HCT 26.4* 25.1* 25.6*24.3*   BMET  Basename 02/01/12 0815 01/31/12 0452  NA 135 137  K 5.5* 4.8  CL 110 114*  CO2 14* 15*  GLUCOSE 97 94  BUN 37* 32*  CREATININE 3.92* 2.84*  CALCIUM 7.6* 7.6*   No results found for this basename: LABPT:3,INR:3 in the last 72 hours No results found for this basename: LABURIN:1 in the last 72 hours Results for orders placed during the hospital encounter of 01/19/12  SURGICAL PCR SCREEN     Status: Normal   Collection Time   01/19/12 10:54 AM      Component Value Range Status Comment   MRSA, PCR NEGATIVE  NEGATIVE Final    Staphylococcus aureus NEGATIVE  NEGATIVE Final     Studies/Results: Dg Abd 1 View  02/01/2012  *RADIOLOGY REPORT*  Clinical Data: Nausea and vomiting  ABDOMEN - 1 VIEW  Comparison: Abdomen and pelvis CT, 10/13/2011  Findings: There  is a normal bowel gas pattern.  No evidence of obstruction.  No gross free air.  There are calcifications along the abdominal aorta, iliac arteries and splenic artery.  There are no convincing renal or ureteral stones.  The soft tissues are otherwise unremarkable.  There is opacity at the left lung base likely a combination of atelectasis or infiltrate and pleural fluid.  No osteoblastic or osteolytic lesions.  IMPRESSION: No acute findings.  No evidence of a bowel obstruction.   Original Report Authenticated By: Amie Portland, M.D.    Ct Head Wo Contrast  02/01/2012  *RADIOLOGY REPORT*  Clinical Data: 78 year old male with syncopal episode.  History of TIA.  CT HEAD WITHOUT CONTRAST  Technique:  Contiguous axial images were obtained from the base of the skull through the vertex without contrast.  Comparison: 11/27/2005.  Findings: Chronic ethmoid and sphenoid sinus mucosal thickening not significantly changed.  Other Visualized paranasal sinuses and mastoids are clear. No acute osseous abnormality identified.  Stable postoperative changes to the left globe.  Interval right cataract surgery. Stable scalp soft tissues.  Calcified atherosclerosis at the skull base.  Interval generalized cerebral volume loss, volume remains within normal limits for age. No ventriculomegaly.  Dural calcifications. No midline shift, mass effect, or evidence of mass lesion.  No acute intracranial hemorrhage identified.  Progression of cerebral white matter hypodensity which is confluent and patchy.  Chronic right thalamic lacunar infarct is  stable.  Small left caudate head lacunar infarct is new but appears chronic. No evidence of cortically based acute infarction identified.  No suspicious intracranial vascular hyperdensity.  IMPRESSION: 1. No acute intracranial abnormality. 2.  Some progression of small vessel disease and cerebral volume loss since 2007. 3.  Chronic mild paranasal sinus mucosal thickening.   Original Report  Authenticated By: Erskine Speed, M.D.     Assessment/Plan: Hematuria: His catheter is draining and he is not on continuous bladder irrigation. He had clots in his prostatic urethra that could not be removed at that time of his last cystoscopy and a few clots in his bladder. Because he had received Amicar these clots are quite tenacious and extremely difficult to remove. He failed a voiding trial yesterday and his catheter was reinserted. I think the clots in his prostatic fossa and bladder are contributing to his inability to void as he has been widely resected and has a completely open prostatic fossa at this time other than the clots. I have discussed with the patient the fact that I would like to leave his catheter indwelling to allow the clots to lyse and pass with a voiding trial in my office In about a week. His hemoglobin is stable.  Recent syncopal episode: There does not appear to be any significant pathology and that has been detected so far by his sleep EEG or CT scan of the head. It certainly could of been due to to his relative anemia with resulting postural hypotension. Dr. Alene Mires assistance Is greatly appreciated.  He was noted to be slightly hyperkalemic yesterday and was treated for this. He otherwise seems to be doing well and has been evaluated thoroughly by Dr. Elisabeth Pigeon which is greatly appreciated.   He is going to work on ambulating the hall today.  My hope is that he can be discharged tomorrow.   LOS: 12 days   Marquia Costello C 02/02/2012, 6:51 AM

## 2012-02-02 NOTE — Progress Notes (Signed)
Physical Therapy Treatment Patient Details Name: Tracy [name] MRN: [id number] DOB: 1919-12-24 Today's Date: 02/02/2012 Time: 4098-1191 PT Time Calculation (min): 20 min  PT Assessment / Plan / Recommendation Comments on Treatment Session  Pt able to ambulate in hallway during pm session without c/o dizziness, however noted increased fatigue with several rest breaks required (sitting) and continues to be unsteady.  Would recommend [facility] SNF for pt at this time.     Follow Up Recommendations  Home health PT;SNF;Supervision/Assistance - 24 hour     Does the patient have the potential to tolerate intense rehabilitation     Barriers to Discharge        Equipment Recommendations  None recommended by PT    Recommendations for Other Services    Frequency Min 3X/week   Plan Discharge plan remains appropriate    Precautions / Restrictions Precautions Precautions: Fall Precaution Comments: check BP, had two episodes of passing out Restrictions Weight Bearing Restrictions: No   Pertinent Vitals/Pain Pt did have some c/o abdominal pain once back in bed.     Mobility  Bed Mobility Bed Mobility: Sit to Supine Sit to Supine: 4: Min assist Details for Bed Mobility Assistance: Some assist for LLE into bed with cues for safety.  Transfers Transfers: Sit to Stand;Stand to Sit Sit to Stand: 4: Min assist;With upper extremity assist;With armrests;From chair/3-in-1 Stand to Sit: 4: Min assist;With upper extremity assist;With armrests;To chair/3-in-1;To bed Details for Transfer Assistance: Performed x 3 to allow rest breaks when ambulating.  Cues for hand placement and safety, esp when sitting due to pt wanting to sit without ensuring chair is behind him.  Ambulation/Gait Ambulation/Gait Assistance: 1: +2 Total assist Ambulation/Gait: Patient Percentage: 70% Ambulation Distance (Feet): 40 Feet (then 30x1, 10 x 1) Assistive device: Rolling walker Ambulation/Gait Assistance Details: Assist to  steady throughout with cues for maintaing position inside of RW, esp with turns.  Pt continues to be very unsteady, however no c/o dizziness during pm session.  Gait Pattern: Step-to pattern;Decreased stride length    Exercises     PT Diagnosis:    PT Problem List:   PT Treatment Interventions:     PT Goals Acute Rehab PT Goals PT Goal Formulation: With patient Time For Goal Achievement: 02/03/12 Potential to Achieve Goals: Good Pt will go Sit to Stand: with supervision PT Goal: Sit to Stand - Progress: Progressing toward goal Pt will go Stand to Sit: with supervision PT Goal: Stand to Sit - Progress: Progressing toward goal Pt will Ambulate: >150 feet;with least restrictive assistive device;with supervision PT Goal: Ambulate - Progress: Progressing toward goal  Visit Information  Last PT Received On: 02/02/12 Assistance Needed: +2    Subjective Data  Subjective: I'm ok Patient Stated Goal: return home   Cognition  Overall Cognitive Status: Appears within functional limits for tasks assessed/performed Arousal/Alertness: Awake/alert Orientation Level: Appears intact for tasks assessed Behavior During Session: [facility] for tasks performed    Balance     End of Session PT - End of Session Equipment Utilized During Treatment: Gait belt Activity Tolerance: Patient limited by fatigue Patient left: in bed;with call bell/phone within reach;with bed alarm set Nurse Communication: Mobility status   Tracy [name]     Tracy [name], Tracy [name] 02/02/2012, 4:29 PM

## 2012-02-03 ENCOUNTER — Encounter (HOSPITAL_COMMUNITY): Payer: Medicare Other

## 2012-02-03 DIAGNOSIS — R799 Abnormal finding of blood chemistry, unspecified: Secondary | ICD-10-CM

## 2012-02-03 LAB — BASIC METABOLIC PANEL
CO2: 12 mEq/L — ABNORMAL LOW (ref 19–32)
Glucose, Bld: 85 mg/dL (ref 70–99)
Potassium: 4.5 mEq/L (ref 3.5–5.1)
Sodium: 136 mEq/L (ref 135–145)

## 2012-02-03 LAB — CBC
Hemoglobin: 8 g/dL — ABNORMAL LOW (ref 13.0–17.0)
Platelets: 68 10*3/uL — ABNORMAL LOW (ref 150–400)
RBC: 2.7 MIL/uL — ABNORMAL LOW (ref 4.22–5.81)
WBC: 3.4 10*3/uL — ABNORMAL LOW (ref 4.0–10.5)

## 2012-02-03 LAB — PRO B NATRIURETIC PEPTIDE: Pro B Natriuretic peptide (BNP): 29791 pg/mL — ABNORMAL HIGH (ref 0–450)

## 2012-02-03 LAB — MAGNESIUM: Magnesium: 1.9 mg/dL (ref 1.5–2.5)

## 2012-02-03 NOTE — Progress Notes (Signed)
Foley with decreased urine output. Hand irrigated foley with clot removal. Pt also stated that he has only had 1 glass of water today and just finished  Ensure. Education provided regarding drinking fluids. Pt and spouse stated that he understood. Will continue to monitor. Edwinna Rochette A

## 2012-02-03 NOTE — Consult Note (Signed)
[facility] PROGRESS NOTE  Tracy [name] ZOX:[id number] DOB: 1919/04/18 DOA: 01/21/2012 PCP: [name], MD  Assessment/Plan: Unresponsiveness, syncope  Most likely secondary to vasovagal versus orthostasis; concerns for abnormalities on his rhythm with hx of A.fib    Pt orthostatic 02/02/12. Given IV fluids. Will recheck orthostatic VS today. CT head without contrast which showed no acute intracranial findings  EEG on 02/01/12 normal  Patient has had 2-D echo done in September 2013 which showed ejection fraction of 50%   Nausea/vomiting: likely related to recent antibiotics use and kayexalate. No further episodes to date.  continue PRN IV anti-emetics.  IV fluids at lower rate (still with Poor intake) Monitor intake and output.   Diarrhea: Pt did receive 1 dose kayexalate 11/5. Recently on vanc, cipro and ancef. c diff by PCR negative. Loose stool x3 since yesterday.   Acute blood loss anemia  Hemoglobin today is 8.0 s/p 1 unit PRBC's on 02/01/12  Platelet count is stable at 68 (CBC on 01/31/12)  Please note that patient has received previous transfusions including total of 5 units of PRBCs and 2 unit of platelets since admission  Continue to monitor for fluid overload  Wt 91.5kg up from 87.7kg. Volume status -17L but i doubt the accuracy of this.  Chronic systolic and diastolic congestive heart failure  Worsening proBNP  due to worsening kidney function  CHF compensated at this time.  Wt up. Does not appear overloaded i.e. No SOB, no rales. Monitor closely  Hyperkalemia  given 1 dose of Kayexalate 02/01/12  potassium 4.5 this am.  Will check BMP in the morning  Essential hypertension, benign  Continue Imdur 30 mg daily and metoprolol 50 mg twice daily  Better control. SBP range 112-120   Atrial fibrillation  Rate controlled with metoprolol  Not on Coumadin 9 beat run V-tach during night. Did not awaken , was asymptomatic. Continue tele Electrolytes WNL.  Acute on  chronic kidney failure stage IV  Creatinine up from 3.9 to 6.3 today. Likely related to dehydration and hypotension and lasix. Urine output WNL after IV fluids given yesterday. Hold any nephrotoxins. Continue to monitor urine output. Check bmet in am. If creatinine continues to trend up tomorrow, will check renal US and involve renal consult.   MDS (myelodysplastic syndrome)  Chronic anemia and thrombocytopenia  Oncology is following  Hg 8.0 today. Monitor closely and consider transfusion if Hg <8.0  Physical deconditioning -Will continue PT and when closer to discharge reevaluate and determine if he will benefit of SNF placement for physical rehab or not; patient will think about that possibility; son is in agreement.   Code Status: full Family Communication: son Tracy [name] by phone [phone] Disposition Plan: will likely need SNF for short term physical rehab   Antibiotics:  none  HPI/Subjective: Lying in bed eyes closed arouses to verbal stimuli. Denies pain/discomfort. Feeling weak and w/o appetite.  Objective: Filed Vitals:   02/02/12 2029 02/03/12 0241 02/03/12 0414 02/03/12 0830  BP: 123/64 112/64 119/59   Pulse: 84 71 66   Temp: 97.8 F (36.6 C) 98.4 F (36.9 C) 98.3 F (36.8 C)   TempSrc: Oral Oral Oral   Resp: 18 18 15    Height:      Weight:   91.5 kg (201 lb 11.5 oz)   SpO2: 93% 91% 92% 92%    Intake/Output Summary (Last 24 hours) at 02/03/12 1037 Last data filed at 02/03/12 0727  Gross per 24 hour  Intake 1186.67 ml  Output  451 ml  Net 735.67 ml   Filed Weights   02/01/12 0604 02/02/12 0645 02/03/12 0414  Weight: 87.771 kg (193 lb 8 oz) 89.2 kg (196 lb 10.4 oz) 91.5 kg (201 lb 11.5 oz)    Exam:   General:  Awake alert x3  Cardiovascular: irregularly irregular No MGR  Respiratory: normal effort BSCTAB No wheeze, no crackles  Abdomen: soft +BS non-tender to palpation.   Data Reviewed: Basic Metabolic Panel:  Lab 02/03/12 1610 02/02/12  0417 02/01/12 0815 01/31/12 0452 01/30/12 0525  NA 136 135 135 137 135  K 4.5 4.8 5.5* 4.8 4.8  CL 109 108 110 114* 111  CO2 12* 13* 14* 15* 15*  GLUCOSE 85 90 97 94 94  BUN 44* 41* 37* 32* 33*  CREATININE 6.35* 5.08* 3.92* 2.84* 2.52*  CALCIUM 7.4* 7.7* 7.6* 7.6* 7.6*  MG 1.9 -- -- -- --  PHOS -- -- -- -- --   Liver Function Tests:  Lab 02/01/12 0815  AST 52*  ALT <5  ALKPHOS 80  BILITOT 0.6  PROT 5.1*  ALBUMIN 2.2*   CBC:  Lab 02/03/12 0500 02/02/12 0417 02/01/12 0455 01/31/12 0452 01/30/12 1624 01/30/12 0525  WBC 3.4* -- -- 3.3* -- 2.9*  NEUTROABS -- -- -- -- -- --  HGB 8.0* 8.3* 7.8* 8.1*7.7* 8.2* --  HCT 25.5* 26.4* 25.1* 25.6*24.3* 26.0* --  MCV 94.4 -- -- 95.7 -- 94.4  PLT 68* -- -- 58* -- 58*   BNP (last 3 results)  Basename 02/03/12 0500 01/22/12 2200 12/08/11 1600  PROBNP 29791.0* 16466.0* 9654.0*   CBG: No results found for this basename: GLUCAP:5 in the last 168 hours  Recent Results (from the past 240 hour(s))  CLOSTRIDIUM DIFFICILE BY PCR     Status: Normal   Collection Time   02/02/12  9:35 AM      Component Value Range Status Comment   C difficile by pcr NEGATIVE  NEGATIVE Final      Studies: Ct Head Wo Contrast  02/01/2012  *RADIOLOGY REPORT*  Clinical Data: 78 year old male with syncopal episode.  History of TIA.  CT HEAD WITHOUT CONTRAST  Technique:  Contiguous axial images were obtained from the base of the skull through the vertex without contrast.  Comparison: 11/27/2005.  Findings: Chronic ethmoid and sphenoid sinus mucosal thickening not significantly changed.  Other Visualized paranasal sinuses and mastoids are clear. No acute osseous abnormality identified.  Stable postoperative changes to the left globe.  Interval right cataract surgery. Stable scalp soft tissues.  Calcified atherosclerosis at the skull base.  Interval generalized cerebral volume loss, volume remains within normal limits for age. No ventriculomegaly.  Dural calcifications.  No midline shift, mass effect, or evidence of mass lesion.  No acute intracranial hemorrhage identified.  Progression of cerebral white matter hypodensity which is confluent and patchy.  Chronic right thalamic lacunar infarct is stable.  Small left caudate head lacunar infarct is new but appears chronic. No evidence of cortically based acute infarction identified.  No suspicious intracranial vascular hyperdensity.  IMPRESSION: 1. No acute intracranial abnormality. 2.  Some progression of small vessel disease and cerebral volume loss since 2007. 3.  Chronic mild paranasal sinus mucosal thickening.   Original Report Authenticated By: Erskine Speed, M.D.     Scheduled Meds:   . aminocaproic acid  500 mg Oral Q8H  . isosorbide mononitrate  30 mg Oral QPM  . metoprolol  50 mg Oral BID   Continuous Infusions:   Marland Kitchen [  EXPIRED] sodium chloride 125 mL/hr at 02/02/12 1000  . sodium chloride 50 mL/hr at 02/02/12 1455  . sodium chloride irrigation 3,000 mL (01/28/12 2101)    Time spent: 40 minutes    [facility] M NP [facility]  If 8PM-8AM, please contact night-coverage at [email or web address], password [employer] 02/03/2012, 10:37 AM  LOS: 13 days    Patient seen and examined; agree with assessment and plan as written on NP Toya Smothers note; patient with worsening renal failure; also with significant physical deconditioning. Will continue gentle hydration (no sings of fluid overload; BNP elevated due to renal failure). At this point no further transfusion needed, Hgb 8.0 (consider mild hemodilution due to IVF's given as part of resuscitation as well).  Marney Treloar [phone]

## 2012-02-03 NOTE — Evaluation (Signed)
Occupational Therapy Evaluation Patient Details Name: Tracy [name] MRN: [id number] DOB: October 01, 1919 Today's Date: 02/03/2012 Time: 4098-1191    OT Assessment / Plan / Recommendation Clinical Impression  78 y.o. male with h/o L TKA, a-fib, TIA, myelodysplastic syndrome admitted for TURP which was performed 01/21/12.  Pt will benefit from skilled OT to increase I with ADL activity and return to PLOF with ADL activity    OT Assessment  Patient needs continued OT Services    Follow Up Recommendations  Home health OT;SNF - likely will need SNF- but pt wants to go home   Barriers to Discharge Decreased caregiver support    Equipment Recommendations  Other (comment) (depending on progress and care at home)       Frequency  Min 2X/week    Precautions / Restrictions Precautions Precautions: Fall       ADL  Eating/Feeding: Performed;Set up Where Assessed - Eating/Feeding: Edge of bed Grooming: Performed;Wash/dry face;Set up Where Assessed - Grooming: Unsupported sitting Upper Body Bathing: Simulated;Minimal assistance Where Assessed - Upper Body Bathing: Unsupported sitting Lower Body Bathing: Simulated;Moderate assistance Where Assessed - Lower Body Bathing: Supported sit to stand Upper Body Dressing: Simulated;Minimal assistance Where Assessed - Upper Body Dressing: Unsupported sitting Lower Body Dressing: Simulated;Moderate assistance Where Assessed - Lower Body Dressing: Supported sit to Pharmacist, hospital: Performed;Moderate assistance Toilet Transfer Method: Sit to stand;Stand pivot Toilet Transfer Equipment: Bedside commode Toileting - Clothing Manipulation and Hygiene: Performed;Maximal assistance Where Assessed - Toileting Clothing Manipulation and Hygiene: Standing;Sit to stand from 3-in-1 or toilet Tub/Shower Transfer Method: Not assessed    OT Diagnosis: Generalized weakness  OT Problem List: Decreased strength;Decreased activity tolerance;Decreased safety  awareness OT Treatment Interventions: Self-care/ADL training;DME and/or AE instruction;Patient/family education   OT Goals Acute Rehab OT Goals OT Goal Formulation: With patient Time For Goal Achievement: 02/17/12 Potential to Achieve Goals: Good ADL Goals Pt Will Perform Grooming: with supervision;Standing at sink ADL Goal: Grooming - Progress: Goal set today Pt Will Perform Lower Body Dressing: with supervision;Sit to stand from chair ADL Goal: Lower Body Dressing - Progress: Goal set today Pt Will Transfer to Toilet: with supervision;Comfort height toilet ADL Goal: Toilet Transfer - Progress: Goal set today Pt Will Perform Toileting - Clothing Manipulation: with supervision;Standing ADL Goal: Toileting - Clothing Manipulation - Progress: Goal set today  Visit Information  Last OT Received On: 02/03/12       Prior Functioning     Home Living Lives With: Spouse Available Help at Discharge: Family;Available 24 hours/day Type of Home: House Home Access: Stairs to enter [employer] of Steps: 2 Entrance Stairs-Rails: Right Home Layout: Two level;Bed/bath upstairs Alternate Level Stairs-Number of Steps: 13 Alternate Level Stairs-Rails: Right Bathroom Shower/Tub: Engineer, manufacturing systems: Standard Home Adaptive Equipment: Straight cane;Tub transfer bench Prior Function Level of Independence: Independent with assistive device(s) (used SPC PTA) Able to Take Stairs?: Yes (pt has to stop to rest to get to 2nd floor, wants stair lift) Driving: Yes Vocation: Retired Musician: No difficulties Dominant Hand: Right            Cognition  Overall Cognitive Status: Appears within functional limits for tasks assessed/performed Arousal/Alertness: Awake/alert Orientation Level: Appears intact for tasks assessed Behavior During Session: [facility] for tasks performed    Extremity/Trunk Assessment Right Upper Extremity Assessment RUE  ROM/Strength/Tone: [facility] for tasks assessed Left Upper Extremity Assessment LUE ROM/Strength/Tone: WFL for tasks assessed     Mobility Bed Mobility Bed Mobility: Rolling Left;Left Sidelying to Sit Rolling Left:  4: Min guard Left Sidelying to Sit: 4: Min assist;With rails;HOB flat Supine to Sit: 4: Min assist;HOB flat;With rails Sitting - Scoot to Edge of Bed: 4: Min guard Transfers Transfers: Sit to Stand;Stand to Sit Sit to Stand: 3: Mod assist;From bed;From toilet;With upper extremity assist Stand to Sit: 3: Mod assist;To toilet;To chair/3-in-1;With upper extremity assist              End of Session  Pt left in bed with CNA present with call bell with in reach   GO     Jerzy Roepke, [place]ung 02/03/2012, 9:20 AM

## 2012-02-03 NOTE — Progress Notes (Signed)
6 Days Post-Op Subjective: Mr. Dogan reports no complaints of pain, shortness of breath or nausea. His only complaint is an itching sensation at the area of the urethral meatus. He was up yesterday walking but did become dizzy and also became nauseated. He did have a diarrheal bowel movement likely secondary to his Kayexalate.  Objective: Vital signs in last 24 hours: Temp:  [97.6 F (36.4 C)-98.4 F (36.9 C)] 98.3 F (36.8 C) (11/07 0414) Pulse Rate:  [66-84] 66  (11/07 0414) Resp:  [15-18] 15  (11/07 0414) BP: (84-123)/(48-68) 119/59 mmHg (11/07 0414) SpO2:  [91 %-96 %] 92 % (11/07 0414) Weight:  [91.5 kg (201 lb 11.5 oz)] 91.5 kg (201 lb 11.5 oz) (11/07 0414)  Intake/Output from previous day: 11/06 0701 - 11/07 0700 In: 1004.2 [P.O.:600; I.V.:404.2] Out: 451 [Urine:450; Stool:1] Intake/Output this shift:    Physical Exam:  General:alert, cooperative and no distress GI: not done and soft, non tender, normal bowel sounds, no palpable masses Foley catheter: He is draining pink urine with no clots.   Lab Results:  Basename 02/03/12 0500 02/02/12 0417 02/01/12 0455  HGB 8.0* 8.3* 7.8*  HCT 25.5* 26.4* 25.1*   BMET  Basename 02/03/12 0500 02/02/12 0417  NA 136 135  K 4.5 4.8  CL 109 108  CO2 12* 13*  GLUCOSE 85 90  BUN 44* 41*  CREATININE 6.35* 5.08*  CALCIUM 7.4* 7.7*   No results found for this basename: LABPT:3,INR:3 in the last 72 hours No results found for this basename: LABURIN:1 in the last 72 hours Results for orders placed during the hospital encounter of 01/21/12  CLOSTRIDIUM DIFFICILE BY PCR     Status: Normal   Collection Time   02/02/12  9:35 AM      Component Value Range Status Comment   C difficile by pcr NEGATIVE  NEGATIVE Final     Studies/Results: Dg Abd 1 View  02/01/2012  *RADIOLOGY REPORT*  Clinical Data: Nausea and vomiting  ABDOMEN - 1 VIEW  Comparison: Abdomen and pelvis CT, 10/13/2011  Findings: There is a normal bowel gas pattern.  No  evidence of obstruction.  No gross free air.  There are calcifications along the abdominal aorta, iliac arteries and splenic artery.  There are no convincing renal or ureteral stones.  The soft tissues are otherwise unremarkable.  There is opacity at the left lung base likely a combination of atelectasis or infiltrate and pleural fluid.  No osteoblastic or osteolytic lesions.  IMPRESSION: No acute findings.  No evidence of a bowel obstruction.   Original Report Authenticated By: Amie Portland, M.D.    Ct Head Wo Contrast  02/01/2012  *RADIOLOGY REPORT*  Clinical Data: 78 year old male with syncopal episode.  History of TIA.  CT HEAD WITHOUT CONTRAST  Technique:  Contiguous axial images were obtained from the base of the skull through the vertex without contrast.  Comparison: 11/27/2005.  Findings: Chronic ethmoid and sphenoid sinus mucosal thickening not significantly changed.  Other Visualized paranasal sinuses and mastoids are clear. No acute osseous abnormality identified.  Stable postoperative changes to the left globe.  Interval right cataract surgery. Stable scalp soft tissues.  Calcified atherosclerosis at the skull base.  Interval generalized cerebral volume loss, volume remains within normal limits for age. No ventriculomegaly.  Dural calcifications. No midline shift, mass effect, or evidence of mass lesion.  No acute intracranial hemorrhage identified.  Progression of cerebral white matter hypodensity which is confluent and patchy.  Chronic right thalamic lacunar infarct is  stable.  Small left caudate head lacunar infarct is new but appears chronic. No evidence of cortically based acute infarction identified.  No suspicious intracranial vascular hyperdensity.  IMPRESSION: 1. No acute intracranial abnormality. 2.  Some progression of small vessel disease and cerebral volume loss since 2007. 3.  Chronic mild paranasal sinus mucosal thickening.   Original Report Authenticated By: Erskine Speed, M.D.      Assessment/Plan: His hematuria is felt to be due to continued lysis of intravesical and intra-prostatic clots and formed during his bleeding. His hemoglobin remains relatively stable but it is low and I think that may be contributing to his weakness and dizziness upon standing.  His creatinine is rising although he continues to have a good urine output with no flank pain so I don't think this is post renal/obstructive in nature. His BNP is elevated as is his BUN indicating this is most likely prerenal.  He remains weak but did ambulate yesterday. He might benefit from more rbc's. It appears there may be some degree of CHF contributing to his weakness as well.  Continue hospitalization   LOS: 13 days   Sherolyn Trettin C 02/03/2012, 7:06 AM

## 2012-02-03 NOTE — Progress Notes (Addendum)
Pt had 9 beats V-tach. Pt asymptomatic and sleeping. VS stable. MD notified, new orders received. Will continue to monitor.  Earnest Conroy. Clelia Croft, RN

## 2012-02-04 ENCOUNTER — Inpatient Hospital Stay (HOSPITAL_COMMUNITY): Payer: Medicare Other

## 2012-02-04 DIAGNOSIS — N189 Chronic kidney disease, unspecified: Secondary | ICD-10-CM

## 2012-02-04 DIAGNOSIS — N179 Acute kidney failure, unspecified: Secondary | ICD-10-CM

## 2012-02-04 LAB — BASIC METABOLIC PANEL
BUN: 48 mg/dL — ABNORMAL HIGH (ref 6–23)
Chloride: 108 mEq/L (ref 96–112)
GFR calc Af Amer: 7 mL/min — ABNORMAL LOW (ref >90)
GFR calc non Af Amer: 6 mL/min — ABNORMAL LOW (ref >90)
Potassium: 4.9 mEq/L (ref 3.5–5.1)
Sodium: 135 mEq/L (ref 135–145)

## 2012-02-04 LAB — CALCIUM / CREATININE RATIO, URINE
Calcium, Ur: 6 mg/dL
Calcium/Creat.Ratio: 0.3
Creatinine, Urine: 19.3 mg/dL

## 2012-02-04 LAB — CBC
HCT: 27.5 % — ABNORMAL LOW (ref 39.0–52.0)
Hemoglobin: 8.5 g/dL — ABNORMAL LOW (ref 13.0–17.0)
MCHC: 30.9 g/dL (ref 30.0–36.0)
RDW: 18.6 % — ABNORMAL HIGH (ref 11.5–15.5)
WBC: 3.2 10*3/uL — ABNORMAL LOW (ref 4.0–10.5)

## 2012-02-04 MED ORDER — SODIUM CHLORIDE 0.9 % IV SOLN: 100.0000 mL | INTRAVENOUS | Status: DC | PRN

## 2012-02-04 MED ORDER — FUROSEMIDE 10 MG/ML IJ SOLN
100.0000 mg | Freq: Once | INTRAVENOUS | Status: AC
  Administered 2012-02-04: 100 mg via INTRAVENOUS
  Filled 2012-02-04: qty 10

## 2012-02-04 MED ORDER — METOPROLOL TARTRATE 25 MG PO TABS
25.0000 mg | ORAL_TABLET | Freq: Two times a day (BID) | ORAL | Status: DC
  Administered 2012-02-04 (×2): 25 mg via ORAL
  Filled 2012-02-04 (×6): qty 1

## 2012-02-04 MED ORDER — NEPRO/CARBSTEADY PO LIQD
237.0000 mL | ORAL | Status: DC | PRN
  Administered 2012-02-08: 237 mL via ORAL

## 2012-02-04 MED ORDER — ALTEPLASE 2 MG IJ SOLR: 2.0000 mg | Freq: Once | INTRAMUSCULAR | Status: AC | PRN

## 2012-02-04 MED ORDER — LIDOCAINE-PRILOCAINE 2.5-2.5 % EX CREA: 1.0000 "application " | TOPICAL_CREAM | CUTANEOUS | Status: DC | PRN

## 2012-02-04 MED ORDER — HEPARIN SODIUM (PORCINE) 1000 UNIT/ML DIALYSIS
1000.0000 [IU] | INTRAMUSCULAR | Status: DC | PRN
  Filled 2012-02-04: qty 1

## 2012-02-04 MED ORDER — PENTAFLUOROPROP-TETRAFLUOROETH EX AERO: 1.0000 "application " | INHALATION_SPRAY | CUTANEOUS | Status: DC | PRN

## 2012-02-04 MED ORDER — BACITRACIN-NEOMYCIN-POLYMYXIN OINTMENT TUBE
TOPICAL_OINTMENT | Freq: Three times a day (TID) | CUTANEOUS | Status: DC | PRN
  Filled 2012-02-04: qty 15

## 2012-02-04 MED ORDER — LIDOCAINE HCL (PF) 1 % IJ SOLN: 5.0000 mL | INTRAMUSCULAR | Status: DC | PRN

## 2012-02-04 NOTE — Progress Notes (Signed)
7 Days Post-Op Subjective: Patient reports becoming nauseated after he took his Amicar this morning. He has no flank or abdominal pain  Objective: Vital signs in last 24 hours: Temp:  [97.7 F (36.5 C)-98.2 F (36.8 C)] 97.7 F (36.5 C) (11/08 0522) Pulse Rate:  [64-80] 66  (11/08 0522) Resp:  [16] 16  (11/08 0522) BP: (117-153)/(60-83) 153/73 mmHg (11/08 0600) SpO2:  [92 %-96 %] 95 % (11/08 0522) Weight:  [91.7 kg (202 lb 2.6 oz)] 91.7 kg (202 lb 2.6 oz) (11/08 0522)  Intake/Output from previous day: 11/07 0701 - 11/08 0700 In: 1575 [P.O.:240; I.V.:1335] Out: 500 [Urine:500] Intake/Output this shift:    Physical Exam:  His Foley catheter is draining with no clots seen. He does not appear to be bright red blood but rather old blood.  Lab Results:  Basename 02/04/12 0441 02/03/12 0500 02/02/12 0417  HGB 8.5* 8.0* 8.3*  HCT 27.5* 25.5* 26.4*   BMET  Basename 02/04/12 0441 02/03/12 0500  NA 135 136  K 4.9 4.5  CL 108 109  CO2 12* 12*  GLUCOSE 112* 85  BUN 48* 44*  CREATININE 7.45* 6.35*  CALCIUM 7.7* 7.4*   No results found for this basename: LABPT:3,INR:3 in the last 72 hours No results found for this basename: LABURIN:1 in the last 72 hours Results for orders placed during the hospital encounter of 01/21/12  CLOSTRIDIUM DIFFICILE BY PCR     Status: Normal   Collection Time   02/02/12  9:35 AM      Component Value Range Status Comment   C difficile by pcr NEGATIVE  NEGATIVE Final     Studies/Results: No results found.  Assessment/Plan: He seems to be doing fairly well overall with occasional bouts of nausea. He's been getting oral Amicar and he's not having any active bleeding that I can see. The blood in his Foley catheter is almost certainly old clot that has formed in the bladder and prostatic urethra and his lysing. In addition his hemoglobin remained stable. Because the oral Amicar caused nausea I will stop that.  His creatinine is rising and it does not  appear he is on any nephrotoxic medications. I'm going to check a renal ultrasound and I think he'll need to be seen by nephrology.  1. Stop Amicar. 2. Renal ultrasound. 3. Nephrology consult.   LOS: 14 days   Aliviana Burdell C 02/04/2012, 8:10 AM

## 2012-02-04 NOTE — Progress Notes (Signed)
Patient had a 2.13 pause at 2219. Care coordinator RN notified and was not concerned. Patient asymptomatic and BP was 117/63.

## 2012-02-04 NOTE — Progress Notes (Signed)
Patient's pulse dropped into the 30's. Patient asymptomatic and BP was 115/73 and pulse was re-checked at 73.

## 2012-02-04 NOTE — Consult Note (Signed)
[facility] PROGRESS NOTE  Tracy [name] JXB:[id number] DOB: Apr 08, 1919 DOA: 01/21/2012 PCP: [name], MD  Assessment/Plan: Unresponsiveness, syncope  Most likely secondary to vasovagal versus orthostasis; concerns for abnormalities on his rhythm with hx of A.fib  Pt orthostatic 02/02/12. Given IV fluids. Orthostatics not done yesterday will check today and in am. BP better overall CT head without contrast which showed no acute intracranial findings  EEG on 02/01/12 normal  Patient has had 2-D echo done in September 2013 which showed ejection fraction of 50%  No syncopal episodes for 48 hours  Nausea/vomiting: nauseated this am after Amicar. Hg stable. Amicar discontinued per urology  continue PRN IV anti-emetics.  IV fluids at lower rate (still with Poor intake)  Monitor intake and output.   Diarrhea: Pt did receive 1 dose kayexalate 11/5. Recently on vanc, cipro and ancef. c diff by PCR negative. Loose stool in last 24hours X 2  Acute blood loss anemia  Hemoglobin today is 8.5 s/p 1 unit PRBC's on 02/01/12  Platelet count is stable at 62 (CBC on 01/31/12)  Please note that patient has received previous transfusions including total of 5 units of PRBCs and 2 unit of platelets since admission  Continue to monitor for fluid overload  Wt 91.7kg up from 87.7kg on admission. Volume status -16L but i doubt the accuracy of this.  Metabolic acidosis: secondary to renal failure. Renal consult requested; will follow recommendations.   Chronic systolic and diastolic congestive heart failure  Worsening proBNP due to worsening kidney function  CHF compensated at this time.  Wt up. Does not appear overloaded i.e. No SOB, no rales. Monitor close I's and O's  Hyperkalemia  given 1 dose of Kayexalate 02/01/12  potassium 4.9 this am.  Will follow electrolytes closely  Essential hypertension, benign  Continue Imdur 30 mg daily. Decrease  Metoprolol to 25 mg twice daily due to episodes  of bradycardia.  Better control. SBP range 118-153   Atrial fibrillation  With episodes of bradycardia overnight (asymptomatic). Will decrease metoprolol to 25mg  po BID  Not on Coumadin  Continue tele   Acute on chronic kidney failure stage IV  Creatinine up from 6.3 yesterday to 7.45 today. BP improved, not on nephrotoxins.  Urine output fair. Renal ultra sound and renal consult pending. Continue to monitor urine output. Check random urine and creatine; BMET in am.   MDS (myelodysplastic syndrome)  Chronic anemia and thrombocytopenia  Oncology is following  Hg 8.5 today. Monitor closely and consider transfusion if Hg <8.0   Physical deconditioning -Will continue PT and when closer to discharge reevaluate and determine if he will benefit of SNF placement for physical rehab or not; patient will think about that possibility; son is in agreement.   Code Status: full Family Communication: son per phone Disposition Plan: to be determined   Consultants:     HPI/Subjective: AAOX3, NAD; afebrile. Denies CP, SOB or abdominal pain  Objective: Filed Vitals:   02/03/12 1754 02/03/12 2129 02/04/12 0522 02/04/12 0600  BP: 123/60 118/72 125/71 153/73  Pulse:  64 66   Temp:   97.7 F (36.5 C)   TempSrc:   Oral   Resp:   16   Height:      Weight:   91.7 kg (202 lb 2.6 oz)   SpO2:   95%     Intake/Output Summary (Last 24 hours) at 02/04/12 1010 Last data filed at 02/04/12 0700  Gross per 24 hour  Intake 1152.5 ml  Output  500 ml  Net  652.5 ml   Filed Weights   02/02/12 0645 02/03/12 0414 02/04/12 0522  Weight: 89.2 kg (196 lb 10.4 oz) 91.5 kg (201 lb 11.5 oz) 91.7 kg (202 lb 2.6 oz)    Exam:   General:  Awake alert oriented x3  Cardiovascular: irregularly irregular No MGR   Respiratory: normal effort BSCTAB No wheeze/rhonchi  Abdomen: soft +BS Non-tender to palpation  Data Reviewed: Basic Metabolic Panel:  Lab 02/04/12 1610 02/03/12 0500 02/02/12 0417  02/01/12 0815 01/31/12 0452  NA 135 136 135 135 137  K 4.9 4.5 4.8 5.5* 4.8  CL 108 109 108 110 114*  CO2 12* 12* 13* 14* 15*  GLUCOSE 112* 85 90 97 94  BUN 48* 44* 41* 37* 32*  CREATININE 7.45* 6.35* 5.08* 3.92* 2.84*  CALCIUM 7.7* 7.4* 7.7* 7.6* 7.6*  MG -- 1.9 -- -- --  PHOS -- -- -- -- --   Liver Function Tests:  Lab 02/01/12 0815  AST 52*  ALT <5  ALKPHOS 80  BILITOT 0.6  PROT 5.1*  ALBUMIN 2.2*   CBC:  Lab 02/04/12 0441 02/03/12 0500 02/02/12 0417 02/01/12 0455 01/31/12 0452 01/30/12 0525  WBC 3.2* 3.4* -- -- 3.3* 2.9*  NEUTROABS -- -- -- -- -- --  HGB 8.5* 8.0* 8.3* 7.8* 8.1*7.7* --  HCT 27.5* 25.5* 26.4* 25.1* 25.6*24.3* --  MCV 94.8 94.4 -- -- 95.7 94.4  PLT 62* 68* -- -- 58* 58*   BNP (last 3 results)  Basename 02/03/12 0500 01/22/12 2200 12/08/11 1600  PROBNP 29791.0* 16466.0* 9654.0*    Recent Results (from the past 240 hour(s))  CLOSTRIDIUM DIFFICILE BY PCR     Status: Normal   Collection Time   02/02/12  9:35 AM      Component Value Range Status Comment   C difficile by pcr NEGATIVE  NEGATIVE Final      Studies: No results found.  Scheduled Meds:   . isosorbide mononitrate  30 mg Oral QPM  . metoprolol  25 mg Oral BID  . [DISCONTINUED] aminocaproic acid  500 mg Oral Q8H  . [DISCONTINUED] metoprolol  50 mg Oral BID   Continuous Infusions:   . sodium chloride 50 mL/hr at 02/03/12 2130  . sodium chloride irrigation 3,000 mL (01/28/12 2101)    Time spent: > 30 minutes    BLACK,KAREN M NP [facility]  If 8PM-8AM, please contact night-coverage at [email or web address], password [facility] 02/04/2012, 10:10 AM  LOS: 14 days

## 2012-02-04 NOTE — Progress Notes (Signed)
PT Cancellation Note  Patient Details Name: Tracy [name] MRN: [id number] DOB: 03-27-1920   Cancelled Treatment:    Reason Eval/Treat Not Completed: Fatigue/lethargy limiting ability to participate (pt politely declined PT, he wants to sleep) Will follow.    Ralene Bathe Kistler 02/04/2012, 11:34 AM [phone]

## 2012-02-04 NOTE — Progress Notes (Addendum)
Occupational Therapy Treatment Patient Details Name: Tracy [name] MRN: [id number] DOB: 01/22/1920 Today's Date: 02/04/2012 Time: 0865-7846 and 950 - 954 OT Time Calculation (min): 27 min  OT Assessment / Plan / Recommendation Comments on Treatment Session Pt became nauseated when sitting EOB.      Follow Up Recommendations  SNF (would benefit but will probably refuse.  HHOT if he refuses)    Barriers to Discharge       Equipment Recommendations       Recommendations for Other Services    Frequency Min 2X/week   Plan      Precautions / Restrictions Precautions Precautions: Fall Restrictions Weight Bearing Restrictions: No   Pertinent Vitals/Pain No c/o pain.  Nauseated.  No c/o dizziness    ADL  Grooming: Performed;Wash/dry face;Brushing hair Where Assessed - Grooming: Unsupported sitting Toilet Transfer: Simulated;Minimal assistance Toilet Transfer Method: Stand pivot Acupuncturist:  (bed to chair and chair to bed--had test. Transfers/Ambulation Related to ADLs: Once pt sat up, he became nauseated.  Did not feel up to standing at sink after this.  Transferred to recliner.   ADL Comments: Pt did not feel up to bathing today, although he reports that he was sweaty last night.  therapist did wash back.  Pt needs extra time for all activities.  Dyspnea 2/4, on 02    OT Diagnosis:    OT Problem List:   OT Treatment Interventions:     OT Goals Acute Rehab OT Goals Time For Goal Achievement: 02/17/12 ADL Goals Pt Will Perform Grooming: with supervision;Standing at sink ADL Goal: Grooming - Progress: Progressing toward goals Pt Will Transfer to Toilet: with supervision;Comfort height toilet ADL Goal: Toilet Transfer - Progress: Progressing toward goals  Visit Information  Last OT Received On: 02/04/12 Assistance Needed: +2 (ambulation)    Subjective Data      Prior Functioning       Cognition  Overall Cognitive Status: Appears within functional limits  for tasks assessed/performed Arousal/Alertness: Awake/alert Orientation Level: Appears intact for tasks assessed Behavior During Session: [facility] for tasks performed    Mobility  Shoulder Instructions Bed Mobility Sitting - Scoot to Edge of Bed: 5: Supervision;With rail Details for Bed Mobility Assistance: extra time Transfers Sit to Stand: 4: Min guard;From bed;With upper extremity assist       Exercises      Balance     End of Session OT - End of Session Activity Tolerance: Patient limited by fatigue Patient left: in chair;with call bell/phone within reach;with chair alarm set  GO     Tracy [name] 02/04/2012, 9:44 AM Marica Otter, OTR/L [phone] 02/04/2012

## 2012-02-04 NOTE — Consult Note (Signed)
Tracy [name] 02/04/2012 Tracy [name] D Requesting Physician:  Dr. Vernie Ammons  Reason for Consult:  Acute on CRF HPI: The patient is a 78 y.o. year-old with hx of CKD stage IV (2.5-3.0), HTN, TIA/CVA, L TKR, CAD w stents in place, afib, diast HF and MDS who was having LUTS and he was admitted 10/24 and underwent TURP on 10/25. Post-op had delayed onset of gross hematuria treated with Amicar, bladder irrigation and on 11/1 he had cystoscopy with clot evacuation. Creat remained stable until 11/5 when Cr increased from 2.8 on 11/4 to 5.08. Creatinine continues to rise, up to 7.45 today. Chart reviewed and no sign of nephrotoxins, specifically no IV contrast, ACEI/ARB, NSAID's or COX II inhibitors. No PPI or regular antibiotics currently. He did have some lowish BP's around 11/1-11/3.  UOP has been low, <500cc/day. Patient has been nauseated with any po intake for a day or two, no confusion or SOB. He is f/b Dr. Caryn Section at [facility] for CKD IV.   ROS  no HA, visual change  no jt pain or itching  no CP, cough or sob  no abd pain or diarrhea  no focal weakness, hallucination or confusion  Past Medical History:  Past Medical History  Diagnosis Date  . CAD (coronary artery disease)     status post prior PCI to the obtuse marginal in 1997 and stenting to the mid to distal RCA in 2001; LHC 9/06:  LM ok, pLAD 50%, mLAD 60% then 60-70%, mRI 50%, mOM1 80-90% (small), pRCA and dRCA stents ok, PDA 50%, EF 65%;  Myoview 5/12: No ischemia, EF 64%.;  NSTEMI 11/12 tx medically   . Chronic kidney disease, stage III (moderate)   . TIA (transient ischemic attack)   . HTN (hypertension)   . Hyperlipidemia   . AF (atrial fibrillation) 02/16/11    Coumadin ==> patient decided to stop after admxn with worsening anemia in setting of UGI bleed, epistaxis  . Arthritis     "in my left ankle"  . Chronic diastolic heart failure   . Carotid stenosis     dopplers 03/2011: 0-39% bilateral  . MDS (myelodysplastic syndrome) 12/16/2011    . Hx of echocardiogram     Echocardiogram 02/17/11: Severe LVH, asymmetric hypertrophy, EF 50-55%, normal wall motion, high ventricular filling pressure, mild LAE, mild RVE, mildly reduced RVSF, mild RAE.  Marland Kitchen Myocardial infarction 2012  . Stroke     5 yrs ago.  Mini - NO RESIDUAL PROBLEMS  . Anemia of chronic disease     RECENT HOSPITALIZATION / BLOOD TRANSFUSION OCT 2013  . Shortness of breath     WITH ANY ACTIVITY---  NO OXYGEN  . CHF (congestive heart failure)     CHRONIC DIASTOLIC HEART FAILURE    Past Surgical History:  Past Surgical History  Procedure Date  . Nose surgery 1986    deviated septum  . Total knee arthroplasty 2009    left  . Joint replacement 2009    left knee  . Surgery scrotal / testicular 197O    CORRECTIVE SURGERY TO VARICOCELE (SWELLING OF SCROTUM)  . Esophagogastroduodenoscopy 12/09/2011    Procedure: ESOPHAGOGASTRODUODENOSCOPY (EGD);  Surgeon: Willis Modena, MD;  Location: [facility] ENDOSCOPY;  Service: Endoscopy;  Laterality: N/A;  outlaw/ebp  . Tonsillectomy and adenoidectomy 1929  . Appendectomy 1930's  . Left ear mastoid surgery 1933  . Removal of one testicle 1944  . Correction to botched varicocele surgery 1970  . Eye surgery 1993    SURGERY FOR DETACHED  RETINA LEFT EYE  . Left cataract extraction with lens implant 1993  . Coronary angioplasty with stent placement     3 procedures; 3 stents total  1994 & 1996  . Right cataract extraction with lens implant   1997  . Transurethral resection of prostate 01/21/2012    Procedure: TRANSURETHRAL RESECTION OF THE PROSTATE WITH GYRUS INSTRUMENTS;  Surgeon: [name], MD;  Location: [facility] ORS;  Service: Urology;  Laterality: N/A;  . Cystoscopy 01/28/2012    Procedure: CYSTOSCOPY;  Surgeon: [name], MD;  Location: [facility] ORS;  Service: Urology;  Laterality: N/A;  cystoscopy   . Hematoma evacuation 01/28/2012    Procedure: EVACUATION HEMATOMA;  Surgeon: [name], MD;  Location: [facility] ORS;  Service:  Urology;  Laterality: N/A;   with fulgeration evacuation of clot    Family History: History reviewed. No pertinent family history. Social History:  reports that he quit smoking about 33 years ago. His smoking use included Cigarettes and Pipe. He has a 80 pack-year smoking history. He has never used smokeless tobacco. He reports that he drinks alcohol. He reports that he does not use illicit drugs.  Allergies:  Allergies  Allergen Reactions  . Oxycodone Hcl Other (See Comments)    Wanted to climb the wall  . Prednisone Other (See Comments)    Gain weight     Home medications: Prior to Admission medications   Medication Sig Start Date End Date Taking? Authorizing Provider  acetaminophen (TYLENOL) 500 MG tablet Take 500 mg by mouth every 6 (six) hours as needed. For pain   Yes Historical Provider, MD  metoprolol (LOPRESSOR) 50 MG tablet Take 1 tablet (50 mg total) by mouth 2 (two) times daily. 01/11/12 01/10/13 Yes Beatrice Lecher, PA  nitrofurantoin (MACRODANTIN) 100 MG capsule Take 100 mg by mouth 2 (two) times daily after a meal. X 7 DAYS    STARTED Monday 01/17/12   TAKES WITH FOOD OR MILK   Yes Historical Provider, MD  nitroGLYCERIN (NITROSTAT) 0.4 MG SL tablet Place 0.4 mg under the tongue every 5 (five) minutes as needed. For chest pain   Yes Historical Provider, MD  Darbepoetin Alfa-Polysorbate (ARANESP, ALBUMIN FREE,) 150 MCG/0.75ML SOLN Inject 150 mg as directed every 7 (seven) days. Friday.    Historical Provider, MD  isosorbide mononitrate (IMDUR) 30 MG 24 hr tablet Take 30 mg by mouth every evening.    Historical Provider, MD  traMADol (ULTRAM) 50 MG tablet Take 1 tablet (50 mg total) by mouth every 6 (six) hours as needed for pain. 01/21/12   [name], MD    Inpatient medications:    . isosorbide mononitrate  30 mg Oral QPM  . metoprolol  25 mg Oral BID  . [DISCONTINUED] aminocaproic acid  500 mg Oral Q8H  . [DISCONTINUED] metoprolol  50 mg Oral BID     Labs: Basic Metabolic Panel:  Lab 02/04/12 4098 02/03/12 0500 02/02/12 0417 02/01/12 0815 01/31/12 0452 01/30/12 0525  NA 135 136 135 135 137 135  K 4.9 4.5 4.8 5.5* 4.8 4.8  CL 108 109 108 110 114* 111  CO2 12* 12* 13* 14* 15* 15*  GLUCOSE 112* 85 90 97 94 94  BUN 48* 44* 41* 37* 32* 33*  CREATININE 7.45* 6.35* 5.08* 3.92* 2.84* 2.52*  ALB -- -- -- -- -- --  CALCIUM 7.7* 7.4* 7.7* 7.6* 7.6* 7.6*  PHOS -- -- -- -- -- --   Liver Function Tests:  Lab 02/01/12  0815  AST 52*  ALT <5  ALKPHOS 80  BILITOT 0.6  PROT 5.1*  ALBUMIN 2.2*   No results found for this basename: LIPASE:3,AMYLASE:3 in the last 168 hours No results found for this basename: AMMONIA:3 in the last 168 hours CBC:  Lab 02/04/12 0441 02/03/12 0500 02/02/12 0417 02/01/12 0455 01/31/12 0452 01/30/12 0525  WBC 3.2* 3.4* -- -- 3.3* 2.9*  NEUTROABS -- -- -- -- -- --  HGB 8.5* 8.0* 8.3* 7.8* -- --  HCT 27.5* 25.5* 26.4* 25.1* -- --  MCV 94.8 94.4 -- -- 95.7 94.4  PLT 62* 68* -- -- 58* 58*   PT/INR: @labrcntip (inr:5) Cardiac Enzymes: No results found for this basename: CKTOTAL:5,CKMB:5,CKMBINDEX:5,TROPONINI:5 in the last 168 hours CBG: No results found for this basename: GLUCAP:5 in the last 168 hours  Iron Studies: No results found for this basename: IRON:30,TIBC:30,TRANSFERRIN:30,FERRITIN:30 in the last 168 hours  Xrays/Other Studies: US Renal  02/04/2012  *RADIOLOGY REPORT*  Clinical Data: Elevated creatinine  RENAL/URINARY TRACT ULTRASOUND COMPLETE  Comparison:  12/06/2011 MRI  Findings:  Right Kidney:  Measures 11.3 cm.  The echogenicity of the cortex is increased.  There is a simple appearing cyst exophytic from the right kidney posteriorly measuring 2.5 cm.  Left Kidney:  Measures 10.2 cm.  Increased parenchymal echogenicity.  There is a 1.7 cm cyst arising from the upper pole laterally and a 3.4 cm cyst arising from the lower pole anteriorly. While incompletely characterized by ultrasound, these  appear simple on the prior MRI.  Bladder:  There is a Foley catheter within a nearly decompressed bladder.  Ascites and bilateral pleural effusions noted.  IMPRESSION: No hydronephrosis.  Increased renal echogenicity, can be seen with underlying medical renal disease.  Ascites and bilateral pleural effusions.   Original Report Authenticated By: Jearld Lesch, M.D.     Physical Exam:  Blood pressure 149/76, pulse 77, temperature 97.5 F (36.4 C), temperature source Oral, resp. rate 18, height 6\' 1"  (1.854 m), weight 91.7 kg (202 lb 2.6 oz), SpO2 96.00%.  Gen: elderly pleasant WM in no distress, alert. Having dry heaves after drinking water. WD, WN Skin: no rash, cyanosis HEENT:  EOMI, sclera anicteric, throat clear Neck: mild JVD, no bruits or LAN Chest: mild crackles and dec'd BS L base, R clear Heart: regular, no rub or gallop, no murmur, quiet precordium Abdomen: soft, mod obese, no HSM or masses, notender Ext: 2-3+ pitting edema bilat ext's up to the hips Neuro: alert, Ox3, no focal deficit, no asterixis   Impression/Plan 1. Acute on CRF- patient has underlying CKD stage IV with baseline eGFR of only 26mL/min. Do not see any of the usual culprits. The only medication he has been getting steadily is amino-caproic acid. There are many reports of myopathy, even myonecrosis and rhabdomyolysis in patients on prolonged courses of Amicar. Will check CPK, do not see another reason for acute loss of renal function. He needs dialysis, not immediately, but in the next 24 hrs, due to advancing uremic symptoms and vol overload. Needs transfer [facility] and then we will place temporary access and start HD probably tomorrow. Need for transfer d/w Dr. Vernie Ammons. Will also get CXR, check CPK and stop IVF's, give a couple doses of high dose IV lasix.  2. Hx CADw 3 stents 3. S/P TURP 4. Gross hematuria 5. Hx afib, CVA, HTN, myelodysplastic syndrome  Plan discussed with Dr. Vernie Ammons. Will follow.    [name]  MD [employer] [phone] pgr  [phone] cell 02/04/2012, 3:48 PM

## 2012-02-05 ENCOUNTER — Inpatient Hospital Stay (HOSPITAL_COMMUNITY): Payer: Medicare Other

## 2012-02-05 DIAGNOSIS — N19 Unspecified kidney failure: Secondary | ICD-10-CM

## 2012-02-05 DIAGNOSIS — N186 End stage renal disease: Secondary | ICD-10-CM

## 2012-02-05 LAB — CBC
Hemoglobin: 8.4 g/dL — ABNORMAL LOW (ref 13.0–17.0)
MCH: 28.8 pg (ref 26.0–34.0)
Platelets: 58 10*3/uL — ABNORMAL LOW (ref 150–400)
RBC: 2.92 MIL/uL — ABNORMAL LOW (ref 4.22–5.81)
WBC: 4 10*3/uL (ref 4.0–10.5)

## 2012-02-05 LAB — HEPATITIS B CORE ANTIBODY, TOTAL: Hep B Core Total Ab: NEGATIVE

## 2012-02-05 LAB — MRSA PCR SCREENING: MRSA by PCR: NEGATIVE

## 2012-02-05 LAB — RENAL FUNCTION PANEL
CO2: 9 mEq/L — CL (ref 19–32)
Chloride: 108 mEq/L (ref 96–112)
GFR calc Af Amer: 6 mL/min — ABNORMAL LOW (ref >90)
GFR calc non Af Amer: 5 mL/min — ABNORMAL LOW (ref >90)
Glucose, Bld: 81 mg/dL (ref 70–99)
Potassium: 5.5 mEq/L — ABNORMAL HIGH (ref 3.5–5.1)
Sodium: 136 mEq/L (ref 135–145)

## 2012-02-05 LAB — HEPATITIS B SURFACE ANTIBODY,QUALITATIVE: Hep B S Ab: POSITIVE — AB

## 2012-02-05 NOTE — Procedures (Signed)
Under sterile conditions a R IJ 15 cm temporary Trialysis hemodialysis catheter was placed with US guidance. No difficulty or immediate complications. CXR shows tip in SVC, no PTX. Ready for use. Pt tolerated procedure well.   [name]  MD [facility] [phone] pgr    [phone] cell 02/05/2012, 9:04 AM

## 2012-02-05 NOTE — Consult Note (Signed)
Vascular and Vein Specialist of [facility]  Patient name: Tracy [name] MRN: [id number] DOB: [birth year] Sex: male  REASON FOR CONSULT: Needs hemodialysis access.  HPI:  Tracy [name] is a 78 y.o. male admitted on 01/20/12 for a urologic procedure. He has acute on chronic kidney disease and is now on HD via a temporary right IJ catheter. Vascular Surgery was consulted to arrange for placement of a Diatek catheter and permanent access. He is currently on dialysis and he is not very communicative. He is not sure Y. He has renal insufficiency. He is on dialysis schedule now given his uremic symptoms and volume excess. He has pleural effusions more significantly on the left side and also bilateral lower extremity edema. He is right-handed.  He had been on Coumadin for Afib. His Coumadin has been stopped as he was having problems with significant anemia.  Past Medical History  Diagnosis Date  . CAD (coronary artery disease)     status post prior PCI to the obtuse marginal in 1997 and stenting to the mid to distal RCA in 2001; LHC 9/06:  LM ok, pLAD 50%, mLAD 60% then 60-70%, mRI 50%, mOM1 80-90% (small), pRCA and dRCA stents ok, PDA 50%, EF 65%;  Myoview 5/12: No ischemia, EF 64%.;  NSTEMI 11/12 tx medically   . Chronic kidney disease, stage III (moderate)   . TIA (transient ischemic attack)   . HTN (hypertension)   . Hyperlipidemia   . AF (atrial fibrillation) 02/16/11    Coumadin ==> patient decided to stop after admxn with worsening anemia in setting of UGI bleed, epistaxis  . Arthritis     "in my left ankle"  . Chronic diastolic heart failure   . Carotid stenosis     dopplers 03/2011: 0-39% bilateral  . MDS (myelodysplastic syndrome) 12/16/2011  . Hx of echocardiogram     Echocardiogram 02/17/11: Severe LVH, asymmetric hypertrophy, EF 50-55%, normal wall motion, high ventricular filling pressure, mild LAE, mild RVE, mildly reduced RVSF, mild RAE.  Marland Kitchen Myocardial infarction 2012  . Stroke     5  yrs ago.  Mini - NO RESIDUAL PROBLEMS  . Anemia of chronic disease     RECENT HOSPITALIZATION / BLOOD TRANSFUSION OCT 2013  . Shortness of breath     WITH ANY ACTIVITY---  NO OXYGEN  . CHF (congestive heart failure)     CHRONIC DIASTOLIC HEART FAILURE    History reviewed. No pertinent family history.  SOCIAL HISTORY: History  Substance Use Topics  . Smoking status: Former Smoker -- 2.0 packs/day for 40 years    Types: Cigarettes, Pipe    Quit date: 08/24/1978  . Smokeless tobacco: Never Used     Comment: 1980 quit smoking cigarrettes and pipe  . Alcohol Use: Yes     Comment: "glass of wine or beer once in awhile"    Allergies  Allergen Reactions  . Oxycodone Hcl Other (See Comments)    Wanted to climb the wall  . Prednisone Other (See Comments)    Gain weight     Current Facility-Administered Medications  Medication Dose Route Frequency Provider Last Rate Last Dose  . 0.9 %  sodium chloride infusion  100 mL Intravenous PRN Maree Krabbe, MD      . 0.9 %  sodium chloride infusion  100 mL Intravenous PRN Maree Krabbe, MD      . acetaminophen (TYLENOL) tablet 650 mg  650 mg Oral Q4H PRN [name], MD   [phone]  mg at 01/22/12 0734  . [EXPIRED] alteplase (CATHFLO ACTIVASE) injection 2 mg  2 mg Intracatheter Once PRN Maree Krabbe, MD      . feeding supplement (NEPRO CARB STEADY) liquid 237 mL  237 mL Oral PRN Maree Krabbe, MD      . [address] furosemide (LASIX) 100 mg in dextrose 5 % 50 mL IVPB  100 mg Intravenous Once Maree Krabbe, MD   100 mg at 02/04/12 1706  . HYDROcodone-acetaminophen (NORCO/VICODIN) 5-325 MG per tablet 1-2 tablet  1-2 tablet Oral Q4H PRN [name], MD   2 tablet at 02/03/12 2129  . lidocaine (XYLOCAINE) 1 % injection 5 mL  5 mL Intradermal PRN Maree Krabbe, MD      . lidocaine-prilocaine (EMLA) cream 1 application  1 application Topical PRN Maree Krabbe, MD      . neomycin-bacitracin-polymyxin (NEOSPORIN) ointment    Topical TID PRN [name], MD      . ondansetron [facility]) injection 4 mg  4 mg Intravenous Q4H PRN [name], MD   4 mg at 02/05/12 0616  . pentafluoroprop-tetrafluoroeth (GEBAUERS) aerosol 1 application  1 application Topical PRN Maree Krabbe, MD      . sodium chloride irrigation 0.9 % 3,000 mL  3,000 mL Irrigation Continuous [name], MD   3,000 mL at 01/28/12 2101  . [DISCONTINUED] 0.9 %  sodium chloride infusion   Intravenous Continuous Vassie Loll, MD 50 mL/hr at 02/03/12 2130    . [DISCONTINUED] heparin injection 1,000 Units  1,000 Units Dialysis PRN Maree Krabbe, MD      . [DISCONTINUED] isosorbide mononitrate (IMDUR) 24 hr tablet 30 mg  30 mg Oral QPM Maryruth Bun Rama, MD   30 mg at 02/04/12 1706  . [DISCONTINUED] metoprolol tartrate (LOPRESSOR) tablet 25 mg  25 mg Oral BID Gwenyth Bender, NP   25 mg at 02/04/12 2312  . [DISCONTINUED] neomycin-bacitracin-polymyxin (NEOSPORIN) ointment 1 application  1 application Topical TID PRN [name], MD        REVIEW OF SYSTEMS: Arly.Keller ] denotes positive finding; [  ] denotes negative finding CARDIOVASCULAR:  [ ]  chest pain   [ ]  chest pressure   [ ]  palpitations   Arly.Keller ] orthopnea   Arly.Keller ] dyspnea on exertion   [ ]  claudication   [ ]  rest pain   [ ]  DVT   [ ]  phlebitis PULMONARY:   [ ]  productive cough   [ ]  asthma   [ ]  wheezing NEUROLOGIC:   [ ]  weakness  [ ]  paresthesias  [ ]  aphasia  [ ]  amaurosis  [ ]  dizziness HEMATOLOGIC:   Arly.Keller ] bleeding problems: nose bleeds  [ ]  clotting disorders MUSCULOSKELETAL:  Arly.Keller ] joint pain   [ ]  joint swelling [ ]  leg swelling GASTROINTESTINAL: [ ]   blood in stool  [ ]   hematemesis GENITOURINARY:  [ ]   dysuria  [ ]   hematuria PSYCHIATRIC:  [ ]  history of major depression INTEGUMENTARY:  [ ]  rashes  [ ]  ulcers CONSTITUTIONAL:  [ ]  fever   [ ]  chills  PHYSICAL EXAM: Filed Vitals:   02/04/12 2206 02/05/12 0542 02/05/12 0842 02/05/12 0852  BP: 146/80 117/51 143/72 159/94  Pulse: 78 78 82  89  Temp: 97.4 F (36.3 C) 98 F (36.7 C)    TempSrc: Oral Oral    Resp: 17 16 16 18   Height:      Weight:  205 lb 11 oz (93.3 kg)     SpO2: 92% 90% 94%    Body mass index is 27.14 kg/(m^2). GENERAL: The patient is a well-nourished male, in no acute distress. The vital signs are documented above. CARDIOVASCULAR: There is a regular rate and rhythm. He has palpable brachial and radial pulses bilaterally. PULMONARY: There is good air exchange bilaterally. He has expiratory wheezes and decreased breath sounds at the bases. ABDOMEN: Soft and non-tender with normal pitched bowel sounds.  MUSCULOSKELETAL: There are no major deformities or cyanosis. NEUROLOGIC: No focal weakness or paresthesias are detected. SKIN: There are no ulcers or rashes noted. PSYCHIATRIC: The patient has a normal affect.  DATA:  Lab Results  Component Value Date   WBC 3.2* 02/04/2012   HGB 8.5* 02/04/2012   HCT 27.5* 02/04/2012   MCV 94.8 02/04/2012   PLT 62* 02/04/2012   Lab Results  Component Value Date   NA 135 02/04/2012   K 4.9 02/04/2012   CL 108 02/04/2012   CO2 12* 02/04/2012   Lab Results  Component Value Date   CREATININE 7.45* 02/04/2012   Lab Results  Component Value Date   INR 1.41 01/22/2012   INR 1.27 01/19/2012   INR 1.35 12/28/2011   CXR: left pleural effusion  MEDICAL ISSUES: This patient needs long-term access. He is right-handed so I would plan on placement of an AV fistula or AV graft in the left arm. I have order bilateral upper extremity vein mapping. Will plan on Diatek and Left AVF/AVG on Tuesday, pending the results of his vein mapping.  DICKSON,CHRISTOPHER S [facility] [place] [facility]: [phone]

## 2012-02-05 NOTE — Progress Notes (Signed)
Subjective: Transferred to [facility], severe nausea today with dry heaves, no SOB or cough, no CP, no confusion  Objective Vital signs in last 24 hours: Filed Vitals:   02/04/12 1518 02/04/12 2044 02/04/12 2206 02/05/12 0542  BP: 149/76 128/78 146/80 117/51  Pulse: 77 77 78 78  Temp: 97.5 F (36.4 C) 98.3 F (36.8 C) 97.4 F (36.3 C) 98 F (36.7 C)  TempSrc: Oral Oral Oral Oral  Resp: 18 18 17 16   Height:      Weight:   93.3 kg (205 lb 11 oz)   SpO2: 96% 98% 92% 90%   Weight change: 1.6 kg (3 lb 8.4 oz)  Intake/Output Summary (Last 24 hours) at 02/05/12 0839 Last data filed at 02/05/12 0640  Gross per 24 hour  Intake    120 ml  Output     45 ml  Net     75 ml   Labs: Basic Metabolic Panel:  Lab 02/04/12 1478 02/03/12 0500 02/02/12 0417 02/01/12 0815 01/31/12 0452 01/30/12 0525  NA 135 136 135 135 137 135  K 4.9 4.5 4.8 5.5* 4.8 4.8  CL 108 109 108 110 114* 111  CO2 12* 12* 13* 14* 15* 15*  GLUCOSE 112* 85 90 97 94 94  BUN 48* 44* 41* 37* 32* 33*  CREATININE 7.45* 6.35* 5.08* 3.92* 2.84* 2.52*  ALB -- -- -- -- -- --  CALCIUM 7.7* 7.4* 7.7* 7.6* 7.6* 7.6*  PHOS -- -- -- -- -- --   Liver Function Tests:  Lab 02/01/12 0815  AST 52*  ALT <5  ALKPHOS 80  BILITOT 0.6  PROT 5.1*  ALBUMIN 2.2*   No results found for this basename: LIPASE:3,AMYLASE:3 in the last 168 hours No results found for this basename: AMMONIA:3 in the last 168 hours CBC:  Lab 02/04/12 0441 02/03/12 0500 02/02/12 0417 02/01/12 0455 01/31/12 0452 01/30/12 0525  WBC 3.2* 3.4* -- -- 3.3* 2.9*  NEUTROABS -- -- -- -- -- --  HGB 8.5* 8.0* 8.3* 7.8* -- --  HCT 27.5* 25.5* 26.4* 25.1* -- --  MCV 94.8 94.4 -- -- 95.7 94.4  PLT 62* 68* -- -- 58* 58*   PT/INR: @labrcntip (inr:5) Cardiac Enzymes:  Lab 02/04/12 1633  CKTOTAL 64  CKMB --  CKMBINDEX --  TROPONINI --   CBG: No results found for this basename: GLUCAP:5 in the last 168 hours  Iron Studies: No results found for this basename:  IRON:30,TIBC:30,TRANSFERRIN:30,FERRITIN:30 in the last 168 hours  Physical Exam:  Blood pressure 117/51, pulse 78, temperature 98 F (36.7 C), temperature source Oral, resp. rate 16, height 6\' 1"  (1.854 m), weight 93.3 kg (205 lb 11 oz), SpO2 90.00%.  Gen: elderly pleasant WM in no distress, alert. WD, WN  Skin: no rash, cyanosis  HEENT: EOMI, sclera anicteric, throat clear  Neck: mild JVD, no bruits or LAN  Chest: mild crackles and dec'd BS L base, R clear  Heart: regular, no rub or gallop, no murmur, quiet precordium  Abdomen: soft, mod obese, no HSM or masses, notender  Ext: 2-3+ pitting edema bilat ext's up to the hips  Neuro: alert, Ox3, no focal deficit, no asterixis   ECHO 9/13 LVH, EF 50%  Impression/Plan  1. AKI on CKD IV- likely hemodynamic, no toxins or sig hypotension. CPK was normal. Starting HD today, significant uremic symptoms and vol excess. Suspect this will be ESRD, baseline renal function was only 20 mL/min. Will do dialysis with temp cath for 3 days, and consult VVS  for [facility] and perm access next week. Start CLIP process on Monday. 2. Volume overload w pleural effusion L > R and leg edema- UF with HD 3. Hx CADw 3 stents 4. S/P TURP on 5. Gross hematuria/thrombocytopenia- no heparin with HD 6. Hx afib 7. Hx CVA,  8. HTN- stop metoprolol and imdur for low BP's for now 9. Myelodysplastic syndrome    [name]  MD [facility] [phone] pgr    [phone] cell 02/05/2012, 8:39 AM

## 2012-02-05 NOTE — Progress Notes (Signed)
Received patient from [facility] via transport.Patient is alert and oriented x4.Foley catheter intact.Placed on telemetry.Oriented to room and unit routines.Advised to call for assistance.Call bell within reach.Will continue to monitor. Hieu Herms Joselita,RN

## 2012-02-05 NOTE — Progress Notes (Signed)
Urology Progress Note  8 Days Post-Op   Subjective: Post op TURP with post op bleeding and repeat prostatic fossa cauterization. Pt has acute on chronic renal failure, and has been dialyzed this AM. Urine ( small quantity-none in dialysis unit- bloody, but no clot.     No acute urologic events overnight. Ambulation:   negative Flatus:    negative Bowel movement  negative  Pain: complete resolution  Objective:  Blood pressure 157/89, pulse 90, temperature 97.8 F (36.6 C), temperature source Oral, resp. rate 14, height 6\' 1"  (1.854 m), weight 89 kg (196 lb 3.4 oz), SpO2 93.00%.  Physical Exam:  General:  No acute distress, awake Resp: clear to auscultation bilaterally Genitourinary:  Normal external GU exam.  Foley: in place. Small amount urine ( 100cc), bloody, no clot.     I/O last 3 completed shifts: In: 720 [P.O.:120; I.V.:600] Out: 445 [Urine:445]  Recent Labs  [facility] 02/05/12 0855 02/04/12 0441   HGB 8.4* 8.5*   WBC 4.0 3.2*   PLT 58* 62*    Recent Labs  Basename 02/05/12 0855 02/04/12 0441   NA 136 135   K 5.5* 4.9   CL 108 108   CO2 9* 12*   BUN 54* 48*   CREATININE 8.60* 7.45*   CALCIUM 7.8* 7.7*   GFRNONAA 5* 6*   GFRAA 6* 7*     No results found for this basename: PT:2,INR:2,APTT:2 in the last 72 hours   No components found with this basename: ABG:2  Assessment/Plan:  Catheter not removed. Continue any current medications.

## 2012-02-05 NOTE — Procedures (Signed)
I was present at this dialysis session. I have reviewed the session itself and made appropriate changes.   [name], MD [employer] 02/05/2012, 9:02 AM

## 2012-02-06 ENCOUNTER — Inpatient Hospital Stay (HOSPITAL_COMMUNITY): Payer: Medicare Other

## 2012-02-06 LAB — RENAL FUNCTION PANEL
CO2: 16 mEq/L — ABNORMAL LOW (ref 19–32)
Calcium: 7.7 mg/dL — ABNORMAL LOW (ref 8.4–10.5)
Creatinine, Ser: 7.43 mg/dL — ABNORMAL HIGH (ref 0.50–1.35)
Glucose, Bld: 85 mg/dL (ref 70–99)
Phosphorus: 5.6 mg/dL — ABNORMAL HIGH (ref 2.3–4.6)

## 2012-02-06 LAB — CBC
Hemoglobin: 8.7 g/dL — ABNORMAL LOW (ref 13.0–17.0)
MCH: 29.7 pg (ref 26.0–34.0)
MCHC: 31.9 g/dL (ref 30.0–36.0)
MCV: 93.2 fL (ref 78.0–100.0)
Platelets: 64 10*3/uL — ABNORMAL LOW (ref 150–400)
RBC: 2.93 MIL/uL — ABNORMAL LOW (ref 4.22–5.81)

## 2012-02-06 MED ORDER — METOPROLOL TARTRATE 50 MG PO TABS
50.0000 mg | ORAL_TABLET | Freq: Two times a day (BID) | ORAL | Status: DC
  Administered 2012-02-06 – 2012-02-11 (×10): 50 mg via ORAL
  Filled 2012-02-06 (×12): qty 1

## 2012-02-06 MED ORDER — DARBEPOETIN ALFA-POLYSORBATE 60 MCG/0.3ML IJ SOLN
60.0000 ug | INTRAMUSCULAR | Status: DC
  Administered 2012-02-07: 60 ug via INTRAVENOUS
  Filled 2012-02-06: qty 0.3

## 2012-02-06 NOTE — Progress Notes (Signed)
Urology Progress Note  9 Days Post-Op   Subjective:Subjective: Post op TURP with post op bleeding and repeat prostatic fossa cauterization. Pt has acute on chronic renal failure, and has been dialyzed this AM. Urine ( small quantity-none in dialysis unit- bloody, but no clot.  No acute urologic events overnight.      No acute urologic events overnight. Ambulation:   negative Flatus:    positive Bowel movement  negative  Pain: complete resolution  Objective:  Blood pressure 159/82, pulse 89, temperature 98.2 F (36.8 C), temperature source Oral, resp. rate 18, height 6\' 1"  (1.854 m), weight 85 kg (187 lb 6.3 oz), SpO2 93.00%.  Physical Exam:  General:  No acute distress, awake Extremities: extremities normal, atraumatic, no cyanosis or edema Genitourinary:  No edema. Foley in place.  Foley: gross heme continues.  Minimal urine production    I/O last 3 completed shifts: In: 120 [P.O.:120] Out: 3000 [Other:3000]  Recent Labs  [facility] 02/06/12 0815 02/05/12 0855   HGB 8.7* 8.4*   WBC 5.5 4.0   PLT 64* 58*    Recent Labs  Basename 02/06/12 0815 02/05/12 0855   NA 139 136   K 4.5 5.5*   CL 105 108   CO2 16* 9*   BUN 40* 54*   CREATININE 7.43* 8.60*   CALCIUM 7.7* 7.8*   GFRNONAA 6* 5*   GFRAA 7* 6*     No results found for this basename: PT:2,INR:2,APTT:2 in the last 72 hours   No components found with this basename: ABG:2  Assessment/Plan:  Continue catheter Continue any current medications.

## 2012-02-06 NOTE — Progress Notes (Signed)
Vascular Surgery  Vein map pending. Tentatively scheduled for Diatek and L AVF Tuesday.  Cari Caraway Beeper [phone] 02/06/2012

## 2012-02-06 NOTE — Progress Notes (Signed)
Subjective: HD yesterday, no difficutlies, 3kg off, stable BP. Still having dry heaves today  Objective Vital signs in last 24 hours: Filed Vitals:   02/06/12 0900 02/06/12 0930 02/06/12 1000 02/06/12 1100  BP: 148/84 145/82 160/91 147/87  Pulse: 87 90 90 82  Temp:      TempSrc:      Resp: 19 16 21 17   Height:      Weight:      SpO2:       Weight change: -0.9 kg (-1 lb 15.8 oz)  Intake/Output Summary (Last 24 hours) at 02/06/12 1127 Last data filed at 02/05/12 1128  Gross per 24 hour  Intake      0 ml  Output   3000 ml  Net  -3000 ml   Labs: Basic Metabolic Panel:  Lab 02/06/12 9147 02/05/12 0855 02/04/12 0441 02/03/12 0500 02/02/12 0417 02/01/12 0815 01/31/12 0452  NA 139 136 135 136 135 135 137  K 4.5 5.5* 4.9 4.5 4.8 5.5* 4.8  CL 105 108 108 109 108 110 114*  CO2 16* 9* 12* 12* 13* 14* 15*  GLUCOSE 85 81 112* 85 90 97 94  BUN 40* 54* 48* 44* 41* 37* 32*  CREATININE 7.43* 8.60* 7.45* 6.35* 5.08* 3.92* 2.84*  ALB -- -- -- -- -- -- --  CALCIUM 7.7* 7.8* 7.7* 7.4* 7.7* 7.6* 7.6*  PHOS 5.6* 6.6* -- -- -- -- --   Liver Function Tests:  Lab 02/06/12 0815 02/05/12 0855 02/01/12 0815  AST -- -- 52*  ALT -- -- <5  ALKPHOS -- -- 80  BILITOT -- -- 0.6  PROT -- -- 5.1*  ALBUMIN 2.6* 2.6* 2.2*   No results found for this basename: LIPASE:3,AMYLASE:3 in the last 168 hours No results found for this basename: AMMONIA:3 in the last 168 hours CBC:  Lab 02/06/12 0815 02/05/12 0855 02/04/12 0441 02/03/12 0500  WBC 5.5 4.0 3.2* 3.4*  NEUTROABS -- -- -- --  HGB 8.7* 8.4* 8.5* 8.0*  HCT 27.3* 27.6* 27.5* 25.5*  MCV 93.2 94.5 94.8 94.4  PLT 64* 58* 62* 68*   PT/INR: @labrcntip (inr:5) Cardiac Enzymes:  Lab 02/04/12 1633  CKTOTAL 64  CKMB --  CKMBINDEX --  TROPONINI --   CBG: No results found for this basename: GLUCAP:5 in the last 168 hours  Iron Studies: No results found for this basename: IRON:30,TIBC:30,TRANSFERRIN:30,FERRITIN:30 in the last 168 hours  Physical  Exam:  Blood pressure 147/87, pulse 82, temperature 98.1 F (36.7 C), temperature source Oral, resp. rate 17, height 6\' 1"  (1.854 m), weight 88.4 kg (194 lb 14.2 oz), SpO2 94.00%.  Gen: elderly pleasant WM in no distress, alert. WD, WN  Skin: no rash, cyanosis  HEENT: EOMI, sclera anicteric, throat clear  Neck: mild JVD, no bruits or LAN  Chest: mild crackles and dec'd BS L base, R clear  Heart: regular, no rub or gallop, no murmur, quiet precordium  Abdomen: soft, mod obese, no HSM or masses, notender  Ext: 2-3+ pitting edema throughout both legs bilat Neuro: alert, Ox3, no focal deficit, no asterixis   ECHO 9/13 LVH, EF 50%  Impression/Plan  1. AKI on CKD IV- likely hemodynamic, no toxins or sig hypotension.  Baseline GFR only 20 mL/min, suspect ESRD. Have consulted VVS for [facility] and perm access next week, prob Tuesday. Start CLIP process on Monday. Still uremic, will continue daily HD, increase time and UF. HD tomorrow.  2. Volume overload w pleural effusion L > R and anasarca- cont UF  with HD 3. HTN- BP up, will restart metoprolol (50 bid) 4. S/P TURP 10/25 5. Gross hematuria/thrombocytopenia- no heparin with HD for now 6. Anemia due to ABL and CKD- Hb 8's, start EPO and check iron status 7. Hx afib and CAD w stents in place 8. Hx CVA 9. Myelodysplastic syndrome    Tracy Moselle  MD [facility] [phone] pgr    [phone] cell 02/06/2012, 11:27 AM

## 2012-02-06 NOTE — Procedures (Signed)
I was present at this dialysis session. I have reviewed the session itself and made appropriate changes.   [name], MD [employer] 02/06/2012, 11:37 AM

## 2012-02-06 NOTE — Progress Notes (Signed)
Right  Upper Extremity Vein Map    Cephalic  Segment Diameter Depth Comment  1. Axilla 3.93mm mm   2. Mid upper arm 4.77mm mm branch  3. Above AC 2.31mm mm   4. In AC 3.73mm mm   5. Below AC 2.12mm mm   6. Mid forearm 2.70mm mm   7. Wrist 3.19mm mm    mm mm    mm mm    mm mm    Basilic  Segment Diameter Depth Comment  1. Axilla mm mm   2. Mid upper arm mm mm   3. Above AC 2.54mm 9.107mm branch  4. In [facility] 6.71mm mm   5. Below AC 2.5mm 3.43mm   6. Mid forearm 1.73mm 2.51mm   7. Wrist 2.23mm 1.110mm    mm mm    mm mm    mm mm   Left Upper Extremity Vein Map    Cephalic  Segment Diameter Depth Comment  1. Axilla 3.73mm mm   2. Mid upper arm 3.80mm mm   3. Above AC 3.55mm mm   4. In AC 4.11mm mm   5. Below AC 6.21mm mm branch  6. Mid forearm 3.76mm mm   7. Wrist 3.57mm mm    mm mm    mm mm    mm mm    Basilic  Segment Diameter Depth Comment  1. Axilla 5.19mm 11mm   2. Mid upper arm 5.21mm 10mm   3. Above AC 6.100mm 7.68mm   4. In [facility] 5.36mm 6.49mm   5. Below AC 2.60mm 3.69mm   6. Mid forearm 1.58mm 3mm   7. Wrist 0.68mm 1.25mm    mm mm    mm mm    mm mm

## 2012-02-07 ENCOUNTER — Inpatient Hospital Stay (HOSPITAL_COMMUNITY): Payer: Medicare Other

## 2012-02-07 LAB — RENAL FUNCTION PANEL
BUN: 27 mg/dL — ABNORMAL HIGH (ref 6–23)
Calcium: 7.5 mg/dL — ABNORMAL LOW (ref 8.4–10.5)
Creatinine, Ser: 6.01 mg/dL — ABNORMAL HIGH (ref 0.50–1.35)
Glucose, Bld: 105 mg/dL — ABNORMAL HIGH (ref 70–99)
Phosphorus: 4.4 mg/dL (ref 2.3–4.6)

## 2012-02-07 LAB — CBC
HCT: 27.3 % — ABNORMAL LOW (ref 39.0–52.0)
Hemoglobin: 8.7 g/dL — ABNORMAL LOW (ref 13.0–17.0)
MCH: 29.2 pg (ref 26.0–34.0)
MCHC: 31.9 g/dL (ref 30.0–36.0)
MCV: 91.6 fL (ref 78.0–100.0)
RDW: 18.2 % — ABNORMAL HIGH (ref 11.5–15.5)

## 2012-02-07 LAB — IRON AND TIBC
Saturation Ratios: 37 % (ref 20–55)
UIBC: 127 ug/dL (ref 125–400)

## 2012-02-07 MED ORDER — DARBEPOETIN ALFA-POLYSORBATE 60 MCG/0.3ML IJ SOLN
INTRAMUSCULAR | Status: AC
  Administered 2012-02-07: 60 ug via INTRAVENOUS
  Filled 2012-02-07: qty 0.3

## 2012-02-07 MED ORDER — DEXTROSE 5 % IV SOLN
1.5000 g | INTRAVENOUS | Status: AC
  Administered 2012-02-08: 1.5 g via INTRAVENOUS
  Filled 2012-02-07: qty 1.5

## 2012-02-07 NOTE — Progress Notes (Signed)
10 Days Post-Op Subjective: Patient reports that he is any nausea at this. He reports no pain. He also has no shortness of breath.  Objective: Vital signs in last 24 hours: Temp:  [97.4 F (36.3 C)-98.3 F (36.8 C)] 98.2 F (36.8 C) (11/11 0423) Pulse Rate:  [76-93] 89  (11/11 0700) Resp:  [13-21] 16  (11/11 0700) BP: (136-167)/(55-107) 157/89 mmHg (11/11 0700) SpO2:  [90 %-95 %] 93 % (11/11 0423) FiO2 (%):  [2 %] 2 % (11/10 1125) Weight:  [85 kg (187 lb 6.3 oz)-88.4 kg (194 lb 14.2 oz)] 85.4 kg (188 lb 4.4 oz) (11/11 0423)  Intake/Output from previous day: 11/10 0701 - 11/11 0700 In: 0  Out: 3100 [Urine:100] Intake/Output this shift:    Physical Exam:  His urine has old lysing clot color. There is no sign of active bleeding.  Lab Results:  Basename 02/07/12 0435 02/06/12 0815 02/05/12 0855  HGB 8.7* 8.7* 8.4*  HCT 27.3* 27.3* 27.6*   BMET  Basename 02/07/12 0435 02/06/12 0815  NA 136 139  K 3.8 4.5  CL 101 105  CO2 23 16*  GLUCOSE 105* 85  BUN 27* 40*  CREATININE 6.01* 7.43*  CALCIUM 7.5* 7.7*   No results found for this basename: LABPT:3,INR:3 in the last 72 hours No results found for this basename: LABURIN:1 in the last 72 hours Results for orders placed during the hospital encounter of 01/21/12  CLOSTRIDIUM DIFFICILE BY PCR     Status: Normal   Collection Time   02/02/12  9:35 AM      Component Value Range Status Comment   C difficile by pcr NEGATIVE  NEGATIVE Final   MRSA PCR SCREENING     Status: Normal   Collection Time   02/04/12 11:18 PM      Component Value Range Status Comment   MRSA by PCR NEGATIVE  NEGATIVE Final     Studies/Results: Dg Chest Port 1 View  02/05/2012  *RADIOLOGY REPORT*  Clinical Data: Renal failure and status post temporary dialysis catheter placement.  PORTABLE CHEST - 1 VIEW  Comparison: 02/04/2012  Findings: Right jugular central line tip lies in the SVC.  No pneumothorax is present.  Overall degree of edema and left pleural  fluid is stable.  Stable cardiomegaly.  IMPRESSION: Central line placement with catheter tip in SVC and no pneumothorax.  Stable interstitial edema and left pleural effusion.   Original Report Authenticated By: Irish Lack, M.D.     Assessment/Plan: Hematuria: This appears to have stopped. He continues to have dark blood in the urine consistent with lysing intravesical and intra-prostatic clots. His hemoglobin appears to be remaining stable and there doesn't appear to be any active bleeding at this time. I would not recommend starting anticoagulation yet however.  He will continue to receive hemodialysis.  His Foley catheter will remain indwelling for now.  None urologic recommendations.   LOS: 17 days   Kathe Wirick C 02/07/2012, 7:22 AM

## 2012-02-07 NOTE — Procedures (Addendum)
[facility]  On HD via R IJ temporary HD cath BP--146/96 Goal--4.8 L Hgb--8.7   K+--3.8 Fe/TIBC/ferritin sent today (on Aranesp) CLIP process underway for outpatient dialysis [facility] and AVF planned for tomorrow since it looks like Tracy [name] now has ESRD (stage 5 CKD).

## 2012-02-07 NOTE — Progress Notes (Signed)
Pt had a 9 beat run of Vtach while at rest. Pt denies any distress. VS stable. Md was notified no new orders received. Will cont to monitor.

## 2012-02-07 NOTE — Progress Notes (Signed)
PT Cancellation Note  Patient Details Name: Tracy [name] MRN: [id number] DOB: 06-26-1919   Cancelled Treatment:    Reason Eval/Treat Not Completed: Other (comment) (Pt politely declining; very tired tired post HD)  Pt states he will get up tomorrow pre or post procedure; Will f/u tomorrow   Van Clines [facility] 02/07/2012, 3:40 PM

## 2012-02-07 NOTE — Progress Notes (Signed)
VASCULAR PROGRESS NOTE  SUBJECTIVE: No complaints  PHYSICAL EXAM: Filed Vitals:   02/07/12 0730 02/07/12 0800 02/07/12 0817 02/07/12 0900  BP: 155/99 147/104 165/105 150/90  Pulse: 87 90 88 87  Temp:   97.4 F (36.3 C) 97.8 F (36.6 C)  TempSrc:   Oral Tympanic  Resp: 14 14 18 20   Height:      Weight:   178 lb 9.2 oz (81 kg)   SpO2:   95% 97%   Palpable left radial pulse  LABS: Lab Results  Component Value Date   WBC 8.1 02/07/2012   HGB 8.7* 02/07/2012   HCT 27.3* 02/07/2012   MCV 91.6 02/07/2012   PLT 67* 02/07/2012   Lab Results  Component Value Date   CREATININE 6.01* 02/07/2012   Lab Results  Component Value Date   INR 1.41 01/22/2012   CBG (last 3)  No results found for this basename: GLUCAP:3 in the last 72 hours   ASSESSMENT/PLAN: 1.For left AVF and Diatek tomorrow. I have explained the indications for placement of an AV fistula or AV graft. I've explained that if at all possible we will place an AV fistula.  I have reviewed the risks of placement of an AV fistula including but not limited to: failure of the fistula to mature, need for subsequent interventions, and thrombosis. All the patient's questions were answered and they are agreeable to proceed with surgery.We will also place a Diatek catheter.  Cari Caraway Beeper: [phone] 02/07/2012

## 2012-02-08 ENCOUNTER — Inpatient Hospital Stay (HOSPITAL_COMMUNITY): Payer: Medicare Other

## 2012-02-08 ENCOUNTER — Encounter (HOSPITAL_COMMUNITY): Payer: Self-pay | Admitting: Anesthesiology

## 2012-02-08 ENCOUNTER — Inpatient Hospital Stay (HOSPITAL_COMMUNITY): Payer: Medicare Other | Admitting: Anesthesiology

## 2012-02-08 ENCOUNTER — Encounter (HOSPITAL_COMMUNITY): Payer: Medicare Other

## 2012-02-08 ENCOUNTER — Encounter (HOSPITAL_COMMUNITY)
Admission: RE | Disposition: A | Discharge status: Home / Self Care | Payer: Self-pay | Source: Ambulatory Visit | Attending: Urology

## 2012-02-08 HISTORY — PX: AV FISTULA PLACEMENT: SHX1204

## 2012-02-08 HISTORY — PX: INSERTION OF DIALYSIS CATHETER: SHX1324

## 2012-02-08 LAB — CBC
MCV: 92.2 fL (ref 78.0–100.0)
RBC: 3.06 MIL/uL — ABNORMAL LOW (ref 4.22–5.81)
RDW: 17.9 % — ABNORMAL HIGH (ref 11.5–15.5)
WBC: 10 10*3/uL (ref 4.0–10.5)

## 2012-02-08 LAB — BASIC METABOLIC PANEL
Calcium: 7.4 mg/dL — ABNORMAL LOW (ref 8.4–10.5)
Creatinine, Ser: 5.52 mg/dL — ABNORMAL HIGH (ref 0.50–1.35)
GFR calc Af Amer: 9 mL/min — ABNORMAL LOW (ref >90)
GFR calc non Af Amer: 8 mL/min — ABNORMAL LOW (ref >90)
Sodium: 139 mEq/L (ref 135–145)

## 2012-02-08 LAB — FERRITIN: Ferritin: 337 ng/mL — ABNORMAL HIGH (ref 22–322)

## 2012-02-08 LAB — PROTIME-INR
INR: 1.43 (ref 0.00–1.49)
Prothrombin Time: 17.1 seconds — ABNORMAL HIGH (ref 11.6–15.2)

## 2012-02-08 LAB — PARATHYROID HORMONE, INTACT (NO CA): PTH: 700.7 pg/mL — ABNORMAL HIGH (ref 14.0–72.0)

## 2012-02-08 SURGERY — ARTERIOVENOUS (AV) FISTULA CREATION
Anesthesia: Monitor Anesthesia Care | Site: Neck | Laterality: Right | Wound class: Clean

## 2012-02-08 MED ORDER — FENTANYL CITRATE 0.05 MG/ML IJ SOLN: 25.0000 ug | INTRAMUSCULAR | Status: DC | PRN

## 2012-02-08 MED ORDER — LIDOCAINE-EPINEPHRINE (PF) 1 %-1:200000 IJ SOLN
INTRAMUSCULAR | Status: AC
  Filled 2012-02-08: qty 10

## 2012-02-08 MED ORDER — PROPOFOL INFUSION 10 MG/ML OPTIME
INTRAVENOUS | Status: DC | PRN
  Administered 2012-02-08: 50 ug/kg/min via INTRAVENOUS

## 2012-02-08 MED ORDER — LIDOCAINE HCL (PF) 1 % IJ SOLN
INTRAMUSCULAR | Status: DC | PRN
  Administered 2012-02-08: 5 mL

## 2012-02-08 MED ORDER — HEPARIN SODIUM (PORCINE) 1000 UNIT/ML IJ SOLN
INTRAMUSCULAR | Status: DC | PRN
  Administered 2012-02-08: 5000 [IU] via INTRAVENOUS

## 2012-02-08 MED ORDER — HEPARIN SODIUM (PORCINE) 1000 UNIT/ML IJ SOLN
INTRAMUSCULAR | Status: DC | PRN
  Administered 2012-02-08: 4.6 [IU]

## 2012-02-08 MED ORDER — HEPARIN SODIUM (PORCINE) 1000 UNIT/ML IJ SOLN
INTRAMUSCULAR | Status: AC
  Filled 2012-02-08: qty 1

## 2012-02-08 MED ORDER — LIDOCAINE HCL (CARDIAC) 20 MG/ML IV SOLN
INTRAVENOUS | Status: DC | PRN
  Administered 2012-02-08: 60 mg via INTRAVENOUS

## 2012-02-08 MED ORDER — PROTAMINE SULFATE 10 MG/ML IV SOLN
INTRAVENOUS | Status: DC | PRN
  Administered 2012-02-08: 30 mg via INTRAVENOUS

## 2012-02-08 MED ORDER — LIDOCAINE HCL (PF) 1 % IJ SOLN
INTRAMUSCULAR | Status: AC
  Filled 2012-02-08: qty 30

## 2012-02-08 MED ORDER — 0.9 % SODIUM CHLORIDE (POUR BTL) OPTIME
TOPICAL | Status: DC | PRN
  Administered 2012-02-08: 1000 mL

## 2012-02-08 MED ORDER — LIDOCAINE-EPINEPHRINE (PF) 1 %-1:200000 IJ SOLN
INTRAMUSCULAR | Status: DC | PRN
  Administered 2012-02-08: 8 mL

## 2012-02-08 MED ORDER — SODIUM CHLORIDE 0.9 % IV SOLN
INTRAVENOUS | Status: DC
  Administered 2012-02-08 (×2): via INTRAVENOUS

## 2012-02-08 MED ORDER — SODIUM CHLORIDE 0.9 % IR SOLN
Status: DC | PRN
  Administered 2012-02-08: 11:00:00

## 2012-02-08 MED ORDER — MIDAZOLAM HCL 5 MG/5ML IJ SOLN
INTRAMUSCULAR | Status: DC | PRN
  Administered 2012-02-08: 1 mg via INTRAVENOUS

## 2012-02-08 MED ORDER — SODIUM CHLORIDE 0.9 % IV SOLN
INTRAVENOUS | Status: DC | PRN
  Administered 2012-02-08: 11:00:00 via INTRAVENOUS

## 2012-02-08 MED ORDER — PROPOFOL INFUSION 10 MG/ML OPTIME: INTRAVENOUS | Status: DC | PRN

## 2012-02-08 SURGICAL SUPPLY — 68 items
ADH SKN CLS APL DERMABOND .7 (GAUZE/BANDAGES/DRESSINGS) ×4
BAG DECANTER FOR FLEXI CONT (MISCELLANEOUS) ×2 IMPLANT
CANISTER SUCTION 2500CC (MISCELLANEOUS) ×3 IMPLANT
CATH CANNON HEMO 15F 50CM (CATHETERS) IMPLANT
CATH CANNON HEMO 15FR 19 (HEMODIALYSIS SUPPLIES) IMPLANT
CATH CANNON HEMO 15FR 23CM (HEMODIALYSIS SUPPLIES) ×1 IMPLANT
CATH CANNON HEMO 15FR 31CM (HEMODIALYSIS SUPPLIES) IMPLANT
CATH CANNON HEMO 15FR 32 (HEMODIALYSIS SUPPLIES) IMPLANT
CATH CANNON HEMO 15FR 32CM (HEMODIALYSIS SUPPLIES) IMPLANT
CHLORAPREP W/TINT 26ML (MISCELLANEOUS) ×3 IMPLANT
CLIP TI MEDIUM 6 (CLIP) ×3 IMPLANT
CLIP TI WIDE RED SMALL 6 (CLIP) ×4 IMPLANT
CLOTH BEACON ORANGE TIMEOUT ST (SAFETY) ×3 IMPLANT
COVER PROBE W GEL 5X96 (DRAPES) ×3 IMPLANT
COVER SURGICAL LIGHT HANDLE (MISCELLANEOUS) ×3 IMPLANT
DECANTER SPIKE VIAL GLASS SM (MISCELLANEOUS) ×3 IMPLANT
DERMABOND ADVANCED (GAUZE/BANDAGES/DRESSINGS) ×2
DERMABOND ADVANCED .7 DNX12 (GAUZE/BANDAGES/DRESSINGS) ×2 IMPLANT
DRAIN PENROSE 1/2X12 LTX STRL (WOUND CARE) IMPLANT
DRAPE C-ARM 42X72 X-RAY (DRAPES) ×3 IMPLANT
DRAPE CHEST BREAST 15X10 FENES (DRAPES) ×3 IMPLANT
ELECT REM PT RETURN 9FT ADLT (ELECTROSURGICAL) ×3
ELECTRODE REM PT RTRN 9FT ADLT (ELECTROSURGICAL) ×2 IMPLANT
GAUZE SPONGE 2X2 8PLY STRL LF (GAUZE/BANDAGES/DRESSINGS) ×2 IMPLANT
GAUZE SPONGE 4X4 16PLY XRAY LF (GAUZE/BANDAGES/DRESSINGS) ×3 IMPLANT
GLOVE BIO SURGEON STRL SZ 6.5 (GLOVE) ×1 IMPLANT
GLOVE BIO SURGEON STRL SZ7.5 (GLOVE) ×4 IMPLANT
GLOVE BIOGEL PI IND STRL 6.5 (GLOVE) IMPLANT
GLOVE BIOGEL PI IND STRL 7.0 (GLOVE) IMPLANT
GLOVE BIOGEL PI IND STRL 7.5 (GLOVE) IMPLANT
GLOVE BIOGEL PI IND STRL 8 (GLOVE) ×2 IMPLANT
GLOVE BIOGEL PI INDICATOR 6.5 (GLOVE) ×1
GLOVE BIOGEL PI INDICATOR 7.0 (GLOVE) ×1
GLOVE BIOGEL PI INDICATOR 7.5 (GLOVE) ×1
GLOVE BIOGEL PI INDICATOR 8 (GLOVE) ×1
GLOVE SURG SS PI 6.0 STRL IVOR (GLOVE) ×1 IMPLANT
GLOVE SURG SS PI 6.5 STRL IVOR (GLOVE) ×1 IMPLANT
GLOVE SURG SS PI 7.5 STRL IVOR (GLOVE) ×1 IMPLANT
GOWN STRL NON-REIN LRG LVL3 (GOWN DISPOSABLE) ×7 IMPLANT
KIT BASIN OR (CUSTOM PROCEDURE TRAY) ×3 IMPLANT
KIT ROOM TURNOVER OR (KITS) ×3 IMPLANT
NDL 18GX1X1/2 (RX/OR ONLY) (NEEDLE) ×2 IMPLANT
NDL HYPO 25GX1X1/2 BEV (NEEDLE) ×2 IMPLANT
NEEDLE 18GX1X1/2 (RX/OR ONLY) (NEEDLE) ×3 IMPLANT
NEEDLE 22X1 1/2 (OR ONLY) (NEEDLE) ×2 IMPLANT
NEEDLE HYPO 25GX1X1/2 BEV (NEEDLE) ×3 IMPLANT
NS IRRIG 1000ML POUR BTL (IV SOLUTION) ×3 IMPLANT
PACK CV ACCESS (CUSTOM PROCEDURE TRAY) ×3 IMPLANT
PACK SURGICAL SETUP 50X90 (CUSTOM PROCEDURE TRAY) ×2 IMPLANT
PAD ARMBOARD 7.5X6 YLW CONV (MISCELLANEOUS) ×6 IMPLANT
SPONGE GAUZE 2X2 STER 10/PKG (GAUZE/BANDAGES/DRESSINGS) ×1
SPONGE GAUZE 4X4 12PLY (GAUZE/BANDAGES/DRESSINGS) ×2 IMPLANT
SPONGE SURGIFOAM ABS GEL 100 (HEMOSTASIS) IMPLANT
SUT ETHILON 3 0 PS 1 (SUTURE) ×3 IMPLANT
SUT PROLENE 6 0 BV (SUTURE) ×3 IMPLANT
SUT VIC AB 3-0 SH 27 (SUTURE) ×3
SUT VIC AB 3-0 SH 27X BRD (SUTURE) ×2 IMPLANT
SUT VICRYL 4-0 PS2 18IN ABS (SUTURE) ×3 IMPLANT
SYR 20CC LL (SYRINGE) ×5 IMPLANT
SYR 30ML LL (SYRINGE) IMPLANT
SYR 5ML LL (SYRINGE) ×6 IMPLANT
SYR CONTROL 10ML LL (SYRINGE) ×3 IMPLANT
SYRINGE 10CC LL (SYRINGE) ×3 IMPLANT
TAPE CLOTH SURG 4X10 WHT LF (GAUZE/BANDAGES/DRESSINGS) ×1 IMPLANT
TOWEL OR 17X24 6PK STRL BLUE (TOWEL DISPOSABLE) ×3 IMPLANT
TOWEL OR 17X26 10 PK STRL BLUE (TOWEL DISPOSABLE) ×3 IMPLANT
UNDERPAD 30X30 INCONTINENT (UNDERPADS AND DIAPERS) ×3 IMPLANT
WATER STERILE IRR 1000ML POUR (IV SOLUTION) ×3 IMPLANT

## 2012-02-08 NOTE — Preoperative (Signed)
Beta Blockers   Reason not to administer Beta Blockers:Not Applicable 

## 2012-02-08 NOTE — Interval H&P Note (Signed)
History and Physical Interval Note:  02/08/2012 10:06 AM  Tracy [name]  has presented today for surgery, with the diagnosis of ESRD  The various methods of treatment have been discussed with the patient and family. After consideration of risks, benefits and other options for treatment, the patient has consented to  Procedure(s) (LRB) with comments: ARTERIOVENOUS (AV) FISTULA CREATION (Left) - POSSIBLE GRAFT INSERTION INSERTION OF DIALYSIS CATHETER (N/A) as a surgical intervention .  The patient's history has been reviewed, patient examined, no change in status, stable for surgery.  I have reviewed the patient's chart and labs.  Questions were answered to the patient's satisfaction.     Dhamar Gregory S

## 2012-02-08 NOTE — Anesthesia Preprocedure Evaluation (Addendum)
Anesthesia Evaluation  Patient identified by MRN, date of birth, ID band Patient awake    Reviewed: Allergy & Precautions, H&P , NPO status , Patient's Chart, lab work & pertinent test results  History of Anesthesia Complications Negative for: history of anesthetic complications  Airway Mallampati: II      Dental  (+) Dental Advisory Given and Poor Dentition   Pulmonary shortness of breath and with exertion,          Cardiovascular hypertension, Pt. on home beta blockers and Pt. on medications + CAD, + Past MI and +CHF + dysrhythmias Atrial Fibrillation     Neuro/Psych    GI/Hepatic negative GI ROS, Neg liver ROS,   Endo/Other  negative endocrine ROS  Renal/GU ARF and DialysisRenal disease     Musculoskeletal   Abdominal   Peds  Hematology   Anesthesia Other Findings   Reproductive/Obstetrics                        Anesthesia Physical Anesthesia Plan  ASA: IV  Anesthesia Plan: MAC   Post-op Pain Management:    Induction: Intravenous  Airway Management Planned: Mask  Additional Equipment:   Intra-op Plan:   Post-operative Plan:   Informed Consent:   Plan Discussed with: CRNA, Anesthesiologist and Surgeon  Anesthesia Plan Comments:         Anesthesia Quick Evaluation

## 2012-02-08 NOTE — Progress Notes (Signed)
Physical Therapy Treatment Patient Details Name: SHANDON BURLINGAME MRN: [id number] DOB: 1919-06-05 Today's Date: 02/08/2012 Time: 4098-1191 PT Time Calculation (min): 33 min  PT Assessment / Plan / Recommendation Comments on Treatment Session  Continues with limited activity tolerance, but did participate today. We plan on progressive ambulation over the next few sessions.   Pt is agreeable to post acute inpatient rehab (SNF vs CIR) to maximize independence, safety, and activity tolerance prior to DC home.   Is it worth considering a rehab consult?   Took orthostatic vitals which were negative for orthostatic hypotension.     Follow Up Recommendations  CIR     Does the patient have the potential to tolerate intense rehabilitation   Potentially  Barriers to Discharge        Equipment Recommendations  Other (comment) (Pt considering stair rider for home)    Recommendations for Other Services    Frequency Min 3X/week   Plan Discharge plan needs to be updated    Precautions / Restrictions Precautions Precautions: Fall Precaution Comments: Check vitals, Vtach reported last night Restrictions Weight Bearing Restrictions: No   Pertinent Vitals/Pain See doc flowsheets for orthostatic vital signs (which were negative for orthostatic hypotension). Session conducted on room air. O2 sat was 88% at end of session, and nursing student replaced O2 via nasal cannula.  Pt reported dizziness during session; however, it did not worsen with postural changes.    Mobility  Bed Mobility Bed Mobility: Supine to Sit;Sit to Supine;Sitting - Scoot to [place] of Bed;Scooting to HOB Supine to Sit: 4: Min assist;HOB elevated (HOB elevated to 26 degrees) Sitting - Scoot to Edge of Bed: 6: Modified independent (Device/Increase time) (Used bed rail for assistance) Sit to Supine: 7: Independent;HOB elevated (HOB elevated to 26 degrees) Scooting to HOB: 1: +2 Total assist (Pt pushed w/ feet; BUE support at  elbow) Scooting to [facility]: Patient Percentage: 30% Details for Bed Mobility Assistance: extra time Transfers Transfers: Sit to Stand;Stand to Sit Sit to Stand: 4: Min assist;From bed;With upper extremity assist (hands on walker despite safety cues) Stand to Sit: 4: Min guard;With upper extremity assist;To bed (Hands on walker despite safety cues) Details for Transfer Assistance: Pt stood for 3 min and requested to sit. Contined to stand for 2 additional min per PT request (Pt request to sit due to weakness, not dizziness) Ambulation/Gait Ambulation/Gait Assistance: Not tested (comment) Pt declining amb today   Exercises     PT Diagnosis:    PT Problem List:   PT Treatment Interventions:     PT Goals Acute Rehab PT Goals PT Goal Formulation: With patient Time For Goal Achievement: 02/22/12 (Goal update today) Potential to Achieve Goals: Good Pt will go Supine/Side to Sit: with modified independence;with HOB 0 degrees (Current goal continues to be appropriate.) PT Goal: Supine/Side to Sit - Progress: Other (comment) (Goal updated) Pt will go Sit to Stand: with supervision (Goal continues to be appropriate.) PT Goal: Sit to Stand - Progress: Other (comment) (Updated today) Pt will go Stand to Sit: with supervision;Other (comment) (Goal continues to be appropriate) PT Goal: Stand to Sit - Progress: Other (comment) (Goal updated today.) Pt will Ambulate: >150 feet;with least restrictive assistive device;with supervision (Goal continues to be appropriate) PT Goal: Ambulate - Progress: Other (comment) (Goal updated today.) Pt will Go Up / Down Stairs: Flight;with least restrictive assistive device;with supervision;Other (comment) (Goal continues to be appropriate) PT Goal: Up/Down Stairs - Progress: Other (comment) (Goal updated today.)  Visit Information  Last PT Received On: 02/07/12 Assistance Needed: +1    Subjective Data  Subjective: Pt reported weakness while standing    Cognition  Overall Cognitive Status: Appears within functional limits for tasks assessed/performed Arousal/Alertness: Lethargic Orientation Level: Appears intact for tasks assessed Behavior During Session: Lethargic    Balance  Balance Balance Assessed: Yes Static Sitting Balance Static Sitting - Balance Support: Left upper extremity supported;Feet supported (Both feet on ground, LUE on bed rail) Static Sitting - Level of Assistance: 6: Modified independent (Device/Increase time) Static Sitting - Comment/# of Minutes: 10 total min (Sat 5 min pre and 5 min post standing) Static Standing Balance Static Standing - Balance Support: Bilateral upper extremity supported (Hands on rolling walker) Static Standing - Level of Assistance: 4: Min assist Static Standing - Comment/# of Minutes: 5 min  End of Session PT - End of Session Equipment Utilized During Treatment: Gait belt Activity Tolerance: Patient limited by fatigue Patient left: in bed;with call bell/phone within reach;with bed alarm set;with nursing in room Nurse Communication: Other (comment) (O2 sat 88% on RA, nursing student restarted supplemental O2)   GP     Olen Pel [place], [place] [phone]  02/08/2012, 10:41 AM

## 2012-02-08 NOTE — Progress Notes (Signed)
OT Cancellation Note  Patient Details Name: Tracy [name] MRN: [id number] DOB: [birth year]   Cancelled Treatment:    Reason Eval/Treat Not Completed: Medical issues which prohibited therapy. AV fistula placement  [facility], OTR/L  [phone] 02/08/2012 02/08/2012, 2:48 PM

## 2012-02-08 NOTE — Anesthesia Postprocedure Evaluation (Signed)
  Anesthesia Post-op Note  Patient: Tracy [name]  Procedure(s) Performed: Procedure(s) (LRB) with comments: ARTERIOVENOUS (AV) FISTULA CREATION (Left) INSERTION OF DIALYSIS CATHETER (Right) - Diatek exchange Right Internal Jugular  Patient Location: PACU  Anesthesia Type:MAC  Level of Consciousness: awake  Airway and Oxygen Therapy: Patient Spontanous Breathing  Post-op Pain: mild  Post-op Assessment: Post-op Vital signs reviewed  Post-op Vital Signs: Reviewed  Complications: No apparent anesthesia complications

## 2012-02-08 NOTE — Op Note (Signed)
NAME: Tracy [name]    MRN: [id number] DOB: June 08, 1919    DATE OF OPERATION: 02/08/2012  PREOP DIAGNOSIS: chronic kidney disease  POSTOP DIAGNOSIS: chronic kidney disease  PROCEDURE:  1. Placement of right IJ dietetic catheter 2. Left brachiocephalic AV fistula  SURGEON: Di Kindle. Edilia Bo, MD, FACS  ASSIST: Della Goo PA  ANESTHESIA: local with sedation   EBL: minimal  INDICATIONS: Tracy [name] is a 78 y.o. male who presents for new access.  FINDINGS: 4.5 millimeter cephalic vein.  TECHNIQUE: The patient was brought to the operating room sedated by anesthesia. The neck and upper chest were prepped and draped in the usual sterile fashion. The skin was anesthetized at the site where his temporary catheter into the right IJ. Sutures were cut and the catheter retracted into the wound and then clamped and divided. A guidewire was introduced through the catheter and the catheter was removed over the wire. The dilator and peel-away sheath were advanced over the wire and the wire and dilator removed. A 23 cm catheter was advanced to the peel-away sheath and positioned in the right atrium. The peel-away sheath was removed. The exit site the catheter was selected and the skin anesthetized between the 2 areas. Catheter was brought through a tunnel cut to the appropriate length and the distal pouch were attached. Both ports withdrew easily we then flushed with heparinized saline and filled with concentrated heparin. The catheter was secured at its exit site with 3-0 nylon suture. The IJ cannulation site was closed with a subcuticular stitch. Sterile dressing was applied.   Next attention was turned to placement of a left upper arm AV fistula to the left upper extremity was prepped and draped in the usual sterile fashion. After the skin was anesthetized with 1% lidocaine, a transverse incision was made just above the antecubital level. The cephalic vein was dissected free and ligated distally. It  irrigated up nicely with heparinized saline. It was an approximately 4.5 mm vein. The brachial artery was dissected free beneath the fascia. Patient was heparinized. The brachial artery was clamped proximally and distally and a longitudinal arteriotomy was made. The vein was sewn in the side to the artery using continuous 6-0 Prolene suture. The patient was a good thrill in the fistula. The heparin was partially reversed with protamine. There was a good radial and ulnar signal with the Doppler. Hemostasis was obtained in the wound and the wound was closed with a deep layer of 3-0 Vicryl and the skin closed with Vicryl. Dermabond was applied, the patient tolerated the procedure well and was transferred to the recovery room in stable condition. All needle and sponge counts were correct.  Waverly Ferrari, MD, FACS [facility]  DATE OF DICTATION:   02/08/2012

## 2012-02-08 NOTE — H&P (View-Only) (Signed)
VASCULAR PROGRESS NOTE  SUBJECTIVE: No complaints  PHYSICAL EXAM: Filed Vitals:   02/07/12 0730 02/07/12 0800 02/07/12 0817 02/07/12 0900  BP: 155/99 147/104 165/105 150/90  Pulse: 87 90 88 87  Temp:   97.4 F (36.3 C) 97.8 F (36.6 C)  TempSrc:   Oral Tympanic  Resp: 14 14 18 20  Height:      Weight:   178 lb 9.2 oz (81 kg)   SpO2:   95% 97%   Palpable left radial pulse  LABS: Lab Results  Component Value Date   WBC 8.1 02/07/2012   HGB 8.7* 02/07/2012   HCT 27.3* 02/07/2012   MCV 91.6 02/07/2012   PLT 67* 02/07/2012   Lab Results  Component Value Date   CREATININE 6.01* 02/07/2012   Lab Results  Component Value Date   INR 1.41 01/22/2012   CBG (last 3)  No results found for this basename: GLUCAP:3 in the last 72 hours   ASSESSMENT/PLAN: 1.For left AVF and Diatek tomorrow. I have explained the indications for placement of an AV fistula or AV graft. I've explained that if at all possible we will place an AV fistula.  I have reviewed the risks of placement of an AV fistula including but not limited to: failure of the fistula to mature, need for subsequent interventions, and thrombosis. All the patient's questions were answered and they are agreeable to proceed with surgery.We will also place a Diatek catheter.  Chris Dickson Beeper: [phone] 02/07/2012    

## 2012-02-08 NOTE — Transfer of Care (Signed)
Immediate Anesthesia Transfer of Care Note  Patient: Tracy [name]  Procedure(s) Performed: Procedure(s) (LRB) with comments: ARTERIOVENOUS (AV) FISTULA CREATION (Left) INSERTION OF DIALYSIS CATHETER (Right) - Diatek exchange Right Internal Jugular  Patient Location: PACU  Anesthesia Type:MAC  Level of Consciousness: awake, alert  and oriented  Airway & Oxygen Therapy: Patient Spontanous Breathing and Patient connected to nasal cannula oxygen  Post-op Assessment: Report given to PACU RN and Post -op Vital signs reviewed and stable  Post vital signs: Reviewed and stable  Complications: No apparent anesthesia complications

## 2012-02-08 NOTE — Progress Notes (Signed)
Subjective: Complains of pain in L antecub area and R IJ folowing AVF and TDC placement yesterday  Objective: Vital signs in last 24 hours: Temp:  [97.1 F (36.2 C)-98.3 F (36.8 C)] 98 F (36.7 C) (11/12 1356) Pulse Rate:  [81-102] 95  (11/12 1356) Resp:  [18-22] 22  (11/12 1356) BP: (124-148)/(70-90) 129/80 mmHg (11/12 1356) SpO2:  [89 %-98 %] 98 % (11/12 1356) Weight:  [81.194 kg (179 lb)] 81.194 kg (179 lb) (11/11 2033) Weight change: -7.4 kg (-16 lb 5 oz)  Intake/Output from previous day: 11/11 0701 - 11/12 0700 In: -  Out: 4425 [Urine:475] Intake/Output this shift: Total I/O In: 465 [P.O.:240; I.V.:225] Out: 61 [Urine:20; Emesis/NG output:1; Blood:40] EXAM: General appearance:  Alert, in no apparent distress Resp:  CTA without rales, rhonchi, or wheezes Cardio:  RRR without murmur or rub GI: + BS, soft and nontender Extremities: 1+ edema bilaterally Access:  Right IJ catheter, new AVF @ LUA with + bruit  Lab Results:  Basename 02/08/12 0625 02/07/12 0435  WBC 10.0 8.1  HGB 9.0* 8.7*  HCT 28.2* 27.3*  PLT 69* 67*   BMET:  Basename 02/08/12 0625 02/07/12 0435 02/06/12 0815  NA 139 136 --  K 3.6 3.8 --  CL 102 101 --  CO2 25 23 --  GLUCOSE 109* 105* --  BUN 20 27* --  CREATININE 5.52* 6.01* --  CALCIUM 7.4* 7.5* --  ALBUMIN -- 2.5* 2.6*    Basename 02/07/12 0435  PTH 700.7*   Iron Studies:  Basename 02/07/12 0435  IRON 75  TIBC 202*  TRANSFERRIN --  FERRITIN --   Assessment/Plan: 1. Acute on chronic renal failure - likely Stage V, s/p HD on 11/9, 10, & 11 with temporary catheter; BUN/Cr 20/5.52 yesterday;   received right IJ catheter with creation of AVF @ LUA per Dr. Edilia Bo.  HD today 2. Volume overload - Improved on dialysis, but chest x-ray today with mild increase of edema.  3. HTN - BP improved with volume removal, now 129/80 on Metoprolol 50 mg bid. 4. Hx BPH - s/p TURP 10/25. 5. Gross hematuria/thrombocytopenia - Plts 69 today, no Heparin  with HD. 6. Hx A-fib and CAD - s/p stents. 7. Hx CVA 8. Myelodysplastic syndrome    LOS: 18 days   LYLES,CHARLES 02/08/2012,2:51 PM Tracy [name] has been approved for HD at [id number].  CXR still show vol overload.  Will continue to UF/HD to clear up CXR and periph edema.  If BP decreases will decrease metoprolol Foley cath still in--very little urine output.   D/C IV NS 50/hr Phosphorus 4.4 on 11 Nov Begin nephrovites

## 2012-02-08 NOTE — OR Nursing (Signed)
Diatek catheter placement [id number] Creation of Left arm fistula 1144-

## 2012-02-09 ENCOUNTER — Encounter (HOSPITAL_COMMUNITY): Payer: Self-pay | Admitting: Vascular Surgery

## 2012-02-09 ENCOUNTER — Telehealth: Payer: Self-pay | Admitting: Vascular Surgery

## 2012-02-09 ENCOUNTER — Other Ambulatory Visit: Payer: Self-pay | Admitting: *Deleted

## 2012-02-09 ENCOUNTER — Ambulatory Visit: Payer: Medicare Other | Admitting: Internal Medicine

## 2012-02-09 ENCOUNTER — Other Ambulatory Visit: Payer: Medicare Other | Admitting: Lab

## 2012-02-09 DIAGNOSIS — Z4931 Encounter for adequacy testing for hemodialysis: Secondary | ICD-10-CM

## 2012-02-09 DIAGNOSIS — N186 End stage renal disease: Secondary | ICD-10-CM

## 2012-02-09 LAB — CBC
Hemoglobin: 8.5 g/dL — ABNORMAL LOW (ref 13.0–17.0)
Platelets: 73 10*3/uL — ABNORMAL LOW (ref 150–400)
RBC: 2.98 MIL/uL — ABNORMAL LOW (ref 4.22–5.81)

## 2012-02-09 LAB — RENAL FUNCTION PANEL
CO2: 23 mEq/L (ref 19–32)
Chloride: 103 mEq/L (ref 96–112)
Creatinine, Ser: 7.22 mg/dL — ABNORMAL HIGH (ref 0.50–1.35)
GFR calc Af Amer: 7 mL/min — ABNORMAL LOW (ref >90)
GFR calc non Af Amer: 6 mL/min — ABNORMAL LOW (ref >90)
Potassium: 3.7 mEq/L (ref 3.5–5.1)
Sodium: 136 mEq/L (ref 135–145)

## 2012-02-09 MED ORDER — ALTEPLASE 2 MG IJ SOLR
2.0000 mg | Freq: Once | INTRAMUSCULAR | Status: DC | PRN
  Filled 2012-02-09: qty 2

## 2012-02-09 MED ORDER — SODIUM CHLORIDE 0.9 % IV SOLN: 100.0000 mL | INTRAVENOUS | Status: DC | PRN

## 2012-02-09 MED ORDER — NEPRO/CARBSTEADY PO LIQD
237.0000 mL | ORAL | Status: DC | PRN
  Filled 2012-02-09: qty 237

## 2012-02-09 MED ORDER — HEPARIN SODIUM (PORCINE) 1000 UNIT/ML DIALYSIS: 1000.0000 [IU] | INTRAMUSCULAR | Status: DC | PRN

## 2012-02-09 MED ORDER — LIDOCAINE-PRILOCAINE 2.5-2.5 % EX CREA
1.0000 "application " | TOPICAL_CREAM | CUTANEOUS | Status: DC | PRN
  Filled 2012-02-09: qty 5

## 2012-02-09 MED ORDER — LIDOCAINE HCL (PF) 1 % IJ SOLN: 5.0000 mL | INTRAMUSCULAR | Status: DC | PRN

## 2012-02-09 MED ORDER — HEPARIN SODIUM (PORCINE) 1000 UNIT/ML DIALYSIS: 20.0000 [IU]/kg | INTRAMUSCULAR | Status: DC | PRN

## 2012-02-09 MED ORDER — RENA-VITE PO TABS
1.0000 | ORAL_TABLET | Freq: Every day | ORAL | Status: DC
  Administered 2012-02-09 – 2012-02-11 (×3): 1 via ORAL
  Filled 2012-02-09 (×4): qty 1

## 2012-02-09 MED ORDER — PENTAFLUOROPROP-TETRAFLUOROETH EX AERO: 1.0000 "application " | INHALATION_SPRAY | CUTANEOUS | Status: DC | PRN

## 2012-02-09 NOTE — Progress Notes (Signed)
VASCULAR PROGRESS NOTE  SUBJECTIVE: Pain adequately controlled.  PHYSICAL EXAM:  Filed Vitals:   02/09/12 0622  BP: 136/86  Pulse: 85  Temp: 97.5 F (36.4 C)  Resp: 18   Cath site looks fine. Good thrill in left BC AVF Incision OK  ASSESSMENT/PLAN: 1. 1 Day Post-Op s/p: Diatek and L B-C AVF 2. I will arrange F/U in 6 weeks to check on the maturation of his fistula. 3. Will be available if needed.  Cari Caraway Beeper: [phone] 02/09/2012

## 2012-02-09 NOTE — Progress Notes (Signed)
OT CANCELLATION NOTE   Attempted to see pt this pm. Pt in HD. Will attempt again later. [facility], OTR/L  [phone] 02/09/2012

## 2012-02-09 NOTE — Progress Notes (Signed)
Subjective:   Pain in left arm and at new catheter site s/p surgery yesterday; otherwise, no dyspnea   Objective: Vital signs in last 24 hours: Temp:  [97.1 F (36.2 C)-98 F (36.7 C)] 97.9 F (36.6 C) (11/13 0942) Pulse Rate:  [81-95] 90  (11/13 0942) Resp:  [16-22] 16  (11/13 0942) BP: (116-139)/(60-86) 118/64 mmHg (11/13 0942) SpO2:  [90 %-98 %] 90 % (11/13 0942) Weight:  [81.693 kg (180 lb 1.6 oz)] 81.693 kg (180 lb 1.6 oz) (11/12 2248) Weight change: 0.693 kg (1 lb 8.4 oz)  Intake/Output from previous day: 11/12 0701 - 11/13 0700 In: 465 [P.O.:240; I.V.:225] Out: 561 [Urine:520; Emesis/NG output:1; Blood:40]   EXAM: General appearance:  Alert, in no apparent distress Resp:  CTA without rales, rhonchi, or wheezes Cardio:  RRR without murmur or rub GI:  + BS, soft and nontender Extremities: Trace-1+ edema bilaterally Access:  Right IJ catheter, AVF @ LUA with + bruit  Lab Results:  [facility] 02/09/12 0613 02/08/12 0625  WBC 13.7* 10.0  HGB 8.5* 9.0*  HCT 27.7* 28.2*  PLT 73* 69*   BMET:  Basename 02/08/12 0625 02/07/12 0435  NA 139 136  K 3.6 3.8  CL 102 101  CO2 25 23  GLUCOSE 109* 105*  BUN 20 27*  CREATININE 5.52* 6.01*  CALCIUM 7.4* 7.5*  ALBUMIN -- 2.5*    Basename 02/07/12 0435  PTH 700.7*   Iron Studies:  Basename 02/07/12 0435  IRON 75  TIBC 202*  TRANSFERRIN --  FERRITIN 337*   Assessment/Plan: 1. Acute on chronic renal failure - likely Stage V, s/p HD on 11/9, 10, 11, & 12; BUN/Cr 20/5.52, K 3.6, Ca 7.4 (8.6 corrected) yesterday; received right IJ catheter with creation of AVF @ LUA per Dr. Edilia Bo yesterday. HD with 2K/2.5Ca bath tomorrow. 2. Volume overload - Improved on dialysis.  Still with pretibial edema and CXR showed pulm edema yesterday.  Will dialyze again today to reduce ECF vol excess.   3. HTN - BP improved with volume removal, now 118/64 on Metoprolol 50 mg bid. May need to decrease BP meds if BP falls with UF/fluid  removal 4. Anemia - Hgb down to 8.5 today, s/p Aranesp 60 mcg on Mon, but surgery yesterday. Increase Aranesp to 100/wk 5. Hx BPH - s/p TURP 10/25. Dr Vernie Ammons to see today 6. Gross hematuria/thrombocytopenia - Plts 69 today, no Heparin with HD. 7. Hx A-fib and CAD - s/p stents. 8. Hx CVA 9. Myelodysplastic syndrome     LOS: 19 days   LYLES,CHARLES 02/09/2012,9:42 AM I saw and examined Mr. Tornatore.  Agree with plans as outlined above.

## 2012-02-09 NOTE — Progress Notes (Signed)
1 Day Post-Op Subjective: Patient reports He was having some soreness in his left arm but otherwise no complaint.  Objective: Vital signs in last 24 hours: Temp:  [97.1 F (36.2 C)-98 F (36.7 C)] 97.9 F (36.6 C) (11/13 0942) Pulse Rate:  [81-95] 90  (11/13 0942) Resp:  [16-22] 16  (11/13 0942) BP: (116-139)/(60-86) 118/64 mmHg (11/13 0942) SpO2:  [90 %-98 %] 90 % (11/13 0942) Weight:  [81.693 kg (180 lb 1.6 oz)] 81.693 kg (180 lb 1.6 oz) (11/12 2248)  Intake/Output from previous day: 11/12 0701 - 11/13 0700 In: 465 [P.O.:240; I.V.:225] Out: 561 [Urine:520; Emesis/NG output:1; Blood:40] Intake/Output this shift: Total I/O In: 120 [P.O.:120] Out: -   Physical Exam:  His Foley catheter continues to drain. I see no clots. It is blood-tinged. Lab Results:  [facility] 02/09/12 0613 02/08/12 0625 02/07/12 0435  HGB 8.5* 9.0* 8.7*  HCT 27.7* 28.2* 27.3*   BMET  Basename 02/08/12 0625 02/07/12 0435  NA 139 136  K 3.6 3.8  CL 102 101  CO2 25 23  GLUCOSE 109* 105*  BUN 20 27*  CREATININE 5.52* 6.01*  CALCIUM 7.4* 7.5*    Basename 02/08/12 0625  LABPT --  INR 1.43   No results found for this basename: LABURIN:1 in the last 72 hours Results for orders placed during the hospital encounter of 01/21/12  CLOSTRIDIUM DIFFICILE BY PCR     Status: Normal   Collection Time   02/02/12  9:35 AM      Component Value Range Status Comment   C difficile by pcr NEGATIVE  NEGATIVE Final   MRSA PCR SCREENING     Status: Normal   Collection Time   02/04/12 11:18 PM      Component Value Range Status Comment   MRSA by PCR NEGATIVE  NEGATIVE Final     Studies/Results: Dg Chest Port 1 View  02/08/2012  *RADIOLOGY REPORT*  Clinical Data: Renal failure and status post dialysis catheter placement.  PORTABLE CHEST - 1 VIEW  Comparison: 02/04/2029  Findings: The right jugular tunneled dialysis catheter present with distal tips separated in the lower SVC.  No pneumothorax.  Mild increase in  pulmonary edema since prior study.  Aeration of the left lower lung is improved.  IMPRESSION: Satisfactory appearance of the newly placed tunneled dialysis catheter.  No pneumothorax present.  Mild increase noted of pulmonary edema.   Original Report Authenticated By: Irish Lack, M.D.    Dg Fluoro Guide Cv Line-no Report  02/08/2012  CLINICAL DATA: Dialysis catheter placement   FLOURO GUIDE CV LINE  Fluoroscopy was utilized by the requesting physician.  No radiographic  interpretation.      Assessment/Plan: This catheter continues to drain. We discussed voiding trial but he wanted hold off on that.  Continue Foley drainage for now.   LOS: 19 days   Dashayla Theissen C 02/09/2012, 12:08 PM

## 2012-02-09 NOTE — Progress Notes (Signed)
Rehab Admissions Coordinator Note:  Patient was screened by Clois Dupes for appropriateness for an Inpatient Acute Rehab Consult. Asked by PT to assess if an inpatient rehab admission may be approved. I met with patient at bedside in hemodialysis with a brief introduction. He requests me to speak to his wife concerning disposition. I called his wife and discussed therapy recommendations for rehab at inpt rehab or SNF prior to d/c home. She states she feels pt will need some sort of rehab but aware that he is very angry and frustrated in his current physical condition for he was totally independent prior to this admission. Pt's son is a physical therapist in [place] and has been discussing the need for rehab per his wife. I have encouraged wife to call pt's son to discuss the need for rehab and to discuss with her husband the need to work with therapy as much as possible to be able to participate and to eventually return home and go to outpatient dialysis. At this time, we are recommending an Inpatient Rehab consult to assess if he will be a candidate and agree to admission. Please call me with any questions.  Clois Dupes, RN 02/09/2012, 5:04 PM  I can be reached at [phone].

## 2012-02-09 NOTE — Telephone Encounter (Signed)
Message copied by Fredrich Birks on Wed Feb 09, 2012  9:40 AM ------      Message from: [place], [place] K      Created: Wed Feb 09, 2012  7:45 AM      Regarding: schedule                   ----- Message -----         From: Melene Plan, RN         Sent: 02/08/2012   3:59 PM           To: Sharee Pimple, CMA, [facility]      Subject: FW: charge                                                           ----- Message -----         From: Chuck Hint, MD         Sent: 02/08/2012  12:44 PM           To: Reuel Derby, Melene Plan, RN      Subject: charge                                                   PROCEDURE:       1. Placement of right IJ dietetic catheter      2. Left brachiocephalic AV fistula            SURGEON: Di Kindle. Edilia Bo, MD, FACS            ASSIST: Della Goo PA            He needs a follow up visit in 6 weeks to check on the maturation of this fistula.      He needs a duplex of his fistula at that time. Thank you. CSD

## 2012-02-09 NOTE — Telephone Encounter (Signed)
Spoke with patients wife to notify of follow up appointment on 03/15/12 @ 2:00pm and also sent letter, dpm

## 2012-02-09 NOTE — Procedures (Signed)
,  [facility]  On HD via R IJ TDC BP--106/74 Goal--4.8 L (4 L removed so far) Hgb--8.5   K+--3.7 (on 4 K bath) Tolerating well so far.

## 2012-02-09 NOTE — Progress Notes (Signed)
PT Cancellation Note  Patient Details Name: Tracy [name] MRN: [id number] DOB: [birth year]   Cancelled Treatment:    Reason Eval/Treat Not Completed: Pain limiting ability to participate. Pt reported pain in right shoulder and left arm at 6.5/10 when trying to move arms. Pt seemed lethargic and stated he wanted to sleep. Pt stated he would not participate today, and he would like to "start exercising tomorrow." Pt stated goal is rehab.   Van Clines [facility] 02/09/2012, 10:31 AM

## 2012-02-09 NOTE — Progress Notes (Signed)
02/09/2012 3:21 PM Hemodialysis Outpatient Note; This patient has been accepted at the [facility] dialysis on a Mon/Wed/Fri 2nd shift schedule. The center can begin treatment on Fri 02/11/12 at 1115 AM.  Thank you.Tilman Neat

## 2012-02-09 NOTE — Clinical Social Work Note (Signed)
CSW reviewed note from B. Boyette with CIR. Patient currently in dialysis. CSW attempted to reach wife at home and message left. CSW will talk with patient and/or wife 11/14 regarding d/c planning.  Genelle Bal, MSW, LCSW [phone]

## 2012-02-10 ENCOUNTER — Encounter (HOSPITAL_COMMUNITY): Payer: Medicare Other

## 2012-02-10 ENCOUNTER — Inpatient Hospital Stay (HOSPITAL_COMMUNITY): Payer: Medicare Other

## 2012-02-10 DIAGNOSIS — R5381 Other malaise: Secondary | ICD-10-CM

## 2012-02-10 LAB — CBC
HCT: 28.1 % — ABNORMAL LOW (ref 39.0–52.0)
MCV: 92.4 fL (ref 78.0–100.0)
Platelets: 82 10*3/uL — ABNORMAL LOW (ref 150–400)
RBC: 3.04 MIL/uL — ABNORMAL LOW (ref 4.22–5.81)
WBC: 13 10*3/uL — ABNORMAL HIGH (ref 4.0–10.5)

## 2012-02-10 LAB — RENAL FUNCTION PANEL
BUN: 23 mg/dL (ref 6–23)
CO2: 25 mEq/L (ref 19–32)
Chloride: 103 mEq/L (ref 96–112)
Creatinine, Ser: 6.27 mg/dL — ABNORMAL HIGH (ref 0.50–1.35)
GFR calc non Af Amer: 7 mL/min — ABNORMAL LOW (ref >90)
Potassium: 3.8 mEq/L (ref 3.5–5.1)

## 2012-02-10 MED ORDER — DARBEPOETIN ALFA-POLYSORBATE 60 MCG/0.3ML IJ SOLN: 100.0000 ug | INTRAMUSCULAR | Status: DC

## 2012-02-10 NOTE — Progress Notes (Addendum)
Pt's wife left me a message last night after touring the inpt rehab center. She would like to pursue an inpt rehab admission if pt felt to benefit and participate. Please order an inpt rehab consult so my rehabilitation physician can fully assess pt's potential for an inpt rehab admission. I will follow up today. [phone] I spoke with pt's son, Reuel Boom, in [place] by phone. He prefers an inpt rehab admission if pt qualifies.

## 2012-02-10 NOTE — Progress Notes (Signed)
Physical Therapy Treatment Patient Details Name: Tracy [name] MRN: [id number] DOB: 08/23/1919 Today's Date: 02/10/2012 Time: 1501-1550 PT Time Calculation (min): 49 min  PT Assessment / Plan / Recommendation Comments on Treatment Session  Pt admitted with anemia; pt walked 20 ft with three sitting rest breaks; this is an improvement of activity tolerance from the last session, even with having HD earlier today; agree with D/C to CIR to maximize independence and safety with mobility prior to D/C home    Follow Up Recommendations  CIR     Does the patient have the potential to tolerate intense rehabilitation     Barriers to Discharge        Equipment Recommendations  Other (comment) (will defer to next venue of care)    Recommendations for Other Services    Frequency Min 3X/week   Plan Discharge plan needs to be updated    Precautions / Restrictions Precautions Precautions: Fall Restrictions Weight Bearing Restrictions: No   Pertinent Vitals/Pain 6/10 in left arm where fistula recently placed Session conducted on RA; O2 stats quite variable ranging from 84 (lowest observed with questionable artifact due to inconsistent wave) to 97.    Mobility  Bed Mobility Bed Mobility: Supine to Sit;Sit to Supine;Sitting - Scoot to Edge of Bed Supine to Sit: 4: Min guard;With rails;HOB elevated (without physical contact; HOB slightly elevated) Sitting - Scoot to Edge of Bed: 4: Min guard (without physical contact) Sit to Supine: 4: Min guard;With rail;HOB elevated (without physical contact; HOB slightly elevated) Transfers Transfers: Sit to Stand;Stand to Sit;Stand Pivot Transfers Sit to Stand: 3: Mod assist;With upper extremity assist;From bed (light mod assist) Stand to Sit: 4: Min assist;With upper extremity assist;To chair/3-in-1 Stand Pivot Transfers: 3: Mod assist Details for Transfer Assistance: Multiple reps of sit to stand; mostly to and from recliner; safety cues; pt is tending  to pull up on walker; physical assist to steady rolling walker; kept chair very close because pt would sit without warning Ambulation/Gait Ambulation/Gait Assistance: 1: +2 Total assist Ambulation/Gait: Patient Percentage: 70% Ambulation Distance (Feet): 20 Feet (with two seated rest breaks) Assistive device: Rolling walker Ambulation/Gait Assistance Details: Assisted with walker management; second person pushing recliner very close for safety;cued for safety and to self monitor for activity tolerance; trunk flexed posture and decreased step height; became dyspneic with exertion and required two seated rest breaks (lasting 3-4 min); pt very motivated even when offered to stop walking he chose to continue with one more bout of walking to make it to the door Gait Pattern: Decreased stride length;Trunk flexed Gait velocity: decreased and very unsteady Stairs: No    Exercises     PT Diagnosis:    PT Problem List:   PT Treatment Interventions:     PT Goals Acute Rehab PT Goals Time For Goal Achievement: 02/22/12 Potential to Achieve Goals: Good Pt will go Supine/Side to Sit: with modified independence;with HOB 0 degrees PT Goal: Supine/Side to Sit - Progress: Progressing toward goal Pt will go Sit to Stand: with supervision PT Goal: Sit to Stand - Progress: Progressing toward goal Pt will go Stand to Sit: with supervision;Other (comment) PT Goal: Stand to Sit - Progress: Progressing toward goal Pt will Ambulate: >150 feet;with least restrictive assistive device;with supervision PT Goal: Ambulate - Progress: Progressing toward goal Pt will Go Up / Down Stairs: Flight;with least restrictive assistive device;with supervision;Other (comment)  Visit Information  Last PT Received On: 02/10/12 Assistance Needed: +1    Subjective Data  Subjective:  Pt reported weakness and stated goal to walk to door. (Pt motivated by possibility of CIR) Patient Stated Goal: Pt would like CIR before D/C home    Cognition  Overall Cognitive Status: Appears within functional limits for tasks assessed/performed Arousal/Alertness: Awake/alert Orientation Level: Appears intact for tasks assessed Behavior During Session: [facility] for tasks performed    Balance     End of Session PT - End of Session Activity Tolerance: Patient tolerated treatment well;Patient limited by fatigue (pt very motivated and pushed himself despite fatigue) Patient left: in bed;with call bell/phone within reach Nurse Communication: Mobility status;Other (comment) (pt dry heaving; requesting anti-nausea meds)   GP     Van Clines [facility] [place], [place] [phone] 02/10/2012, 4:34 PM

## 2012-02-10 NOTE — Progress Notes (Signed)
OT Cancellation Note  Patient Details Name: Tracy [name] MRN: [id number] DOB: 1919-09-18   Cancelled Treatment:     Treatment cancelled due to pt at procedure (HD). Will re-attempt as time allows when pt returns this afternoon.   02/10/2012 Cipriano Mile OTR/L Pager [phone] Office [phone]

## 2012-02-10 NOTE — Consult Note (Signed)
Physical Medicine and Rehabilitation Consult Reason for Consult: Deconditioning Referring Physician:  Dr. Caryn Section   HPI: Tracy [name] is a 78 y.o. male with hx of CKD stage IV (2.5-3.0), HTN, TIA/CVA, L TKR, CAD w stents in place, afib, diast HF and Myelodysplastic syndrome who was having Symptomatic anemia and he was admitted 10/24 and underwent TURP on 10/25. Post-op had delayed onset of gross hematuria treated with Amicar, bladder irrigation and on 11/1 he had cystoscopy with clot evacuation. Hospital course complicated by ABLA requiring multiple units of PRBC and platelets, syncope with dizziness and lethargy due to anemia and electrolyte abnormality. He developed acute on chronic renal failure with uremic symptoms and fluid overload requiring initiation of HD. Left AV fistula and R-IJ cath placed by Dr. Edilia Bo on 02/08/12. Therapies initiated--last a week ago indicating unsteady gait. MD, family requesting CIR for progression.    Review of Systems  Respiratory: Negative for shortness of breath.   Cardiovascular: Negative for chest pain and palpitations.  Genitourinary:       Indwelling foley in place draining bloody urine.   Musculoskeletal: Negative for myalgias.       LUE discomfort due to recent surgery. H/o unsteady gait.   Neurological: Negative for headaches.   Past Medical History  Diagnosis Date  . CAD (coronary artery disease)     status post prior PCI to the obtuse marginal in 1997 and stenting to the mid to distal RCA in 2001; LHC 9/06:  LM ok, pLAD 50%, mLAD 60% then 60-70%, mRI 50%, mOM1 80-90% (small), pRCA and dRCA stents ok, PDA 50%, EF 65%;  Myoview 5/12: No ischemia, EF 64%.;  NSTEMI 11/12 tx medically   . Chronic kidney disease, stage III (moderate)   . TIA (transient ischemic attack)   . HTN (hypertension)   . Hyperlipidemia   . AF (atrial fibrillation) 02/16/11    Coumadin ==> patient decided to stop after admxn with worsening anemia in setting of UGI bleed, epistaxis   . Arthritis     "in my left ankle"  . Chronic diastolic heart failure   . Carotid stenosis     dopplers 03/2011: 0-39% bilateral  . MDS (myelodysplastic syndrome) 12/16/2011  . Hx of echocardiogram     Echocardiogram 02/17/11: Severe LVH, asymmetric hypertrophy, EF 50-55%, normal wall motion, high ventricular filling pressure, mild LAE, mild RVE, mildly reduced RVSF, mild RAE.  Marland Kitchen Myocardial infarction 2012  . Stroke     5 yrs ago.  Mini - NO RESIDUAL PROBLEMS  . Anemia of chronic disease     RECENT HOSPITALIZATION / BLOOD TRANSFUSION OCT 2013  . Shortness of breath     WITH ANY ACTIVITY---  NO OXYGEN  . CHF (congestive heart failure)     CHRONIC DIASTOLIC HEART FAILURE   Past Surgical History  Procedure Date  . Nose surgery 1986    deviated septum  . Total knee arthroplasty 2009    left  . Joint replacement 2009    left knee  . Surgery scrotal / testicular 197O    CORRECTIVE SURGERY TO VARICOCELE (SWELLING OF SCROTUM)  . Esophagogastroduodenoscopy 12/09/2011    Procedure: ESOPHAGOGASTRODUODENOSCOPY (EGD);  Surgeon: Willis Modena, MD;  Location: [facility];  Service: Endoscopy;  Laterality: N/A;  outlaw/ebp  . Tonsillectomy and adenoidectomy 1929  . Appendectomy 1930's  . Left ear mastoid surgery 1933  . Removal of one testicle 1944  . Correction to botched varicocele surgery 1970  . Eye surgery 1993    SURGERY  FOR DETACHED RETINA LEFT EYE  . Left cataract extraction with lens implant 1993  . Coronary angioplasty with stent placement     3 procedures; 3 stents total  1994 & 1996  . Right cataract extraction with lens implant   1997  . Transurethral resection of prostate 01/21/2012    Procedure: TRANSURETHRAL RESECTION OF THE PROSTATE WITH GYRUS INSTRUMENTS;  Surgeon: [name], MD;  Location: [facility];  Service: Urology;  Laterality: N/A;  . Cystoscopy 01/28/2012    Procedure: CYSTOSCOPY;  Surgeon: [name], MD;  Location: [facility];  Service: Urology;  Laterality:  N/A;  cystoscopy   . Hematoma evacuation 01/28/2012    Procedure: EVACUATION HEMATOMA;  Surgeon: [name], MD;  Location: [facility];  Service: Urology;  Laterality: N/A;   with fulgeration evacuation of clot  . Av fistula placement 02/08/2012    Procedure: ARTERIOVENOUS (AV) FISTULA CREATION;  Surgeon: Chuck Hint, MD;  Location: [facility] OR;  Service: Vascular;  Laterality: Left;  . Insertion of dialysis catheter 02/08/2012    Procedure: INSERTION OF DIALYSIS CATHETER;  Surgeon: Chuck Hint, MD;  Location: [facility] OR;  Service: Vascular;  Laterality: Right;  Diatek exchange Right Internal Jugular   History reviewed. No pertinent family history.  Social History: Married. Used cane out of home setting due to unsteadiness. He  reports that he quit smoking about 33 years ago. His smoking use included Cigarettes and Pipe. He has a 80 pack-year smoking history. He has never used smokeless tobacco. He reports that he drinks alcohol- beer once a week and wine/mixed drink once a month.  He reports that he does not use illicit drugs.   Allergies  Allergen Reactions  . Oxycodone Hcl Other (See Comments)    Wanted to climb the wall  . Prednisone Other (See Comments)    Gain weight    Medications Prior to Admission  Medication Sig Dispense Refill  . acetaminophen (TYLENOL) 500 MG tablet Take 500 mg by mouth every 6 (six) hours as needed. For pain      . metoprolol (LOPRESSOR) 50 MG tablet Take 1 tablet (50 mg total) by mouth 2 (two) times daily.  60 tablet  11  . nitrofurantoin (MACRODANTIN) 100 MG capsule Take 100 mg by mouth 2 (two) times daily after a meal. X 7 DAYS    STARTED Monday 01/17/12   TAKES WITH FOOD OR MILK      . nitroGLYCERIN (NITROSTAT) 0.4 MG SL tablet Place 0.4 mg under the tongue every 5 (five) minutes as needed. For chest pain      . [DISCONTINUED] Tamsulosin HCl (FLOMAX) 0.4 MG CAPS Take 0.8 mg by mouth at bedtime. Take 2 tabs daily      . Darbepoetin Alfa-Polysorbate  (ARANESP, ALBUMIN FREE,) 150 MCG/0.75ML SOLN Inject 150 mg as directed every 7 (seven) days. Friday.      . isosorbide mononitrate (IMDUR) 30 MG 24 hr tablet Take 30 mg by mouth every evening.        Home: Home Living Lives With: Spouse Available Help at Discharge: Family;Available 24 hours/day Type of Home: House Home Access: Stairs to enter [employer] of Steps: 2 Entrance Stairs-Rails: Right Home Layout: Two level;Bed/bath upstairs Alternate Level Stairs-Number of Steps: 13 Alternate Level Stairs-Rails: Right Bathroom Shower/Tub: Engineer, manufacturing systems: Standard Home Adaptive Equipment: Straight cane;Tub transfer bench  Functional History: Prior Function Able to Take Stairs?: Yes (pt has to stop to rest to get to 2nd floor, wants  stair lift) Driving: Yes Vocation: Retired Comments: retired Multimedia programmer Status:  Mobility: Bed Mobility Bed Mobility: Supine to Sit;Sit to Supine;Sitting - Scoot to [employer] of Bed;Scooting to [facility] Rolling Left: 4: Min guard Left Sidelying to Sit: 4: Min assist;With rails;HOB flat Supine to Sit: 4: Min assist;HOB elevated (HOB elevated to 26 degrees) Sitting - Scoot to Edge of Bed: 6: Modified independent (Device/Increase time) (Used bed rail for assistance) Sit to Supine: 7: Independent;HOB elevated (HOB elevated to 26 degrees) Scooting to HOB: 1: +2 Total assist (Pt pushed w/ feet; BUE support at elbow) Scooting to [facility]: Patient Percentage: 30% Transfers Transfers: Sit to Stand;Stand to Sit Sit to Stand: 4: Min assist;From bed;With upper extremity assist (hands on walker despite safety cues) Stand to Sit: 4: Min guard;With upper extremity assist;To bed (Hands on walker despite safety cues) Ambulation/Gait Ambulation/Gait Assistance: Not tested (comment) Ambulation/Gait: Patient Percentage: 70% Ambulation Distance (Feet): 40 Feet (then 30x1, 10 x 1) Assistive device: Rolling walker Ambulation/Gait  Assistance Details: Assist to steady throughout with cues for maintaing position inside of RW, esp with turns.  Pt continues to be very unsteady, however no c/o dizziness during pm session.  Gait Pattern: Step-to pattern;Decreased stride length Gait velocity: decreased and very unsteady General Gait Details: no LOB, pt took 3 standing rest breaks due to 2/4 dyspnea which he stated is normal for him since having heart attack last year Stairs: No    ADL: ADL Eating/Feeding: Performed;Set up Where Assessed - Eating/Feeding: Edge of bed Grooming: Performed;Wash/dry face;Brushing hair Where Assessed - Grooming: Unsupported sitting Upper Body Bathing: Simulated;Minimal assistance Where Assessed - Upper Body Bathing: Unsupported sitting Lower Body Bathing: Simulated;Moderate assistance Where Assessed - Lower Body Bathing: Supported sit to stand Upper Body Dressing: Simulated;Minimal assistance Where Assessed - Upper Body Dressing: Unsupported sitting Lower Body Dressing: Simulated;Moderate assistance Where Assessed - Lower Body Dressing: Supported sit to stand Toilet Transfer: Mining engineer Method: Surveyor, minerals:  (bed to chair) Tub/Shower Transfer Method: Not assessed Transfers/Ambulation Related to ADLs: Once pt sat up, he became nauseated.  Did not feel up to standing at sink after this.  Transferred to recliner.   ADL Comments: Pt did not feel up to bathing today, although he reports that he was sweaty last night.  therapist did wash back.  Pt needs extra time for all activities.  Dyspnea 2/4, on 02  Cognition: Cognition Arousal/Alertness: Lethargic Orientation Level: Oriented X4 Cognition Overall Cognitive Status: Appears within functional limits for tasks assessed/performed Arousal/Alertness: Lethargic Orientation Level: Appears intact for tasks assessed Behavior During Session: Lethargic Cognition - Other Comments: Seems  somewhat unconcerned about dizziness and felt as through he should still walk down the hall  Blood pressure 136/61, pulse 89, temperature 98 F (36.7 C), temperature source Oral, resp. rate 15, height 6\' 1"  (1.854 m), weight 77.8 kg (171 lb 8.3 oz), SpO2 94.00%. Physical Exam  Nursing note and vitals reviewed. Constitutional: He is oriented to person, place, and time.       Thin frail appearing male.   HENT:  Head: Normocephalic and atraumatic.  Eyes: Pupils are equal, round, and reactive to light. Left conjunctiva has a hemorrhage.  Cardiovascular: Normal rate and regular rhythm.   Pulmonary/Chest: Effort normal. He has decreased breath sounds in the right lower field and the left lower field.  Abdominal: Soft. Bowel sounds are normal.  Musculoskeletal: He exhibits tenderness (Left antecutibal area with diffuse ecchymosis and min edema due to recent  surgery.  Incision clean, dry and intact.).  Neurological: He is alert and oriented to person, place, and time.   4/5 strength in bilateral hip flexors knee extensors ankle dorsiflexors 4+/5 strength in bilateral deltoid, biceps, triceps, grip Sensation intact to light touch in the upper and lower extremities Results for orders placed during the hospital encounter of 01/21/12 (from the past 24 hour(s))  RENAL FUNCTION PANEL     Status: Abnormal   Collection Time   02/09/12  1:59 PM      Component Value Range   Sodium 136  135 - 145 mEq/L   Potassium 3.7  3.5 - 5.1 mEq/L   Chloride 103  96 - 112 mEq/L   CO2 23  19 - 32 mEq/L   Glucose, Bld 133 (*) 70 - 99 mg/dL   BUN 30 (*) 6 - 23 mg/dL   Creatinine, Ser 5.28 (*) 0.50 - 1.35 mg/dL   Calcium 7.4 (*) 8.4 - 10.5 mg/dL   Phosphorus 3.3  2.3 - 4.6 mg/dL   Albumin 2.4 (*) 3.5 - 5.2 g/dL   GFR calc non Af Amer 6 (*) >90 mL/min   GFR calc Af Amer 7 (*) >90 mL/min  CBC     Status: Abnormal   Collection Time   02/10/12  7:31 AM      Component Value Range   WBC 13.0 (*) 4.0 - 10.5 K/uL    RBC 3.04 (*) 4.22 - 5.81 MIL/uL   Hemoglobin 8.7 (*) 13.0 - 17.0 g/dL   HCT 41.3 (*) 24.4 - 01.0 %   MCV 92.4  78.0 - 100.0 fL   MCH 28.6  26.0 - 34.0 pg   MCHC 31.0  30.0 - 36.0 g/dL   RDW 27.2 (*) 53.6 - 64.4 %   Platelets 82 (*) 150 - 400 K/uL  RENAL FUNCTION PANEL     Status: Abnormal   Collection Time   02/10/12  7:31 AM      Component Value Range   Sodium 139  135 - 145 mEq/L   Potassium 3.8  3.5 - 5.1 mEq/L   Chloride 103  96 - 112 mEq/L   CO2 25  19 - 32 mEq/L   Glucose, Bld 123 (*) 70 - 99 mg/dL   BUN 23  6 - 23 mg/dL   Creatinine, Ser 0.34 (*) 0.50 - 1.35 mg/dL   Calcium 7.8 (*) 8.4 - 10.5 mg/dL   Phosphorus 2.9  2.3 - 4.6 mg/dL   Albumin 2.4 (*) 3.5 - 5.2 g/dL   GFR calc non Af Amer 7 (*) >90 mL/min   GFR calc Af Amer 8 (*) >90 mL/min   Dg Chest Port 1 View  02/08/2012  *RADIOLOGY REPORT*  Clinical Data: Renal failure and status post dialysis catheter placement.  PORTABLE CHEST - 1 VIEW  Comparison: 02/04/2029  Findings: The right jugular tunneled dialysis catheter present with distal tips separated in the lower SVC.  No pneumothorax.  Mild increase in pulmonary edema since prior study.  Aeration of the left lower lung is improved.  IMPRESSION: Satisfactory appearance of the newly placed tunneled dialysis catheter.  No pneumothorax present.  Mild increase noted of pulmonary edema.   Original Report Authenticated By: Irish Lack, M.D.    Dg Fluoro Guide Cv Line-no Report  02/08/2012  CLINICAL DATA: Dialysis catheter placement   FLOURO GUIDE CV LINE  Fluoroscopy was utilized by the requesting physician.  No radiographic  interpretation.      Assessment/Plan: Diagnosis: Deconditioning  following prolonged hospitalization for acute on chronic anemia as well as acute on chronic renal failure 1. Does the need for close, 24 hr/day medical supervision in concert with the patient's rehab needs make it unreasonable for this patient to be served in a less intensive setting?  Yes 2. Co-Morbidities requiring supervision/potential complications: Atrial fibrillation, myelodysplastic syndrome, systolic and diastolic CHF 3. Due to bowel management, safety, skin/wound care, disease management, medication administration, pain management and patient education, does the patient require 24 hr/day rehab nursing? Yes 4. Does the patient require coordinated care of a physician, rehab nurse, PT (1-2 hrs/day, 5 days/week) and OT (1-2 hrs/day, 5 days/week) to address physical and functional deficits in the context of the above medical diagnosis(es)? Yes Addressing deficits in the following areas: balance, endurance, locomotion, strength, transferring, bowel/bladder control, bathing, dressing, feeding, grooming and toileting 5. Can the patient actively participate in an intensive therapy program of at least 3 hrs of therapy per day at least 5 days per week? Yes 6. The potential for patient to make measurable gains while on inpatient rehab is good 7. Anticipated functional outcomes upon discharge from inpatient rehab are Supervision to modified independent level mobility with PT, Supervision to modified independent level ADLs with OT, Not applicable with SLP. 8. Estimated rehab length of stay to reach the above functional goals is: 7-10 days 9. Does the patient have adequate social supports to accommodate these discharge functional goals? Yes 10. Anticipated D/C setting: Home 11. Anticipated post D/C treatments: HH therapy 12. Overall Rehab/Functional Prognosis: good  RECOMMENDATIONS: This patient's condition is appropriate for continued rehabilitative care in the following setting: CIR Patient has agreed to participate in recommended program. Yes Note that insurance prior authorization may be required for reimbursement for recommended care.  Comment:    02/10/2012

## 2012-02-10 NOTE — Clinical Social Work Placement (Signed)
Clinical Social Work Department CLINICAL SOCIAL WORK PLACEMENT NOTE 02/10/2012  Patient:  Tracy [name], Tracy [name]  Account Number:  [id number] Admit date:  01/21/2012  Clinical Social Worker:  Genelle Bal, LCSW  Date/time:  02/10/2012 09:32 AM  Clinical Social Work is seeking post-discharge placement for this patient at the following level of care:   SKILLED NURSING   (*CSW will update this form in Epic as items are completed)   02/10/2012  Patient/family provided with [facility] Department of Clinical Social Work's list of facilities offering this level of care within the geographic area requested by the patient (or if unable, by the patient's family).  02/10/2012  Patient/family informed of their freedom to choose among providers that offer the needed level of care, that participate in Medicare, Medicaid or managed care program needed by the patient, have an available bed and are willing to accept the patient.    Patient/family informed of [facility]' ownership interest in [facility], as well as of the fact that they are under no obligation to receive care at this facility.  PASARR submitted to EDS on  PASARR number received from EDS on   FL2 transmitted to all facilities in geographic area requested by pt/family on   FL2 transmitted to all facilities within larger geographic area on   Patient informed that his/her managed care company has contracts with or will negotiate with  certain facilities, including the following:     Patient/family informed of bed offers received:   Patient chooses bed at  Physician recommends and patient chooses bed at    Patient to be transferred to  on   Patient to be transferred to facility by   The following physician request were entered in Epic:   Additional Comments:

## 2012-02-10 NOTE — Clinical Social Work Psychosocial (Signed)
Clinical Social Work Department BRIEF PSYCHOSOCIAL ASSESSMENT 02/10/2012  Patient:  Tracy [name], Tracy [name]     Account Number:  [id number]     Admit date:  01/21/2012  Clinical Social Worker:  Delmer Islam  Date/Time:  02/10/2012 09:17 AM  Referred by:  Physician  Date Referred:  02/07/2012 Referred for  SNF Placement   Other Referral:   Interview type:  Patient Other interview type:   CSW initally talked with wife by phone [phone]), then talked with patient.    PSYCHOSOCIAL DATA Living Status:  WIFE Admitted from facility:   Level of care:   Primary support name:  Tracy [name] Primary support relationship to patient:  SPOUSE Degree of support available:   Strong support    CURRENT CONCERNS Current Concerns  Post-Acute Placement   Other Concerns:    SOCIAL WORK ASSESSMENT / PLAN CSW talked with wife by phone regarding discharge planning and the options of inpatient rehab vs SNF. Wife is aware that patient will need rehab and is hopeful that pt can go to inpatient rehab, but if not she understand that he still needs rehab before coming home.    Wife expressed patient's frustration with not being able to go out for their anniversary on the 17th as he had planned and also having very young male therapists work with him.    CSW talked with patient at the bedside and explained my role and prior conversation with him wife for an alternative d/c plan if he is not approved for inpatient rehab. Patient was provided with a SNF list and immediately noted the facility [facility]) that is very near his home. Patient appears to be in agreement with going to SNF for rehab if CIR is not an option   Assessment/plan status:  Psychosocial Support/Ongoing Assessment of Needs Other assessment/ plan:   Information/referral to community resources:   Patient given skilled facility list for [facility]    PATIENT'S/FAMILY'S RESPONSE TO PLAN OF CARE: Mrs. Karel was very appreciative of  CSW's contact and indicated that CSW explained things in a way that she could clearly understand (free of jargon). The patient talked with CSW and is open to rehab at a SNF if needed.

## 2012-02-10 NOTE — Progress Notes (Signed)
Subjective:   No complaints, left arm pain resolving, breathing well.  Objective: Vital signs in last 24 hours: Temp:  [97.8 F (36.6 C)-98.6 F (37 C)] 97.8 F (36.6 C) (11/14 0550) Pulse Rate:  [74-94] 86  (11/14 0550) Resp:  [15-28] 17  (11/14 0550) BP: (92-127)/(52-74) 117/67 mmHg (11/14 0550) SpO2:  [90 %-97 %] 94 % (11/14 0550) Weight:  [76.7 kg (169 lb 1.5 oz)-81.2 kg (179 lb 0.2 oz)] 76.7 kg (169 lb 1.5 oz) (11/13 2114) Weight change: -0.493 kg (-1 lb 1.4 oz)  Intake/Output from previous day: 11/13 0701 - 11/14 0700 In: 120 [P.O.:120] Out: 4300    EXAM: General appearance:  Alert, in no apparent distress Resp:  CTA without rales, rhonchi, or wheezes Cardio:  RRR without murmur or rub GI:  + BS, soft and nontender Extremities:  Trace edema bilaterally Access:  Right IJ catheter, AVF @ LUA with + bruit  Lab Results:  [facility] 02/09/12 0613 02/08/12 0625  WBC 13.7* 10.0  HGB 8.5* 9.0*  HCT 27.7* 28.2*  PLT 73* 69*   BMET:  Basename 02/09/12 1359 02/08/12 0625  NA 136 139  K 3.7 3.6  CL 103 102  CO2 23 25  GLUCOSE 133* 109*  BUN 30* 20  CREATININE 7.22* 5.52*  CALCIUM 7.4* 7.4*  ALBUMIN 2.4* --   No results found for this basename: PTH:2 in the last 72 hours Iron Studies: No results found for this basename: IRON,TIBC,TRANSFERRIN,FERRITIN in the last 72 hours   Assessment/Plan: 1. Acute on chronic renal failure - likely Stage V, s/p HD on 11/9, 10, 11, & 13; BUN/Cr 20/5.52, K 3.6, Ca 7.4 (8.6 corrected) on 11/12; received right IJ catheter with creation of AVF @ LUA per Dr. Edilia Bo 11/12. HD with 2K/2.5Ca bath today.  2. Volume overload - Improved on dialysis, but still with pretibial edema and pulmonary edema per CXR 11/12. 3. HTN - BP improved with volume removal, now 125/67 on Metoprolol 50 mg bid. May need to decrease BP meds if BP falls with UF/fluid removal. 4. Anemia - Hgb down to 8.5 yesterday, s/p Aranesp 60 mcg on Mon, but surgery yesterday.  Increase Aranesp to 100/wk. 5. Hx BPH - s/p TURP 10/25; seem by Dr. Vernie Ammons yesterday, continuing Foley. 6. Gross hematuria/thrombocytopenia - Plts 69 today, no Heparin with HD.  7. Hx A-fib and CAD - s/p stents.  8. Hx CVA  9. Myelodysplastic syndrome   LOS: 20 days   LYLES,CHARLES 02/10/2012,7:41 AM Mr. Callo is currently on dialysis via R IJ TDC.  His weight has come down with dialysis (93.3 kg on 8 Nov, 85.4 kg on 11 Nov, 77.8 kg today).  Will check CXR post HD today to see if CXR pulmonary edema has cleared.  He wants to go home,but is week and has requested to go to rehab.  Will ask PT to see in meantime

## 2012-02-11 ENCOUNTER — Encounter (HOSPITAL_COMMUNITY): Payer: Self-pay | Admitting: Physical Medicine and Rehabilitation

## 2012-02-11 ENCOUNTER — Inpatient Hospital Stay (HOSPITAL_COMMUNITY)
Admission: RE | Admit: 2012-02-11 | Discharge: 2012-02-23 | DRG: 945 | Disposition: A | Discharge status: Home Health | Payer: Medicare Other | Source: Ambulatory Visit | Attending: Physical Medicine & Rehabilitation | Admitting: Physical Medicine & Rehabilitation

## 2012-02-11 DIAGNOSIS — G4700 Insomnia, unspecified: Secondary | ICD-10-CM | POA: Diagnosis present

## 2012-02-11 DIAGNOSIS — R5381 Other malaise: Secondary | ICD-10-CM | POA: Diagnosis present

## 2012-02-11 DIAGNOSIS — I10 Essential (primary) hypertension: Secondary | ICD-10-CM | POA: Diagnosis present

## 2012-02-11 DIAGNOSIS — Z96659 Presence of unspecified artificial knee joint: Secondary | ICD-10-CM

## 2012-02-11 DIAGNOSIS — D62 Acute posthemorrhagic anemia: Secondary | ICD-10-CM | POA: Diagnosis present

## 2012-02-11 DIAGNOSIS — I252 Old myocardial infarction: Secondary | ICD-10-CM

## 2012-02-11 DIAGNOSIS — I509 Heart failure, unspecified: Secondary | ICD-10-CM | POA: Diagnosis present

## 2012-02-11 DIAGNOSIS — I2510 Atherosclerotic heart disease of native coronary artery without angina pectoris: Secondary | ICD-10-CM | POA: Diagnosis present

## 2012-02-11 DIAGNOSIS — N179 Acute kidney failure, unspecified: Secondary | ICD-10-CM | POA: Diagnosis present

## 2012-02-11 DIAGNOSIS — I4891 Unspecified atrial fibrillation: Secondary | ICD-10-CM

## 2012-02-11 DIAGNOSIS — R339 Retention of urine, unspecified: Secondary | ICD-10-CM

## 2012-02-11 DIAGNOSIS — D696 Thrombocytopenia, unspecified: Secondary | ICD-10-CM | POA: Diagnosis present

## 2012-02-11 DIAGNOSIS — N400 Enlarged prostate without lower urinary tract symptoms: Secondary | ICD-10-CM | POA: Diagnosis present

## 2012-02-11 DIAGNOSIS — J90 Pleural effusion, not elsewhere classified: Secondary | ICD-10-CM | POA: Diagnosis present

## 2012-02-11 DIAGNOSIS — Z8673 Personal history of transient ischemic attack (TIA), and cerebral infarction without residual deficits: Secondary | ICD-10-CM

## 2012-02-11 DIAGNOSIS — N19 Unspecified kidney failure: Secondary | ICD-10-CM

## 2012-02-11 DIAGNOSIS — N184 Chronic kidney disease, stage 4 (severe): Secondary | ICD-10-CM | POA: Diagnosis present

## 2012-02-11 DIAGNOSIS — D469 Myelodysplastic syndrome, unspecified: Secondary | ICD-10-CM | POA: Diagnosis present

## 2012-02-11 DIAGNOSIS — I5042 Chronic combined systolic (congestive) and diastolic (congestive) heart failure: Secondary | ICD-10-CM | POA: Diagnosis present

## 2012-02-11 DIAGNOSIS — Z5189 Encounter for other specified aftercare: Principal | ICD-10-CM

## 2012-02-11 DIAGNOSIS — R4182 Altered mental status, unspecified: Secondary | ICD-10-CM | POA: Diagnosis present

## 2012-02-11 DIAGNOSIS — I129 Hypertensive chronic kidney disease with stage 1 through stage 4 chronic kidney disease, or unspecified chronic kidney disease: Secondary | ICD-10-CM | POA: Diagnosis present

## 2012-02-11 DIAGNOSIS — Z79899 Other long term (current) drug therapy: Secondary | ICD-10-CM

## 2012-02-11 MED ORDER — HYDROCODONE-ACETAMINOPHEN 5-325 MG PO TABS: 1.0000 | ORAL_TABLET | ORAL | Status: DC | PRN

## 2012-02-11 MED ORDER — GUAIFENESIN-DM 100-10 MG/5ML PO SYRP: 5.0000 mL | ORAL_SOLUTION | Freq: Four times a day (QID) | ORAL | Status: DC | PRN

## 2012-02-11 MED ORDER — LIDOCAINE HCL (PF) 1 % IJ SOLN
5.0000 mL | INTRAMUSCULAR | Status: DC | PRN
  Filled 2012-02-11: qty 5

## 2012-02-11 MED ORDER — BACITRACIN-NEOMYCIN-POLYMYXIN OINTMENT TUBE
TOPICAL_OINTMENT | Freq: Three times a day (TID) | CUTANEOUS | Status: DC | PRN
  Filled 2012-02-11: qty 15

## 2012-02-11 MED ORDER — LIDOCAINE-PRILOCAINE 2.5-2.5 % EX CREA
1.0000 "application " | TOPICAL_CREAM | CUTANEOUS | Status: DC | PRN
  Filled 2012-02-11: qty 5

## 2012-02-11 MED ORDER — ALUMINUM HYDROXIDE GEL 320 MG/5ML PO SUSP
15.0000 mL | Freq: Four times a day (QID) | ORAL | Status: DC | PRN
  Filled 2012-02-11: qty 30

## 2012-02-11 MED ORDER — METOPROLOL TARTRATE 50 MG PO TABS
50.0000 mg | ORAL_TABLET | Freq: Two times a day (BID) | ORAL | Status: DC
  Administered 2012-02-11: 50 mg via ORAL
  Filled 2012-02-11 (×4): qty 1

## 2012-02-11 MED ORDER — PENTAFLUOROPROP-TETRAFLUOROETH EX AERO: 1.0000 "application " | INHALATION_SPRAY | CUTANEOUS | Status: DC | PRN

## 2012-02-11 MED ORDER — DARBEPOETIN ALFA-POLYSORBATE 60 MCG/0.3ML IJ SOLN
100.0000 ug | INTRAMUSCULAR | Status: DC
  Filled 2012-02-11: qty 0.6

## 2012-02-11 MED ORDER — SODIUM CHLORIDE 0.9 % IV SOLN
100.0000 mL | INTRAVENOUS | Status: DC | PRN
Start: 2012-02-11 — End: 2012-02-23

## 2012-02-11 MED ORDER — TRAZODONE HCL 50 MG PO TABS: 25.0000 mg | ORAL_TABLET | Freq: Every evening | ORAL | Status: DC | PRN

## 2012-02-11 MED ORDER — ONDANSETRON HCL 4 MG PO TABS
4.0000 mg | ORAL_TABLET | Freq: Four times a day (QID) | ORAL | Status: DC | PRN
  Administered 2012-02-16 – 2012-02-17 (×2): 4 mg via ORAL
  Filled 2012-02-11 (×2): qty 1

## 2012-02-11 MED ORDER — POLYETHYLENE GLYCOL 3350 17 G PO PACK
17.0000 g | PACK | Freq: Every day | ORAL | Status: DC | PRN
  Filled 2012-02-11: qty 1

## 2012-02-11 MED ORDER — NEPRO/CARBSTEADY PO LIQD: 237.0000 mL | ORAL | Status: DC | PRN

## 2012-02-11 MED ORDER — HYDROCODONE-ACETAMINOPHEN 5-325 MG PO TABS
1.0000 | ORAL_TABLET | ORAL | Status: DC | PRN
  Administered 2012-02-12 (×2): 1 via ORAL
  Administered 2012-02-13: 2 via ORAL
  Administered 2012-02-14 – 2012-02-15 (×2): 1 via ORAL
  Administered 2012-02-17 – 2012-02-18 (×2): 2 via ORAL
  Filled 2012-02-11 (×4): qty 1
  Filled 2012-02-11 (×4): qty 2

## 2012-02-11 MED ORDER — FLEET ENEMA 7-19 GM/118ML RE ENEM: 1.0000 | ENEMA | Freq: Once | RECTAL | Status: AC | PRN

## 2012-02-11 MED ORDER — SODIUM CHLORIDE 0.9 % IV SOLN: 100.0000 mL | INTRAVENOUS | Status: DC | PRN

## 2012-02-11 MED ORDER — ONDANSETRON HCL 4 MG/2ML IJ SOLN: 4.0000 mg | Freq: Four times a day (QID) | INTRAMUSCULAR | Status: DC | PRN

## 2012-02-11 MED ORDER — RENA-VITE PO TABS
1.0000 | ORAL_TABLET | Freq: Every day | ORAL | Status: DC
  Administered 2012-02-11 – 2012-02-22 (×12): 1 via ORAL
  Filled 2012-02-11 (×13): qty 1

## 2012-02-11 MED ORDER — ACETAMINOPHEN 325 MG PO TABS: 325.0000 mg | ORAL_TABLET | ORAL | Status: DC | PRN

## 2012-02-11 MED ORDER — BISACODYL 10 MG RE SUPP: 10.0000 mg | Freq: Every day | RECTAL | Status: DC | PRN

## 2012-02-11 NOTE — PMR Pre-admission (Signed)
PMR Admission Coordinator Pre-Admission Assessment  Patient: Tracy [name] is an 78 y.o., male MRN: [id number] DOB: 01-10-1920 Height: 6\' 1"  (185.4 cm) Weight: 72.5 kg (159 lb 13.3 oz)  Insurance Information HMO:     PPO:      PCP:      IPA:      80/20: yes     OTHER: no  hmo PRIMARY: Medicare a and b      Policy#: [id number]      Subscriber: pt Benefits:  Phone #: [email or web address]     Name: 02/10/2012 Eff. Date: a7/1/99 b 10/27/89     Deduct: $1184      Out of Pocket Max: none      Life Max: none CIR: 100%      SNF: 20 full days LBD 12/10/11 Outpatient: 80%     Co-Pay: 20% Home Health: 100%      Co-Pay: none DME: 80%     Co-Pay: 20% Providers: pt choice  SECONDARY: AARP supplement      Policy#: [id number]      Subscriber: pt  Emergency Contact Information Contact Information    Name Relation Home Work Mobile   [place] Spouse [phone]  [phone]   [facility] Daughter [phone]  [phone]   Jovonta, Levit [phone]     Current Medical History  Patient Admitting Diagnosis: Deconditioning after prolonged hospitalization for acute on chronic anemia as well as acute chronic renal failure History of Present Illness: Tracy [name] is a 78 y.o. male with hx of CKD stage IV (2.5-3.0), HTN, TIA/CVA, L TKR, CAD w stents in place, afib, diast HF and Myelodysplastic syndrome who was having Symptomatic anemia and he was admitted 10/24 and underwent TURP on 10/25. Post-op had delayed onset of gross hematuria treated with Amicar, bladder irrigation and on 11/1 he had cystoscopy with clot evacuation. Hospital course complicated by ABLA requiring multiple units of PRBC and platelets, syncope with dizziness and lethargy due to anemia and electrolyte abnormality. He developed acute on chronic renal failure with uremic symptoms and fluid overload requiring initiation of HD. Left AV fistula and R-IJ cath placed by Dr. Edilia Bo on 02/08/12. Therapies initiated--last a week  ago indicating unsteady gait.  Past Medical History  Past Medical History  Diagnosis Date  . CAD (coronary artery disease)     status post prior PCI to the obtuse marginal in 1997 and stenting to the mid to distal RCA in 2001; LHC 9/06:  LM ok, pLAD 50%, mLAD 60% then 60-70%, mRI 50%, mOM1 80-90% (small), pRCA and dRCA stents ok, PDA 50%, EF 65%;  Myoview 5/12: No ischemia, EF 64%.;  NSTEMI 11/12 tx medically   . Chronic kidney disease, stage III (moderate)   . TIA (transient ischemic attack)   . HTN (hypertension)   . Hyperlipidemia   . AF (atrial fibrillation) 02/16/11    Coumadin ==> patient decided to stop after admxn with worsening anemia in setting of UGI bleed, epistaxis  . Arthritis     "in my left ankle"  . Chronic diastolic heart failure   . Carotid stenosis     dopplers 03/2011: 0-39% bilateral  . MDS (myelodysplastic syndrome) 12/16/2011  . Hx of echocardiogram     Echocardiogram 02/17/11: Severe LVH, asymmetric hypertrophy, EF 50-55%, normal wall motion, high ventricular filling pressure, mild LAE, mild RVE, mildly reduced RVSF, mild RAE.  Marland Kitchen Myocardial infarction 2012  . Stroke     5 yrs ago.  Mini - NO RESIDUAL PROBLEMS  .  Anemia of chronic disease     RECENT HOSPITALIZATION / BLOOD TRANSFUSION OCT 2013  . Shortness of breath     WITH ANY ACTIVITY---  NO OXYGEN  . CHF (congestive heart failure)     CHRONIC DIASTOLIC HEART FAILURE    Family History  family history is not on file.  Prior Rehab/Hospitalizations: none   Current Medications  Current facility-administered medications:0.9 %  sodium chloride infusion, 100 mL, Intravenous, PRN, Maree Krabbe, MD;  0.9 %  sodium chloride infusion, 100 mL, Intravenous, PRN, Maree Krabbe, MD;  acetaminophen (TYLENOL) tablet 650 mg, 650 mg, Oral, Q4H PRN, [name], MD, 650 mg at 01/22/12 0734;  darbepoetin (ARANESP) injection 100 mcg, 100 mcg, Intravenous, Q7 days, Gerome Apley, PA feeding supplement (NEPRO CARB  STEADY) liquid 237 mL, 237 mL, Oral, PRN, Maree Krabbe, MD, 237 mL at 02/08/12 1554;  fentaNYL (SUBLIMAZE) injection 25-50 mcg, 25-50 mcg, Intravenous, Q5 min PRN, Judie Petit, MD;  HYDROcodone-acetaminophen (NORCO/VICODIN) 5-325 MG per tablet 1-2 tablet, 1-2 tablet, Oral, Q4H PRN, [name], MD, 2 tablet at 02/10/12 2054 lidocaine (XYLOCAINE) 1 % injection 5 mL, 5 mL, Intradermal, PRN, Maree Krabbe, MD;  lidocaine-prilocaine (EMLA) cream 1 application, 1 application, Topical, PRN, Maree Krabbe, MD;  metoprolol (LOPRESSOR) tablet 50 mg, 50 mg, Oral, BID, Maree Krabbe, MD, 50 mg at 02/11/12 1101;  multivitamin (RENA-VIT) tablet 1 tablet, 1 tablet, Oral, Daily, Zada Girt, MD, 1 tablet at 02/11/12 1101 neomycin-bacitracin-polymyxin (NEOSPORIN) ointment, , Topical, TID PRN, [name], MD;  ondansetron [facility]) injection 4 mg, 4 mg, Intravenous, Q4H PRN, [name], MD, 4 mg at 02/10/12 1544;  pentafluoroprop-tetrafluoroeth (GEBAUERS) aerosol 1 application, 1 application, Topical, PRN, Maree Krabbe, MD;  sodium chloride irrigation 0.9 % 3,000 mL, 3,000 mL, Irrigation, Continuous, [name], MD, 3,000 mL at 01/28/12 2101  Patients Current Diet: Renal  Precautions / Restrictions Precautions Precautions: Fall Precaution Comments: Check vitals, Vtach reported last night Restrictions Weight Bearing Restrictions: No   Prior Activity Level Limited Community (1-2x/wk): does go out just a couple of times per week over last year  Journalist, newspaper / Equipment Home Assistive Devices/Equipment: Eyeglasses Home Adaptive Equipment: Straight cane;Tub transfer bench  Prior Functional Level Prior Function Level of Independence:  (uses cane out on the grass and in the community) Able to Take Stairs?: Yes (pt has to stop to rest to get to 2nd floor, wants stair lift) Driving: Yes 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 User may not have seen previous data.) Cognition - Other Comments: Seems somewhat unconcerned about dizziness and felt as through he should still walk down the hall    Extremity Assessment (includes Sensation/Coordination)  RUE ROM/Strength/Tone: WFL for tasks assessed  RLE ROM/Strength/Tone: Within functional levels RLE Sensation: WFL - Light Touch RLE Coordination: WFL - gross/fine motor    ADLs  Eating/Feeding: Performed;Set up Where Assessed - Eating/Feeding: Edge of bed Grooming: Performed;Wash/dry face;Brushing hair Where Assessed - Grooming: Unsupported sitting Upper Body Bathing: Simulated;Minimal assistance Where Assessed - Upper Body Bathing: Unsupported sitting Lower Body Bathing: Simulated;Moderate assistance Where Assessed - Lower Body Bathing: Supported sit to stand Upper Body Dressing: Simulated;Minimal assistance Where Assessed - Upper Body Dressing: Unsupported sitting Lower Body Dressing:  Simulated;Moderate assistance Where Assessed - Lower Body Dressing: Supported sit to Pharmacist, hospital: Mining engineer Method: Surveyor, minerals:  (bed to chair) Toileting - Clothing Manipulation and Hygiene: Performed;Maximal assistance Where Assessed - Engineer, mining and Hygiene: Standing;Sit to stand from 3-in-1 or toilet Tub/Shower Transfer Method: Not assessed Transfers/Ambulation Related to ADLs: Once pt sat up, he became nauseated.  Did not feel up to standing at sink after this.  Transferred to recliner.   ADL Comments: Pt did not feel up to bathing today, although he reports that he was sweaty last night.  therapist did wash back.  Pt needs  extra time for all activities.  Dyspnea 2/4, on 02    Mobility  Bed Mobility: Supine to Sit;Sit to Supine;Sitting - Scoot to Edge of Bed Rolling Left: 4: Min guard Left Sidelying to Sit: 4: Min assist;With rails;HOB flat Supine to Sit: 4: Min guard;With rails;HOB elevated (without physical contact; HOB slightly elevated) Sitting - Scoot to Edge of Bed: 4: Min guard (without physical contact) Sit to Supine: 4: Min guard;With rail;HOB elevated (without physical contact; HOB slightly elevated) Scooting to HOB: 1: +2 Total assist (Pt pushed w/ feet; BUE support at elbow) Scooting to [place]: Patient Percentage: 30%    Transfers  Transfers: Sit to Stand;Stand to Sit;Stand Pivot Transfers Sit to Stand: 3: Mod assist;With upper extremity assist;From bed (light mod assist) Stand to Sit: 4: Min assist;With upper extremity assist;To chair/3-in-1 Stand Pivot Transfers: 3: Mod assist    Ambulation / Gait / Stairs / Wheelchair Mobility  Ambulation/Gait Ambulation/Gait Assistance: 1: +2 Total assist Ambulation/Gait: Patient Percentage: 70% Ambulation Distance (Feet): 20 Feet (with two seated rest breaks) Assistive device: Rolling walker Ambulation/Gait Assistance Details: Assisted with walker management; second person pushing recliner very close for safety;cued for safety and to self monitor for activity tolerance; trunk flexed posture and decreased step height; became dyspneic with exertion and required two seated rest breaks (lasting 3-4 min); pt very motivated even when offered to stop walking he chose to continue with one more bout of walking to make it to the door Gait Pattern: Decreased stride length;Trunk flexed Gait velocity: decreased and very unsteady General Gait Details: no LOB, pt took 3 standing rest breaks due to 2/4 dyspnea which he stated is normal for him since having heart attack last year Stairs: No    Posture / Balance Static Sitting Balance Static Sitting - Balance Support: Left  upper extremity supported;Feet supported (Both feet on ground, LUE on bed rail) Static Sitting - Level of Assistance: 6: Modified independent (Device/Increase time) Static Sitting - Comment/# of Minutes: 10 total min (Sat 5 min pre and 5 min post standing) Static Standing Balance Static Standing - Balance Support: Bilateral upper extremity supported (Hands on rolling walker) Static Standing - Level of Assistance: 4: Min assist Static Standing - Comment/# of Minutes: 5 min     Previous Home Environment Living Arrangements: Spouse/significant other Lives With: Spouse Available Help at Discharge: Family;Available 24 hours/day Type of Home: House Home Layout: Two level;Bed/bath upstairs Alternate Level Stairs-Rails: Right Alternate Level Stairs-Number of Steps: 13 Home Access: Stairs to enter Entrance Stairs-Rails: Right Entrance Stairs-Number of Steps: 2 Bathroom Shower/Tub: Engineer, manufacturing systems: Standard Home Care Services: No  Discharge Living Setting Plans for Discharge Living Setting: Patient's home;Lives with (comment) (47 year old wife) Type of Home at Discharge: Apartment Discharge Home Layout:  (spilt level apartment) Discharge Home Access: Stairs to enter Entrance Stairs-Number of Steps:  2 steps Discharge Bathroom Shower/Tub: Tub/shower unit Discharge Bathroom Toilet: Standard Discharge Bathroom Accessibility: Yes How Accessible: Accessible via walker Do you have any problems obtaining your medications?: No  Social/Family/Support Systems Patient Roles: Spouse;Parent Contact Information: Jonanthan Bolender, 49 year old second wife Anticipated Caregiver: wife Anticipated Caregiver's Contact Information: home [phone]; cell [phone] Ability/Limitations of Caregiver: supervision to min assist Caregiver Availability: 24/7 Discharge Plan Discussed with Primary Caregiver: Yes Is Caregiver In Agreement with Plan?: Yes Does Caregiver/Family have Issues with  Lodging/Transportation while Pt is in Rehab?: No  Goals/Additional Needs Patient/Family Goal for Rehab: Mod I to supervision PT, supervision to min assist OT Expected length of stay: ELOS 7- 10 days Dietary Needs: renal diet Special Service Needs: new to outptatient dialysis MWFR second shift [employer] dialysis Additional Information: Patient's son is a Adult nurse in [place] and is very encouraging to patient which helps alot Pt/Family Agrees to Admission and willing to participate: Yes Program Orientation Provided & Reviewed with Pt/Caregiver Including Roles  & Responsibilities: Yes His goal is to be home by Thanksgiving for his daughter and son coming into town and they have reservations at [employer] for Thanksgiving day.  Patient Condition: This patient's condition remains as documented in the Consult dated 02/10/2012, in which the Rehabilitation Physician determined and documented that the patient's condition is appropriate for intensive rehabilitative care in an inpatient rehabilitation facility.  Preadmission Screen Completed By:  Clois Dupes, 02/11/2012 11:05 AM ______________________________________________________________________   Discussed status with Dr. Riley Kill on 02/11/2012 at  1105 and received telephone approval for admission today.  Admission Coordinator:  Clois Dupes, time 5284 Date 02/11/2012.

## 2012-02-11 NOTE — Progress Notes (Signed)
Subjective:  No cos, " I want to go to rehab in hospital"   Awake, alert, friendly Objective Vital signs in last 24 hours: Filed Vitals:   02/10/12 1215 02/10/12 1704 02/10/12 2144 02/11/12 0451  BP: 122/70 114/48 112/65 109/61  Pulse: 91 89 93 98  Temp: 98 F (36.7 C) 98.5 F (36.9 C) 98.7 F (37.1 C) 98 F (36.7 C)  TempSrc: Oral  Oral Oral  Resp: 18 18 17 18   Height:      Weight:   72.5 kg (159 lb 13.3 oz)   SpO2: 96% 94% 95% 93%   Weight change: -3.4 kg (-7 lb 7.9 oz)  Intake/Output Summary (Last 24 hours) at 02/11/12 0843 Last data filed at 02/11/12 0302  Gross per 24 hour  Intake     60 ml  Output   4103 ml  Net  -4043 ml   Labs: Basic Metabolic Panel:  Lab 02/10/12 1478 02/09/12 1359 02/08/12 0625 02/07/12 0435  NA 139 136 139 --  K 3.8 3.7 3.6 --  CL 103 103 102 --  CO2 25 23 25  --  GLUCOSE 123* 133* 109* --  BUN 23 30* 20 --  CREATININE 6.27* 7.22* 5.52* --  CALCIUM 7.8* 7.4* 7.4* --  ALB -- -- -- --  PHOS 2.9 3.3 -- 4.4   Liver Function Tests:  Lab 02/10/12 0731 02/09/12 1359 02/07/12 0435  AST -- -- --  ALT -- -- --  ALKPHOS -- -- --  BILITOT -- -- --  PROT -- -- --  ALBUMIN 2.4* 2.4* 2.5*   No results found for this basename: LIPASE:3,AMYLASE:3 in the last 168 hours No results found for this basename: AMMONIA:3 in the last 168 hours CBC:  Lab 02/10/12 0731 02/09/12 0613 02/08/12 0625 02/07/12 0435 02/06/12 0815  WBC 13.0* 13.7* 10.0 -- --  NEUTROABS -- -- -- -- --  HGB 8.7* 8.5* 9.0* -- --  HCT 28.1* 27.7* 28.2* -- --  MCV 92.4 93.0 92.2 91.6 93.2  PLT 82* 73* 69* -- --   Cardiac Enzymes:  Lab 02/04/12 1633  CKTOTAL 64  CKMB --  CKMBINDEX --  TROPONINI --   CBG: No results found for this basename: GLUCAP:5 in the last 168 hours  Iron Studies: No results found for this basename: IRON,TIBC,TRANSFERRIN,FERRITIN in the last 72 hours Studies/Results: Dg Chest 2 View  02/10/2012  *RADIOLOGY REPORT*  Clinical Data: Status post  dialysis.  History of CHF  CHEST - 2 VIEW  Comparison: 02/08/2012  Findings: A dialysis catheter is again noted on the right. Bilateral pleural effusions left greater than right are again seen. The overall appearance has improved although this may be positional in nature. No focal confluent infiltrate is seen although there is likely left basilar infiltrate/atelectasis.  IMPRESSION: Slight improvement in pleural effusions although this may be positional in nature.   Original Report Authenticated By: Alcide Clever, M.D.    Medications:    . sodium chloride irrigation 3,000 mL (01/28/12 2101)      . darbepoetin  100 mcg Intravenous Q7 days  . metoprolol tartrate  50 mg Oral BID  . multivitamin  1 tablet Oral Daily   I  have reviewed scheduled and prn medications.  Physical Exam: General:Alert,NAD Heart: RRR , no rub or murmur Lungs: CTA bilaterally no rales or rhonchi Abdomen: bs+ =, soft, nomnteder Extremities: Dialysis Access: Bilateral trace pedal edema , pos. Bruit LUA AVF, right ij perm cath  Problem/Plan: 1.  Acute on Chronic  Renal Failure Now Requiring HD= HD yesterday 3813 cc uf with wt 77.8 to 71.9 kg ,CXR yesterday slight improvement in pleural effusion, will plan for HD in Am , Out pt. Schedule is MWF at [facility] 2. Volume overload / Pleural Effusion= As above  Plan hd for am with some trace pedal edema/ bp stable on Metoprolol 50mg  bid 3. Anemia - Aranesp increased to 100/ week, hgb8.7 <8.5 fu in am  No iron 56% TFS 4. Secondary hyperparathyroidism - no binders with phos 2.9, Ca 5. HTN/volume - see 2 6. BPH, sp TURP 01/21/12= Dr. Rod Can , still with foley 7. Thrombocytopenia/ Hematuria= No heparin HD 8. HO CVA 9. CAD/ HO A fib= SP stent, RRR on exam today 10. DECONDITIONING=  For rehab in hospital 11. Myelodysplastic syndrome  Lenny Pastel, PA-C [facility] Beeper [phone] 02/11/2012,8:43 AM  LOS: 21 days  Mr. Jessop says he's going to rehab today.  Dr.  Vernie Ammons has discharged.  Need to know how long foley cath is to stay in before voiding trial.  Dialysis to be done tomorrow, then MWF (outpatient schedule at [facility]).  Continue aranesp and nephrovite.  If BP falls with further fluid removal, then decrease metoprolol.

## 2012-02-11 NOTE — Plan of Care (Signed)
Overall Plan of Care [facility]) Patient Details Name: Tracy [name] MRN: [id number] DOB: 1920/02/12  Diagnosis:  Deconditioning, balance deficits  Primary Diagnosis:    Physical deconditioning Co-morbidities: ESRD, wound, vertigo, urinary retention  Functional Problem List  Patient demonstrates impairments in the following areas: Balance, Endurance and Safety  Basic ADL's: grooming, bathing, dressing and toileting Advanced ADL's: none  Transfers:  bed mobility, bed to chair, toilet, tub/shower and furniture Locomotion:  ambulation, wheelchair mobility and stairs  Additional Impairments:  None  Anticipated Outcomes Item Anticipated Outcome  Eating/Swallowing  ind  Basic self-care  supervision  Tolieting  supervision  Bowel/Bladder  Mod i  Transfers  Mod I; minimal assist to tub shower  Locomotion  Supervision  Communication    Cognition    Pain  Mod i  Safety/Judgment    Other     Therapy Plan: PT Frequency: 1-2 X/day, 60-90 minutes OT Frequency: 1-2 X/day, 60-90 minutes     Team Interventions: Item RN PT OT SLP SW TR Other  Self Care/Advanced ADL Retraining  x x      Neuromuscular Re-Education  x x      Therapeutic Activities  x x      UE/LE Strength Training/ROM  x x      UE/LE Coordination Activities         Visual/Perceptual Remediation/Compensation         DME/Adaptive Equipment Instruction  x x      Therapeutic Exercise  x x      Balance/Vestibular Training  x x      Patient/Family Education  x x      Cognitive Remediation/Compensation         Functional Mobility Training  x x      Ambulation/Gait Training  x       Museum/gallery curator  x       Wheelchair Propulsion/Positioning  x x      Functional Tourist information centre manager Reintegration  x x      Dysphagia/Aspiration Film/video editor         Bladder Management         Bowel Management x        Disease Management/Prevention  x x      Pain  Management x x x      Medication Management x        Skin Care/Wound Management x x x      Splinting/Orthotics         Discharge Planning x x x      Psychosocial Support x x x                         Team Discharge Planning: Destination:  Home Projected Follow-up:  PT, OT and Home Health Projected Equipment Needs:  Environmental consultant, Wheelchair and TBD Patient/family involved in discharge planning:  Yes  MD ELOS: 11 days Medical Rehab Prognosis:  Excellent Assessment: Pt admitted for CIR therapies. The team will be addressing self-care, fxnl mobiltiy, safety, pain , exercise tolerance, family education, balance. Goals are supervision to mod I.

## 2012-02-11 NOTE — Progress Notes (Signed)
Patient arrived at 1400 in wheelchair  Introduced to room. Too tired to watch safety video now. Will ask later   Wife with him

## 2012-02-11 NOTE — Progress Notes (Signed)
I met with patient at bedside and he is in agreement to admission to inpatient acute rehabilitation today. I will contact his wife and son to make them aware that he is in agreement. Plan d/c to CIR today. [phone]

## 2012-02-11 NOTE — Progress Notes (Signed)
Foley care done, bloody urine in bag md aware . desats  Today during therapy . Deep breaths will bring back up Bp low  104/55. Dialysis in am

## 2012-02-11 NOTE — Progress Notes (Signed)
Brief Nutrition Note:  Consult received for diet education.  Chart reviewed. Pt has been seen by outpatient RD at cardiac rehab for diet education last month.  RD to address education needs with pt and wife prior to d/c.  Kendell Bane RD, LDN, CNSC [phone] Pager [phone] After Hours Pager

## 2012-02-11 NOTE — Discharge Summary (Signed)
Physician Discharge Summary  Patient ID: Tracy [name] MRN: [id number] DOB/AGE: [birth year] 78 y.o.  Admit date: 01/21/2012 Discharge date: 02/11/2012  Admission Diagnoses: Benign Prostatic Hypertrophy with Outlet Obstruction gross hematuria ESRD   Discharge Diagnoses:  Principal Problem:  *Anemia associated with acute blood loss Active Problems:  Essential hypertension, benign  Atrial fibrillation  CKD (chronic kidney disease), stage IV  MDS (myelodysplastic syndrome)  Hyperkalemia  Elevated brain natriuretic peptide (BNP) level  Chronic combined systolic and diastolic CHF (congestive heart failure)  Acute-on-chronic renal failure   Discharged Condition: Weak but improved  Hospital Course: His hospital course subsequent to discharge summary dated 01/28/12 consisted of continued catheter drainage of his bladder. His hemoglobin was monitored and he was maintained on Amicar and eventually his bleeding ceased. He underwent a voiding trial but this was unsuccessful felt likely secondary to the presence of clots within the bladder that had formed due to the use of the Amicar. I therefore maintain his Foley catheter and it continued to drain without further need for irrigation. Because the clots that form in the presence of Amicar are extremely tenacious and slow to dissolve he has had continued dissolving clot resulting in blood-tinged urine in his catheter. We discussed a repeat voiding trial but I felt there was a high probability of recurrent clot retention and I have recommended his Foley catheter remained indwelling until his urine clears and then it can be removed for a voiding trial.  During the remainder of his hospitalization, despite not having been placed on any nephrotoxic medication he had significant, acute worsening of his chronic renal insufficiency to the point where nephrology was consulted and he eventually required hemodialysis.He underwent left upper extremity shunt  placement as well as dialysis catheter placement and has been continuing to undergo dialysis. The etiology of his acute worsening renal function remained undetermined.  Throughout his hospitalization he has become weaker and although he has been undergoing PT is felt he would benefit from further inpatient rehabilitation. He has been seen and evaluated and felt to be an excellent candidate who is motivated and therefore will be discharged today to be admitted to inpatient rehabilitation.  Consults: Nephrology Rehabilitation medicine Neurology Hospitalist service  Significant Diagnostic Studies: Dg Chest 2 View  02/10/2012  *RADIOLOGY REPORT*  Clinical Data: Status post dialysis.  History of CHF  CHEST - 2 VIEW  Comparison: 02/08/2012  Findings: A dialysis catheter is again noted on the right. Bilateral pleural effusions left greater than right are again seen. The overall appearance has improved although this may be positional in nature. No focal confluent infiltrate is seen although there is likely left basilar infiltrate/atelectasis.  IMPRESSION: Slight improvement in pleural effusions although this may be positional in nature.   Original Report Authenticated By: Alcide Clever, M.D.      Discharge Exam: Blood pressure 109/61, pulse 98, temperature 98 F (36.7 C), temperature source Oral, resp. rate 18, height 6\' 1"  (1.854 m), weight 72.5 kg (159 lb 13.3 oz), SpO2 93.00%. He is alert and oriented. His abdomen is soft and nontender. He has a Foley catheter indwelling draining old, lysed blood clots.   Disposition: Inpatient rehabilitation  Discharge Orders    Future Appointments: Provider: Department: Dept Phone: Center:   02/15/2012 1:30 PM [facility] Re[facility] CARDIAC REHAB [phone] None   02/16/2012 11:45 AM [name], MD [facility] Main Office [place]) [phone] [facility]   02/17/2012 1:30 PM Mc-Pulmonary Rehab Tammy Sours [facility] CARDIAC  REHAB [phone] None   02/22/2012 1:30 PM [facility] CARDIAC REHAB [phone] None   02/29/2012 1:30 PM Mc-Pulmonary Rehab [facility] CARDIAC REHAB [phone] None   03/02/2012 1:30 PM Mc-Pulmonary Rehab [facility] CARDIAC REHAB [phone] None   03/07/2012 1:30 PM [facility]Pulmonary Rehab [facility] CARDIAC REHAB [phone] None   03/09/2012 1:30 PM [facility]-Pulmonary Rehab [facility] CARDIAC REHAB [phone] None   03/14/2012 1:30 PM Mc-Pulmonary Rehab [facility] CARDIAC REHAB [phone] None   03/15/2012 2:00 PM [facility] [phone] [facility]   03/15/2012 3:15 PM Chuck Hint, MD [facility] [phone] [facility]   03/16/2012 1:30 PM [facility] Rehab [facility] CARDIAC REHAB [phone] None   03/21/2012 1:30 PM Mc-Pulmonary Rehab [facility] CARDIAC REHAB [phone] None   03/23/2012 1:30 PM Mc-Pulmonary Rehab [facility] CARDIAC REHAB [phone] None   03/28/2012 1:30 PM Mc-Pulmonary Rehab [facility] CARDIAC REHAB [phone] None   03/30/2012 11:00 AM [facility] [address] [phone] LB-CT [facility]   03/30/2012 1:30 PM Mc-Pulmonary Rehab [facility] CARDIAC REHAB [phone] None   04/04/2012 1:30 PM Mc-Pulmonary Rehab [facility] CARDIAC REHAB [phone] None   04/06/2012 1:30 PM Mc-Pulmonary Rehab [facility] CARDIAC REHAB [phone] None       Medication List     As of 02/11/2012  9:10 AM    STOP taking these medications         FLOMAX 0.4 MG Caps   Generic drug:  Tamsulosin HCl      TAKE these medications         acetaminophen 500 MG tablet   Commonly known as: TYLENOL   Take 500 mg by mouth every 6 (six) hours as needed. For pain      ARANESP (ALBUMIN FREE) 150 MCG/0.75ML Soln   Generic drug: Darbepoetin Alfa-Polysorbate   Inject 150 mg as directed every 7 (seven) days. Friday.      isosorbide mononitrate 30 MG 24 hr tablet   Commonly known as: IMDUR   Take 30 mg by mouth every evening.      metoprolol 50 MG tablet   Commonly known as: LOPRESSOR   Take 1 tablet (50 mg total) by mouth 2 (two) times daily.      nitrofurantoin 100 MG capsule   Commonly known as: MACRODANTIN   Take 100 mg by mouth 2 (two) times daily after a meal. X 7 DAYS    STARTED Monday 01/17/12   TAKES WITH FOOD OR MILK      nitroGLYCERIN 0.4 MG SL tablet   Commonly known as: NITROSTAT   Place 0.4 mg under the tongue every 5 (five) minutes as needed. For chest pain      traMADol 50 MG tablet   Commonly known as: ULTRAM   Take 1 tablet (50 mg total) by mouth every 6 (six) hours as needed for pain.       He will followup with me as an outpatient once he has completed rehabilitation.  SignedGarnett Farm 02/11/2012, 9:10 AM

## 2012-02-11 NOTE — H&P (Signed)
Physical Medicine and Rehabilitation Admission H&P  CC: Deconditioning due to multiple medical issues  HPI: Tracy [name] is a 78 y.o. male with hx of CKD stage IV (2.5-3.0), HTN, TIA/CVA, L TKR, CAD w stents in place, afib, diast HF and Myelodysplastic syndrome, recent admission 12/10/11 for ABLA due to GIB as well as urethral bleeding. He was admitted on 01/21/12 for TURP (for BPH with outlet obstruction) by Dr. Vernie Ammons. Post-op had onset of gross hematuria treated with Amicar, bladder irrigation and on 11/1 he had cystoscopy with clot evacuation. Hospital course complicated by ABLA requiring multiple units of PRBC and platelets, syncope with dizziness and lethargy due to anemia and electrolyte abnormality. He developed acute on chronic renal failure with uremic symptoms and fluid overload requiring initiation of HD. Left AV fistula and R-IJ cath placed by Dr. Edilia Bo on 02/08/12. He contiues to have hematuria and foley to remain in place till urine clears prior to initiating voiding trial per urology. Therapies initiated and patient noted to be deconditioned with unsteady gait. Therapy team, family, MD requested CIR for progression.  Review of Systems  HENT: Negative for hearing loss.  Eyes: Negative for blurred vision and double vision.  Respiratory: Negative for shortness of breath.  Cardiovascular: Negative for chest pain and palpitations.  Gastrointestinal: Negative for heartburn and nausea.  Genitourinary: Positive for hematuria.  Neurological: Negative for headaches.  Psychiatric/Behavioral: The patient has insomnia.  Past Medical History   Diagnosis  Date   .  CAD (coronary artery disease)      status post prior PCI to the obtuse marginal in 1997 and stenting to the mid to distal RCA in 2001; LHC 9/06: LM ok, pLAD 50%, mLAD 60% then 60-70%, mRI 50%, mOM1 80-90% (small), pRCA and dRCA stents ok, PDA 50%, EF 65%; Myoview 5/12: No ischemia, EF 64%.; NSTEMI 11/12 tx medically   .  Chronic kidney  disease, stage III (moderate)    .  TIA (transient ischemic attack)    .  HTN (hypertension)    .  Hyperlipidemia    .  AF (atrial fibrillation)  02/16/11     Coumadin ==> patient decided to stop after admxn with worsening anemia in setting of UGI bleed, epistaxis   .  Arthritis      "in my left ankle"   .  Chronic diastolic heart failure    .  Carotid stenosis      dopplers 03/2011: 0-39% bilateral   .  MDS (myelodysplastic syndrome)  12/16/2011   .  Hx of echocardiogram      Echocardiogram 02/17/11: Severe LVH, asymmetric hypertrophy, EF 50-55%, normal wall motion, high ventricular filling pressure, mild LAE, mild RVE, mildly reduced RVSF, mild RAE.   Marland Kitchen  Myocardial infarction  2012   .  Stroke      5 yrs ago. Mini - NO RESIDUAL PROBLEMS   .  Anemia of chronic disease      RECENT HOSPITALIZATION / BLOOD TRANSFUSION OCT 2013   .  Shortness of breath      WITH ANY ACTIVITY--- NO OXYGEN   .  CHF (congestive heart failure)      CHRONIC DIASTOLIC HEART FAILURE    Past Surgical History   Procedure  Date   .  Nose surgery  1986     deviated septum   .  Total knee arthroplasty  2009     left   .  Joint replacement  2009     left knee   .  Surgery scrotal / testicular  197O     CORRECTIVE SURGERY TO VARICOCELE (SWELLING OF SCROTUM)   .  Esophagogastroduodenoscopy  12/09/2011     Procedure: ESOPHAGOGASTRODUODENOSCOPY (EGD); Surgeon: Willis Modena, MD; Location: [facility] ENDOSCOPY; Service: Endoscopy; Laterality: N/A; outlaw/ebp   .  Tonsillectomy and adenoidectomy  1929   .  Appendectomy  1930's   .  Left ear mastoid surgery  1933   .  Removal of one testicle  1944   .  Correction to botched varicocele surgery  1970   .  Eye surgery  1993     SURGERY FOR DETACHED RETINA LEFT EYE   .  Left cataract extraction with lens implant  1993   .  Coronary angioplasty with stent placement      3 procedures; 3 stents total 1994 & 1996   .  Right cataract extraction with lens implant  1997   .   Transurethral resection of prostate  01/21/2012     Procedure: TRANSURETHRAL RESECTION OF THE PROSTATE WITH GYRUS INSTRUMENTS; Surgeon: [name], MD; Location: [facility] ORS; Service: Urology; Laterality: N/A;   .  Cystoscopy  01/28/2012     Procedure: CYSTOSCOPY; Surgeon: [name], MD; Location: [facility] ORS; Service: Urology; Laterality: N/A; cystoscopy   .  Hematoma evacuation  01/28/2012     Procedure: EVACUATION HEMATOMA; Surgeon: [name], MD; Location: [facility] ORS; Service: Urology; Laterality: N/A; with fulgeration evacuation of clot   .  Av fistula placement  02/08/2012     Procedure: ARTERIOVENOUS (AV) FISTULA CREATION; Surgeon: Chuck Hint, MD; Location: [facility] OR; Service: Vascular; Laterality: Left;   .  Insertion of dialysis catheter  02/08/2012     Procedure: INSERTION OF DIALYSIS CATHETER; Surgeon: Chuck Hint, MD; Location: [facility] OR; Service: Vascular; Laterality: Right; Diatek exchange Right Internal Jugular   History reviewed. No pertinent family history.  Social History: Married. Retired Radiation protection practitioner. Used cane out of home setting due to unsteady gait. He reports that he quit smoking about 33 years ago. His smoking use included Cigarettes and Pipe. He has a 80 pack-year smoking history. He has never used smokeless tobacco. He reports that he drinks alcohol- beer once a week and wine/mixed drink once a month. He reports that he does not use illicit drugs.  Allergies   Allergen  Reactions   .  Oxycodone Hcl  Other (See Comments)     Wanted to climb the wall   .  Prednisone  Other (See Comments)     Gain weight   Scheduled Meds:  .  darbepoetin  100 mcg  Intravenous  Q7 days   .  metoprolol tartrate  50 mg  Oral  BID   .  multivitamin  1 tablet  Oral  Daily    Medications Prior to Admission   Medication  Sig  Dispense  Refill   .  acetaminophen (TYLENOL) 500 MG tablet  Take 500 mg by mouth every 6 (six) hours as needed. For pain     .  metoprolol (LOPRESSOR) 50 MG  tablet  Take 1 tablet (50 mg total) by mouth 2 (two) times daily.  60 tablet  11   .  nitrofurantoin (MACRODANTIN) 100 MG capsule  Take 100 mg by mouth 2 (two) times daily after a meal. X 7 DAYS STARTED Monday 01/17/12 TAKES WITH FOOD OR MILK     .  nitroGLYCERIN (NITROSTAT) 0.4 MG SL tablet  Place 0.4 mg under the tongue every 5 (five)  minutes as needed. For chest pain     .  [DISCONTINUED] Tamsulosin HCl (FLOMAX) 0.4 MG CAPS  Take 0.8 mg by mouth at bedtime. Take 2 tabs daily     .  Darbepoetin Alfa-Polysorbate (ARANESP, ALBUMIN FREE,) 150 MCG/0.75ML SOLN  Inject 150 mg as directed every 7 (seven) days. Friday.     .  isosorbide mononitrate (IMDUR) 30 MG 24 hr tablet  Take 30 mg by mouth every evening.     Home:  Home Living  Lives With: Spouse  Available Help at Discharge: Family;Available 24 hours/day  Type of Home: House  Home Access: Stairs to enter  [employer] of Steps: 2  Entrance Stairs-Rails: Right  Home Layout: Two level;Bed/bath upstairs  Alternate Level Stairs-Number of Steps: 13  Alternate Level Stairs-Rails: Right  Bathroom Shower/Tub: Medical sales representative: Standard  Home Adaptive Equipment: Straight cane;Tub transfer bench  Functional History:  Prior Function  Able to Take Stairs?: Yes (pt has to stop to rest to get to 2nd floor, wants stair lift)  Driving: Yes  Vocation: (retired Radiation protection practitioner)  Comments: developed Civil engineer, 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  Functional Status:  Mobility:  Bed Mobility  Bed Mobility: Supine to Sit;Sit to Supine;Sitting - Scoot to Edge of Bed  Rolling Left: 4: Min guard  Left Sidelying to Sit: 4: Min assist;With rails;HOB flat  Supine to Sit: 4: Min guard;With rails;HOB elevated (without physical contact; HOB slightly elevated)  Sitting - Scoot to Edge of Bed: 4: Min guard (without physical contact)  Sit to Supine: 4: Min guard;With rail;HOB elevated (without physical  contact; HOB slightly elevated)  Scooting to HOB: 1: +2 Total assist (Pt pushed w/ feet; BUE support at elbow)  Scooting to [facility]: Patient Percentage: 30%  Transfers  Transfers: Sit to Stand;Stand to Sit;Stand Pivot Transfers  Sit to Stand: 3: Mod assist;With upper extremity assist;From bed (light mod assist)  Stand to Sit: 4: Min assist;With upper extremity assist;To chair/3-in-1  Stand Pivot Transfers: 3: Mod assist  Ambulation/Gait  Ambulation/Gait Assistance: 1: +2 Total assist  Ambulation/Gait: Patient Percentage: 70%  Ambulation Distance (Feet): 20 Feet (with two seated rest breaks)  Assistive device: Rolling walker  Ambulation/Gait Assistance Details: Assisted with walker management; second person pushing recliner very close for safety;cued for safety and to self monitor for activity tolerance; trunk flexed posture and decreased step height; became dyspneic with exertion and required two seated rest breaks (lasting 3-4 min); pt very motivated even when offered to stop walking he chose to continue with one more bout of walking to make it to the door  Gait Pattern: Decreased stride length;Trunk flexed  Gait velocity: decreased and very unsteady  General Gait Details: no LOB, pt took 3 standing rest breaks due to 2/4 dyspnea which he stated is normal for him since having heart attack last year  Stairs: No  ADL:  ADL  Eating/Feeding: Performed;Set up  Where Assessed - Eating/Feeding: Edge of bed  Grooming: Performed;Wash/dry face;Brushing hair  Where Assessed - Grooming: Unsupported sitting  Upper Body Bathing: Simulated;Minimal assistance  Where Assessed - Upper Body Bathing: Unsupported sitting  Lower Body Bathing: Simulated;Moderate assistance  Where Assessed - Lower Body Bathing: Supported sit to stand  Upper Body Dressing: Simulated;Minimal assistance  Where Assessed - Upper Body Dressing: Unsupported sitting  Lower Body Dressing: Simulated;Moderate assistance  Where Assessed -  Lower Body Dressing: Supported sit to Scientist, research (life sciences): Corporate investment banker  Method: Stand pivot  Acupuncturist: (bed to chair)  Tub/Shower Transfer Method: Not assessed  Transfers/Ambulation Related to ADLs: Once pt sat up, he became nauseated. Did not feel up to standing at sink after this. Transferred to recliner.  ADL Comments: Pt did not feel up to bathing today, although he reports that he was sweaty last night. therapist did wash back. Pt needs extra time for all activities. Dyspnea 2/4, on 02  Cognition:  Cognition  Arousal/Alertness: Awake/alert  Orientation Level: Oriented X4 (Simultaneous filing. User may not have seen previous data.)  Cognition  Overall Cognitive Status: Appears within functional limits for tasks assessed/performed  Arousal/Alertness: Awake/alert  Orientation Level: Appears intact for tasks assessed  Behavior During Session: [facility] for tasks performed  Cognition - Other Comments: Seems somewhat unconcerned about dizziness and felt as through he should still walk down the hall  Blood pressure 105/56, pulse 95, temperature 98.4 F (36.9 C), temperature source Oral, resp. rate 18, height 6\' 1"  (1.854 m), weight 72.5 kg (159 lb 13.3 oz), SpO2 92.00%.  Physical Exam  Nursing note and vitals reviewed.  Constitutional: He is oriented to person, place, and time.  Frail, cantankerous elderly male,  HENT:  Head: Normocephalic and atraumatic.  Crusting on teeth. Poor dentition over all  Eyes: Pupils are equal, round, and reactive to light. Left conjunctiva has a hemorrhage.  Neck: Normal range of motion.  Cardiovascular: Normal rate. An irregular rhythm present. Edema LUE 1+, trace to 1+ in legs  Pulmonary/Chest: Effort normal. He has decreased breath sounds in the right lower field and the left lower field.  Abdominal: Soft. Bowel sounds are normal.  Genitourinary:  Crusting of dry blood around meatus and foley tubing. Dark  red urine in foley bag. But urine is flowing freely into bag. Musculoskeletal: He exhibits no edema.  Resolving ecchymosis on left shin. Left upper extremity is bruised, swollen, tender at elbow crease. Neurological: He is alert and oriented to person, place, and time. Follows all basic commands. LUE limited by pain. RUE is 4/5. BLE are grossly 3-4/5. Sensory exam grossly intact  Skin: Skin is warm and dry.  Results for orders placed during the hospital encounter of 01/21/12 (from the past 48 hour(s))   RENAL FUNCTION PANEL Status: Abnormal    Collection Time    02/09/12 1:59 PM   Component  Value  Range  Comment    Sodium  136  135 - 145 mEq/L     Potassium  3.7  3.5 - 5.1 mEq/L     Chloride  103  96 - 112 mEq/L     CO2  23  19 - 32 mEq/L     Glucose, Bld  133 (*)  70 - 99 mg/dL     BUN  30 (*)  6 - 23 mg/dL     Creatinine, Ser  7.84 (*)  0.50 - 1.35 mg/dL     Calcium  7.4 (*)  8.4 - 10.5 mg/dL     Phosphorus  3.3  2.3 - 4.6 mg/dL     Albumin  2.4 (*)  3.5 - 5.2 g/dL     GFR calc non Af Amer  6 (*)  >90 mL/min     GFR calc Af Amer  7 (*)  >90 mL/min    CBC Status: Abnormal    Collection Time    02/10/12 7:31 AM   Component  Value  Range  Comment    WBC  13.0 (*)  4.0 -  10.5 K/uL     RBC  3.04 (*)  4.22 - 5.81 MIL/uL     Hemoglobin  8.7 (*)  13.0 - 17.0 g/dL     HCT  16.1 (*)  09.6 - 52.0 %     MCV  92.4  78.0 - 100.0 fL     MCH  28.6  26.0 - 34.0 pg     MCHC  31.0  30.0 - 36.0 g/dL     RDW  04.5 (*)  40.9 - 15.5 %     Platelets  82 (*)  150 - 400 K/uL  CONSISTENT WITH PREVIOUS RESULT   RENAL FUNCTION PANEL Status: Abnormal    Collection Time    02/10/12 7:31 AM   Component  Value  Range  Comment    Sodium  139  135 - 145 mEq/L     Potassium  3.8  3.5 - 5.1 mEq/L     Chloride  103  96 - 112 mEq/L     CO2  25  19 - 32 mEq/L     Glucose, Bld  123 (*)  70 - 99 mg/dL     BUN  23  6 - 23 mg/dL     Creatinine, Ser  8.11 (*)  0.50 - 1.35 mg/dL     Calcium  7.8 (*)  8.4 - 10.5  mg/dL     Phosphorus  2.9  2.3 - 4.6 mg/dL     Albumin  2.4 (*)  3.5 - 5.2 g/dL     GFR calc non Af Amer  7 (*)  >90 mL/min     GFR calc Af Amer  8 (*)  >90 mL/min    Dg Chest 2 View  02/10/2012 *RADIOLOGY REPORT* Clinical Data: Status post dialysis. History of CHF CHEST - 2 VIEW Comparison: 02/08/2012 Findings: A dialysis catheter is again noted on the right. Bilateral pleural effusions left greater than right are again seen. The overall appearance has improved although this may be positional in nature. No focal confluent infiltrate is seen although there is likely left basilar infiltrate/atelectasis. IMPRESSION: Slight improvement in pleural effusions although this may be positional in nature. Original Report Authenticated By: Alcide Clever, M.D.  Post Admission Physician Evaluation:  1. Functional deficits secondary to deconditioning after TURP, acute renal failure, multiple medical issues. 2. Patient is admitted to receive collaborative, interdisciplinary care between the physiatrist, rehab nursing staff, and therapy team. 3. Patient's level of medical complexity and substantial therapy needs in context of that medical necessity cannot be provided at a lesser intensity of care such as a SNF. 4. Patient has experienced substantial functional loss from his/her baseline which was documented above under the "Functional History" and "Functional Status" headings. Judging by the patient's diagnosis, physical exam, and functional history, the patient has potential for functional progress which will result in measurable gains while on inpatient rehab. These gains will be of substantial and practical use upon discharge in facilitating mobility and self-care at the household level. 5. Physiatrist will provide 24 hour management of medical needs as well as oversight of the therapy plan/treatment and provide guidance as appropriate regarding the interaction of the two. 6. 24 hour rehab nursing will assist with  bladder management, bowel management, safety, skin/wound care, disease management, medication administration, pain management and patient education and help integrate therapy concepts, techniques,education, etc. 7. PT will assess and treat for: fxnl mobility, strength, balance, safety, adaptive equipment. Goals are: supervision to min assist. 8. OT will  assess and treat for: UES, ROM, ADL's, safety, fxnl mobility,adaptive equipment. Goals are: supervision to min assist. 9. SLP will assess and treat for: n/a. Goals are: n/a. 10. Case Management and Social Worker will assess and treat for psychological issues and discharge planning. 11. Team conference will be held weekly to assess progress toward goals and to determine barriers to discharge. 12. Patient will receive at least 3 hours of therapy per day at least 5 days per week. 13. ELOS: 10 days Prognosis: excellent Medical Problem List and Plan:  1. DVT Prophylaxis/Anticoagulation: Mechanical: Sequential compression devices, below knee Bilateral lower extremities  2. Pain Management: prn hydrocodone effective.  3. Mood: tired of being in the hospital and now diet restrictions. LCSW to follow up for formal evaluation. Offer ego support.  4. Neuropsych: This patient is capable of making decisions on his/her own behalf.  5. Acute on chronic renal failure: Now HD dependent. Continue HD T, T, S. Daily weights. Renal diet.  6. Atrial fibrillation: Monitor HR with bid checks. Continue metoprolol. Off coumadin due to recent bleeding events.  7. Mixed CHF with pleural effusion: Appears to be compensated on dialysis and FR. Continue daily weights.  8. Acute on chronic anemia: On aranesp weekly. Continue to monitor H/H with HD. Has been relatively stable despite ongoing urethral bleeding.  9. MDS: likely contributing to thrombocytopenia. Platelets on slow upward trend 58---> 82.  10. BPH s/p TURP: Foley to continue due to continue hematuria. Wife is concerned,  but encouraged her that this will be monitored closely along with hgb. Flush catheter as needed to ensure patency.  Ivory Broad, MD

## 2012-02-11 NOTE — Progress Notes (Signed)
Physical Medicine and Rehabilitation Admission H&P  CC: Deconditioning due to multiple medical issues  HPI: Tracy [name] is a 78 y.o. male with hx of CKD stage IV (2.5-3.0), HTN, TIA/CVA, L TKR, CAD w stents in place, afib, diast HF and Myelodysplastic syndrome, recent admission 12/10/11 for ABLA due to GIB as well as urethral bleeding. He was admitted on 01/21/12 for TURP (for BPH with outlet obstruction) by Dr. Ottelin. Post-op had onset of gross hematuria treated with Amicar, bladder irrigation and on 11/1 he had cystoscopy with clot evacuation. Hospital course complicated by ABLA requiring multiple units of PRBC and platelets, syncope with dizziness and lethargy due to anemia and electrolyte abnormality. He developed acute on chronic renal failure with uremic symptoms and fluid overload requiring initiation of HD. Left AV fistula and R-IJ cath placed by Dr. Dickson on 02/08/12. He contiues to have hematuria and foley to remain in place till urine clears prior to initiating voiding trial per urology. Therapies initiated and patient noted to be deconditioned with unsteady gait. Therapy team, family, MD requested CIR for progression.  Review of Systems  HENT: Negative for hearing loss.  Eyes: Negative for blurred vision and double vision.  Respiratory: Negative for shortness of breath.  Cardiovascular: Negative for chest pain and palpitations.  Gastrointestinal: Negative for heartburn and nausea.  Genitourinary: Positive for hematuria.  Neurological: Negative for headaches.  Psychiatric/Behavioral: The patient has insomnia.  Past Medical History   Diagnosis  Date   .  CAD (coronary artery disease)      status post prior PCI to the obtuse marginal in 1997 and stenting to the mid to distal RCA in 2001; LHC 9/06: LM ok, pLAD 50%, mLAD 60% then 60-70%, mRI 50%, mOM1 80-90% (small), pRCA and dRCA stents ok, PDA 50%, EF 65%; Myoview 5/12: No ischemia, EF 64%.; NSTEMI 11/12 tx medically   .  Chronic kidney  disease, stage III (moderate)    .  TIA (transient ischemic attack)    .  HTN (hypertension)    .  Hyperlipidemia    .  AF (atrial fibrillation)  02/16/11     Coumadin ==> patient decided to stop after admxn with worsening anemia in setting of UGI bleed, epistaxis   .  Arthritis      "in my left ankle"   .  Chronic diastolic heart failure    .  Carotid stenosis      dopplers 03/2011: 0-39% bilateral   .  MDS (myelodysplastic syndrome)  12/16/2011   .  Hx of echocardiogram      Echocardiogram 02/17/11: Severe LVH, asymmetric hypertrophy, EF 50-55%, normal wall motion, high ventricular filling pressure, mild LAE, mild RVE, mildly reduced RVSF, mild RAE.   .  Myocardial infarction  2012   .  Stroke      5 yrs ago. Mini - NO RESIDUAL PROBLEMS   .  Anemia of chronic disease      RECENT HOSPITALIZATION / BLOOD TRANSFUSION OCT 2013   .  Shortness of breath      WITH ANY ACTIVITY--- NO OXYGEN   .  CHF (congestive heart failure)      CHRONIC DIASTOLIC HEART FAILURE    Past Surgical History   Procedure  Date   .  Nose surgery  1986     deviated septum   .  Total knee arthroplasty  2009     left   .  Joint replacement  2009     left knee   .    Surgery scrotal / testicular  197O     CORRECTIVE SURGERY TO VARICOCELE (SWELLING OF SCROTUM)   .  Esophagogastroduodenoscopy  12/09/2011     Procedure: ESOPHAGOGASTRODUODENOSCOPY (EGD); Surgeon: William Outlaw, MD; Location: [facility]; Service: Endoscopy; Laterality: N/A; outlaw/ebp   .  Tonsillectomy and adenoidectomy  1929   .  Appendectomy  1930's   .  Left ear mastoid surgery  1933   .  Removal of one testicle  1944   .  Correction to botched varicocele surgery  1970   .  Eye surgery  1993     SURGERY FOR DETACHED RETINA LEFT EYE   .  Left cataract extraction with lens implant  1993   .  Coronary angioplasty with stent placement      3 procedures; 3 stents total 1994 & 1996   .  Right cataract extraction with lens implant  1997   .   Transurethral resection of prostate  01/21/2012     Procedure: TRANSURETHRAL RESECTION OF THE PROSTATE WITH GYRUS INSTRUMENTS; Surgeon: Mark C Ottelin, MD; Location: [facility]; Service: Urology; Laterality: N/A;   .  Cystoscopy  01/28/2012     Procedure: CYSTOSCOPY; Surgeon: Mark C Ottelin, MD; Location: [facility]; Service: Urology; Laterality: N/A; cystoscopy   .  Hematoma evacuation  01/28/2012     Procedure: EVACUATION HEMATOMA; Surgeon: Mark C Ottelin, MD; Location: [facility]; Service: Urology; Laterality: N/A; with fulgeration evacuation of clot   .  Av fistula placement  02/08/2012     Procedure: ARTERIOVENOUS (AV) FISTULA CREATION; Surgeon: Christopher S Dickson, MD; Location: [facility]; Service: Vascular; Laterality: Left;   .  Insertion of dialysis catheter  02/08/2012     Procedure: INSERTION OF DIALYSIS CATHETER; Surgeon: Christopher S Dickson, MD; Location: [facility]; Service: Vascular; Laterality: Right; Diatek exchange Right Internal Jugular   History reviewed. No pertinent family history.  Social History: Married. Retired master chef. Used cane out of home setting due to unsteady gait. He reports that he quit smoking about 33 years ago. His smoking use included Cigarettes and Pipe. He has a 80 pack-year smoking history. He has never used smokeless tobacco. He reports that he drinks alcohol- beer once a week and wine/mixed drink once a month. He reports that he does not use illicit drugs.  Allergies   Allergen  Reactions   .  Oxycodone Hcl  Other (See Comments)     Wanted to climb the wall   .  Prednisone  Other (See Comments)     Gain weight   Scheduled Meds:  .  darbepoetin  100 mcg  Intravenous  Q7 days   .  metoprolol tartrate  50 mg  Oral  BID   .  multivitamin  1 tablet  Oral  Daily    Medications Prior to Admission   Medication  Sig  Dispense  Refill   .  acetaminophen (TYLENOL) 500 MG tablet  Take 500 mg by mouth every 6 (six) hours as needed. For pain     .  metoprolol (LOPRESSOR) 50 MG  tablet  Take 1 tablet (50 mg total) by mouth 2 (two) times daily.  60 tablet  11   .  nitrofurantoin (MACRODANTIN) 100 MG capsule  Take 100 mg by mouth 2 (two) times daily after a meal. X 7 DAYS STARTED Monday 01/17/12 TAKES WITH FOOD OR MILK     .  nitroGLYCERIN (NITROSTAT) 0.4 MG SL tablet  Place 0.4 mg under the tongue every 5 (five)   minutes as needed. For chest pain     .  [DISCONTINUED] Tamsulosin HCl (FLOMAX) 0.4 MG CAPS  Take 0.8 mg by mouth at bedtime. Take 2 tabs daily     .  Darbepoetin Alfa-Polysorbate (ARANESP, ALBUMIN FREE,) 150 MCG/0.75ML SOLN  Inject 150 mg as directed every 7 (seven) days. Friday.     .  isosorbide mononitrate (IMDUR) 30 MG 24 hr tablet  Take 30 mg by mouth every evening.     Home:  Home Living  Lives With: Spouse  Available Help at Discharge: Family;Available 24 hours/day  Type of Home: House  Home Access: Stairs to enter  Entrance Stairs-Number of Steps: 2  Entrance Stairs-Rails: Right  Home Layout: Two level;Bed/bath upstairs  Alternate Level Stairs-Number of Steps: 13  Alternate Level Stairs-Rails: Right  Bathroom Shower/Tub: Tub/shower unit  Bathroom Toilet: Standard  Home Adaptive Equipment: Straight cane;Tub transfer bench  Functional History:  Prior Function  Able to Take Stairs?: Yes (pt has to stop to rest to get to 2nd floor, wants stair lift)  Driving: Yes  Vocation: (retired master chef)  Comments: 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  Functional Status:  Mobility:  Bed Mobility  Bed Mobility: Supine to Sit;Sit to Supine;Sitting - Scoot to Edge of Bed  Rolling Left: 4: Min guard  Left Sidelying to Sit: 4: Min assist;With rails;HOB flat  Supine to Sit: 4: Min guard;With rails;HOB elevated (without physical contact; HOB slightly elevated)  Sitting - Scoot to Edge of Bed: 4: Min guard (without physical contact)  Sit to Supine: 4: Min guard;With rail;HOB elevated (without physical  contact; HOB slightly elevated)  Scooting to HOB: 1: +2 Total assist (Pt pushed w/ feet; BUE support at elbow)  Scooting to HOB: Patient Percentage: 30%  Transfers  Transfers: Sit to Stand;Stand to Sit;Stand Pivot Transfers  Sit to Stand: 3: Mod assist;With upper extremity assist;From bed (light mod assist)  Stand to Sit: 4: Min assist;With upper extremity assist;To chair/3-in-1  Stand Pivot Transfers: 3: Mod assist  Ambulation/Gait  Ambulation/Gait Assistance: 1: +2 Total assist  Ambulation/Gait: Patient Percentage: 70%  Ambulation Distance (Feet): 20 Feet (with two seated rest breaks)  Assistive device: Rolling walker  Ambulation/Gait Assistance Details: Assisted with walker management; second person pushing recliner very close for safety;cued for safety and to self monitor for activity tolerance; trunk flexed posture and decreased step height; became dyspneic with exertion and required two seated rest breaks (lasting 3-4 min); pt very motivated even when offered to stop walking he chose to continue with one more bout of walking to make it to the door  Gait Pattern: Decreased stride length;Trunk flexed  Gait velocity: decreased and very unsteady  General Gait Details: no LOB, pt took 3 standing rest breaks due to 2/4 dyspnea which he stated is normal for him since having heart attack last year  Stairs: No  ADL:  ADL  Eating/Feeding: Performed;Set up  Where Assessed - Eating/Feeding: Edge of bed  Grooming: Performed;Wash/dry face;Brushing hair  Where Assessed - Grooming: Unsupported sitting  Upper Body Bathing: Simulated;Minimal assistance  Where Assessed - Upper Body Bathing: Unsupported sitting  Lower Body Bathing: Simulated;Moderate assistance  Where Assessed - Lower Body Bathing: Supported sit to stand  Upper Body Dressing: Simulated;Minimal assistance  Where Assessed - Upper Body Dressing: Unsupported sitting  Lower Body Dressing: Simulated;Moderate assistance  Where Assessed -  Lower Body Dressing: Supported sit to stand  Toilet Transfer: Simulated;Minimal assistance  Toilet Transfer   Method: Stand pivot  Toilet Transfer Equipment: (bed to chair)  Tub/Shower Transfer Method: Not assessed  Transfers/Ambulation Related to ADLs: Once pt sat up, he became nauseated. Did not feel up to standing at sink after this. Transferred to recliner.  ADL Comments: Pt did not feel up to bathing today, although he reports that he was sweaty last night. therapist did wash back. Pt needs extra time for all activities. Dyspnea 2/4, on 02  Cognition:  Cognition  Arousal/Alertness: Awake/alert  Orientation Level: Oriented X4 (Simultaneous filing. User may not have seen previous data.)  Cognition  Overall Cognitive Status: Appears within functional limits for tasks assessed/performed  Arousal/Alertness: Awake/alert  Orientation Level: Appears intact for tasks assessed  Behavior During Session: WFL for tasks performed  Cognition - Other Comments: Seems somewhat unconcerned about dizziness and felt as through he should still walk down the hall  Blood pressure 105/56, pulse 95, temperature 98.4 F (36.9 C), temperature source Oral, resp. rate 18, height 6' 1" (1.854 m), weight 72.5 kg (159 lb 13.3 oz), SpO2 92.00%.  Physical Exam  Nursing note and vitals reviewed.  Constitutional: He is oriented to person, place, and time.  Frail, cantankerous elderly male,  HENT:  Head: Normocephalic and atraumatic.  Crusting on teeth. Poor dentition over all  Eyes: Pupils are equal, round, and reactive to light. Left conjunctiva has a hemorrhage.  Neck: Normal range of motion.  Cardiovascular: Normal rate. An irregular rhythm present. Edema LUE 1+, trace to 1+ in legs  Pulmonary/Chest: Effort normal. He has decreased breath sounds in the right lower field and the left lower field.  Abdominal: Soft. Bowel sounds are normal.  Genitourinary:  Crusting of dry blood around meatus and foley tubing. Dark  red urine in foley bag. But urine is flowing freely into bag. Musculoskeletal: He exhibits no edema.  Resolving ecchymosis on left shin. Left upper extremity is bruised, swollen, tender at elbow crease. Neurological: He is alert and oriented to person, place, and time. Follows all basic commands. LUE limited by pain. RUE is 4/5. BLE are grossly 3-4/5. Sensory exam grossly intact  Skin: Skin is warm and dry.  Results for orders placed during the hospital encounter of 01/21/12 (from the past 48 hour(s))   RENAL FUNCTION PANEL Status: Abnormal    Collection Time    02/09/12 1:59 PM   Component  Value  Range  Comment    Sodium  136  135 - 145 mEq/L     Potassium  3.7  3.5 - 5.1 mEq/L     Chloride  103  96 - 112 mEq/L     CO2  23  19 - 32 mEq/L     Glucose, Bld  133 (*)  70 - 99 mg/dL     BUN  30 (*)  6 - 23 mg/dL     Creatinine, Ser  7.22 (*)  0.50 - 1.35 mg/dL     Calcium  7.4 (*)  8.4 - 10.5 mg/dL     Phosphorus  3.3  2.3 - 4.6 mg/dL     Albumin  2.4 (*)  3.5 - 5.2 g/dL     GFR calc non Af Amer  6 (*)  >90 mL/min     GFR calc Af Amer  7 (*)  >90 mL/min    CBC Status: Abnormal    Collection Time    02/10/12 7:31 AM   Component  Value  Range  Comment    WBC  13.0 (*)  4.0 -   10.5 K/uL     RBC  3.04 (*)  4.22 - 5.81 MIL/uL     Hemoglobin  8.7 (*)  13.0 - 17.0 g/dL     HCT  28.1 (*)  39.0 - 52.0 %     MCV  92.4  78.0 - 100.0 fL     MCH  28.6  26.0 - 34.0 pg     MCHC  31.0  30.0 - 36.0 g/dL     RDW  17.8 (*)  11.5 - 15.5 %     Platelets  82 (*)  150 - 400 K/uL  CONSISTENT WITH PREVIOUS RESULT   RENAL FUNCTION PANEL Status: Abnormal    Collection Time    02/10/12 7:31 AM   Component  Value  Range  Comment    Sodium  139  135 - 145 mEq/L     Potassium  3.8  3.5 - 5.1 mEq/L     Chloride  103  96 - 112 mEq/L     CO2  25  19 - 32 mEq/L     Glucose, Bld  123 (*)  70 - 99 mg/dL     BUN  23  6 - 23 mg/dL     Creatinine, Ser  6.27 (*)  0.50 - 1.35 mg/dL     Calcium  7.8 (*)  8.4 - 10.5  mg/dL     Phosphorus  2.9  2.3 - 4.6 mg/dL     Albumin  2.4 (*)  3.5 - 5.2 g/dL     GFR calc non Af Amer  7 (*)  >90 mL/min     GFR calc Af Amer  8 (*)  >90 mL/min    Dg Chest 2 View  02/10/2012 *RADIOLOGY REPORT* Clinical Data: Status post dialysis. History of CHF CHEST - 2 VIEW Comparison: 02/08/2012 Findings: A dialysis catheter is again noted on the right. Bilateral pleural effusions left greater than right are again seen. The overall appearance has improved although this may be positional in nature. No focal confluent infiltrate is seen although there is likely left basilar infiltrate/atelectasis. IMPRESSION: Slight improvement in pleural effusions although this may be positional in nature. Original Report Authenticated By: Mark Lukens, M.D.  Post Admission Physician Evaluation:  1. Functional deficits secondary to deconditioning after TURP, acute renal failure, multiple medical issues. 2. Patient is admitted to receive collaborative, interdisciplinary care between the physiatrist, rehab nursing staff, and therapy team. 3. Patient's level of medical complexity and substantial therapy needs in context of that medical necessity cannot be provided at a lesser intensity of care such as a SNF. 4. Patient has experienced substantial functional loss from his/her baseline which was documented above under the "Functional History" and "Functional Status" headings. Judging by the patient's diagnosis, physical exam, and functional history, the patient has potential for functional progress which will result in measurable gains while on inpatient rehab. These gains will be of substantial and practical use upon discharge in facilitating mobility and self-care at the household level. 5. Physiatrist will provide 24 hour management of medical needs as well as oversight of the therapy plan/treatment and provide guidance as appropriate regarding the interaction of the two. 6. 24 hour rehab nursing will assist with  bladder management, bowel management, safety, skin/wound care, disease management, medication administration, pain management and patient education and help integrate therapy concepts, techniques,education, etc. 7. PT will assess and treat for: fxnl mobility, strength, balance, safety, adaptive equipment. Goals are: supervision to min assist. 8. OT will   assess and treat for: UES, ROM, ADL's, safety, fxnl mobility,adaptive equipment. Goals are: supervision to min assist. 9. SLP will assess and treat for: n/a. Goals are: n/a. 10. Case Management and Social Worker will assess and treat for psychological issues and discharge planning. 11. Team conference will be held weekly to assess progress toward goals and to determine barriers to discharge. 12. Patient will receive at least 3 hours of therapy per day at least 5 days per week. 13. ELOS: 10 days Prognosis: excellent Medical Problem List and Plan:  1. DVT Prophylaxis/Anticoagulation: Mechanical: Sequential compression devices, below knee Bilateral lower extremities  2. Pain Management: prn hydrocodone effective.  3. Mood: tired of being in the hospital and now diet restrictions. LCSW to follow up for formal evaluation. Offer ego support.  4. Neuropsych: This patient is capable of making decisions on his/her own behalf.  5. Acute on chronic renal failure: Now HD dependent. Continue HD T, T, S. Daily weights. Renal diet.  6. Atrial fibrillation: Monitor HR with bid checks. Continue metoprolol. Off coumadin due to recent bleeding events.  7. Mixed CHF with pleural effusion: Appears to be compensated on dialysis and FR. Continue daily weights.  8. Acute on chronic anemia: On aranesp weekly. Continue to monitor H/H with HD. Has been relatively stable despite ongoing urethral bleeding.  9. MDS: likely contributing to thrombocytopenia. Platelets on slow upward trend 58---> 82.  10. BPH s/p TURP: Foley to continue due to continue hematuria. Wife is concerned,  but encouraged her that this will be monitored closely along with hgb. Flush catheter as needed to ensure patency.  Zach Swartz, MD  

## 2012-02-11 NOTE — Evaluation (Addendum)
Physical Therapy Assessment and Plan  Patient Details  Name: Tracy [name] MRN: [id number] Date of Birth: Feb 07, 1920  PT Diagnosis: Difficulty walking and Muscle weakness Rehab Potential: Good ELOS: 7-10 days  Today's Date: 02/11/2012 Time:   14:40-15:25  Problem List:  Patient Active Problem List  Diagnosis  . HYPERLIPIDEMIA  . Essential hypertension, benign  . CAD  . CARDIOMYOPATHY  . PREMATURE VENTRICULAR CONTRACTIONS  . BRADYCARDIA  . CAROTID STENOSIS  . RENAL INSUFFICIENCY  . SYNCOPE  . DYSPNEA ON EXERTION  . NSTEMI (non-ST elevated myocardial infarction)  . Atrial fibrillation  . Encounter for long-term (current) use of anticoagulants  . Chronic diastolic heart failure  . Pancytopenia  . Pulmonary fibrosis  . Pulmonary nodule seen on imaging study  . Epistaxis  . Hypercalcemia  . CKD (chronic kidney disease), stage IV  . Cachexia  . MDS (myelodysplastic syndrome)  . Anemia associated with acute blood loss  . Warfarin-induced coagulopathy  . Hyperkalemia  . Elevated brain natriuretic peptide (BNP) level  . Chronic combined systolic and diastolic CHF (congestive heart failure)  . Acute-on-chronic renal failure  . Physical deconditioning    Past Medical History:  Past Medical History  Diagnosis Date  . CAD (coronary artery disease)     status post prior PCI to the obtuse marginal in 1997 and stenting to the mid to distal RCA in 2001; LHC 9/06:  LM ok, pLAD 50%, mLAD 60% then 60-70%, mRI 50%, mOM1 80-90% (small), pRCA and dRCA stents ok, PDA 50%, EF 65%;  Myoview 5/12: No ischemia, EF 64%.;  NSTEMI 11/12 tx medically   . Chronic kidney disease, stage III (moderate)   . TIA (transient ischemic attack)   . HTN (hypertension)   . Hyperlipidemia   . AF (atrial fibrillation) 02/16/11    Coumadin ==> patient decided to stop after admxn with worsening anemia in setting of UGI bleed, epistaxis  . Arthritis     "in my left ankle"  . Chronic diastolic heart failure     . Carotid stenosis     dopplers 03/2011: 0-39% bilateral  . MDS (myelodysplastic syndrome) 12/16/2011  . Hx of echocardiogram     Echocardiogram 02/17/11: Severe LVH, asymmetric hypertrophy, EF 50-55%, normal wall motion, high ventricular filling pressure, mild LAE, mild RVE, mildly reduced RVSF, mild RAE.  Marland Kitchen Myocardial infarction 2012  . Stroke     5 yrs ago.  Mini - NO RESIDUAL PROBLEMS  . Anemia of chronic disease     RECENT HOSPITALIZATION / BLOOD TRANSFUSION OCT 2013  . Shortness of breath     WITH ANY ACTIVITY---  NO OXYGEN  . CHF (congestive heart failure)     CHRONIC DIASTOLIC HEART FAILURE  . Hydrocele, right    Past Surgical History:  Past Surgical History  Procedure Date  . Nose surgery 1986    deviated septum  . Total knee arthroplasty 2009    left  . Joint replacement 2009    left knee  . Surgery scrotal / testicular 197O    CORRECTIVE SURGERY TO VARICOCELE (SWELLING OF SCROTUM)  . Esophagogastroduodenoscopy 12/09/2011    Procedure: ESOPHAGOGASTRODUODENOSCOPY (EGD);  Surgeon: Willis Modena, MD;  Location: [facility] ENDOSCOPY;  Service: Endoscopy;  Laterality: N/A;  outlaw/ebp  . Tonsillectomy and adenoidectomy 1929  . Appendectomy 1930's  . Left ear mastoid surgery 1933  . Removal of one testicle 1944  . Correction to botched varicocele surgery 1970  . Eye surgery 1993    SURGERY FOR DETACHED RETINA  LEFT EYE  . Left cataract extraction with lens implant 1993  . Coronary angioplasty with stent placement     3 procedures; 3 stents total  1994 & 1996  . Right cataract extraction with lens implant   1997  . Transurethral resection of prostate 01/21/2012    Procedure: TRANSURETHRAL RESECTION OF THE PROSTATE WITH GYRUS INSTRUMENTS;  Surgeon: [name], MD;  Location: [facility] ORS;  Service: Urology;  Laterality: N/A;  . Cystoscopy 01/28/2012    Procedure: CYSTOSCOPY;  Surgeon: [name], MD;  Location: [facility] ORS;  Service: Urology;  Laterality: N/A;  cystoscopy   .  Hematoma evacuation 01/28/2012    Procedure: EVACUATION HEMATOMA;  Surgeon: [name], MD;  Location: [facility] ORS;  Service: Urology;  Laterality: N/A;   with fulgeration evacuation of clot  . Av fistula placement 02/08/2012    Procedure: ARTERIOVENOUS (AV) FISTULA CREATION;  Surgeon: Chuck Hint, MD;  Location: [facility] OR;  Service: Vascular;  Laterality: Left;  . Insertion of dialysis catheter 02/08/2012    Procedure: INSERTION OF DIALYSIS CATHETER;  Surgeon: Chuck Hint, MD;  Location: [facility] OR;  Service: Vascular;  Laterality: Right;  Diatek exchange Right Internal Jugular    Assessment & Plan Clinical Impression:Tracy [name] is a 78 y.o. male with hx of CKD stage IV (2.5-3.0), HTN, TIA/CVA, L TKR, CAD w stents in place, afib, diast HF and Myelodysplastic syndrome, recent admission 12/10/11 for ABLA due to GIB as well as urethral bleeding. He was admitted on 01/21/12 for TURP (for BPH with outlet obstruction) by Dr. Vernie Ammons. Post-op had onset of gross hematuria treated with Amicar, bladder irrigation and on 11/1 he had cystoscopy with clot evacuation. Hospital course complicated by ABLA requiring multiple units of PRBC and platelets, syncope with dizziness and lethargy due to anemia and electrolyte abnormality. He developed acute on chronic renal failure with uremic symptoms and fluid overload requiring initiation of HD. Left AV fistula and R-IJ cath placed by Dr. Edilia Bo on 02/08/12. He contiues to have hematuria and foley to remain in place till urine clears prior to initiating voiding trial per urology. Therapies initiated and patient noted to be deconditioned with unsteady gait. Therapy team, family, MD requested CIR for progression.   Patient transferred to CIR on 02/11/2012 .   Patient currently requires max with mobility secondary to muscle weakness, decreased cardiorespiratoy endurance and decreased oxygen support and decreased sitting balance and decreased standing balance.  Prior  to hospitalization, patient was Independent with mobility and lived with Spouse in a House ([facility]) home.  Home access is 2Stairs to enter.  Patient will benefit from skilled PT intervention to maximize safe functional mobility, minimize fall risk and decrease caregiver burden for planned discharge home with 24 hour supervision.  Anticipate patient will benefit from follow up HH at discharge.  PT - End of Session Activity Tolerance: Tolerates < 10 min activity with changes in vital signs Endurance Deficit: Yes Endurance Deficit Description: 02 dropped to 7*% on room air with 20' walk PT Assessment Rehab Potential: Good Barriers to Discharge: Inaccessible home environment;None PT Plan PT Frequency: 1-2 X/day, 60-90 minutes Estimated Length of Stay: 2 weeks PT Treatment/Interventions: Ambulation/gait training;Balance/vestibular training;Community reintegration;Discharge planning;Disease management/prevention;DME/adaptive equipment instruction;Functional mobility training;Neuromuscular re-education;Pain management;Patient/family education;Skin care/wound management;Psychosocial support;Stair training;Therapeutic Activities;Therapeutic Exercise;UE/LE Strength taining/ROM;Wheelchair propulsion/positioning PT Recommendation Recommendations for Other Services: Neuropsych consult Follow Up Recommendations: Home health PT;24 hour supervision/assistance Equipment Recommended: Rolling walker with 5" wheels Equipment Details: TBD  PT Evaluation Precautions/Restrictions Precautions Precautions:  Fall Precaution Comments: Monitor vitals Restrictions Weight Bearing Restrictions: No  Vital Signs Therapy Vitals Temp: 97.4 F (36.3 C) Temp src: Oral Pulse Rate: 95  Resp: 18  BP: 100/54 mmHg Patient Position, if appropriate: Lying Oxygen Therapy SpO2: 59 % O2 Device: Nasal cannula O2 Flow Rate (L/min): 2 L/min Pain: no complaints Home Living/Prior Functioning Home Living Lives With:  Spouse Available Help at Discharge: Family;Available 24 hours/day Type of Home: House ([facility]) Home Access: Stairs to enter [employer] of Steps: 2 Entrance Stairs-Rails: None Home Layout: Two level;Bed/bath upstairs Alternate Level Stairs-Number of Steps: 13 Alternate Level Stairs-Rails: Right Bathroom Shower/Tub: Engineer, manufacturing systems: Standard Home Adaptive Equipment: Straight cane;Tub transfer bench Prior Function Level of Independence: Independent with basic ADLs;Independent with gait;Independent with transfers Able to Take Stairs?: Yes Driving: Yes Vocation: Retired Optometrist - History Baseline Vision: Wears glasses all the time Patient Visual Report: No change from baseline Perception Perception: Within Functional Limits Praxis Praxis: Intact  Cognition Overall Cognitive Status: Appears within functional limits for tasks assessed Arousal/Alertness: Awake/alert Orientation Level: Oriented X4 Sensation Sensation Light Touch: Appears Intact Coordination Gross Motor Movements are Fluid and Coordinated: Yes Fine Motor Movements are Fluid and Coordinated: Yes Motor  Motor Motor: Within Functional Limits  Mobility Bed Mobility Bed Mobility: Supine to Sit;Sit to Supine;Sitting - Scoot to Edge of Bed Supine to Sit: 4: Min guard;HOB flat Sitting - Scoot to [place] of Bed: 4: Min guard Sit to Supine: 4: Min guard;HOB flat Transfers Sit to Stand: 3: Mod assist;With upper extremity assist;From bed Sit to Stand Details: Verbal cues for sequencing;Verbal cues for technique;Verbal cues for precautions/safety;Manual facilitation for weight shifting Stand to Sit: 4: Min assist;With upper extremity assist;To bed Stand to Sit Details (indicate cue type and reason): Verbal cues for sequencing;Verbal cues for technique;Manual facilitation for placement Locomotion  Ambulation Ambulation: Yes Ambulation/Gait Assistance: 1: +2 Total assist;Other  (comment) Ambulation Distance (Feet): 20 Feet Assistive device: None Ambulation/Gait Assistance Details: Tactile cues for weight shifting;Tactile cues for posture;Verbal cues for sequencing;Verbal cues for technique;Verbal cues for gait pattern;Manual facilitation for weight shifting Stairs / Additional Locomotion Stairs: No Wheelchair Mobility Wheelchair Mobility: No  Trunk/Postural Assessment  Cervical Assessment Cervical Assessment: Within Functional Limits Thoracic Assessment Thoracic Assessment: Within Functional Limits Lumbar Assessment Lumbar Assessment: Within Functional Limits Postural Control Postural Control: Within Functional Limits  Balance Balance Balance Assessed: Yes Static Sitting Balance Static Sitting - Balance Support: Right upper extremity supported;Feet supported Static Sitting - Level of Assistance: 5: Stand by assistance Dynamic Sitting Balance Dynamic Sitting - Balance Support: Right upper extremity supported;Feet supported;During functional activity Dynamic Sitting - Level of Assistance: 4: Min assist Dynamic Sitting - Balance Activities: Lateral lean/weight shifting;Forward lean/weight shifting;Reaching for objects Static Standing Balance Static Standing - Balance Support: No upper extremity supported;During functional activity Static Standing - Level of Assistance: 3: Mod assist Static Standing - Comment/# of Minutes: 1.min in preparation for gait Dynamic Standing Balance Dynamic Standing - Balance Support: Bilateral upper extremity supported Dynamic Standing - Level of Assistance: 3: Mod assist Dynamic Standing - Balance Activities: Lateral lean/weight shifting;Forward lean/weight shifting Extremity Assessment  RUE Assessment RUE Assessment: Within Functional Limits LUE Assessment LUE Assessment: Within Functional Limits RLE Assessment RLE Assessment: Exceptions to [facility] RLE Strength RLE Overall Strength Comments: Grossly 4/5 throughout LLE  Assessment LLE Assessment: Exceptions to [facility] LLE Strength LLE Overall Strength Comments: Grossly 4/5 throughout  See FIM for current functional status Refer to Care Plan for Long Term Goals  Recommendations for other services: Neuropsych  Discharge Criteria: Patient will be discharged from PT if patient refuses treatment 3 consecutive times without medical reason, if treatment goals not met, if there is a change in medical status, if patient makes no progress towards goals or if patient is discharged from hospital.  The above assessment, treatment plan, treatment alternatives and goals were discussed and mutually agreed upon: by patient and by family  Virl Cagey, PT  02/11/2012, 4:01 PM

## 2012-02-12 ENCOUNTER — Inpatient Hospital Stay (HOSPITAL_COMMUNITY): Payer: Medicare Other | Admitting: *Deleted

## 2012-02-12 DIAGNOSIS — N19 Unspecified kidney failure: Secondary | ICD-10-CM

## 2012-02-12 DIAGNOSIS — R5381 Other malaise: Secondary | ICD-10-CM

## 2012-02-12 DIAGNOSIS — R339 Retention of urine, unspecified: Secondary | ICD-10-CM

## 2012-02-12 MED ORDER — METOPROLOL TARTRATE 25 MG PO TABS
25.0000 mg | ORAL_TABLET | Freq: Two times a day (BID) | ORAL | Status: DC
  Administered 2012-02-12 – 2012-02-14 (×3): 25 mg via ORAL
  Filled 2012-02-12 (×6): qty 1

## 2012-02-12 NOTE — Progress Notes (Signed)
Physical Therapy Session Note  Patient Details  Name: Tracy [name] MRN: [id number] Date of Birth: 05-Jun-1919  Today's Date: 02/12/2012 Time: 8:03-9:03 and 13:00-13:30 Time calc 60 min and 30 min     Short Term Goals: Week 1:  PT Short Term Goal 1 (Week 1): STG=LTG due to LOS, S overall  Skilled Therapeutic Interventions/Progress Updates:  AM tx Tx focused on gait training with RW, transfer training, stairs, and therex for LE strengthening and increased activity tolerance. Pt on 2L 02 throughout and VSS >94% with HR 70-80's with all activity.  Supine>sit with S and bed rail. Stand-step transfer with Min A for steadying.  Pt instructed on WC management and propulsion, and propelled WC 120' with S only.  Stair training 1x6 with bil rails and Min A for steadying, pt very fatigued at end.  Gait training with instructions and demonstration on safe RW use, 2x20' with Mod A for balance, pt very unstable.  Sit<>stand x2 with instruction on safe technique, pt choosing not to follow cues, but do it his way: "Pushing up from the chair is not my problem."  Nustep x36min, level 4 for strengthening and activity tolerance with bil UE/LE with 5 rest breaks throughout.  Pt wanted to get back to bed at end of tx with Min A for steadying transfer and S for sit>supine. Pt encouraged to spend as much time OOB as possible.  Pt c/o LUE soreness, nursing aware and providing medication.   PM tx Pt premedicated and feeling "dopey." Modified treatment accordingly.  Pt only able to propell WC x40' for activity tolerance due to fatigue with S.  Sit<>stand in // bars with Min A. Standing therex for activity tolerance including each of the following x10 bil with seated rest needed between: marching, HS curl, mini squats (x5)  Gait training in // bars 2x20' with Min A for steadying, limited by fatigue.  Stand-pivot transfer in room with Min A. Sit>supine with S only increased time and effort required.       Therapy  Documentation Precautions:  Precautions Precautions: Fall Precaution Comments: Monitor vitals Restrictions Weight Bearing Restrictions: No    Locomotion : Ambulation Ambulation/Gait Assistance: 3: Mod assist Wheelchair Mobility Distance: 100   See FIM for current functional status  Therapy/Group: Individual Therapy  Virl Cagey, PT 02/12/2012, 8:58 AM

## 2012-02-12 NOTE — Progress Notes (Signed)
Subjective:   Sitting in wheelchair in rehab, already showing improvement; no complaints, no dyspnea.02 sat 85% on RA  Objective: Vital signs in last 24 hours: Temp:  [97.4 F (36.3 C)-98.4 F (36.9 C)] 98.1 F (36.7 C) (11/16 0530) Pulse Rate:  [79-105] 79  (11/16 0530) Resp:  [18] 18  (11/16 0530) BP: (100-120)/(54-65) 115/65 mmHg (11/16 0530) SpO2:  [59 %-97 %] 97 % (11/16 0530) Weight:  [73.9 kg (162 lb 14.7 oz)-74.254 kg (163 lb 11.2 oz)] 73.9 kg (162 lb 14.7 oz) (11/16 0530) Weight change:   Intake/Output from previous day: 11/15 0701 - 11/16 0700 In: 200 [P.O.:200] Out: 60 [Urine:60]   EXAM: General appearance:  Alert, in no apparent distress Resp:  Fine crackles in L base Cardio:  RRR without murmur or rub GI: _ BS, soft and nontender, no sacral edema Extremities:  Trace pedal edema bilaterally Access:  Right IJ catheter, AVF @ LUA with + bruit  Wt 73.9 kg today  Lab Results:  Basename 02/10/12 0731  WBC 13.0*  HGB 8.7*  HCT 28.1*  PLT 82*   BMET:  Basename 02/10/12 0731 02/09/12 1359  NA 139 136  K 3.8 3.7  CL 103 103  CO2 25 23  GLUCOSE 123* 133*  BUN 23 30*  CREATININE 6.27* 7.22*  CALCIUM 7.8* 7.4*  ALBUMIN 2.4* 2.4*   No results found for this basename: PTH:2 in the last 72 hours Iron Studies: No results found for this basename: IRON,TIBC,TRANSFERRIN,FERRITIN in the last 72 hours  Assessment/Plan: 1. Acute on chronic renal failure - now on HD (first on 11/9 with temporary catheter); received R IJ catheter with creation of AVF per Dr. Edilia Bo 11/12; K 3.8 yesterday; tolerating well.  HD pending today, outpatient HD will be on MWF @ [facility] 2. Volume overload - Improved with HD, still with mild pedal edema. 02 sat 85% on RA--for dialysis today--try for weight 70 kg.  Decrease metoprolol to 25 BID and hold prior to HD 3. HTN - BP 115/65 on Metoprolol 50--now 25 mg bid; wt down to 73.9 kg.Try for 70 kg today 4. Anemia - Hgb 8.7, Fe sat 37% with ferritin  337 on 11/11; on Aranesp 100 mcg on Mon. 5. Secondary hyperparathyroidism - Ca 7.8 (9.1 corrected), P 2.9, iPTH 700 on 11/11; no Zemplar or binders.   6. Hx BPH - s/p TURP 10/25 per Dr. Vernie Ammons, with Foley. 7. Thrombocytopenia/hematuria - Plts 82, no Heparin with HD. 8. Hx CVA 9. CAD/Hx A-fib - s/p stent, HR controlled. 10. Deconditioning - began rehab yesterday. 11. Myelodysplastic syndrome   LOS: 1 day   Tracy [name],Tracy [name] 02/12/2012,8:36 AM Awake, alert, enjoys being on rehab.  Still very weak.  Fir HD later today.  Decrease metoprolol to 25 BID and hold prior to HD.

## 2012-02-12 NOTE — Progress Notes (Signed)
Patient ID: Tracy [name], male   DOB: Sep 04, 1919, 78 y.o.   MRN: [id number] 78 y.o. male with hx of CKD stage IV (2.5-3.0), HTN, TIA/CVA, L TKR, CAD w stents in place, afib, diast HF and Myelodysplastic syndrome, recent admission 12/10/11 for ABLA due to GIB as well as urethral bleeding. He was admitted on 01/21/12 for TURP (for BPH with outlet obstruction) by Dr. Vernie Ammons. Post-op had onset of gross hematuria treated with Amicar, bladder irrigation and on 11/1 he had cystoscopy with clot evacuation. Hospital course complicated by ABLA requiring multiple units of PRBC and platelets, syncope with dizziness and lethargy due to anemia and electrolyte abnormality. He developed acute on chronic renal failure with uremic symptoms and fluid overload requiring initiation of HD. Left AV fistula and R-IJ cath placed by Dr. Edilia Bo on 02/08/12. He contiues to have hematuria and foley to remain in place till urine clears prior to initiating voiding trial per urology. Therapies initiated and patient noted to be deconditioned with unsteady gait  Subjective/Complaints: My English muffin is not toasted! Is my HD today?  Review of Systems  Genitourinary: Positive for hematuria.  Neurological: Positive for weakness.  All other systems reviewed and are negative.    Objective: Vital Signs: Blood pressure 115/65, pulse 79, temperature 98.1 F (36.7 C), temperature source Oral, resp. rate 18, weight 73.9 kg (162 lb 14.7 oz), SpO2 97.00%. Dg Chest 2 View  02/10/2012  *RADIOLOGY REPORT*  Clinical Data: Status post dialysis.  History of CHF  CHEST - 2 VIEW  Comparison: 02/08/2012  Findings: A dialysis catheter is again noted on the right. Bilateral pleural effusions left greater than right are again seen. The overall appearance has improved although this may be positional in nature. No focal confluent infiltrate is seen although there is likely left basilar infiltrate/atelectasis.  IMPRESSION: Slight improvement in pleural  effusions although this may be positional in nature.   Original Report Authenticated By: Alcide Clever, M.D.    Results for orders placed during the hospital encounter of 01/21/12 (from the past 72 hour(s))  RENAL FUNCTION PANEL     Status: Abnormal   Collection Time   02/09/12  1:59 PM      Component Value Range Comment   Sodium 136  135 - 145 mEq/L    Potassium 3.7  3.5 - 5.1 mEq/L    Chloride 103  96 - 112 mEq/L    CO2 23  19 - 32 mEq/L    Glucose, Bld 133 (*) 70 - 99 mg/dL    BUN 30 (*) 6 - 23 mg/dL    Creatinine, Ser 1.32 (*) 0.50 - 1.35 mg/dL    Calcium 7.4 (*) 8.4 - 10.5 mg/dL    Phosphorus 3.3  2.3 - 4.6 mg/dL    Albumin 2.4 (*) 3.5 - 5.2 g/dL    GFR calc non Af Amer 6 (*) >90 mL/min    GFR calc Af Amer 7 (*) >90 mL/min   CBC     Status: Abnormal   Collection Time   02/10/12  7:31 AM      Component Value Range Comment   WBC 13.0 (*) 4.0 - 10.5 K/uL    RBC 3.04 (*) 4.22 - 5.81 MIL/uL    Hemoglobin 8.7 (*) 13.0 - 17.0 g/dL    HCT 44.0 (*) 10.2 - 52.0 %    MCV 92.4  78.0 - 100.0 fL    MCH 28.6  26.0 - 34.0 pg    MCHC 31.0  30.0 -  36.0 g/dL    RDW 40.9 (*) 81.1 - 15.5 %    Platelets 82 (*) 150 - 400 K/uL CONSISTENT WITH PREVIOUS RESULT  RENAL FUNCTION PANEL     Status: Abnormal   Collection Time   02/10/12  7:31 AM      Component Value Range Comment   Sodium 139  135 - 145 mEq/L    Potassium 3.8  3.5 - 5.1 mEq/L    Chloride 103  96 - 112 mEq/L    CO2 25  19 - 32 mEq/L    Glucose, Bld 123 (*) 70 - 99 mg/dL    BUN 23  6 - 23 mg/dL    Creatinine, Ser 9.14 (*) 0.50 - 1.35 mg/dL    Calcium 7.8 (*) 8.4 - 10.5 mg/dL    Phosphorus 2.9  2.3 - 4.6 mg/dL    Albumin 2.4 (*) 3.5 - 5.2 g/dL    GFR calc non Af Amer 7 (*) >90 mL/min    GFR calc Af Amer 8 (*) >90 mL/min      HEENT: normal Cardio: irregular Resp: CTA B/L GI: BS positive Extremity:  No Edema Skin:   Wound redness L antecubital fossa Neuro: Alert/Oriented Musc/Skel:  Swelling L elbow op site Gen  NAD   Assessment/Plan: 1. Functional deficits secondary to deconditioning acute on chronic renal failure, anemia due to hematuria which require 3+ hours per day of interdisciplinary therapy in a comprehensive inpatient rehab setting. Physiatrist is providing close team supervision and 24 hour management of active medical problems listed below. Physiatrist and rehab team continue to assess barriers to discharge/monitor patient progress toward functional and medical goals. FIM:                FIM - Locomotion: Ambulation Ambulation/Gait Assistance: 1: +2 Total assist;Other (comment)  Comprehension Comprehension Mode: Auditory Comprehension: 5-Follows basic conversation/direction: With no assist  Expression Expression Mode: Verbal Expression: 5-Expresses basic needs/ideas: With no assist        Memory Memory: 4-Recognizes or recalls 75 - 89% of the time/requires cueing 10 - 24% of the time  Medical Problem List and Plan:  1. DVT Prophylaxis/Anticoagulation: Mechanical: Sequential compression devices, below knee Bilateral lower extremities  2. Pain Management: prn hydrocodone effective.  3. Mood: tired of being in the hospital and now diet restrictions. LCSW to follow up for formal evaluation. Offer ego support.  4. Neuropsych: This patient is capable of making decisions on his/her own behalf.  5. Acute on chronic renal failure: Now HD dependent. Continue HD T, T, S. Daily weights. Renal diet.  6. Atrial fibrillation: Monitor HR with bid checks. Continue metoprolol. Off coumadin due to recent bleeding events.  7. Mixed CHF with pleural effusion: Appears to be compensated on dialysis and FR. Continue daily weights.  8. Acute on chronic anemia: On aranesp weekly. Continue to monitor H/H with HD. Has been relatively stable despite ongoing urethral bleeding.  9. MDS: likely contributing to thrombocytopenia. Platelets on slow upward trend 58---> 82.  10. BPH s/p TURP: Foley to  continue due to continue hematuria. Wife is concerned, but encouraged her that this will be monitored closely along with hgb. Flush catheter as needed to ensure patency    LOS (Days) 1 A FACE TO FACE EVALUATION WAS PERFORMED  KIRSTEINS,ANDREW E 02/12/2012, 7:20 AM

## 2012-02-12 NOTE — Evaluation (Signed)
Occupational Therapy Assessment and Plan  Patient Details  Name: Tracy [name] MRN: [id number] Date of Birth: 05/03/1919  OT Diagnosis: muscle weakness (generalized) Rehab Potential:  good ELOS:   7-10 day  Today's Date: 02/12/2012 Time: 0930-1100 Time Calculation (min): 90 min  Problem List:  Patient Active Problem List  Diagnosis  . HYPERLIPIDEMIA  . Essential hypertension, benign  . CAD  . CARDIOMYOPATHY  . PREMATURE VENTRICULAR CONTRACTIONS  . BRADYCARDIA  . CAROTID STENOSIS  . RENAL INSUFFICIENCY  . SYNCOPE  . DYSPNEA ON EXERTION  . NSTEMI (non-ST elevated myocardial infarction)  . Atrial fibrillation  . Encounter for long-term (current) use of anticoagulants  . Chronic diastolic heart failure  . Pancytopenia  . Pulmonary fibrosis  . Pulmonary nodule seen on imaging study  . Epistaxis  . Hypercalcemia  . CKD (chronic kidney disease), stage IV  . Cachexia  . MDS (myelodysplastic syndrome)  . Anemia associated with acute blood loss  . Warfarin-induced coagulopathy  . Hyperkalemia  . Elevated brain natriuretic peptide (BNP) level  . Chronic combined systolic and diastolic CHF (congestive heart failure)  . Acute-on-chronic renal failure  . Physical deconditioning    Past Medical History:  Past Medical History  Diagnosis Date  . CAD (coronary artery disease)     status post prior PCI to the obtuse marginal in 1997 and stenting to the mid to distal RCA in 2001; LHC 9/06:  LM ok, pLAD 50%, mLAD 60% then 60-70%, mRI 50%, mOM1 80-90% (small), pRCA and dRCA stents ok, PDA 50%, EF 65%;  Myoview 5/12: No ischemia, EF 64%.;  NSTEMI 11/12 tx medically   . Chronic kidney disease, stage III (moderate)   . TIA (transient ischemic attack)   . HTN (hypertension)   . Hyperlipidemia   . AF (atrial fibrillation) 02/16/11    Coumadin ==> patient decided to stop after admxn with worsening anemia in setting of UGI bleed, epistaxis  . Arthritis     "in my left ankle"  . Chronic  diastolic heart failure   . Carotid stenosis     dopplers 03/2011: 0-39% bilateral  . MDS (myelodysplastic syndrome) 12/16/2011  . Hx of echocardiogram     Echocardiogram 02/17/11: Severe LVH, asymmetric hypertrophy, EF 50-55%, normal wall motion, high ventricular filling pressure, mild LAE, mild RVE, mildly reduced RVSF, mild RAE.  Marland Kitchen Myocardial infarction 2012  . Stroke     5 yrs ago.  Mini - NO RESIDUAL PROBLEMS  . Anemia of chronic disease     RECENT HOSPITALIZATION / BLOOD TRANSFUSION OCT 2013  . Shortness of breath     WITH ANY ACTIVITY---  NO OXYGEN  . CHF (congestive heart failure)     CHRONIC DIASTOLIC HEART FAILURE  . Hydrocele, right    Past Surgical History:  Past Surgical History  Procedure Date  . Nose surgery 1986    deviated septum  . Total knee arthroplasty 2009    left  . Joint replacement 2009    left knee  . Surgery scrotal / testicular 197O    CORRECTIVE SURGERY TO VARICOCELE (SWELLING OF SCROTUM)  . Esophagogastroduodenoscopy 12/09/2011    Procedure: ESOPHAGOGASTRODUODENOSCOPY (EGD);  Surgeon: Willis Modena, MD;  Location: [facility] ENDOSCOPY;  Service: Endoscopy;  Laterality: N/A;  outlaw/ebp  . Tonsillectomy and adenoidectomy 1929  . Appendectomy 1930's  . Left ear mastoid surgery 1933  . Removal of one testicle 1944  . Correction to botched varicocele surgery 1970  . Eye surgery 1993    SURGERY  FOR DETACHED RETINA LEFT EYE  . Left cataract extraction with lens implant 1993  . Coronary angioplasty with stent placement     3 procedures; 3 stents total  1994 & 1996  . Right cataract extraction with lens implant   1997  . Transurethral resection of prostate 01/21/2012    Procedure: TRANSURETHRAL RESECTION OF THE PROSTATE WITH GYRUS INSTRUMENTS;  Surgeon: [name], MD;  Location: [facility] ORS;  Service: Urology;  Laterality: N/A;  . Cystoscopy 01/28/2012    Procedure: CYSTOSCOPY;  Surgeon: [name], MD;  Location: [facility] ORS;  Service: Urology;  Laterality: N/A;   cystoscopy   . Hematoma evacuation 01/28/2012    Procedure: EVACUATION HEMATOMA;  Surgeon: [name], MD;  Location: [facility] ORS;  Service: Urology;  Laterality: N/A;   with fulgeration evacuation of clot  . Av fistula placement 02/08/2012    Procedure: ARTERIOVENOUS (AV) FISTULA CREATION;  Surgeon: Chuck Hint, MD;  Location: [facility] OR;  Service: Vascular;  Laterality: Left;  . Insertion of dialysis catheter 02/08/2012    Procedure: INSERTION OF DIALYSIS CATHETER;  Surgeon: Chuck Hint, MD;  Location: [facility] OR;  Service: Vascular;  Laterality: Right;  Diatek exchange Right Internal Jugular    Assessment & Plan Clinical Impression: : Tracy [name] is a 78 y.o. male with hx of CKD stage IV (2.5-3.0), HTN, TIA/CVA, L TKR, CAD w stents in place, afib, diast HF and Myelodysplastic syndrome, recent admission 12/10/11 for ABLA due to GIB as well as urethral bleeding. He was admitted on 01/21/12 for TURP (for BPH with outlet obstruction) by Dr. Vernie Ammons. Post-op had onset of gross hematuria treated with Amicar, bladder irrigation and on 11/1 he had cystoscopy with clot evacuation. Hospital course complicated by ABLA requiring multiple units of PRBC and platelets, syncope with dizziness and lethargy due to anemia and electrolyte abnormality. He developed acute on chronic renal failure with uremic symptoms and fluid overload requiring initiation of HD. Left AV fistula and R-IJ cath placed by Dr. Edilia Bo on 02/08/12. He contiues to have hematuria and foley to remain in place till urine clears prior to initiating voiding trial per urology. Therapies initiated and patient noted to be deconditioned with unsteady gait. Therapy team, family, MD requested CIR for progression.   Patient transferred to CIR on 02/11/2012 .    Patient currently requires mod with basic self-care skills secondary to muscle weakness.  Prior to hospitalization, patient could complete BADL with no assist.  Patient will benefit from  skilled intervention to increase independence with basic self-care skills prior to discharge home with care partner.  Anticipate patient will require intermittent supervision and follow up home health.     OT Evaluation Precautions/Restrictions:  Fall   Vital Signs Therapy Vitals  Patient Position, if appropriate: Lying Oxygen Therapy SpO2: 92 %;  78% on room air O2 Device: Nasal cannula O2 Flow Rate (L/min): 2 L/min Pain Pain Assessment Pain Assessment: 0-10 Pain Score:   5 Pain Type: Surgical pain Pain Location: Arm Pain Orientation: Left Pain Descriptors: Throbbing Patients Stated Pain Goal: 2 Pain Intervention(S): Medication (See eMAR) Home Living/Prior Functioning Home Living Lives With: Spouse Available Help at Discharge: Family;Available 24 hours/day Type of Home: House Home Access: Stairs to enter [employer] of Steps: 2 Entrance Stairs-Rails: None Home Layout: Two level;Bed/bath upstairs Alternate Level Stairs-Number of Steps: 13 Alternate Level Stairs-Rails: Right Bathroom Shower/Tub: Tub/shower unit;Curtain Bathroom Toilet: Standard Bathroom Accessibility: Yes (1/2 bath on main level; full bath on 2nd level)  How Accessible: Accessible via walker Home Adaptive Equipment: Straight cane;Tub transfer bench;Shower chair with back;Grab bars in shower Prior Function Level of Independence: Independent with basic ADLs;Independent with gait;Independent with transfers Able to Take Stairs?: Yes Driving: Yes Vocation: Retired ADL   Vision/Perception     Chartered loss adjuster Light Touch: Appears Intact Coordination Fine Motor Movements are Fluid and Coordinated: No (LUE stiff from fistula) Motor    Mobility:    Used RW to bathroom with mod. Assist, cues to take time     Trunk/Postural Assessment:  Trunk flexion    Balance:  Mod assist for dynamic   Extremity/Trunk Assessment   LUE Assessment LUE Assessment: Exceptions to WFL (LUE  limited due to fistula) LUE Strength LUE Overall Strength: Due to pain (Left fistula in elbow) Left Shoulder Flexion: 3+/5  See FIM for current functional status Refer to Care Plan for Long Term Goals  Recommendations for other services: None  Discharge Criteria: Patient will be discharged from OT if patient refuses treatment 3 consecutive times without medical reason, if treatment goals not met, if there is a change in medical status, if patient makes no progress towards goals or if patient is discharged from hospital.  The above assessment, treatment plan, treatment alternatives and goals were discussed and mutually agreed upon: by patient  Humberto Seals 02/12/2012, 6:13 PM

## 2012-02-13 ENCOUNTER — Inpatient Hospital Stay (HOSPITAL_COMMUNITY): Payer: Medicare Other | Admitting: Physical Therapy

## 2012-02-13 LAB — CBC
MCH: 28.6 pg (ref 26.0–34.0)
Platelets: 91 10*3/uL — ABNORMAL LOW (ref 150–400)
RBC: 2.9 MIL/uL — ABNORMAL LOW (ref 4.22–5.81)

## 2012-02-13 LAB — RENAL FUNCTION PANEL
Albumin: 2.3 g/dL — ABNORMAL LOW (ref 3.5–5.2)
CO2: 26 mEq/L (ref 19–32)
Chloride: 99 mEq/L (ref 96–112)
Creatinine, Ser: 7.91 mg/dL — ABNORMAL HIGH (ref 0.50–1.35)
GFR calc Af Amer: 6 mL/min — ABNORMAL LOW (ref >90)
GFR calc non Af Amer: 5 mL/min — ABNORMAL LOW (ref >90)
Potassium: 4.2 mEq/L (ref 3.5–5.1)
Sodium: 136 mEq/L (ref 135–145)

## 2012-02-13 MED ORDER — KIDNEY FAILURE BOOK
Freq: Once | Status: AC
  Administered 2012-02-13: 15:00:00
  Filled 2012-02-13: qty 1

## 2012-02-13 NOTE — Progress Notes (Signed)
Subjective:   Seen on HD, comfortable, no complaints.  Objective: Vital signs in last 24 hours: Temp:  [97.8 F (36.6 C)-98.1 F (36.7 C)] 98.1 F (36.7 C) (11/17 0555) Pulse Rate:  [74-85] 84  (11/17 0555) Resp:  [18] 18  (11/17 0555) BP: (100-106)/(57-58) 106/58 mmHg (11/17 0555) SpO2:  [98 %-100 %] 100 % (11/17 0555) Weight:  [73.5 kg (162 lb 0.6 oz)] 73.5 kg (162 lb 0.6 oz) (11/17 0555) Weight change: -0.754 kg (-1 lb 10.6 oz)  Intake/Output from previous day: 11/16 0701 - 11/17 0700 In: 240 [P.O.:240] Out: 100 [Urine:100] Intake/Output this shift: Total I/O In: 120 [P.O.:120] Out: 100 [Urine:100] EXAM: General appearance:  Alert, in no apparent distress Resp: Mild crackles at bases  Cardio:  RRR without murmur or rub GI: BS, soft and nontender Extremities:  Trace pedal edema bilaterally Access:  Right IJ catheter with BFR 300 cc/min, AVF @ LUA with + bruit  Lab Results:  Basename 02/10/12 0731  WBC 13.0*  HGB 8.7*  HCT 28.1*  PLT 82*   BMET:  Basename 02/10/12 0731  NA 139  K 3.8  CL 103  CO2 25  GLUCOSE 123*  BUN 23  CREATININE 6.27*  CALCIUM 7.8*  ALBUMIN 2.4*   No results found for this basename: PTH:2 in the last 72 hours Iron Studies: No results found for this basename: IRON,TIBC,TRANSFERRIN,FERRITIN in the last 72 hours   Assessment/Plan: 1. Acute on chronic renal failure - now on HD (first on 11/9 with temporary catheter); received R IJ catheter with creation of AVF per Dr. Edilia Bo 11/12; K 3.8 on 11/14; tolerating well.  Outpatient HD will be on MWF @ [facility] 2. Volume overload - Improved with HD, still with mild pedal edema.  Try for goal of 70 kg today. CXR still shows pleural effusions and vascular congestion (on 14 Nov CXR)--weights atroundthat time are as follows:                               10 Nov--85.6 kg; 12 Nov  81.7 kg; 14 Nov (post HD) 72.5 kg.  Pre HD weight today 72.8 kg on bed scale.  I still think he has fluid to give up as O2 sats  are 85 % on RA (yesterday) 3. HTN - BP 115/65 on Metoprolol decreased to 25 mg bid (hold pre-HD); pre-HD wt 71.2 kg using standing scale.  Didn't get dialysis on 16 Nov--on HD today (Sunday).  Try for 70 kg today. 4. Anemia - Hgb 8.7, Fe sat 37% with ferritin 337 on 11/11; on Aranesp 100 mcg on Mon.  See 11 below 5. Secondary hyperparathyroidism - Ca 7.8 (9.1 corrected), P 2.9, iPTH 700 on 11/11; no Zemplar or binders.  6. Hx BPH - s/p TURP 10/25 per Dr. Vernie Ammons, with Foley. 7. Thrombocytopenia/hematuria - Plts 82, no Heparin with HD. 8. Hx CVA 9. CAD/Hx A-fib - s/p stent, HR controlled. 10. Deconditioning - began rehab 11/15. 11. Myelodysplastic syndrome    LOS: 2 days   LYLES,CHARLES 02/13/2012,6:44 AM I spoke with and examined Mr. Hissong while he was on dialysis today.  BP 111/61, goal 2.9 L. K 4.2 and hgb 8.3 this morning.  He's scheduled for MWF at [facility] as outpatient.  He's on HD now (Sunday) so I's suggest T-Th-Sat for next few days--then switch to MWF when needed.  Check repeat CXR in a couple of days.

## 2012-02-13 NOTE — Progress Notes (Signed)
Physical Therapy Note  Patient Details  Name: Tracy [name] MRN: [id number] Date of Birth: [birth year] Today's Date: 02/13/2012  1515-1535 (20 minutes) individual Pain: 4/10 Left arm / denies need for pain meds Focus of treatment: gait training/endurance Treatment: Pt required max encouragement to participate in PT session. Agreed to attempt ambulation. Gait from bed to door and back ( 15 feet ) RW min assist to max assist during direction changes to prevent loss of balance. Oxygen sats > 92% RA.    Lissette Schenk,JIM 02/13/2012, 1:54 PM

## 2012-02-14 ENCOUNTER — Encounter (HOSPITAL_COMMUNITY): Payer: Medicare Other | Admitting: Occupational Therapy

## 2012-02-14 ENCOUNTER — Inpatient Hospital Stay (HOSPITAL_COMMUNITY): Payer: Medicare Other | Admitting: Physical Therapy

## 2012-02-14 ENCOUNTER — Inpatient Hospital Stay (HOSPITAL_COMMUNITY): Payer: Medicare Other | Admitting: Occupational Therapy

## 2012-02-14 DIAGNOSIS — R5381 Other malaise: Secondary | ICD-10-CM

## 2012-02-14 DIAGNOSIS — R339 Retention of urine, unspecified: Secondary | ICD-10-CM

## 2012-02-14 DIAGNOSIS — N19 Unspecified kidney failure: Secondary | ICD-10-CM

## 2012-02-14 MED ORDER — METOPROLOL TARTRATE 12.5 MG HALF TABLET
12.5000 mg | ORAL_TABLET | Freq: Two times a day (BID) | ORAL | Status: DC
  Administered 2012-02-14 – 2012-02-23 (×12): 12.5 mg via ORAL
  Filled 2012-02-14 (×20): qty 1

## 2012-02-14 MED ORDER — CIPROFLOXACIN-DEXAMETHASONE 0.3-0.1 % OT SUSP
4.0000 [drp] | Freq: Two times a day (BID) | OTIC | Status: DC
  Administered 2012-02-14 – 2012-02-23 (×18): 4 [drp] via OTIC
  Filled 2012-02-14 (×3): qty 7.5

## 2012-02-14 NOTE — Progress Notes (Addendum)
INITIAL ADULT NUTRITION ASSESSMENT Date: 02/14/2012   Time: 11:10 AM Reason for Assessment: Consult for diet education and alternative menu choices  ASSESSMENT: Male 78 y.o.  Dx: Physical deconditioning  Hx:  Past Medical History  Diagnosis Date  . CAD (coronary artery disease)     status post prior PCI to the obtuse marginal in 1997 and stenting to the mid to distal RCA in 2001; LHC 9/06:  LM ok, pLAD 50%, mLAD 60% then 60-70%, mRI 50%, mOM1 80-90% (small), pRCA and dRCA stents ok, PDA 50%, EF 65%;  Myoview 5/12: No ischemia, EF 64%.;  NSTEMI 11/12 tx medically   . Chronic kidney disease, stage III (moderate)   . TIA (transient ischemic attack)   . HTN (hypertension)   . Hyperlipidemia   . AF (atrial fibrillation) 02/16/11    Coumadin ==> patient decided to stop after admxn with worsening anemia in setting of UGI bleed, epistaxis  . Arthritis     "in my left ankle"  . Chronic diastolic heart failure   . Carotid stenosis     dopplers 03/2011: 0-39% bilateral  . MDS (myelodysplastic syndrome) 12/16/2011  . Hx of echocardiogram     Echocardiogram 02/17/11: Severe LVH, asymmetric hypertrophy, EF 50-55%, normal wall motion, high ventricular filling pressure, mild LAE, mild RVE, mildly reduced RVSF, mild RAE.  Marland Kitchen Myocardial infarction 2012  . Stroke     5 yrs ago.  Mini - NO RESIDUAL PROBLEMS  . Anemia of chronic disease     RECENT HOSPITALIZATION / BLOOD TRANSFUSION OCT 2013  . Shortness of breath     WITH ANY ACTIVITY---  NO OXYGEN  . CHF (congestive heart failure)     CHRONIC DIASTOLIC HEART FAILURE  . Hydrocele, right    Past Surgical History  Procedure Date  . Nose surgery 1986    deviated septum  . Total knee arthroplasty 2009    left  . Joint replacement 2009    left knee  . Surgery scrotal / testicular 197O    CORRECTIVE SURGERY TO VARICOCELE (SWELLING OF SCROTUM)  . Esophagogastroduodenoscopy 12/09/2011    Procedure: ESOPHAGOGASTRODUODENOSCOPY (EGD);  Surgeon:  Willis Modena, MD;  Location: [facility] ENDOSCOPY;  Service: Endoscopy;  Laterality: N/A;  outlaw/ebp  . Tonsillectomy and adenoidectomy 1929  . Appendectomy 1930's  . Left ear mastoid surgery 1933  . Removal of one testicle 1944  . Correction to botched varicocele surgery 1970  . Eye surgery 1993    SURGERY FOR DETACHED RETINA LEFT EYE  . Left cataract extraction with lens implant 1993  . Coronary angioplasty with stent placement     3 procedures; 3 stents total  1994 & 1996  . Right cataract extraction with lens implant   1997  . Transurethral resection of prostate 01/21/2012    Procedure: TRANSURETHRAL RESECTION OF THE PROSTATE WITH GYRUS INSTRUMENTS;  Surgeon: [name], MD;  Location: [facility] ORS;  Service: Urology;  Laterality: N/A;  . Cystoscopy 01/28/2012    Procedure: CYSTOSCOPY;  Surgeon: [name], MD;  Location: [facility] ORS;  Service: Urology;  Laterality: N/A;  cystoscopy   . Hematoma evacuation 01/28/2012    Procedure: EVACUATION HEMATOMA;  Surgeon: [name], MD;  Location: [facility] ORS;  Service: Urology;  Laterality: N/A;   with fulgeration evacuation of clot  . Av fistula placement 02/08/2012    Procedure: ARTERIOVENOUS (AV) FISTULA CREATION;  Surgeon: Chuck Hint, MD;  Location: [facility] OR;  Service: Vascular;  Laterality: Left;  . Insertion of  dialysis catheter 02/08/2012    Procedure: INSERTION OF DIALYSIS CATHETER;  Surgeon: Chuck Hint, MD;  Location: [facility] OR;  Service: Vascular;  Laterality: Right;  Diatek exchange Right Internal Jugular    Related Meds:     . darbepoetin  100 mcg Intravenous Q7 days  . [COMPLETED] kidney failure book   Does not apply Once  . metoprolol tartrate  25 mg Oral BID  . multivitamin  1 tablet Oral QHS     Ht:  6'1" (185.4 cm)  Wt: 154 lb 1.6 oz (69.9 kg) (standing scales;  bedscales 70.8 kg)  Ideal Wt:    83.6 kg % Ideal Wt: 84  Usual Wt:  Wt Readings from Last 15 Encounters:  02/13/12 154 lb 1.6 oz (69.9 kg)    02/10/12 159 lb 13.3 oz (72.5 kg)  02/10/12 159 lb 13.3 oz (72.5 kg)  02/10/12 159 lb 13.3 oz (72.5 kg)  02/10/12 159 lb 13.3 oz (72.5 kg)  01/19/12 186 lb (84.369 kg)  01/12/12 186 lb (84.369 kg)  01/11/12 188 lb (85.276 kg)  01/01/12 185 lb 4.8 oz (84.052 kg)  12/16/11 180 lb 6.4 oz (81.829 kg)  12/09/11 157 lb (71.215 kg)  12/09/11 157 lb (71.215 kg)  11/26/11 177 lb 9.6 oz (80.559 kg)  11/25/11 174 lb 12.8 oz (79.289 kg)  10/12/11 183 lb (83.008 kg)  06/03/11 195 lb  % Usual Wt: 79  BMI:  20.3   Labs:  CMP     Component Value Date/Time   NA 136 02/13/2012 0731   NA 136 01/12/2012 1307   K 4.2 02/13/2012 0731   K 5.0 01/12/2012 1307   CL 99 02/13/2012 0731   CL 111* 01/12/2012 1307   CO2 26 02/13/2012 0731   CO2 18* 01/12/2012 1307   GLUCOSE 95 02/13/2012 0731   GLUCOSE 102* 01/12/2012 1307   BUN 38* 02/13/2012 0731   BUN 41.0* 01/12/2012 1307   CREATININE 7.91* 02/13/2012 0731   CREATININE 2.7* 01/12/2012 1307   CALCIUM 8.0* 02/13/2012 0731   CALCIUM 9.0 01/12/2012 1307   CALCIUM 11.0* 11/05/2011 1552   PROT 5.1* 02/01/2012 0815   PROT 6.5 01/12/2012 1307   ALBUMIN 2.3* 02/13/2012 0731   ALBUMIN 3.5 01/12/2012 1307   AST 52* 02/01/2012 0815   AST 26 01/12/2012 1307   ALT <5 02/01/2012 0815   ALT 37 01/12/2012 1307   ALKPHOS 80 02/01/2012 0815   ALKPHOS 73 01/12/2012 1307   BILITOT 0.6 02/01/2012 0815   BILITOT 2.20* 01/12/2012 1307   GFRNONAA 5* 02/13/2012 0731   GFRAA 6* 02/13/2012 0731    I/O last 3 completed shifts: In: 960 [P.O.:960] Out: 1993 [Urine:100; Other:1893]     Diet Order: Renal 80/90-2-2 with 1200 ml FR  Supplements/Tube Feeding:  Nepro bid prn  IVF:    Estimated Nutritional Needs:   Kcal: 2000-2100 Protein: 90-1000 Fluid: 1.2L FR  Food/Nutrition Related Hx: Pt sleeping upon multiple visits to room.  Refusing therapy and visits from staff today.  Recent weight loss related to fluid, decreased intake during hospitalization.   Consult received for diet ed with patient and wife, see pt for food choices and wishes to see the chef.  Pt is a retired Investment banker, operational.    Spoke with wife and educated her on the renal diet.  Written book with RD name and number provided.  Questions answered. Pt does not like Nepro secondary to causing loose bowel movements.  Likes Ensure Complete but this is high in potassium.  NUTRITION DIAGNOSIS: -Inadequate oral intake (NI-2.1).  Status: Ongoing  RELATED TO: decreased appetite  AS EVIDENCE BY: documentation  MONITORING/EVALUATION(Goals): Intake, labs, weight Intake of >/=90% estimated needs with meals and supplements  EDUCATION NEEDS: -Education not appropriate at this time  INTERVENTION: Educated patient on renal diet and address menu issues as appropriate.  Will discuss with Chef and have him visit.- spoke with Olene Floss who plans to see pt tomorrow.  Dietitian [phone]  DOCUMENTATION CODES Per approved criteria  -Not Applicable    Tracy [name] 02/14/2012, 11:10 AM

## 2012-02-14 NOTE — Progress Notes (Signed)
Informed by SW that pt not fully participating with rehab program.  I met with patient at bedside. Discussed the need to mobilize as much as possible and to be up out of bed in chair which would help with his nausea issues. Also discussed the need to fully participate as we discussed last week. If not able to participate, would need to look at his other options. I contacted his son, Tracy [name], in [place], and made him aware of pt's self limiting behaviors. Encouraged son to contact his Dad to explain the need for participation or that other rehab options would need to be considered such as home with home health or SNF rehab at a slower pace. Son expresses understanding and states he will contact his Dad to discuss. [phone]

## 2012-02-14 NOTE — Progress Notes (Signed)
OT Therapy Note   Pt missed 60 mins of am session secondary to being nauseas and vomiting.  Reported that he would not be able to participate secondary to not feeling well.  Perrin Maltese, OTR/L Pager number [phone] 02/14/2012

## 2012-02-14 NOTE — Progress Notes (Signed)
Subjective:   Stable, no complaints. L arm hurts a little where AVF was placed  Objective: Vital signs in last 24 hours: Temp:  [98.2 F (36.8 C)-98.7 F (37.1 C)] 98.2 F (36.8 C) (11/18 0500) Pulse Rate:  [92] 92  (11/17 1411) Resp:  [18] 18  (11/18 0500) BP: (103-127)/(52-72) 127/72 mmHg (11/18 0500) SpO2:  [98 %] 98 % (11/17 1411) Weight change: -3.6 kg (-7 lb 15 oz)  Intake/Output from previous day: 11/17 0701 - 11/18 0700 In: 840 [P.O.:840] Out: 1893  Intake/Output this shift:   EXAM: General appearance:  Alert, in no apparent distress Resp: clear bilat Cardio:  RRR without murmur or rub GI: BS, soft and nontender Extremities:  No edema LE's, 2+ edema and large ecchymoses over L upper and lower arm, +good bruit over LUA AVF, wound near [facility] fossa is edematous but no drainage. Access:  Right IJ catheter with BFR 300 cc/min, AVF @ LUA with + bruit  Lab Results:  Basename 02/13/12 0731  WBC 11.8*  HGB 8.3*  HCT 26.8*  PLT 91*   BMET:   Basename 02/13/12 0731  NA 136  K 4.2  CL 99  CO2 26  GLUCOSE 95  BUN 38*  CREATININE 7.91*  CALCIUM 8.0*  ALBUMIN 2.3*   No results found for this basename: PTH:2 in the last 72 hours Iron Studies: No results found for this basename: IRON,TIBC,TRANSFERRIN,FERRITIN in the last 72 hours   Assessment/Plan: 1. Acute on chronic renal failure - now on HD w ESRD (first HD on 11/9 with temporary catheter); received R IJ catheter with creation of AVF per Dr. Edilia Bo 11/12; K 3.8 on 11/14; tolerating well.  Outpatient HD will be on MWF @ [facility]. Will continue TTS schedule until discharge. 2. Volume overload - resolved, weight down from 91 > 70kg since starting HD.  3. HTN - was on metoprolol 50 bid at home, decreased to 25 bid here. Vol overload resolved with HD. BP's soft now, will not try to lower EDW further than 70-71 kg.  4. Anemia - Hgb 8.7, Fe sat 37% with ferritin 337 on 11/11; on Aranesp 100 mcg on Mon.  See 11  below 5. Secondary hyperparathyroidism - Ca 7.8 (9.1 corrected), P 2.9, iPTH 700 on 11/11; no Zemplar or binders.  6. Hx BPH - s/p TURP 10/25 per Dr. Vernie Ammons, with Foley. 7. Thrombocytopenia/hematuria - Plts 82, no Heparin with HD. 8. Hx CVA 9. CAD/Hx A-fib - s/p stent, HR controlled. 10. Deconditioning - began rehab 11/15. 11. Myelodysplastic syndrome   [name]  MD [facility] [phone] pgr    [phone] cell 02/14/2012, 1:48 PM

## 2012-02-14 NOTE — Progress Notes (Signed)
Physical Therapy Note  Patient Details  Name: Tracy [name] MRN: [id number] Date of Birth: 12/04/1919 Today's Date: 02/14/2012  Time: 900-955 55 minutes  No c/o pain.  Treatment session focused on improving activity tolerance through short distance gait, standing therex and functional transfers.  Pt able to gait 10-20' before c/o fatigue and need to sit.  Pt performed multiple attempts with min A to close supervision.  Sit to stand training multiple reps with cues for hand placement each time, supervision, requires frequent rests during this activity.  Standing therex marching, hip abd, mini squats, heel raises 2 x 10 B with frequent rest.  Pt's O2 sats stayed >93% throughout session on room air.  Pt states his main goal is to improve walking and get stronger.  Limited by decreased activity tolerance throughout session.  Individual therapy   [name] 02/14/2012, 9:52 AM

## 2012-02-14 NOTE — Progress Notes (Signed)
Patient information reviewed and entered into eRehab system by Dianna Ewald, RN, CRRN, PPS Coordinator.  Information including medical coding and functional independence measure will be reviewed and updated through discharge.     Per nursing patient was given "Data Collection Information Summary for Patients in Inpatient Rehabilitation Facilities with attached "Privacy Act Statement-Health Care Records" upon admission.  

## 2012-02-14 NOTE — Progress Notes (Signed)
[facility] Individual Statement of Services  Patient Name:  Tracy [name]  Date:  02/14/2012  Welcome to the [facility].  Our goal is to provide you with an individualized program based on your diagnosis and situation, designed to meet your specific needs.  With this comprehensive rehabilitation program, you will be expected to participate in at least 3 hours of rehabilitation therapies Monday-Friday, with modified therapy programming on the weekends.  Your rehabilitation program will include the following services:  Physical Therapy (PT), Occupational Therapy (OT), 24 hour per day rehabilitation nursing, Therapeutic Recreaction (TR), Case Management (Social Worker), Rehabilitation Medicine, Nutrition Services and Pharmacy Services  Weekly team conferences will be held on Tuesdays to discuss your progress.  Your  Social Worker will talk with you frequently to get your input and to update you on team discussions.  Team conferences with you and your family in attendance may also be held.  Expected length of stay: 10 days  Overall anticipated outcome: supervision to minimal assistance  Depending on your progress and recovery, your program may change.  Your  Social Worker will coordinate services and will keep you informed of any changes.  Your  Social Worker's name and contact numbers are listed  below.  The following services may also be recommended but are not provided by the [facility]:   Driving Evaluations  Home Health Rehabiltiation Services  Outpatient Rehabilitatation [facility]  Vocational Rehabilitation   Arrangements will be made to provide these services after discharge if needed.  Arrangements include referral to agencies that provide these services.  Your insurance has been verified to be:  Medicare and AARP Your primary doctor is:  Dr. Marjory Lies  Pertinent information will be shared with your doctor and your  insurance company.  Social Worker:  [place], [place] [phone] or ([phone]  Information discussed with and copy given to patient by: Amada Jupiter, 02/14/2012, 4:24 PM

## 2012-02-14 NOTE — Progress Notes (Signed)
Physical Therapy Note  Patient Details  Name: Tracy [name] MRN: [id number] Date of Birth: [birth year] Today's Date: 02/14/2012  Time: 1400-1410  Pt refused PT on therapist's arrival stating "I had enough of that this morning".  Pt educated on benefits of therapy and getting out of bed, pt continued to refuse.  PT assisted pt to don compression hoes and educated pt on the importance of wearing them when he is up.  Pt expressed understanding but continued to refuse any in bed or out of bed activity.  Missed 35 minutes skilled PT.  Individual therapy   Shadia Larose 02/14/2012, 2:49 PM

## 2012-02-14 NOTE — Progress Notes (Signed)
Social Work Patient ID: Tracy [name], male   DOB: [birth year], 78 y.o.   MRN: [id number]  Attempted to meet with patient to complete initial psychosocial assessment.  Pt sitting up in recliner and, upon my entry, states, "Oh no. Not again. Another person coming in to bother me." Explained my role, however, pt declined interview at this time and asked that I come back "... In an hour" and closes his eyes.  Will attempt again this afternoon.  [name]

## 2012-02-14 NOTE — Progress Notes (Signed)
Social Work  Social Work Assessment and Plan  Patient Details  Name: Tracy [name] MRN: [id number] Date of Birth: Oct 30, 1919  Today's Date: 02/14/2012  Problem List:  Patient Active Problem List  Diagnosis  . HYPERLIPIDEMIA  . Essential hypertension, benign  . CAD  . CARDIOMYOPATHY  . PREMATURE VENTRICULAR CONTRACTIONS  . BRADYCARDIA  . CAROTID STENOSIS  . RENAL INSUFFICIENCY  . SYNCOPE  . DYSPNEA ON EXERTION  . NSTEMI (non-ST elevated myocardial infarction)  . Atrial fibrillation  . Encounter for long-term (current) use of anticoagulants  . Chronic diastolic heart failure  . Pancytopenia  . Pulmonary fibrosis  . Pulmonary nodule seen on imaging study  . Epistaxis  . Hypercalcemia  . CKD (chronic kidney disease), stage IV  . Cachexia  . MDS (myelodysplastic syndrome)  . Anemia associated with acute blood loss  . Warfarin-induced coagulopathy  . Hyperkalemia  . Elevated brain natriuretic peptide (BNP) level  . Chronic combined systolic and diastolic CHF (congestive heart failure)  . Acute-on-chronic renal failure  . Physical deconditioning   Past Medical History:  Past Medical History  Diagnosis Date  . CAD (coronary artery disease)     status post prior PCI to the obtuse marginal in 1997 and stenting to the mid to distal RCA in 2001; LHC 9/06:  LM ok, pLAD 50%, mLAD 60% then 60-70%, mRI 50%, mOM1 80-90% (small), pRCA and dRCA stents ok, PDA 50%, EF 65%;  Myoview 5/12: No ischemia, EF 64%.;  NSTEMI 11/12 tx medically   . Chronic kidney disease, stage III (moderate)   . TIA (transient ischemic attack)   . HTN (hypertension)   . Hyperlipidemia   . AF (atrial fibrillation) 02/16/11    Coumadin ==> patient decided to stop after admxn with worsening anemia in setting of UGI bleed, epistaxis  . Arthritis     "in my left ankle"  . Chronic diastolic heart failure   . Carotid stenosis     dopplers 03/2011: 0-39% bilateral  . MDS (myelodysplastic syndrome) 12/16/2011  . Hx  of echocardiogram     Echocardiogram 02/17/11: Severe LVH, asymmetric hypertrophy, EF 50-55%, normal wall motion, high ventricular filling pressure, mild LAE, mild RVE, mildly reduced RVSF, mild RAE.  Marland Kitchen Myocardial infarction 2012  . Stroke     5 yrs ago.  Mini - NO RESIDUAL PROBLEMS  . Anemia of chronic disease     RECENT HOSPITALIZATION / BLOOD TRANSFUSION OCT 2013  . Shortness of breath     WITH ANY ACTIVITY---  NO OXYGEN  . CHF (congestive heart failure)     CHRONIC DIASTOLIC HEART FAILURE  . Hydrocele, right    Past Surgical History:  Past Surgical History  Procedure Date  . Nose surgery 1986    deviated septum  . Total knee arthroplasty 2009    left  . Joint replacement 2009    left knee  . Surgery scrotal / testicular 197O    CORRECTIVE SURGERY TO VARICOCELE (SWELLING OF SCROTUM)  . Esophagogastroduodenoscopy 12/09/2011    Procedure: ESOPHAGOGASTRODUODENOSCOPY (EGD);  Surgeon: Willis Modena, MD;  Location: [facility] ENDOSCOPY;  Service: Endoscopy;  Laterality: N/A;  outlaw/ebp  . Tonsillectomy and adenoidectomy 1929  . Appendectomy 1930's  . Left ear mastoid surgery 1933  . Removal of one testicle 1944  . Correction to botched varicocele surgery 1970  . Eye surgery 1993    SURGERY FOR DETACHED RETINA LEFT EYE  . Left cataract extraction with lens implant 1993  . Coronary angioplasty with stent  placement     3 procedures; 3 stents total  1994 & 1996  . Right cataract extraction with lens implant   1997  . Transurethral resection of prostate 01/21/2012    Procedure: TRANSURETHRAL RESECTION OF THE PROSTATE WITH GYRUS INSTRUMENTS;  Surgeon: [name], MD;  Location: [facility] ORS;  Service: Urology;  Laterality: N/A;  . Cystoscopy 01/28/2012    Procedure: CYSTOSCOPY;  Surgeon: [name], MD;  Location: [facility] ORS;  Service: Urology;  Laterality: N/A;  cystoscopy   . Hematoma evacuation 01/28/2012    Procedure: EVACUATION HEMATOMA;  Surgeon: [name], MD;  Location: [facility] ORS;   Service: Urology;  Laterality: N/A;   with fulgeration evacuation of clot  . Av fistula placement 02/08/2012    Procedure: ARTERIOVENOUS (AV) FISTULA CREATION;  Surgeon: Chuck Hint, MD;  Location: [facility] OR;  Service: Vascular;  Laterality: Left;  . Insertion of dialysis catheter 02/08/2012    Procedure: INSERTION OF DIALYSIS CATHETER;  Surgeon: Chuck Hint, MD;  Location: [facility] OR;  Service: Vascular;  Laterality: Right;  Diatek exchange Right Internal Jugular   Social History:  reports that he quit smoking about 33 years ago. His smoking use included Cigarettes and Pipe. He has a 80 pack-year smoking history. He has never used smokeless tobacco. He reports that he drinks alcohol. He reports that he does not use illicit drugs.  Family / Support Systems Marital Status: Married How Long?: 18 years (2nd marriage for both) Patient Roles: Spouse;Parent Spouse/Significant Other: wife, Alin Chavira @ [phone] or ([phone] Children: Pt's daughter, Joseantonio Dittmar Poole, [place]) @ (C) [phone]  and son, Thedford Bunton @ [phone] Anticipated Caregiver: wife Ability/Limitations of Caregiver: supervision to min assist Caregiver Availability: 24/7 Family Dynamics: wife very supportive and involved - no concerns at this point.  Wife has good relationship with pt's children.  Social History Preferred language: English Religion: Episcopalian Cultural Background: NA Education: college Read: Yes Write: Yes Employment Status: Retired Fish farm manager Issues: none Guardian/Conservator: none   Abuse/Neglect Physical Abuse: Denies Verbal Abuse: Denies Sexual Abuse: Denies Exploitation of patient/patient's resources: Denies Self-Neglect: Denies  Emotional Status Pt's affect, behavior adn adjustment status: Pt declined SW interview at first attempt.  Initially agreeable with 2nd attempt, however, once I began with interview questions, pt declined and requested  that I speak with his wife "...because I can't hear you and she's the one to talk to..." Pt  then complains about "...another person coming in to bug me."  Wife aware of this response from pt and plans "... to speak to him when I come back....my apologies to all of you trying to work with him." Recent Psychosocial Issues: None Pyschiatric History: None Substance Abuse History: None  Patient / Family Perceptions, Expectations & Goals Pt/Family understanding of illness & functional limitations: Not able to engage pt enough to assess his understanding of medical issues.  Wife able to give detailed account of multiple medical issues since admission and of his current functional limitations. Premorbid pt/family roles/activities: Wife reports that pt was independent overall PTA, but limited with community level activities (couple x per week) Anticipated changes in roles/activities/participation: little change anticipated except possible increase in physical assistance needs dependent on how much participation we get from pt  Pt/family expectations/goals: Wife hopeful he can reach at least a minimal assistance level.  Community Resources [employer]: None Premorbid Home Care/DME Agencies: None Transportation available at discharge: yes  Discharge Planning Living Arrangements: Spouse/significant other Support  Systems: Spouse/significant other Type of Residence: Private residence Insurance Resources: Administrator (specify) Building services engineer) Financial Resources: Restaurant manager, fast food Screen Referred: No Living Expenses: Own Money Management: Spouse Do you have any problems obtaining your medications?: No Home Management: wife primarily Patient/Family Preliminary Plans: Pt plans to return home with wife to assume primary caregiver role Barriers to Discharge: Other (Comment) (pt's own participation on CIR - will/ won't meet goals?) Social Work Anticipated Follow Up Needs:  HH/OP Expected length of stay: ELOS 7- 10 days  Clinical Impression Elderly gentleman lying in bed and complaining about multiple staff coming into room. Not agreeable to completing interview with this SW and insists that I gather all needed information from his wife.  Wife aware of his refusal of two therapies today.  Concern that if pt does not participate, then we will not reach anticipated goals.  Will follow closely to help with this in any way.  Jasia Hiltunen 02/14/2012, 4:21 PM

## 2012-02-14 NOTE — Progress Notes (Signed)
Occupational Therapy Session Note  Patient Details  Name: GREENE DIODATO MRN: [id number] Date of Birth: 1919/08/19  Today's Date: 02/14/2012 Time: 4098-1191 Time Calculation (min): 32 min  Short Term Goals: Week 1:  OT Short Term Goal 1 (Week 1): Pt. will do bathing at min a level at sink with sit to stand OT Short Term Goal 2 (Week 1): Pt. will dress lower body at min assist level OT Short Term Goal 3 (Week 1): Pt. will perfrom toileting at minimal assist level OT Short Term Goal 4 (Week 1): Pt. will do toilet transfer at min assist level  Skilled Therapeutic Interventions/Progress Updates:    Practice tub shower transfers using the RW and tub shower bench.  Overall needed min assist for stand pivot transfer with mod instructional cueing for hand placement and not pulling up on the walker.  During transfer to the tub bench pt did not turn and align himself with the seat before sitting and almost fell off of the bench.  Therapist had to help him regain his balance.  Discussed use of a tub bench to also help with energy conservation as well.  He was only able to tolerate short distance mobility to get into and out of the bathroom with his RW.  Therapy Documentation Precautions:  Precautions Precautions: Fall Precaution Comments: Monitor vitals Restrictions Weight Bearing Restrictions: No General: General Missed Time Reason: Patient unwilling/refused to participate without medical reason Vital Signs:  Pain: Pain Assessment Pain Assessment: No/denies pain  See FIM for current functional status  Therapy/Group: Individual Therapy  Lucinda Spells OTR/L 02/14/2012, 3:40 PM

## 2012-02-14 NOTE — Progress Notes (Signed)
Patient ID: Tracy [name], male   DOB: October 05, 1919, 78 y.o.   MRN: [id number] 78 y.o. male with hx of CKD stage IV (2.5-3.0), HTN, TIA/CVA, L TKR, CAD w stents in place, afib, diast HF and Myelodysplastic syndrome, recent admission 12/10/11 for ABLA due to GIB as well as urethral bleeding. He was admitted on 01/21/12 for TURP (for BPH with outlet obstruction) by Dr. Vernie Ammons. Post-op had onset of gross hematuria treated with Amicar, bladder irrigation and on 11/1 he had cystoscopy with clot evacuation. Hospital course complicated by ABLA requiring multiple units of PRBC and platelets, syncope with dizziness and lethargy due to anemia and electrolyte abnormality. He developed acute on chronic renal failure with uremic symptoms and fluid overload requiring initiation of HD. Left AV fistula and R-IJ cath placed by Dr. Edilia Bo on 02/08/12. He contiues to have hematuria and foley to remain in place till urine clears prior to initiating voiding trial per urology. Therapies initiated and patient noted to be deconditioned with unsteady gait  Subjective/Complaints: Numerous complaints about diet, the way food is prepared, soggy toast, etc.   Review of Systems  Genitourinary: Positive for hematuria.  Neurological: Positive for weakness.  All other systems reviewed and are negative.    Objective: Vital Signs: Blood pressure 127/72, pulse 92, temperature 98.2 F (36.8 C), temperature source Oral, resp. rate 18, weight 69.9 kg (154 lb 1.6 oz), SpO2 98.00%. No results found. Results for orders placed during the hospital encounter of 02/11/12 (from the past 72 hour(s))  RENAL FUNCTION PANEL     Status: Abnormal   Collection Time   02/13/12  7:31 AM      Component Value Range Comment   Sodium 136  135 - 145 mEq/L    Potassium 4.2  3.5 - 5.1 mEq/L    Chloride 99  96 - 112 mEq/L    CO2 26  19 - 32 mEq/L    Glucose, Bld 95  70 - 99 mg/dL    BUN 38 (*) 6 - 23 mg/dL    Creatinine, Ser 4.09 (*) 0.50 - 1.35 mg/dL      Calcium 8.0 (*) 8.4 - 10.5 mg/dL    Phosphorus 4.6  2.3 - 4.6 mg/dL    Albumin 2.3 (*) 3.5 - 5.2 g/dL    GFR calc non Af Amer 5 (*) >90 mL/min    GFR calc Af Amer 6 (*) >90 mL/min   CBC     Status: Abnormal   Collection Time   02/13/12  7:31 AM      Component Value Range Comment   WBC 11.8 (*) 4.0 - 10.5 K/uL    RBC 2.90 (*) 4.22 - 5.81 MIL/uL    Hemoglobin 8.3 (*) 13.0 - 17.0 g/dL    HCT 81.1 (*) 91.4 - 52.0 %    MCV 92.4  78.0 - 100.0 fL    MCH 28.6  26.0 - 34.0 pg    MCHC 31.0  30.0 - 36.0 g/dL    RDW 78.2 (*) 95.6 - 15.5 %    Platelets 91 (*) 150 - 400 K/uL CONSISTENT WITH PREVIOUS RESULT     HEENT: normal Cardio: irregular Resp: CTA B/L GI: BS positive Extremity:  No Edema.   Skin:   Wound redness L antecubital fossa, red swollen, improved overall.  Neuro: Alert/Oriented. Moves all 4's Musc/Skel:  Swelling L elbow op site Gen NAD Urine red but clearing   Assessment/Plan: 1. Functional deficits secondary to deconditioning acute on chronic renal failure,  anemia due to hematuria which require 3+ hours per day of interdisciplinary therapy in a comprehensive inpatient rehab setting. Physiatrist is providing close team supervision and 24 hour management of active medical problems listed below. Physiatrist and rehab team continue to assess barriers to discharge/monitor patient progress toward functional and medical goals. FIM: FIM - Bathing Bathing: 0: Activity did not occur  FIM - Upper Body Dressing/Undressing Upper body dressing/undressing: 0: Wears gown/pajamas-no public clothing FIM - Lower Body Dressing/Undressing Lower body dressing/undressing: 0: Wears gown/pajamas-no public clothing  FIM - Toileting Toileting steps completed by patient: Adjust clothing after toileting;Adjust clothing prior to toileting Toileting: 3: Mod-Patient completed 2 of 3 steps  FIM - Diplomatic Services operational officer Devices: Elevated toilet seat;Walker Toilet Transfers:  3-To toilet/BSC: Mod A (lift or lower assist);3-From toilet/BSC: Mod A (lift or lower assist)  FIM - Banker Devices: Walker;Bed rails Bed/Chair Transfer: 4: Supine > Sit: Min A (steadying Pt. > 75%/lift 1 leg);3: Bed > Chair or W/C: Mod A (lift or lower assist);3: Chair or W/C > Bed: Mod A (lift or lower assist)  FIM - Locomotion: Wheelchair Distance: 40 Locomotion: Wheelchair: 1: Travels less than 50 ft with minimal assistance (Pt.>75%) FIM - Locomotion: Ambulation Locomotion: Ambulation Assistive Devices: Parallel bars (Pt fatigued this afternoon) Ambulation/Gait Assistance: 3: Mod assist Locomotion: Ambulation: 1: Travels less than 50 ft with moderate assistance (Pt: 50 - 74%)  Comprehension Comprehension Mode: Auditory Comprehension: 5-Follows basic conversation/direction: With extra time/assistive device  Expression Expression Mode: Verbal Expression: 5-Expresses basic needs/ideas: With no assist  Social Interaction Social Interaction: 5-Interacts appropriately 90% of the time - Needs monitoring or encouragement for participation or interaction.  Problem Solving Problem Solving: 5-Solves basic 90% of the time/requires cueing < 10% of the time  Memory Memory: 4-Recognizes or recalls 75 - 89% of the time/requires cueing 10 - 24% of the time  Medical Problem List and Plan:  1. DVT Prophylaxis/Anticoagulation: Mechanical: Sequential compression devices, below knee Bilateral lower extremities  2. Pain Management: prn hydrocodone effective.  3. Mood: tired of being in the hospital and now diet restrictions. LCSW to follow up for formal evaluation. Offer ego support.  4. Neuropsych: This patient is capable of making decisions on his/her own behalf.  5. Acute on chronic renal failure: Now HD dependent. Continue HD T, T, S. Daily weights. Renal diet.   -will ask RD for help with food choices 6. Atrial fibrillation: Monitor HR with bid checks.  Continue metoprolol. Off coumadin due to recent bleeding events.  7. Mixed CHF with pleural effusion: Appears to be compensated on dialysis and FR. Continue daily weights.  8. Acute on chronic anemia: On aranesp weekly. Continue to monitor H/H with HD. Has been relatively stable despite ongoing urethral bleeding.  9. MDS: likely contributing to thrombocytopenia. Platelets on slow upward trend 58---> 82.  10. BPH s/p TURP: Foley to continue due to continue hematuria. Wife is concerned, but encouraged her that this will be monitored closely along with hgb. Flush catheter as needed to ensure patency    LOS (Days) 3 A FACE TO FACE EVALUATION WAS PERFORMED  SWARTZ,ZACHARY T 02/14/2012, 7:50 AM

## 2012-02-15 ENCOUNTER — Inpatient Hospital Stay (HOSPITAL_COMMUNITY): Payer: Medicare Other | Admitting: Physical Therapy

## 2012-02-15 ENCOUNTER — Encounter (HOSPITAL_COMMUNITY): Payer: Medicare Other

## 2012-02-15 ENCOUNTER — Encounter (HOSPITAL_COMMUNITY): Payer: Medicare Other | Admitting: Occupational Therapy

## 2012-02-15 MED ORDER — LIDOCAINE HCL (PF) 1 % IJ SOLN: 5.0000 mL | INTRAMUSCULAR | Status: DC | PRN

## 2012-02-15 MED ORDER — NEPRO/CARBSTEADY PO LIQD
237.0000 mL | ORAL | Status: DC | PRN
  Filled 2012-02-15: qty 237

## 2012-02-15 MED ORDER — LIDOCAINE-PRILOCAINE 2.5-2.5 % EX CREA: 1.0000 "application " | TOPICAL_CREAM | CUTANEOUS | Status: DC | PRN

## 2012-02-15 MED ORDER — DARBEPOETIN ALFA-POLYSORBATE 60 MCG/0.3ML IJ SOLN
100.0000 ug | INTRAMUSCULAR | Status: DC
  Administered 2012-02-17: 100 ug via INTRAVENOUS
  Filled 2012-02-15: qty 0.6

## 2012-02-15 MED ORDER — HEPARIN SODIUM (PORCINE) 1000 UNIT/ML DIALYSIS
1000.0000 [IU] | INTRAMUSCULAR | Status: DC | PRN
  Administered 2012-02-15: 4600 [IU] via INTRAVENOUS_CENTRAL

## 2012-02-15 MED ORDER — ALTEPLASE 2 MG IJ SOLR: 2.0000 mg | Freq: Once | INTRAMUSCULAR | Status: DC | PRN

## 2012-02-15 MED ORDER — SODIUM CHLORIDE 0.9 % IV SOLN: 100.0000 mL | INTRAVENOUS | Status: DC | PRN

## 2012-02-15 MED ORDER — BOOST / RESOURCE BREEZE PO LIQD
1.0000 | Freq: Two times a day (BID) | ORAL | Status: DC
  Administered 2012-02-16 – 2012-02-17 (×2): 1 via ORAL

## 2012-02-15 MED ORDER — PENTAFLUOROPROP-TETRAFLUOROETH EX AERO: 1.0000 "application " | INHALATION_SPRAY | CUTANEOUS | Status: DC | PRN

## 2012-02-15 NOTE — Progress Notes (Signed)
Nutrition Note  Diet Renal 80/90-2-2 1200 ml Fluid Restriction  Nepro bid  Spoke with patient and wife.  Patient has been reviewing book on renal diet provided yesterday to wife.  Discussed problems with meal service and areas to improve.  Pt met with Olene Floss who will prepare him a special lunch tomorrow.  Pt for dialysis tonight.  Dislikes Nepro secondary to effects on bowels.    Intervention:  Change Nepro to [employer] bid  Obtained preferences and will provide  Continue to work with Chef for better meal satisfaction  Manager check for meal accuracy  Will monitor closely.  Please consult as needed.  Oran Rein, RD, LDN Clinical Inpatient Dietitian Pager:  [phone] Weekend and after hours pager:  [phone]

## 2012-02-15 NOTE — Progress Notes (Signed)
Occupational Therapy Session Note  Patient Details  Name: NAYIB REMER MRN: [id number] Date of Birth: 1919-05-17  Today's Date: 02/15/2012 Time: 4098-1191 Time Calculation (min): 45 min  Short Term Goals: Week 1:  OT Short Term Goal 1 (Week 1): Pt. will do bathing at min a level at sink with sit to stand OT Short Term Goal 2 (Week 1): Pt. will dress lower body at min assist level OT Short Term Goal 3 (Week 1): Pt. will perfrom toileting at minimal assist level OT Short Term Goal 4 (Week 1): Pt. will do toilet transfer at min assist level  Skilled Therapeutic Interventions/Progress Updates:    focus on sit to stands, functional ambulation with RW, activity tolerance.  BP 123/54 sitting (with rest) after ambulation; 101/45 with standing with dizziness and LOB to right with ambulation. Did beginning parts of BERG - unable to tolerate the entire balance test. Pt with significant use of hands with transfers and sit to stands. Nu step for 5 min with BORG exertion score of 13 (his perception of exerting energy).  Therapy Documentation Precautions:  Precautions Precautions: Fall Precaution Comments: Monitor vitals Restrictions Weight Bearing Restrictions: No    Vital Signs:     Pain:  no c/o pain  See FIM for current functional status  Therapy/Group: Individual Therapy  Roney Mans [facility] 02/15/2012, 2:42 PM

## 2012-02-15 NOTE — Progress Notes (Signed)
Patient ID: Tracy [name], male   DOB: May 24, 1919, 78 y.o.   MRN: [id number]    Subjective/Complaints: Still unhappy with diet. Not being brought requested food items.  Review of Systems  Genitourinary: Positive for hematuria.  Neurological: Positive for weakness.  All other systems reviewed and are negative.    Objective: Vital Signs: Blood pressure 87/47, pulse 87, temperature 98.8 F (37.1 C), temperature source Oral, resp. rate 16, weight 69.673 kg (153 lb 9.6 oz), SpO2 99.00%. No results found. Results for orders placed during the hospital encounter of 02/11/12 (from the past 72 hour(s))  RENAL FUNCTION PANEL     Status: Abnormal   Collection Time   02/13/12  7:31 AM      Component Value Range Comment   Sodium 136  135 - 145 mEq/L    Potassium 4.2  3.5 - 5.1 mEq/L    Chloride 99  96 - 112 mEq/L    CO2 26  19 - 32 mEq/L    Glucose, Bld 95  70 - 99 mg/dL    BUN 38 (*) 6 - 23 mg/dL    Creatinine, Ser 4.09 (*) 0.50 - 1.35 mg/dL    Calcium 8.0 (*) 8.4 - 10.5 mg/dL    Phosphorus 4.6  2.3 - 4.6 mg/dL    Albumin 2.3 (*) 3.5 - 5.2 g/dL    GFR calc non Af Amer 5 (*) >90 mL/min    GFR calc Af Amer 6 (*) >90 mL/min   CBC     Status: Abnormal   Collection Time   02/13/12  7:31 AM      Component Value Range Comment   WBC 11.8 (*) 4.0 - 10.5 K/uL    RBC 2.90 (*) 4.22 - 5.81 MIL/uL    Hemoglobin 8.3 (*) 13.0 - 17.0 g/dL    HCT 81.1 (*) 91.4 - 52.0 %    MCV 92.4  78.0 - 100.0 fL    MCH 28.6  26.0 - 34.0 pg    MCHC 31.0  30.0 - 36.0 g/dL    RDW 78.2 (*) 95.6 - 15.5 %    Platelets 91 (*) 150 - 400 K/uL CONSISTENT WITH PREVIOUS RESULT     HEENT: normal Cardio: irregular Resp: CTA B/L GI: BS positive Extremity:  No Edema.   Skin:   Wound redness L antecubital fossa, with bruising but swelling and tenderness much improved. Neuro: Alert/Oriented. Moves all 4's. Cognitively intact. Musc/Skel:  Swelling L elbow op site Gen NAD Urine red but non-cloudy   Assessment/Plan: 1.  Functional deficits secondary to deconditioning acute on chronic renal failure, anemia due to hematuria which require 3+ hours per day of interdisciplinary therapy in a comprehensive inpatient rehab setting. Physiatrist is providing close team supervision and 24 hour management of active medical problems listed below. Physiatrist and rehab team continue to assess barriers to discharge/monitor patient progress toward functional and medical goals. FIM: FIM - Bathing Bathing: 0: Activity did not occur  FIM - Upper Body Dressing/Undressing Upper body dressing/undressing: 0: Wears gown/pajamas-no public clothing FIM - Lower Body Dressing/Undressing Lower body dressing/undressing: 0: Wears gown/pajamas-no public clothing  FIM - Toileting Toileting steps completed by patient: Adjust clothing after toileting;Adjust clothing prior to toileting Toileting: 3: Mod-Patient completed 2 of 3 steps  FIM - Diplomatic Services operational officer Devices: Elevated toilet seat;Walker Toilet Transfers: 3-To toilet/BSC: Mod A (lift or lower assist);3-From toilet/BSC: Mod A (lift or lower assist)  FIM - Banker Devices: Manufacturing systems engineer  Transfer: 5: Supine > Sit: Supervision (verbal cues/safety issues);4: Bed > Chair or W/C: Min A (steadying Pt. > 75%);4: Chair or W/C > Bed: Min A (steadying Pt. > 75%)  FIM - Locomotion: Wheelchair Distance: 40 Locomotion: Wheelchair: 1: Travels less than 50 ft with minimal assistance (Pt.>75%) FIM - Locomotion: Ambulation Locomotion: Ambulation Assistive Devices: Parallel bars (Pt fatigued this afternoon) Ambulation/Gait Assistance: 3: Mod assist Locomotion: Ambulation: 1: Travels less than 50 ft with minimal assistance (Pt.>75%)  Comprehension Comprehension Mode: Auditory Comprehension: 5-Follows basic conversation/direction: With extra time/assistive device  Expression Expression Mode: Verbal Expression: 5-Expresses  basic needs/ideas: With no assist  Social Interaction Social Interaction: 5-Interacts appropriately 90% of the time - Needs monitoring or encouragement for participation or interaction.  Problem Solving Problem Solving: 5-Solves basic 90% of the time/requires cueing < 10% of the time  Memory Memory: 4-Recognizes or recalls 75 - 89% of the time/requires cueing 10 - 24% of the time  Medical Problem List and Plan:  1. DVT Prophylaxis/Anticoagulation: Mechanical: Sequential compression devices, below knee Bilateral lower extremities  2. Pain Management: prn hydrocodone effective.  3. Mood: tired of being in the hospital and now diet restrictions. LCSW to follow up for formal evaluation. Offer ego support.  4. Neuropsych: This patient is capable of making decisions on his/her own behalf.  5. Acute on chronic renal failure: Now HD dependent. Continue HD T, T, S. Daily weights. Renal diet.   - RD assistance with food choices appreciated 6. Atrial fibrillation: Monitor HR with bid checks. Continue metoprolol. Off coumadin due to recent bleeding events.  7. Mixed CHF with pleural effusion: Appears to be compensated on dialysis and FR. Continue daily weights.  8. Acute on chronic anemia: On aranesp weekly. Continue to monitor H/H with HD. Has been relatively stable despite ongoing urethral bleeding.  9. MDS: likely contributing to thrombocytopenia. Platelets on slow upward trend 58---> 82.  10. BPH s/p TURP: Foley to continue due to continue hematuria.  Hematuria present. Blood counts are increasing   LOS (Days) 4 A FACE TO FACE EVALUATION WAS PERFORMED  Tracy [name] T 02/15/2012, 7:49 AM

## 2012-02-15 NOTE — Progress Notes (Addendum)
Pt had episode of vomiting this am after transferring to recliner from bed. B/P 100/68,. C/o nausea which subsided after episode. At 1300 pt assisted to BR, c/o nausea, "mild" dizziness after standing. Orthostatic b/ps checked;  Standing 86/48- 76; sitting 86/54-72; lying 102/62-72. Marissa Nestle PAC aware. THTs and binder obtained. Monitor. Foley patent, hematuria. Carlean Purl

## 2012-02-15 NOTE — Progress Notes (Signed)
Physical Therapy Note  Patient Details  Name: GERHARD RAPPAPORT MRN: [id number] Date of Birth: [birth year] Today's Date: 02/15/2012  Time: 1520-1547 27 minutes  No c/o pain.  Treatment focused on dynamic gait training with RW, direction changes, speed changes, obstacle negotiation.  Pt required min A for 2 LOB during treatment.  Pt required min-mod cuing for safety with RW.  Pt able to maneuver on carpet and tile surfaces with close supervision-min A.  Pt required frequent seated and standing rest breaks due to low activity tolerance, no reports of dizziness or nausea during session.  Individual therapy   Zaara Sprowl 02/15/2012, 3:48 PM

## 2012-02-15 NOTE — Progress Notes (Signed)
Physical Therapy Note  Patient Details  Name: Tracy [name] MRN: [id number] Date of Birth: [birth year] Today's Date: 02/15/2012  Time: 1000-1058 58 minutes  No c/o pain.  Treatment focused on increasing activity tolerance through gait, exercise and functional activities.  Pt able to increase gait distance > 50' x 4 with prolonged rest breaks in between.  Pt with no c/o dizziness throughout session, BP 101/57.  Stair training 10 stairs B handrails with supervision, 10 stairs with 1 handrail supervision.  Ramp/curb training with close supervision-min A for safety, pt tends to decrease safety awareness when fatigued.  Standing therex 30x hip abd, HS curl, marching, heel raises, mini squats.  Pt requires prolonged rest between each exercise.  Improved participation in treatment today.  Individual therapy   DONAWERTH,KAREN 02/15/2012, 11:00 AM

## 2012-02-15 NOTE — Progress Notes (Signed)
Occupational Therapy Session Note  Patient Details  Name: Tracy [name] MRN: [id number] Date of Birth: 12-30-1919  Today's Date: 02/15/2012 Time: 4098-1191 Time Calculation (min): 60 min  Short Term Goals: Week 1:  OT Short Term Goal 1 (Week 1): Pt. will do bathing at min a level at sink with sit to stand OT Short Term Goal 2 (Week 1): Pt. will dress lower body at min assist level OT Short Term Goal 3 (Week 1): Pt. will perfrom toileting at minimal assist level OT Short Term Goal 4 (Week 1): Pt. will do toilet transfer at min assist level  Skilled Therapeutic Interventions/Progress Updates:    1:1 self care retraining focus on bathing and dressing at sink (once setup), bed mobility, short distance functional ambulation with RW around the circle. Pt displayed LOB to the right on carpet needing min A to regain balance (demonstrating delayed balance reactions).  Pt required multiple rest breaks in session - Wore TEDS hose and no c/o dizziness or nausea.   Therapy Documentation Precautions:  Precautions Precautions: Fall Precaution Comments: Monitor vitals Restrictions Weight Bearing Restrictions: No Pain:  no c/o pain  See FIM for current functional status  Therapy/Group: Individual Therapy  Roney Mans [facility] 02/15/2012, 9:49 AM

## 2012-02-16 ENCOUNTER — Inpatient Hospital Stay (HOSPITAL_COMMUNITY): Payer: Medicare Other | Admitting: Physical Therapy

## 2012-02-16 ENCOUNTER — Ambulatory Visit: Payer: Medicare Other | Admitting: Cardiology

## 2012-02-16 ENCOUNTER — Inpatient Hospital Stay (HOSPITAL_COMMUNITY): Payer: Medicare Other

## 2012-02-16 ENCOUNTER — Inpatient Hospital Stay (HOSPITAL_COMMUNITY): Payer: Medicare Other | Admitting: Occupational Therapy

## 2012-02-16 DIAGNOSIS — N19 Unspecified kidney failure: Secondary | ICD-10-CM

## 2012-02-16 DIAGNOSIS — R5381 Other malaise: Secondary | ICD-10-CM

## 2012-02-16 DIAGNOSIS — R339 Retention of urine, unspecified: Secondary | ICD-10-CM

## 2012-02-16 NOTE — Patient Care Conference (Signed)
Inpatient RehabilitationTeam Conference Note Date: 02/15/2012   Time: 3:05 PM    Patient Name: Tracy [name]      Medical Record Number: [id number]  Date of Birth: Aug 23, 1919 Sex: Male         Room/Bed: 4006/4006-01 Payor Info: Payor: MEDICARE  Plan: MEDICARE PART A AND B  Product Type: *No Product type*     Admitting Diagnosis: Deconditioned  Admit Date/Time:  02/11/2012  2:02 PM Admission Comments: No comment available   Primary Diagnosis:  Physical deconditioning Principal Problem: Physical deconditioning  Patient Active Problem List   Diagnosis Date Noted  . Physical deconditioning 02/11/2012  . Acute-on-chronic renal failure 02/04/2012  . Elevated brain natriuretic peptide (BNP) level 01/24/2012  . Chronic combined systolic and diastolic CHF (congestive heart failure) 01/24/2012  . Hyperkalemia 12/31/2011  . Anemia associated with acute blood loss 12/24/2011  . Warfarin-induced coagulopathy 12/24/2011  . MDS (myelodysplastic syndrome) 12/16/2011  . Hypercalcemia 12/04/2011  . CKD (chronic kidney disease), stage IV 12/04/2011  . Cachexia 12/04/2011  . Epistaxis 11/26/2011  . Pulmonary fibrosis 10/12/2011  . Pulmonary nodule seen on imaging study 10/12/2011  . Pancytopenia 06/30/2011  . Chronic diastolic heart failure 03/04/2011  . Encounter for long-term (current) use of anticoagulants 02/25/2011  . NSTEMI (non-ST elevated myocardial infarction) 02/16/2011  . Atrial fibrillation 02/16/2011  . DYSPNEA ON EXERTION 06/11/2010  . CARDIOMYOPATHY 08/05/2009  . PREMATURE VENTRICULAR CONTRACTIONS 08/05/2009  . SYNCOPE 08/05/2009  . BRADYCARDIA 06/06/2009  . HYPERLIPIDEMIA 07/02/2008  . Essential hypertension, benign 07/02/2008  . CAD 07/02/2008  . CAROTID STENOSIS 07/02/2008  . RENAL INSUFFICIENCY 07/02/2008    Expected Discharge Date: Expected Discharge Date: 02/22/12  Team Members Present: Physician: Dr. Faith Rogue Social Worker Present: Amada Jupiter, LCSW Nurse  Present: Carlean Purl, RN PT Present: Reggy Eye, PT OT Present: Roney Mans, OT Other (Discipline and Name): Tora Duck, PPS Coordinator and Bayard Hugger, RN     Current Status/Progress Goal Weekly Team Focus  Medical   foley catheter in afer TURP, persistent hematom. on HD, left arm looking better  decrease pain, balance volume, increase stamina  volume management to help with BP.  foley management, following serial blood counts   Bowel/Bladder   Foley patent, hematuria continues; HD Tues-Thurs-Sat. continent stool, LBM 11/18  Hematuria resolved by DC;   Monitor   Swallow/Nutrition/ Hydration             ADL's   Min A for funcitonal ambulation , sit to stand supervision, ADLs supervision to steady A  min A  activity tolerance, standing balance   Mobility   min A - supervision  supervision  activity tolerance   Communication             Safety/Cognition/ Behavioral Observations            Pain   Occasional c/o's of left arm pain ( AVG  placement); 1 Vicodin effective  managed 2/10  monitor   Skin   lt arm swollen, ecchymotic, tender  resolving by DC  monitor, elevate LUE    Rehab Goals Patient on target to meet rehab goals: Yes *See Interdisciplinary Assessment and Plan and progress notes for long and short-term goals  Barriers to Discharge: age, co-morbidites, pain    Possible Resolutions to Barriers:  continued strength training, establishing routine, family ed    Discharge Planning/Teaching Needs:  home with wife who can provide 24/7 assistanced      Team Discussion:  Supervision goals overall.  Better participation  today.    Revisions to Treatment Plan:  None   Continued Need for Acute Rehabilitation Level of Care: The patient requires daily medical management by a physician with specialized training in physical medicine and rehabilitation for the following conditions: Daily direction of a multidisciplinary physical rehabilitation program to  ensure safe treatment while eliciting the highest outcome that is of practical value to the patient.: Yes Daily medical management of patient stability for increased activity during participation in an intensive rehabilitation regime.: Yes Daily analysis of laboratory values and/or radiology reports with any subsequent need for medication adjustment of medical intervention for : Post surgical problems;Other  Yina Riviere 02/16/2012, 9:52 AM

## 2012-02-16 NOTE — Progress Notes (Signed)
[facility] Progress Note  Subjective:  Resting in bed.  No complaints today.  A little tired from rehab.   10 pt ROS asked and answered.  All systems negative except as indicated above.    Objective Filed Vitals:   02/16/12 0503 02/16/12 0506 02/16/12 0924 02/16/12 0925  BP: 97/60 91/54 95/45  76/47  Pulse: 59 85    Temp:      TempSrc:      Resp:      Weight:      SpO2:       Physical Exam General: Alert, NAD Heart: RRR, no murmur, rub or gallop appreciated. Lungs: CTA bilaterally.  No wheezes or rhonchi. Abdomen: Soft, non-tender, non distended. Normal bowel sounds. Extremities: Support stockings.  Large area of ecchymosis over upper and lower left arm but no signs of infection.  Edema improving.  Access incision healing nicely. Dialysis Access:  Right I-J.  Left upper AVF maturing, + bruit  Dialysis Orders: TTS as inpatient.  MWF @ [facility] as outpatient. Optiflux 180. 2 K+/ 2.50 Ca++ Bath. Estimated EDW 71.0 kg. BFR: 350/ DFR: A1.5. No heparin until hematuria resolved.  Right I-J.  Assessment/Plan: 1. Acute on chronic renal failure - now on HD w ESRD (first HD on 11/9 with temporary catheter); received R IJ catheter with creation of AVF per Dr. Edilia Bo 11/12;  Outpatient HD will be on MWF @ [facility]. Will continue TTS schedule until discharge 2. Anemia - Hgb down to 8.3 on 11/17, myelodysplastic syndrome is likely contributor.  Continue Aranesp. 3. Secondary hyperparathyroidism - Ca (corrected) 9.4, Phos increasing at 4.6.  Not currently on a binder.  Will continue to monitor 4. HTN/volume - BP's trending in the 90's systolic.  No s/s of volume overload. On Lopressor 12.5 BID 5. Nutrition - Albumin stable at 2.3.  Currently on Breeze supplement.  Will continue to monitor. 6. Deconditioning - Began rehab on 02/11/12. 7. Thrombocytopenia/Hematuria: Gross hematuria in foley bag.  Platelets improving 91.  No heparin on HD. 8. Myelodysplastic syndrome 9. Hx CVA 10. CAD/ Hx  Afib 11. Hx BPH  - s/p TURP on 10/25 per Dr. Alexia Freestone.  Scot Jun. Warren, PA-C 02/16/2012,1:28 PM  LOS: 5 days   Patient seen and examined and agree with assessment and plan as above.  [name]  MD [facility] [phone] pgr    [phone] cell 02/16/2012, 3:18 PM   Additional Objective Labs: Basic Metabolic Panel:  Lab 02/13/12 4782 02/10/12 0731 02/09/12 1359  NA 136 139 136  K 4.2 3.8 3.7  CL 99 103 103  CO2 26 25 23   GLUCOSE 95 123* 133*  BUN 38* 23 30*  CREATININE 7.91* 6.27* 7.22*  CALCIUM 8.0* 7.8* 7.4*  ALB -- -- --  PHOS 4.6 2.9 3.3   Liver Function Tests:  Lab 02/13/12 0731 02/10/12 0731 02/09/12 1359  AST -- -- --  ALT -- -- --  ALKPHOS -- -- --  BILITOT -- -- --  PROT -- -- --  ALBUMIN 2.3* 2.4* 2.4*   No results found for this basename: LIPASE:3,AMYLASE:3 in the last 168 hours CBC:  Lab 02/13/12 0731 02/10/12 0731  WBC 11.8* 13.0*  NEUTROABS -- --  HGB 8.3* 8.7*  HCT 26.8* 28.1*  MCV 92.4 92.4  PLT 91* 82*   Blood Culture    Component Value Date/Time   SDES URINE, RANDOM 12/25/2011 0316   SPECREQUEST NONE 12/25/2011 0316   CULT  Value: STAPHYLOCOCCUS SPECIES (COAGULASE NEGATIVE) Note:  RIFAMPIN AND GENTAMICIN SHOULD NOT BE USED AS SINGLE DRUGS FOR TREATMENT OF STAPH INFECTIONS. 12/25/2011 0316   REPTSTATUS 12/30/2011 FINAL 12/25/2011 0316    Medications:      . ciprofloxacin-dexamethasone  4 drop Left Ear BID  . darbepoetin  100 mcg Intravenous Q Thu-HD  . feeding supplement  1 Container Oral BID BM  . metoprolol tartrate  12.5 mg Oral BID  . multivitamin  1 tablet Oral QHS

## 2012-02-16 NOTE — Progress Notes (Addendum)
Nutrition Note  80/90-2-2 RF diet with 1200 ml Fluid Restriction Resource Breeze bid.    Wife reports pt with nausea and vomiting and episode of dizziness.  Chef prepared pt a special lunch today and unable to eat at this time.    Wife brought in protein powder that patient likes and has used at home.  This product is acceptable to use (1 serving per day only, mixed in  1 cup Almond Milk) based on potassium and phosphorus content.    Continue to provide preferences.  Monitor for needs closely.  Oran Rein, RD, LDN Clinical Inpatient Dietitian Pager:  [phone] Weekend and after hours pager:  [phone]

## 2012-02-16 NOTE — Progress Notes (Signed)
Physical Therapy Session Note  Patient Details  Name: Tracy [name] MRN: [id number] Date of Birth: July 11, 1919  Today's Date: 02/16/2012 Time:1000-1055 55 minutes  Skilled Therapeutic Interventions/Progress Updates: Treatment focused on activity tolerance.  Pt limited by low blood pressure and states he did not sleep well last night.  Gait multiple attempts 67-65' with supervision with RW.  Pt requires prolonged rest breaks between sets.  Nu step for LE/UE strength and endurance 3 x 2 minutes with prolonged rest breaks in between.  Berg balance test performed.  Pt scored 31/56.  Pt educated on the results and agrees with PT recommendation to use RW at all times when at home.     Therapy Documentation Vital Signs: BP sitting: 104/54 Standing 88/51 Pain:  no c/o pain     Balance: Standardized Balance Assessment Standardized Balance Assessment: Berg Balance Test Berg Balance Test Sit to Stand: Able to stand  independently using hands Standing Unsupported: Able to stand 2 minutes with supervision Sitting with Back Unsupported but Feet Supported on Floor or Stool: Able to sit safely and securely 2 minutes Stand to Sit: Sits safely with minimal use of hands Transfers: Able to transfer safely, minor use of hands Standing Unsupported with Eyes Closed: Able to stand 10 seconds with supervision Standing Ubsupported with Feet Together: Needs help to attain position but able to stand for 30 seconds with feet together From Standing, Reach Forward with Outstretched Arm: Reaches forward but needs supervision From Standing Position, Pick up Object from Floor: Able to pick up shoe, needs supervision From Standing Position, Turn to Look Behind Over each Shoulder: Turn sideways only but maintains balance Turn 360 Degrees: Needs close supervision or verbal cueing Standing Unsupported, Alternately Place Feet on Step/Stool: Able to complete >2 steps/needs minimal assist Standing Unsupported, One Foot in  Front: Needs help to step but can hold 15 seconds Standing on One Leg: Unable to try or needs assist to prevent fall Total Score: 31   See FIM for current functional status  Therapy/Group: Individual Therapy  Cleda Imel 02/16/2012, 10:29 AM

## 2012-02-16 NOTE — Progress Notes (Signed)
Occupational Therapy Note  Patient Details  Name: Tracy [name] MRN: [id number] Date of Birth: 16-Dec-1919 Today's Date: 02/16/2012  Time: 1130-1200 Pt denied pain Individual therapy Pt in bed resting but agreed to sit EOB and participate in therex with theraband and 1.5# dumb bells.  Therex included PNF exercises with theraband and shoulder flexion exercises with dumb bells.  Wife present during therapy.  Instructed patient to complete exercises 2-3 times daily.  Pt and wife verbalized understanding.   Lavone Neri [facility] 02/16/2012, 3:04 PM

## 2012-02-16 NOTE — Progress Notes (Signed)
Occupational Therapy Session Note  Patient Details  Name: Tracy [name] MRN: [id number] Date of Birth: 03-10-1920  Today's Date: 02/16/2012 Time: 0830-0930 Time Calculation (min): 60 min  Short Term Goals: Week 1:  OT Short Term Goal 1 (Week 1): Pt. will do bathing at min a level at sink with sit to stand OT Short Term Goal 2 (Week 1): Pt. will dress lower body at min assist level OT Short Term Goal 3 (Week 1): Pt. will perfrom toileting at minimal assist level OT Short Term Goal 4 (Week 1): Pt. will do toilet transfer at min assist level  Skilled Therapeutic Interventions/Progress Updates:    1:1 self care retraining: including bathing, dressing, toileting and short distance funcitonal ambulation with RW. Pt with increased c/o fatigue today (consistant with low BPs - see vitals); pt with LOB with ability to recover with ambulation to bathroom. Pt able to complete toileting, bathing and dressing sit to stand with supervision with frequent rest breaks.  Therapy Documentation Precautions:  Precautions Precautions: Fall Precaution Comments: Monitor vitals Restrictions Weight Bearing Restrictions: No    Vital Signs: Therapy Vitals BP: 76/47 mmHg Patient Position, if appropriate: Standing Pain:  no c/o  See FIM for current functional status  Therapy/Group: Individual Therapy  Roney Mans [facility] 02/16/2012, 9:31 AM

## 2012-02-16 NOTE — Progress Notes (Signed)
Patient ID: Tracy [name], male   DOB: 1919/08/31, 78 y.o.   MRN: [id number]    Subjective/Complaints: In good spirits this am. Appropriate food appears to have been brought. Getting stronger  Review of Systems  Genitourinary: Positive for hematuria.   .  All other systems reviewed and are negative.    Objective: Vital Signs: Blood pressure 91/54, pulse 85, temperature 98.3 F (36.8 C), temperature source Oral, resp. rate 19, weight 68.6 kg (151 lb 3.8 oz), SpO2 95.00%. No results found. Results for orders placed during the hospital encounter of 02/11/12 (from the past 72 hour(s))  RENAL FUNCTION PANEL     Status: Abnormal   Collection Time   02/13/12  7:31 AM      Component Value Range Comment   Sodium 136  135 - 145 mEq/L    Potassium 4.2  3.5 - 5.1 mEq/L    Chloride 99  96 - 112 mEq/L    CO2 26  19 - 32 mEq/L    Glucose, Bld 95  70 - 99 mg/dL    BUN 38 (*) 6 - 23 mg/dL    Creatinine, Ser 7.82 (*) 0.50 - 1.35 mg/dL    Calcium 8.0 (*) 8.4 - 10.5 mg/dL    Phosphorus 4.6  2.3 - 4.6 mg/dL    Albumin 2.3 (*) 3.5 - 5.2 g/dL    GFR calc non Af Amer 5 (*) >90 mL/min    GFR calc Af Amer 6 (*) >90 mL/min   CBC     Status: Abnormal   Collection Time   02/13/12  7:31 AM      Component Value Range Comment   WBC 11.8 (*) 4.0 - 10.5 K/uL    RBC 2.90 (*) 4.22 - 5.81 MIL/uL    Hemoglobin 8.3 (*) 13.0 - 17.0 g/dL    HCT 95.6 (*) 21.3 - 52.0 %    MCV 92.4  78.0 - 100.0 fL    MCH 28.6  26.0 - 34.0 pg    MCHC 31.0  30.0 - 36.0 g/dL    RDW 08.6 (*) 57.8 - 15.5 %    Platelets 91 (*) 150 - 400 K/uL CONSISTENT WITH PREVIOUS RESULT     HEENT: normal Cardio: irregular Resp: CTA B/L GI: BS positive Extremity:  No Edema.   Skin:   Wound redness L antecubital fossa, with bruising but swelling and tenderness much improved. Neuro: Alert/Oriented. Moves all 4's. Cognitively intact. Musc/Skel:  Swelling L elbow op site Gen NAD Urine red but non-cloudy   Assessment/Plan: 1. Functional  deficits secondary to deconditioning acute on chronic renal failure, anemia due to hematuria which require 3+ hours per day of interdisciplinary therapy in a comprehensive inpatient rehab setting. Physiatrist is providing close team supervision and 24 hour management of active medical problems listed below. Physiatrist and rehab team continue to assess barriers to discharge/monitor patient progress toward functional and medical goals. FIM: FIM - Bathing Bathing Steps Patient Completed: Chest;Right Arm;Left Arm;Abdomen;Front perineal area;Buttocks;Right lower leg (including foot);Left upper leg;Right upper leg;Left lower leg (including foot) Bathing: 5: Set-up assist to: Adjust water temp  FIM - Upper Body Dressing/Undressing Upper body dressing/undressing steps patient completed: Thread/unthread right sleeve of pullover shirt/dresss;Thread/unthread left sleeve of pullover shirt/dress;Put head through opening of pull over shirt/dress;Pull shirt over trunk Upper body dressing/undressing: 5: Supervision: Safety issues/verbal cues FIM - Lower Body Dressing/Undressing Lower body dressing/undressing steps patient completed: Thread/unthread right underwear leg;Thread/unthread left underwear leg;Thread/unthread right pants leg;Pull underwear up/down;Thread/unthread left pants leg;Pull  pants up/down;Don/Doff right sock;Don/Doff left sock Lower body dressing/undressing: 5: Set-up assist to: Don/Doff TED stocking  FIM - Toileting Toileting steps completed by patient: Adjust clothing after toileting;Adjust clothing prior to toileting Toileting: 3: Mod-Patient completed 2 of 3 steps  FIM - Diplomatic Services operational officer Devices: Elevated toilet seat;Walker Toilet Transfers: 3-To toilet/BSC: Mod A (lift or lower assist);3-From toilet/BSC: Mod A (lift or lower assist)  FIM - Bed/Chair Transfer Bed/Chair Transfer Assistive Devices: Therapist, occupational: 5: Bed > Chair or W/C: Supervision  (verbal cues/safety issues);5: Chair or W/C > Bed: Supervision (verbal cues/safety issues)  FIM - Locomotion: Wheelchair Distance: 40 Locomotion: Wheelchair: 1: Travels less than 50 ft with minimal assistance (Pt.>75%) FIM - Locomotion: Ambulation Locomotion: Ambulation Assistive Devices: Parallel bars (Pt fatigued this afternoon) Ambulation/Gait Assistance: 3: Mod assist Locomotion: Ambulation: 2: Travels 50 - 149 ft with supervision/safety issues  Comprehension Comprehension Mode: Auditory Comprehension: 5-Follows basic conversation/direction: With no assist  Expression Expression Mode: Verbal Expression: 5-Expresses basic needs/ideas: With extra time/assistive device  Social Interaction Social Interaction: 5-Interacts appropriately 90% of the time - Needs monitoring or encouragement for participation or interaction.  Problem Solving Problem Solving: 5-Solves complex 90% of the time/cues < 10% of the time  Memory Memory: 4-Recognizes or recalls 75 - 89% of the time/requires cueing 10 - 24% of the time  Medical Problem List and Plan:  1. DVT Prophylaxis/Anticoagulation: Mechanical: Sequential compression devices, below knee Bilateral lower extremities  2. Pain Management: prn hydrocodone effective.  3. Mood:  Offer ego support.  4. Neuropsych: This patient is capable of making decisions on his/her own behalf.  5. Acute on chronic renal failure: Now HD dependent. Continue HD T, T, S. Daily weights. Renal diet.   - RD assistance with food choices appreciated 6. Atrial fibrillation: Monitor HR with bid checks. Continue metoprolol with hold parameters for orthostasis  -continue TEDS, precautions for orthostasis  -Off coumadin due to recent bleeding events.  7. Mixed CHF with pleural effusion: Appears to be compensated on dialysis and FR. Continue daily weights.  8. Acute on chronic anemia: On aranesp weekly. Continue to monitor H/H with HD. Has been relatively stable despite  ongoing urethral bleeding.  9. MDS: likely contributing to thrombocytopenia. Platelets on slow upward trend 58---> 82.  10. BPH s/p TURP: Foley to continue due to continue hematuria.  Hematuria present. Blood counts are increasing  -will need to go home with cath LOS (Days) 5 A FACE TO FACE EVALUATION WAS PERFORMED  SWARTZ,ZACHARY T 02/16/2012, 7:15 AM

## 2012-02-16 NOTE — Progress Notes (Signed)
Physical Therapy Note  Patient Details  Name: Tracy [name] MRN: [id number] Date of Birth: 04/19/1919 Today's Date: 02/16/2012  Time: 1400-1440 40 minutes  No c/o pain.  Pt had episode of nausea earlier but was able to participate in session.  Pt performed gait training for activity tolerance and balance in controlled and household environment including turns and obstacle negotiation with close supervision.  Pt able to increase gait distance at end of session to > 100' without rest.  Pt continued to require frequent rest breaks but with no c/o dizziness or nausea throughout session.  BP seated 103/59, standing 96/56.  Individual therapy   Snow Peoples 02/16/2012, 2:48 PM

## 2012-02-17 ENCOUNTER — Encounter (HOSPITAL_COMMUNITY): Payer: Medicare Other

## 2012-02-17 ENCOUNTER — Inpatient Hospital Stay (HOSPITAL_COMMUNITY): Payer: Medicare Other | Admitting: Physical Therapy

## 2012-02-17 ENCOUNTER — Encounter (HOSPITAL_COMMUNITY): Payer: Medicare Other | Admitting: Occupational Therapy

## 2012-02-17 LAB — CBC
HCT: 25.1 % — ABNORMAL LOW (ref 39.0–52.0)
Platelets: 100 10*3/uL — ABNORMAL LOW (ref 150–400)
RDW: 16.8 % — ABNORMAL HIGH (ref 11.5–15.5)
WBC: 7.2 10*3/uL (ref 4.0–10.5)

## 2012-02-17 LAB — RENAL FUNCTION PANEL
Albumin: 2.6 g/dL — ABNORMAL LOW (ref 3.5–5.2)
BUN: 19 mg/dL (ref 6–23)
Chloride: 99 mEq/L (ref 96–112)
GFR calc Af Amer: 8 mL/min — ABNORMAL LOW (ref >90)
GFR calc non Af Amer: 7 mL/min — ABNORMAL LOW (ref >90)
Potassium: 3.6 mEq/L (ref 3.5–5.1)
Sodium: 139 mEq/L (ref 135–145)

## 2012-02-17 MED ORDER — SCOPOLAMINE 1 MG/3DAYS TD PT72
1.0000 | MEDICATED_PATCH | TRANSDERMAL | Status: DC
  Administered 2012-02-17 – 2012-02-23 (×3): 1.5 mg via TRANSDERMAL
  Filled 2012-02-17 (×3): qty 1

## 2012-02-17 MED ORDER — DARBEPOETIN ALFA-POLYSORBATE 100 MCG/0.5ML IJ SOLN
INTRAMUSCULAR | Status: AC
  Filled 2012-02-17: qty 0.5

## 2012-02-17 NOTE — Progress Notes (Addendum)
[facility] Progress Note  Subjective:   Resting in bed.  Did not participate in therapy today secondary to dizziness.  Seems slightly altered compared to yesterday. States the room is going "around and around" and he just "wants to leave this place."  No other complaints.  Unable to complete full ROS due to AMS   Objective Filed Vitals:   02/17/12 0602 02/17/12 0605 02/17/12 0606 02/17/12 1015  BP: 112/65 93/54 96/52  109/55  Pulse: 85 88 82   Temp: 98.1 F (36.7 C)     TempSrc: Oral     Resp: 18     Weight: 63.6 kg (140 lb 3.4 oz)     SpO2: 92%      Physical Exam General: Laying in bed, slow to arouse, slow to respond to questions Heart: RRR, no murmur, rub or gallop appreciated.  Lungs: CTA bilaterally. No wheezes or rhonchi.  Abdomen: Soft, non-tender, non distended. Normal bowel sounds.  Extremities: No edema. Large area of ecchymosis over upper and lower left arm improving.  Edema resolving, no signs of infection.  Incision healing nicely.  Dialysis Access: Right I-J. Left upper AVF maturing, + bruit   Dialysis Orders:  TTS as inpatient. MWF @ [facility] as outpatient. Optiflux 180. 2 K+/ 2.50 Ca++ Bath. Estimated EDW to be determined at d/c. BFR: 350/ DFR: A1.5. No heparin until hematuria resolved. Right I-J.   Assessment/Plan: 1. Acute on chronic renal failure - now on HD w ESRD (first HD on 11/9 with temporary catheter); received R IJ catheter with creation of AVF per Dr. Edilia Bo 11/12; Outpatient HD will be on MWF @ [facility]. Will continue TTS schedule until discharge.  2. Anemia - Last labs on 02/13/12 - Hgb down to 8.3, myelodysplastic syndrome is likely contributor. Continue Aranesp.  3. Secondary hyperparathyroidism - Last labs on 02/13/12 - Ca (corrected) 9.4, Phos increasing at 4.6 but still at goal. Not currently on a binder. Will continue to monitor.   4. HTN/volume - BP's trending in the 90's to 110's systolic. No s/s of volume overload. On Lopressor 12.5 BID  Looks dry, check orthostatics and no fluid off with HD today. Weight down 91 > 69 kg since starting dialysis.  5. Nutrition - Albumin stable at 2.3. Currently on Breeze supplement. Will continue to monitor.  6. Deconditioning - Began rehab on 02/11/12.  7. Thrombocytopenia/Hematuria: Gross hematuria remains. Foley in place. Platelets 91 as of 02/13/12. No heparin on HD.  8. Myelodysplastic syndrome  9. Hx CVA  10. CAD/ Hx Afib  11. Hx BPH - s/p TURP on 10/25 per Dr. Alexia Freestone. 12. Dizziness/AMS - Onset of dizziness and nausea atributed to BPPV secondary to chronic ear surgery/ inner ear fluid collection.  Being managed by Dr. Riley Kill. Could be vol depletion as well-- check orthostatics  Scot Jun. Broadus John, PA-C 02/17/2012,12:06 PM  LOS: 6 days   Patient seen and examined and agree with assessment and plan as above with additions as indicated.  [name]  MD [facility] [phone] pgr    [phone] cell 02/17/2012, 2:53 PM      Additional Objective Labs: Basic Metabolic Panel:  Lab 02/13/12 2841  NA 136  K 4.2  CL 99  CO2 26  GLUCOSE 95  BUN 38*  CREATININE 7.91*  CALCIUM 8.0*  ALB --  PHOS 4.6   Liver Function Tests:  Lab 02/13/12 0731  AST --  ALT --  ALKPHOS --  BILITOT --  PROT --  ALBUMIN  2.3*   CBC:  Lab 02/13/12 0731  WBC 11.8*  NEUTROABS --  HGB 8.3*  HCT 26.8*  MCV 92.4  PLT 91*   Blood Culture    Component Value Date/Time   SDES URINE, RANDOM 12/25/2011 0316   SPECREQUEST NONE 12/25/2011 0316   CULT  Value: STAPHYLOCOCCUS SPECIES (COAGULASE NEGATIVE) Note: RIFAMPIN AND GENTAMICIN SHOULD NOT BE USED AS SINGLE DRUGS FOR TREATMENT OF STAPH INFECTIONS. 12/25/2011 0316   REPTSTATUS 12/30/2011 FINAL 12/25/2011 0316     Medications:      . ciprofloxacin-dexamethasone  4 drop Left Ear BID  . darbepoetin  100 mcg Intravenous Q Thu-HD  . feeding supplement  1 Container Oral BID BM  . metoprolol tartrate  12.5 mg Oral BID  .  multivitamin  1 tablet Oral QHS  . scopolamine  1 patch Transdermal Q72H

## 2012-02-17 NOTE — Progress Notes (Signed)
Patient vomited x2 and reported ensure caused "upset stomach" . Patient refused resource breeze. Patient had 2 loose bm's . Refused to eat lunch . zofran 4mg  po given see mar . P. Love PA aware . Continue with plan of care . Cleotilde Neer

## 2012-02-17 NOTE — Progress Notes (Signed)
Pt laying in the bed working with therapy. No complaints or needs at this time.  Tracy [name]

## 2012-02-17 NOTE — Progress Notes (Signed)
Physical Therapy Note  Patient Details  Name: ALYXANDER KOLLMANN MRN: [id number] Date of Birth: 12-16-1919 Today's Date: 02/17/2012  Time: 944  Pt refused 60 minutes skilled PT due to increased dizziness and nausea, unable/unwilling to participate in any in bed or out of bed activity.   Faduma Cho 02/17/2012, 10:32 AM

## 2012-02-17 NOTE — Progress Notes (Signed)
Occupational Therapy Session Note  Patient Details  Name: Tracy [name] MRN: [id number] Date of Birth: [birth year]  Today's Date: 02/17/2012 Time: 4098-1191 Time Calculation (min): 15 min  Short Term Goals: Week 1:  OT Short Term Goal 1 (Week 1): Pt. will do bathing at min a level at sink with sit to stand OT Short Term Goal 2 (Week 1): Pt. will dress lower body at min assist level OT Short Term Goal 3 (Week 1): Pt. will perfrom toileting at minimal assist level OT Short Term Goal 4 (Week 1): Pt. will do toilet transfer at min assist level  Skilled Therapeutic Interventions/Progress Updates:    1:1 pt unable to tolerate OOB activity this am. With MD present in room: (MD and pt report) increased dizziness and nystagmus with rolling to the left in bed and coming to to EOB on left side- returned to supine for a few min. Pt then came up to right side of bed with cue to take to maintain eye position on something stable on the wall- when pt sat up pt with increased dizziness and nausea. Pt unable to maintaining sitting upright- swaying side to side requiring min A. Pt requesting to just lay down.   Therapy Documentation Precautions:  Precautions Precautions: Fall Precaution Comments: Monitor vitals Restrictions Weight Bearing Restrictions: No General: General Amount of Missed OT Time (min): 45 Minutes Pain:  no c/o pain  See FIM for current functional status  Therapy/Group: Individual Therapy  Roney Mans [facility] 02/17/2012, 9:20 AM

## 2012-02-17 NOTE — Progress Notes (Signed)
Patient Details  Name: Tracy [name]  MRN: [id number]  Date of Birth: Jan 22, 1920  Today's Date: 02/17/2012  Time: 1515-1550 Total Time: 40 min Short Term Goals:  Week 1: PT Short Term Goal 1 (Week 1): STG=LTG due to LOS, S overall   Skilled Therapeutic Interventions/Progress Updates: PT called to screen pt secondary to dizziness that has been limiting therapies. Unable to to perform full vestibular evaluation secondary to time constraints due to pt leaving for hemodialysis. With supine to sit pt reported room spinning dizziness and Lt. torsional downbeating nystagmus noted.    Performed positional testing. Pt noted to have negative hallpike dix to Lt. Side, positive hallpike dix to Rt. With Rt. torsional and apparently downbeating nystagmus. Dizziness lasted ~1 min however nystagmus lasted >1 min. Unclear whether dizziness is canalithiasis or cupulolithiasis (symptoms altered by anti nausea/dizziness medication). Treated pt x 2 reps with canalith repositioning maneuver for Rt. ear and pt reported significant improvement, no nystagmus or dizziness noted with supine to sit as before. Pt will need to be reassessed for dizziness tomorrow, if not improved pt will benefit from treatment for cupulolithiasis.   Therapy Documentation  Precautions:  Precautions  Precautions: Fall  Precaution Comments: Monitor vitals  Restrictions  Weight Bearing Restrictions: No   Malaiya Paczkowski (Cheek) Carleene Mains PT, DPT

## 2012-02-17 NOTE — Progress Notes (Signed)
Physical Therapy Session Note  Patient Details  Name: Tracy [name] MRN: [id number] Date of Birth: 1919/08/20  Today's Date: 02/17/2012 Time: 1115   Short Term Goals: Week 1:  PT Short Term Goal 1 (Week 1): STG=LTG due to LOS, S overall  Skilled Therapeutic Interventions/Progress Updates:  Pt refused 45 minutes skilled PT due to reported dizziness.  Patient stated that his nausea was diminished due to patch but that he had "just started feeling a little better" and finally had something on his stomach and did not want to move.  Patient was unwilling to participate in any in bed or out of bed activity at this time.       Therapy Documentation Precautions:  Precautions Precautions: Fall Precaution Comments: Monitor vitals Restrictions Weight Bearing Restrictions: No General: Amount of Missed PT Time (min): 45 Minutes Missed Time Reason: Patient ill (comment)   Therapy/Group: Individual Therapy  Rexene Agent 02/17/2012, 12:45 PM

## 2012-02-17 NOTE — Progress Notes (Signed)
Blood pressure 109/55 pulse 83 at 0830. Patient unable to sit edge of bed due to increase dizziness . Patient slowly raised edge of bed . Blood pressure 100/ 52 pulse  62 sitting edge of bed . Continue with plan of care .        Cleotilde Neer

## 2012-02-17 NOTE — Progress Notes (Signed)
Patient ID: Tracy [name], male   DOB: [birth year], 78 y.o.   MRN: [id number]    Subjective/Complaints: Dizziness when getting out of bed today. Room is spinning to the right  Review of Systems  Genitourinary: Positive for hematuria.   .  All other systems reviewed and are negative.    Objective: Vital Signs: Blood pressure 96/52, pulse 82, temperature 98.1 F (36.7 C), temperature source Oral, resp. rate 18, weight 63.6 kg (140 lb 3.4 oz), SpO2 92.00%. No results found. No results found for this or any previous visit (from the past 72 hour(s)).   HEENT: normal Cardio: irregular Resp: CTA B/L GI: BS positive Extremity:  No Edema.   Skin:   Wound redness L antecubital fossa, with bruising but swelling and tenderness much improved. Neuro: Alert/Oriented. Moves all 4's. Cognitively intact. nygstagmus 6 beats with right lateral gaze, no nystagmus with left gaze Musc/Skel:  Swelling L elbow op site Gen NAD Urine red but non-cloudy   Assessment/Plan: 1. Functional deficits secondary to deconditioning acute on chronic renal failure, anemia due to hematuria which require 3+ hours per day of interdisciplinary therapy in a comprehensive inpatient rehab setting. Physiatrist is providing close team supervision and 24 hour management of active medical problems listed below. Physiatrist and rehab team continue to assess barriers to discharge/monitor patient progress toward functional and medical goals. FIM: FIM - Bathing Bathing Steps Patient Completed: Chest;Right Arm;Left Arm;Abdomen;Front perineal area;Buttocks;Right lower leg (including foot);Left upper leg;Right upper leg;Left lower leg (including foot) Bathing: 5: Set-up assist to: Adjust water temp  FIM - Upper Body Dressing/Undressing Upper body dressing/undressing steps patient completed: Thread/unthread right sleeve of pullover shirt/dresss;Thread/unthread left sleeve of pullover shirt/dress;Put head through opening of pull over  shirt/dress;Pull shirt over trunk Upper body dressing/undressing: 5: Supervision: Safety issues/verbal cues FIM - Lower Body Dressing/Undressing Lower body dressing/undressing steps patient completed: Thread/unthread right underwear leg;Thread/unthread left underwear leg;Thread/unthread right pants leg;Pull underwear up/down;Thread/unthread left pants leg;Pull pants up/down;Don/Doff right sock;Don/Doff left sock Lower body dressing/undressing: 5: Set-up assist to: Don/Doff TED stocking  FIM - Toileting Toileting steps completed by patient: Adjust clothing after toileting;Adjust clothing prior to toileting Toileting: 3: Mod-Patient completed 2 of 3 steps  FIM - Diplomatic Services operational officer Devices: Elevated toilet seat;Walker Toilet Transfers: 3-To toilet/BSC: Mod A (lift or lower assist);3-From toilet/BSC: Mod A (lift or lower assist)  FIM - Bed/Chair Transfer Bed/Chair Transfer Assistive Devices: Therapist, occupational: 5: Chair or W/C > Bed: Supervision (verbal cues/safety issues);5: Bed > Chair or W/C: Supervision (verbal cues/safety issues)  FIM - Locomotion: Wheelchair Distance: 40 Locomotion: Wheelchair: 1: Travels less than 50 ft with minimal assistance (Pt.>75%) FIM - Locomotion: Ambulation Locomotion: Ambulation Assistive Devices: Parallel bars (Pt fatigued this afternoon) Ambulation/Gait Assistance: 3: Mod assist Locomotion: Ambulation: 2: Travels 50 - 149 ft with supervision/safety issues  Comprehension Comprehension Mode: Auditory Comprehension: 5-Follows basic conversation/direction: With no assist  Expression Expression Mode: Verbal Expression: 5-Expresses basic needs/ideas: With extra time/assistive device  Social Interaction Social Interaction: 5-Interacts appropriately 90% of the time - Needs monitoring or encouragement for participation or interaction.  Problem Solving Problem Solving: 5-Solves complex 90% of the time/cues < 10% of the  time  Memory Memory: 4-Recognizes or recalls 75 - 89% of the time/requires cueing 10 - 24% of the time  Medical Problem List and Plan:  1. DVT Prophylaxis/Anticoagulation: Mechanical: Sequential compression devices, below knee Bilateral lower extremities  2. Pain Management: prn hydrocodone effective.  3. Mood:  Offer ego support.  4.  Neuropsych: This patient is capable of making decisions on his/her own behalf.  5. Acute on chronic renal failure: Now HD dependent. Continue HD T, T, S. Daily weights. Renal diet.   - RD assistance with food choices appreciated 6. Atrial fibrillation: Monitor HR with bid checks. Continue metoprolol with hold parameters for orthostasis  -continue TEDS, precautions for orthostasis  -Off coumadin due to recent bleeding events.  7. Mixed CHF with pleural effusion: Appears to be compensated on dialysis and FR. Continue daily weights.  8. Acute on chronic anemia: On aranesp weekly. Continue to monitor H/H with HD. Has been relatively stable despite ongoing urethral bleeding.  9. MDS: likely contributing to thrombocytopenia. Platelets on slow upward trend 58---> 82.  10. BPH s/p TURP: Foley to continue due to continue hematuria.  Hematuria present. Blood counts are increasing  -will need to go home with cath 11. Dizziness and nausea- appears to be BPPV related to chronic ear surgery,  ?fluid collection in ear  -will try a scopolamine patch  -request vestibular eval from therapy LOS (Days) 6 A FACE TO FACE EVALUATION WAS PERFORMED  Jacobus Colvin T 02/17/2012, 8:40 AM

## 2012-02-17 NOTE — Progress Notes (Signed)
Time: 1300-1315 15 minutes  Pt c/o continuing dizziness, diarrhea and nausea.  Pt not agreeable to participate in OOB exercise or activity despite attempts by PT and pt's wife to encourage participation.  Pt/wife educated on importance of activity and getting OOB to promote strengthening and healing, wife expresses understanding, pt still not willing to participate.  Pt's wife educated on pt's current mobility status.  Individual therapy

## 2012-02-18 ENCOUNTER — Encounter (HOSPITAL_COMMUNITY): Payer: Medicare Other | Admitting: Occupational Therapy

## 2012-02-18 ENCOUNTER — Inpatient Hospital Stay (HOSPITAL_COMMUNITY): Payer: Medicare Other | Admitting: Physical Therapy

## 2012-02-18 ENCOUNTER — Inpatient Hospital Stay (HOSPITAL_COMMUNITY): Payer: Medicare Other | Admitting: Occupational Therapy

## 2012-02-18 DIAGNOSIS — R339 Retention of urine, unspecified: Secondary | ICD-10-CM

## 2012-02-18 DIAGNOSIS — N19 Unspecified kidney failure: Secondary | ICD-10-CM

## 2012-02-18 DIAGNOSIS — R5381 Other malaise: Secondary | ICD-10-CM

## 2012-02-18 MED ORDER — SODIUM CHLORIDE 0.9 % IV SOLN: 25.0000 mg | Freq: Once | INTRAVENOUS | Status: DC

## 2012-02-18 MED ORDER — SODIUM CHLORIDE 0.9 % IV SOLN
25.0000 mg | Freq: Once | INTRAVENOUS | Status: AC
  Administered 2012-02-20: 25 mg via INTRAVENOUS
  Filled 2012-02-18 (×2): qty 2

## 2012-02-18 NOTE — Progress Notes (Signed)
[facility] Progress Note  Subjective:   Sitting up in the recliner.  No emerging complaints.  Dizziness improved.  Denies HA, N/V/D, or SOB. GU positive for hematuria.    10 pt ROS asked and answered.  All systems negative except as described above.  Objective Filed Vitals:   02/17/12 2015 02/17/12 2204 02/17/12 2207 02/18/12 0518  BP: 119/68 100/60 123/62 106/59  Pulse: 97 72 100 94  Temp: 98 F (36.7 C)  98 F (36.7 C) 98 F (36.7 C)  TempSrc: Oral  Oral Oral  Resp: 18  20 20   Weight: 66.6 kg (146 lb 13.2 oz)     SpO2: 99%  100% 98%   Physical Exam General: Alert, cooperative, NAD Heart: RRR , no murmur, rub or gallop appreciated Lungs: Clear bilaterally.  No wheezes or rhonchi Abdomen: Soft, non-tender, non-distended. (+) bowel sounds Extremities: Support stockings. Large area of ecchymosis over upper and lower left arm. Dialysis Access: Right I-J.  Left upper AVF maturing, bandaged, bruit audible through dressing  Dialysis Orders:  New start, will be MWF @ [facility] as outpatient. Optiflux 180. 2 K+/ 2.50 Ca++ Bath. Estimated EDW to be determined at d/c. BFR: 350/ DFR: A1.5. No heparin until hematuria resolved. Right I-J.    Assessment/Plan:  1. Acute on chronic renal failure - now on HD w ESRD (first HD on 11/9 with temporary catheter); received R IJ catheter with creation of AVF per Dr. Edilia Bo 11/12; Outpatient HD will be on MWF @ [facility]. Will continue TTS schedule until discharge.  2. Anemia -  Hgb down 7.8, myelodysplastic syndrome is likely contributor. Tsat 37%, Ferritin 337. Continue Aranesp weekly.  Ferrlecit test dose tomorrow then 125 mg IV x 8 doses. 3. Secondary hyperparathyroidism - Ca (corrected) 9.3, Phos 4.0, at goal. Not currently on a binder. Will continue to monitor.  4. HTN/volume - BP's trending in the Low 100's systolic. Possibly volume depleted. On Lopressor 12.5 BID. Still endorsing some dizziness thought it is much improved since yesterday.   Weight down 91 > 69 kg since starting dialysis and still losing weight despite no fluid off with HD. Not eating much. Dizziness is due to postural hypotension from vol depletion. No fluid off with HD tomorrow, place IV and give NS liter bolus, push po fluids.  Standing BP today is 80/50.  5. Nutrition -  Albumin improving 2.6. Currently on Breeze supplement. Will continue to monitor.  6. Deconditioning - Began rehab on 02/11/12.  7. Thrombocytopenia/Hematuria: Gross hematuria remains. Foley in place. Platelets improving at 100. Continue to hold heparin on HD.  8. Myelodysplastic syndrome  9. Hx CVA  10. CAD/ Hx Afib  11. Hx BPH - s/p TURP on 10/25 per Dr. Alexia Freestone. 12. Dizziness/ AMS - Alert and oriented x 3.  Dizziness improving per patient and primary.     Scot Jun. Warren, PA-C 02/18/2012,10:18 AM  LOS: 7 days   Patient seen and examined and agree with assessment and plan as above with additions as indicated.  [name]  MD [facility] [phone] pgr    [phone] cell 02/18/2012, 11:51 AM     Additional Objective Labs: Basic Metabolic Panel:  Lab 02/17/12 4782 02/13/12 0731  NA 139 136  K 3.6 4.2  CL 99 99  CO2 27 26  GLUCOSE 104* 95  BUN 19 38*  CREATININE 6.32* 7.91*  CALCIUM 8.2* 8.0*  ALB -- --  PHOS 4.0 4.6   Liver Function Tests:  Lab 02/17/12  1630 02/13/12 0731  AST -- --  ALT -- --  ALKPHOS -- --  BILITOT -- --  PROT -- --  ALBUMIN 2.6* 2.3*   CBC:  Lab 02/17/12 1630 02/13/12 0731  WBC 7.2 11.8*  NEUTROABS -- --  HGB 7.8* 8.3*  HCT 25.1* 26.8*  MCV 90.3 92.4  PLT 100* 91*    Medications:      . ciprofloxacin-dexamethasone  4 drop Left Ear BID  . [EXPIRED] darbepoetin      . darbepoetin  100 mcg Intravenous Q Thu-HD  . metoprolol tartrate  12.5 mg Oral BID  . multivitamin  1 tablet Oral QHS  . scopolamine  1 patch Transdermal Q72H  . [DISCONTINUED] feeding supplement  1 Container Oral BID BM

## 2012-02-18 NOTE — Progress Notes (Signed)
Occupational Therapy Weekly Progress and Daily Note  Patient Details  Name: Tracy [name] MRN: [id number] Date of Birth: December 18, 1919  Today's Date: 02/18/2012 Time: 4098-1191 Time Calculation (min): 60 min  Patient has met 4 of 4 short term goals.  Pt has made improvements in active participation in therapy; however his independent is affected by vestibular deficits and orthostatic BP making him dizziness. Pt can perform sit to stand ADLs with supervision with frequent rest breaks due to low activity tolerance. Pt still requires min a for functional ambulation due to many LOB related to dizziness and lightheadedness and fatigue. Pt is on target for d/c next week.  Patient continues to demonstrate the following deficits: muscle weakness and activity tolerance, peripheral and decreased standing balance and decreased balance strategies and therefore will continue to benefit from skilled OT intervention to enhance overall performance with BADL and Reduce care partner burden.  Patient progressing toward long term goals..  Continue plan of care.  OT Short Term Goals Week 1:  OT Short Term Goal 1 (Week 1): Pt. will do bathing at min a level at sink with sit to stand OT Short Term Goal 1 - Progress (Week 1): Met OT Short Term Goal 2 (Week 1): Pt. will dress lower body at min assist level OT Short Term Goal 2 - Progress (Week 1): Met OT Short Term Goal 3 (Week 1): Pt. will perfrom toileting at minimal assist level OT Short Term Goal 3 - Progress (Week 1): Met OT Short Term Goal 4 (Week 1): Pt. will do toilet transfer at min assist level OT Short Term Goal 4 - Progress (Week 1): Met Week 2:  OT Short Term Goal 1 (Week 2): LTG=STG OT Short Term Goal 2 (Week 2): Complete family education with pt's wife  Skilled Therapeutic Interventions/Progress Updates:    continue with POC  1:1 self care retraining at sink level. Pt able to get to EOB with supervision but needed to sit for a minute before  transferring to chair at the sink. Pt able to perform sit to stands today with supervision but with ambulation (short distances) pt had 3 LOB in session requiring A to correct LOB. Pt with c/o right knee discomfort today. Pt's BP did drop 20 points (systolic) 107 to 80 ) sit to stand RN aware. Dicussed and review goals with pt and educated on pt's wife may still need to be "on him" to maintain safe balance. Pt with less complaints of dizziness today compared to yesterday (reported today was close to his baseline dizziness).    Therapy Documentation Precautions:  Precautions Precautions: Fall Precaution Comments: Monitor vitals Restrictions Weight Bearing Restrictions: No    Pain:  c/o right knee pain- RN aware.   See FIM for current functional status  Therapy/Group: Individual Therapy  Roney [facility] 02/18/2012, 11:13 AM

## 2012-02-18 NOTE — Progress Notes (Signed)
Patient ID: Tracy [name], male   DOB: 1919-12-11, 79 y.o.   MRN: [id number]    Subjective/Complaints: Dizziness much better today. Right knee is sore. Complains about dialysis timing and not getting dinner  Review of Systems  Genitourinary: Positive for hematuria.   .  All other systems reviewed and are negative.    Objective: Vital Signs: Blood pressure 106/59, pulse 94, temperature 98 F (36.7 C), temperature source Oral, resp. rate 20, weight 66.6 kg (146 lb 13.2 oz), SpO2 98.00%. No results found. Results for orders placed during the hospital encounter of 02/11/12 (from the past 72 hour(s))  CBC     Status: Abnormal   Collection Time   02/17/12  4:30 PM      Component Value Range Comment   WBC 7.2  4.0 - 10.5 K/uL    RBC 2.78 (*) 4.22 - 5.81 MIL/uL    Hemoglobin 7.8 (*) 13.0 - 17.0 g/dL    HCT 02.7 (*) 25.3 - 52.0 %    MCV 90.3  78.0 - 100.0 fL    MCH 28.1  26.0 - 34.0 pg    MCHC 31.1  30.0 - 36.0 g/dL    RDW 66.4 (*) 40.3 - 15.5 %    Platelets 100 (*) 150 - 400 K/uL CONSISTENT WITH PREVIOUS RESULT  RENAL FUNCTION PANEL     Status: Abnormal   Collection Time   02/17/12  4:30 PM      Component Value Range Comment   Sodium 139  135 - 145 mEq/L    Potassium 3.6  3.5 - 5.1 mEq/L    Chloride 99  96 - 112 mEq/L    CO2 27  19 - 32 mEq/L    Glucose, Bld 104 (*) 70 - 99 mg/dL    BUN 19  6 - 23 mg/dL    Creatinine, Ser 4.74 (*) 0.50 - 1.35 mg/dL    Calcium 8.2 (*) 8.4 - 10.5 mg/dL    Phosphorus 4.0  2.3 - 4.6 mg/dL    Albumin 2.6 (*) 3.5 - 5.2 g/dL    GFR calc non Af Amer 7 (*) >90 mL/min    GFR calc Af Amer 8 (*) >90 mL/min      HEENT: normal Cardio: irregular Resp: CTA B/L GI: BS positive Extremity:  No Edema.   Skin:   Wound redness L antecubital fossa, with bruising but swelling and tenderness much improved. Neuro: Alert/Oriented. Moves all 4's. Cognitively intact. [id number] beatwith right lateral gaze, no nystagmus with left gaze.  Musc/Skel: right knee tender  over patella. Minimal swelling Gen NAD Urine red but non-cloudy   Assessment/Plan: 1. Functional deficits secondary to deconditioning acute on chronic renal failure, anemia due to hematuria which require 3+ hours per day of interdisciplinary therapy in a comprehensive inpatient rehab setting. Physiatrist is providing close team supervision and 24 hour management of active medical problems listed below. Physiatrist and rehab team continue to assess barriers to discharge/monitor patient progress toward functional and medical goals. FIM: FIM - Bathing Bathing Steps Patient Completed: Chest;Right Arm;Left Arm;Abdomen;Front perineal area;Buttocks;Right lower leg (including foot);Left upper leg;Right upper leg;Left lower leg (including foot) Bathing: 5: Set-up assist to: Adjust water temp  FIM - Upper Body Dressing/Undressing Upper body dressing/undressing steps patient completed: Thread/unthread right sleeve of pullover shirt/dresss;Thread/unthread left sleeve of pullover shirt/dress;Put head through opening of pull over shirt/dress;Pull shirt over trunk Upper body dressing/undressing: 5: Supervision: Safety issues/verbal cues FIM - Lower Body Dressing/Undressing Lower body dressing/undressing steps patient completed: Thread/unthread  right underwear leg;Thread/unthread left underwear leg;Thread/unthread right pants leg;Pull underwear up/down;Thread/unthread left pants leg;Pull pants up/down;Don/Doff right sock;Don/Doff left sock Lower body dressing/undressing: 5: Set-up assist to: Don/Doff TED stocking  FIM - Toileting Toileting steps completed by patient: Adjust clothing after toileting;Adjust clothing prior to toileting Toileting: 3: Mod-Patient completed 2 of 3 steps  FIM - Diplomatic Services operational officer Devices: Elevated toilet seat;Walker Toilet Transfers: 3-To toilet/BSC: Mod A (lift or lower assist);3-From toilet/BSC: Mod A (lift or lower assist)  FIM - Bed/Chair  Transfer Bed/Chair Transfer Assistive Devices: Therapist, occupational: 0: Activity did not occur  FIM - Locomotion: Wheelchair Distance: 40 Locomotion: Wheelchair: 1: Travels less than 50 ft with minimal assistance (Pt.>75%) FIM - Locomotion: Ambulation Locomotion: Ambulation Assistive Devices: Parallel bars (Pt fatigued this afternoon) Ambulation/Gait Assistance: 3: Mod assist Locomotion: Ambulation: 2: Travels 50 - 149 ft with supervision/safety issues  Comprehension Comprehension Mode: Auditory Comprehension: 5-Follows basic conversation/direction: With extra time/assistive device  Expression Expression Mode: Verbal Expression: 5-Set-up assist with assistive device  Social Interaction Social Interaction: 5-Interacts appropriately 90% of the time - Needs monitoring or encouragement for participation or interaction.  Problem Solving Problem Solving: 5-Solves complex 90% of the time/cues < 10% of the time  Memory Memory: 4-Recognizes or recalls 75 - 89% of the time/requires cueing 10 - 24% of the time  Medical Problem List and Plan:  1. DVT Prophylaxis/Anticoagulation: Mechanical: Sequential compression devices, below knee Bilateral lower extremities  2. Pain Management: prn hydrocodone effective  -add ice for left knee 3. Mood:  Offer ego support.  4. Neuropsych: This patient is capable of making decisions on his/her own behalf.  5. Acute on chronic renal failure: Now HD dependent. Continue HD T, T, S. Daily weights. Renal diet.   - RD assistance with food choices appreciated 6. Atrial fibrillation: Monitor HR with bid checks. Continue metoprolol with hold parameters for orthostasis  -continue TEDS, precautions for orthostasis  -Off coumadin due to recent bleeding events.  7. Mixed CHF with pleural effusion: Appears to be compensated on dialysis and FR. Continue daily weights.  8. Acute on chronic anemia: On aranesp weekly. Continue to monitor H/H with HD. Has been  relatively stable despite ongoing urethral bleeding.  9. MDS: likely contributing to thrombocytopenia. Platelets on slow upward trend 58---> 82.  10. BPH s/p TURP: Foley to continue due to continue hematuria.  Hematuria present. Blood counts are increasing  -will need to go home with cath 11. Dizziness and nausea- appears to be BPPV related to chronic ear surgery,  ?fluid collection in ear  -scopolamine patch has helped  -vestibular rx per PT LOS (Days) 7 A FACE TO FACE EVALUATION WAS PERFORMED  SWARTZ,ZACHARY T 02/18/2012, 7:19 AM

## 2012-02-18 NOTE — Progress Notes (Signed)
Social Work Patient ID: Tracy [name], male   DOB: [birth year], 78 y.o.   MRN: [id number]  Have reviewed team conference this week with patient and wife.  Both aware and agreeable with targeted d/c 11/26 (plan for first HD treatment at outpatient center on 11/27), however, pt with much dizziness yesterday and wife concerned.  Pt much better today and feels he will be ready on targeted date.  Therapies also feel we can continue to plan for 11/26 d/c.  Have contacted Bard Herbert, PA-C with [facility] and informed her of targeted d/c date.  She will coordinate changing of HD schedule to get him onto MWF schedule for d/c.    Kaitrin Seybold

## 2012-02-18 NOTE — Progress Notes (Signed)
Pt OOB to W/C with OT;  B/P sitting 107/55 HRR at 87; Standing B/P 80/39 HRR at 76. C/O dizziness, pt. Has THT's on,no binder. Will monitor.

## 2012-02-18 NOTE — Progress Notes (Signed)
Physical Therapy Note  Patient Details  Name: Tracy [name] MRN: [id number] Date of Birth: [birth year] Today's Date: 02/18/2012  Time: 1030-1125 55 minutes  No c/o pain.  Pt reports dizziness and nausea have improved since yesterday.  Gait training for balance and endurance with RW multiple attempts of 50'-100' with pt requiring min A, frequent standing rest breaks today.  Pt more off balance with delayed balance reactions requiring min A to correct.  Car transfers to simulated sedan and truck heights.  Pt able to perform with min A for balance, pt able to get legs into car without assistance.   Household gait for balance with min A, cues for safety with RW.  Supine <> sit transfers with mod I.  Pt c/o brief episodes of dizziness with transitions but resolves in < 1 minute.  At conclusion of session pt/wife inquired about use of rollator vs. RW. PT explained difference in devices and reason for using RW at this time due to balance issues, pt to practice with rollator at a later time with PT.  Individual therapy   Everett Ehrler 02/18/2012, 11:28 AM

## 2012-02-18 NOTE — Progress Notes (Signed)
Physical Therapy Session Note  Patient Details  Name: BUNNIE LEDERMAN MRN: [id number] Date of Birth: April 14, 1919  Today's Date: 02/18/2012 Time: 0865-7846 Time Calculation (min): 30 min  Short Term Goals: Week 1:  PT Short Term Goal 1 (Week 1): STG=LTG due to LOS, S overall  Skilled Therapeutic Interventions/Progress Updates:    Pt sit-stand with min-min guard assist from various chair heights.  Ambulated 60' x 4 with 4 wheeled RW/rollator and seated rest breaks in between.  Performed B LE LAQ and hip flexion.  Pt needs cues for safety especially with sit-stand to get close enough to chair or seated RW before attempting to sit.  Pt states he prefers 4 wheeled RW/rollator over RW.    Therapy Documentation Precautions:  Precautions Precautions: Fall Precaution Comments: Monitor vitals Restrictions Weight Bearing Restrictions: No General: Amount of Missed PT Time (min): 15 Minutes Missed Time Reason: Other (comment) (Pt eating lunch-late tray)  Pain:  No c/o pain during treatment.  Locomotion : Ambulation Ambulation/Gait Assistance: 4: Min assist  Trunk/Postural Assessment :    See FIM for current functional status  Therapy/Group: Individual Therapy  Newell Coral 02/18/2012, 3:05 PM

## 2012-02-18 NOTE — Progress Notes (Signed)
Occupational Therapy Session Note  Patient Details  Name: Tracy [name] MRN: [id number] Date of Birth: 07/10/1919  Today's Date: 02/18/2012 Time: 9147-8295 Time Calculation (min): 29 min  Skilled Therapeutic Interventions/Progress Updates:    Pt performed 2 sets of 10 repetitions of each shoulder and arm exercises listed below.  Also utilized 11/2 lb dumbbells for bilateral shoulder press and forward press for 1 min intervals as well.  Pt performed all exercises in supine with HOB elevated.    Therapy Documentation Precautions:  Precautions Precautions: Fall Precaution Comments: Monitor vitals Restrictions Weight Bearing Restrictions: No  Pain: Pain Assessment Pain Assessment: No/denies pain  Exercises: General Exercises - Upper Extremity Shoulder Flexion: Strengthening;Both;10 reps;Prone Theraband Level (Shoulder Flexion): Level 1 (Yellow) Shoulder Extension: Strengthening;Theraband;10 reps;Supine;Both Theraband Level (Shoulder Extension): Level 1 (Yellow) Shoulder Horizontal ABduction: Strengthening;Both;10 reps;Theraband Theraband Level (Shoulder Horizontal Abduction): Level 1 (Yellow) Elbow Flexion: Strengthening;Both;10 reps;Theraband;Supine Theraband Level (Elbow Flexion): Level 1 (Yellow) Elbow Extension: Strengthening;Both;15 reps;Theraband Theraband Level (Elbow Extension): Level 1 (Yellow)  See FIM for current functional status  Therapy/Group: Individual Therapy  Latronda Spink OTR/L 02/18/2012, 4:07 PM

## 2012-02-19 ENCOUNTER — Inpatient Hospital Stay (HOSPITAL_COMMUNITY): Payer: Medicare Other | Admitting: Physical Therapy

## 2012-02-19 NOTE — Progress Notes (Signed)
[facility] Progress Note  Subjective:   Dizziness better, standing BP 109 today  10 pt ROS asked and answered.  All systems negative except as described above.  Objective Filed Vitals:   02/18/12 2029 02/19/12 0641 02/19/12 0644 02/19/12 0646  BP: 118/62 120/54 118/63 109/67  Pulse: 90 89 83 85  Temp:  98.1 F (36.7 C)    TempSrc:  Oral    Resp:  19    Weight:  66.9 kg (147 lb 7.8 oz)    SpO2:  96%     Physical Exam General: Alert, cooperative, NAD Heart: RRR , no murmur, rub or gallop appreciated Lungs: Clear bilaterally.  No wheezes or rhonchi Abdomen: Soft, non-tender, non-distended. (+) bowel sounds Extremities: Support stockings. Large area of ecchymosis over upper and lower left arm. Dialysis Access: Right I-J.  Left upper AVF maturing, bandaged, bruit audible through dressing  Dialysis Orders:  New start, will be MWF @ [facility] as outpatient. Optiflux 180. 2 K+/ 2.50 Ca++ Bath. Estimated EDW to be determined at d/c. BFR: 350/ DFR: A1.5. Right I-J.    Assessment/Plan:  1. Acute on chronic renal failure, new ESRD - now on HD w ESRD (first HD on 11/9 with temporary catheter); received R IJ catheter with creation of AVF per Dr. Edilia Bo 11/12; outpatient HD will be on MWF @ [facility]. HD MWF as inpt, will have HD Sunday instead of Monday due to inpatient holiday schedule. No heparin for nown (low plts, hematuria) 2. Dizziness/ AMS - Alert and oriented x 3.  Dizziness improving  3. HTN/volume - BP's trending in the Low 100's systolic. On Lopressor 12.5 bid. Dizziness better. Volume is on low side, weight down 91 > 69 kg since starting HD. Weight dropped this past week to 67 kg despite no UF with HD. No UF with HD this week unless signs of volume overload. 4. Anemia -  Hgb down 7.8, myelodysplastic syndrome is likely contributor. Tsat 37%, Ferritin 337. Continue Aranesp weekly.  Ferrlecit test dose tomorrow then 125 mg IV x 8 doses. 5. Secondary hyperparathyroidism - Ca  (corrected) 9.3, Phos 4.0, at goal. Not currently on a binder. Will continue to monitor. 6. Nutrition -  Albumin improving 2.6. Currently on Breeze supplement. Will continue to monitor.  7. Deconditioning - Began rehab on 02/11/12.  8. Thrombocytopenia/Hematuria: per primary. No heparin HD 9. Myelodysplastic syndrome  10. Hx CVA  11. CAD/ Hx Afib  12. Hx BPH - s/p TURP on 10/25 per Dr. Alexia Freestone.    [name]  MD [facility] [phone] pgr    [phone] cell 02/19/2012, 11:43 AM      Additional Objective Labs: Basic Metabolic Panel:  Lab 02/17/12 9629 02/13/12 0731  NA 139 136  K 3.6 4.2  CL 99 99  CO2 27 26  GLUCOSE 104* 95  BUN 19 38*  CREATININE 6.32* 7.91*  CALCIUM 8.2* 8.0*  ALB -- --  PHOS 4.0 4.6   Liver Function Tests:  Lab 02/17/12 1630 02/13/12 0731  AST -- --  ALT -- --  ALKPHOS -- --  BILITOT -- --  PROT -- --  ALBUMIN 2.6* 2.3*   CBC:  Lab 02/17/12 1630 02/13/12 0731  WBC 7.2 11.8*  NEUTROABS -- --  HGB 7.8* 8.3*  HCT 25.1* 26.8*  MCV 90.3 92.4  PLT 100* 91*    Medications:      . ciprofloxacin-dexamethasone  4 drop Left Ear BID  . darbepoetin  100 mcg Intravenous Q  Thu-HD  . ferric gluconate (FERRLECIT/NULECIT) Test Dose  25 mg Intravenous Once in dialysis  . metoprolol tartrate  12.5 mg Oral BID  . multivitamin  1 tablet Oral QHS  . scopolamine  1 patch Transdermal Q72H

## 2012-02-19 NOTE — Progress Notes (Signed)
Physical Therapy Note  Patient Details  Name: KDEN SEEBER MRN: [id number] Date of Birth: 1919-05-31 Today's Date: 02/19/2012  Time: 1425-1440 15 minutes  No c/o pain.  Upon supine to sit to pt's L side pt with severe dizziness, max A to keep sitting balance edge of bed.  Pt attempted again more slowly and able to sit.  Pt required 3 minutes for dizziness to resolve.  Sit to stand with min steadying assist.  Pt able to gait with rollator x 15' then limited by dizziness requiring 3 minute rest.  Upon attempt to stand pt unable to stand upright, required max A for balance due to dizziness.  Pt required 2 minute rest before being able to gait back to bed.  Pt upset that "my body is willing but I just can't".   Individual therapy   Oley Lahaie 02/19/2012, 2:43 PM

## 2012-02-19 NOTE — Progress Notes (Signed)
Patient ID: Tracy [name], male   DOB: 1920-02-28, 78 y.o.   MRN: [id number]    Subjective/Complaints: Dizziness improving daily. Feels he's getting stronger. No complaints today  Review of Systems  Genitourinary: Positive for hematuria.   .  All other systems reviewed and are negative.    Objective: Vital Signs: Blood pressure 109/67, pulse 85, temperature 98.1 F (36.7 C), temperature source Oral, resp. rate 19, weight 66.9 kg (147 lb 7.8 oz), SpO2 96.00%. No results found. Results for orders placed during the hospital encounter of 02/11/12 (from the past 72 hour(s))  CBC     Status: Abnormal   Collection Time   02/17/12  4:30 PM      Component Value Range Comment   WBC 7.2  4.0 - 10.5 K/uL    RBC 2.78 (*) 4.22 - 5.81 MIL/uL    Hemoglobin 7.8 (*) 13.0 - 17.0 g/dL    HCT 40.9 (*) 81.1 - 52.0 %    MCV 90.3  78.0 - 100.0 fL    MCH 28.1  26.0 - 34.0 pg    MCHC 31.1  30.0 - 36.0 g/dL    RDW 91.4 (*) 78.2 - 15.5 %    Platelets 100 (*) 150 - 400 K/uL CONSISTENT WITH PREVIOUS RESULT  RENAL FUNCTION PANEL     Status: Abnormal   Collection Time   02/17/12  4:30 PM      Component Value Range Comment   Sodium 139  135 - 145 mEq/L    Potassium 3.6  3.5 - 5.1 mEq/L    Chloride 99  96 - 112 mEq/L    CO2 27  19 - 32 mEq/L    Glucose, Bld 104 (*) 70 - 99 mg/dL    BUN 19  6 - 23 mg/dL    Creatinine, Ser 9.56 (*) 0.50 - 1.35 mg/dL    Calcium 8.2 (*) 8.4 - 10.5 mg/dL    Phosphorus 4.0  2.3 - 4.6 mg/dL    Albumin 2.6 (*) 3.5 - 5.2 g/dL    GFR calc non Af Amer 7 (*) >90 mL/min    GFR calc Af Amer 8 (*) >90 mL/min      HEENT: normal Cardio: irregular Resp: CTA B/L GI: BS positive Extremity:  No Edema.   Skin:   Wound redness L antecubital fossa, with bruising but swelling and tenderness much improved. Neuro: Alert/Oriented. Moves all 4's. Cognitively intact. nygstagmus1 beatwith right lateral gaze, no nystagmus with left gaze.  Musc/Skel: right knee tender over patella. Minimal  swelling Gen NAD Urine red but non-cloudy   Assessment/Plan: 1. Functional deficits secondary to deconditioning acute on chronic renal failure, anemia due to hematuria which require 3+ hours per day of interdisciplinary therapy in a comprehensive inpatient rehab setting. Physiatrist is providing close team supervision and 24 hour management of active medical problems listed below. Physiatrist and rehab team continue to assess barriers to discharge/monitor patient progress toward functional and medical goals. FIM: FIM - Bathing Bathing Steps Patient Completed: Chest;Right Arm;Left Arm;Abdomen;Front perineal area;Buttocks;Right lower leg (including foot);Left upper leg;Right upper leg;Left lower leg (including foot) Bathing: 5: Set-up assist to: Adjust water temp  FIM - Upper Body Dressing/Undressing Upper body dressing/undressing steps patient completed: Thread/unthread right sleeve of pullover shirt/dresss;Thread/unthread left sleeve of pullover shirt/dress;Put head through opening of pull over shirt/dress;Pull shirt over trunk Upper body dressing/undressing: 5: Supervision: Safety issues/verbal cues FIM - Lower Body Dressing/Undressing Lower body dressing/undressing steps patient completed: Thread/unthread right underwear leg;Thread/unthread left underwear leg;Thread/unthread  right pants leg;Pull underwear up/down;Thread/unthread left pants leg;Pull pants up/down;Don/Doff right sock;Don/Doff left sock Lower body dressing/undressing: 5: Set-up assist to: Don/Doff TED stocking  FIM - Toileting Toileting steps completed by patient: Adjust clothing after toileting;Adjust clothing prior to toileting Toileting: 3: Mod-Patient completed 2 of 3 steps  FIM - Diplomatic Services operational officer Devices: Elevated toilet seat;Walker Toilet Transfers: 4-To toilet/BSC: Min A (steadying Pt. > 75%);4-From toilet/BSC: Min A (steadying Pt. > 75%)  FIM - Bed/Chair Transfer Bed/Chair Transfer  Assistive Devices: Therapist, occupational: 4: Bed > Chair or W/C: Min A (steadying Pt. > 75%);4: Chair or W/C > Bed: Min A (steadying Pt. > 75%)  FIM - Locomotion: Wheelchair Distance: 40 Locomotion: Wheelchair: 1: Travels less than 50 ft with minimal assistance (Pt.>75%) FIM - Locomotion: Ambulation Locomotion: Ambulation Assistive Devices: Other (comment) (4 wheeled walker) Ambulation/Gait Assistance: 4: Min assist Locomotion: Ambulation: 2: Travels 50 - 149 ft with minimal assistance (Pt.>75%)  Comprehension Comprehension Mode: Auditory Comprehension: 6-Follows complex conversation/direction: With extra time/assistive device  Expression Expression Mode: Verbal Expression: 6-Expresses complex ideas: With extra time/assistive device  Social Interaction Social Interaction: 6-Interacts appropriately with others with medication or extra time (anti-anxiety, antidepressant).  Problem Solving Problem Solving: 5-Solves complex 90% of the time/cues < 10% of the time  Memory Memory: 4-Recognizes or recalls 75 - 89% of the time/requires cueing 10 - 24% of the time  Medical Problem List and Plan:  1. DVT Prophylaxis/Anticoagulation: Mechanical: Sequential compression devices, below knee Bilateral lower extremities  2. Pain Management: prn hydrocodone effective  -add ice for left knee 3. Mood:  Offer ego support.  4. Neuropsych: This patient is capable of making decisions on his/her own behalf.  5. Acute on chronic renal failure: Now HD dependent. Continue HD T, T, S. Daily weights. Renal diet.   - RD assistance with food choices appreciated 6. Atrial fibrillation: Monitor HR with bid checks. Continue metoprolol with hold parameters for orthostasis  -continue TEDS, precautions for orthostasis  -Off coumadin due to recent bleeding events.  7. Mixed CHF with pleural effusion: Appears to be compensated on dialysis and FR. Continue daily weights.  8. Acute on chronic anemia: On aranesp  weekly. Continue to monitor H/H with HD. Has been relatively stable despite ongoing urethral bleeding.  9. MDS: likely contributing to thrombocytopenia. Platelets on slow upward trend 58---> 82.  10. BPH s/p TURP: Foley to continue due to continue hematuria.  Hematuria present. Blood counts are increasing  -will need to go home with cath 11. Dizziness and nausea- appears to be BPPV related to chronic ear surgery,  ?fluid collection in ear  -scopolamine patch has helped  -vestibular rx per PT  -cipro gtts for perfed ear drum LOS (Days) 8 A FACE TO FACE EVALUATION WAS PERFORMED  Tracy [name] T 02/19/2012, 7:45 AM

## 2012-02-20 ENCOUNTER — Encounter (HOSPITAL_COMMUNITY): Payer: Medicare Other | Admitting: Occupational Therapy

## 2012-02-20 LAB — RENAL FUNCTION PANEL
Albumin: 2.6 g/dL — ABNORMAL LOW (ref 3.5–5.2)
BUN: 20 mg/dL (ref 6–23)
CO2: 27 meq/L (ref 19–32)
Calcium: 8.5 mg/dL (ref 8.4–10.5)
Chloride: 99 meq/L (ref 96–112)
Creatinine, Ser: 7.43 mg/dL — ABNORMAL HIGH (ref 0.50–1.35)
GFR calc Af Amer: 7 mL/min — ABNORMAL LOW
GFR calc non Af Amer: 6 mL/min — ABNORMAL LOW
Glucose, Bld: 124 mg/dL — ABNORMAL HIGH (ref 70–99)
Phosphorus: 4.4 mg/dL (ref 2.3–4.6)
Potassium: 4.1 meq/L (ref 3.5–5.1)
Sodium: 136 meq/L (ref 135–145)

## 2012-02-20 LAB — CBC
Platelets: 111 10*3/uL — ABNORMAL LOW (ref 150–400)
RBC: 2.76 MIL/uL — ABNORMAL LOW (ref 4.22–5.81)
WBC: 4.8 10*3/uL (ref 4.0–10.5)

## 2012-02-20 NOTE — Progress Notes (Signed)
Occupational Therapy Session Note  Patient Details  Name: [name] MRN: [id number] Date of Birth: Aug 27, 1919  Today's Date: 02/20/2012 Time: 9528-4132 Time Calculation (min): 42 min  Skilled Therapeutic Interventions/Progress Updates:    Pt seen for OT treatment focused on bathing and dressing sit to stand at the sink.  Pt needing occasional min assist for sit to stand and dynamic standing balance when attempting to pull pants over hips and wash perianal area.  Pt only washing his UB and peri area.  Did not attempt other bathing.  Also worked on Physiological scientist.  Pt with increased swaying just with static standing and LOB noted with head flexion and extension.  Pt at times impulsive and attempting to sit and stand without therapist in the proper positioning.  Pt with one severe LOB to the right when attempting to sit down.      Therapy Documentation Precautions:  Precautions Precautions: Fall Precaution Comments: Monitor vitals Restrictions Weight Bearing Restrictions: No  Vital Signs: Therapy Vitals Temp: 97 F (36.1 C) Temp src: Oral Pulse Rate: 94  Resp: 16  BP: 131/63 mmHg Patient Position, if appropriate: Lying Oxygen Therapy SpO2: 97 % O2 Device: None (Room air) Pain: Pain Assessment Pain Assessment: No/denies pain  See FIM for current functional status  Therapy/Group: Individual Therapy  Sudeep Scheibel OTR/L 02/20/2012, 10:33 AM

## 2012-02-20 NOTE — Procedures (Signed)
I was present at this dialysis session. I have reviewed the session itself and made appropriate changes.   Rob Tacori Kvamme, MD [facility] 02/20/2012, 10:23 AM   

## 2012-02-20 NOTE — Progress Notes (Signed)
Patient ID: Tracy [name], male   DOB: 08-04-1919, 78 y.o.   MRN: [id number]    Subjective/Complaints: Dizziness this morning, but overall improved Review of Systems  Genitourinary: Positive for hematuria.   .  All other systems reviewed and are negative.    Objective: Vital Signs: Blood pressure 105/80, pulse 92, temperature 97.9 F (36.6 C), temperature source Oral, resp. rate 17, weight 68.2 kg (150 lb 5.7 oz), SpO2 97.00%. No results found. Results for orders placed during the hospital encounter of 02/11/12 (from the past 72 hour(s))  CBC     Status: Abnormal   Collection Time   02/17/12  4:30 PM      Component Value Range Comment   WBC 7.2  4.0 - 10.5 K/uL    RBC 2.78 (*) 4.22 - 5.81 MIL/uL    Hemoglobin 7.8 (*) 13.0 - 17.0 g/dL    HCT 40.9 (*) 81.1 - 52.0 %    MCV 90.3  78.0 - 100.0 fL    MCH 28.1  26.0 - 34.0 pg    MCHC 31.1  30.0 - 36.0 g/dL    RDW 91.4 (*) 78.2 - 15.5 %    Platelets 100 (*) 150 - 400 K/uL CONSISTENT WITH PREVIOUS RESULT  RENAL FUNCTION PANEL     Status: Abnormal   Collection Time   02/17/12  4:30 PM      Component Value Range Comment   Sodium 139  135 - 145 mEq/L    Potassium 3.6  3.5 - 5.1 mEq/L    Chloride 99  96 - 112 mEq/L    CO2 27  19 - 32 mEq/L    Glucose, Bld 104 (*) 70 - 99 mg/dL    BUN 19  6 - 23 mg/dL    Creatinine, Ser 9.56 (*) 0.50 - 1.35 mg/dL    Calcium 8.2 (*) 8.4 - 10.5 mg/dL    Phosphorus 4.0  2.3 - 4.6 mg/dL    Albumin 2.6 (*) 3.5 - 5.2 g/dL    GFR calc non Af Amer 7 (*) >90 mL/min    GFR calc Af Amer 8 (*) >90 mL/min      HEENT: normal Cardio: irregular Resp: CTA B/L GI: BS positive Extremity:  No Edema.   Skin:   Wound redness L antecubital fossa, with bruising but swelling and tenderness much improved. Neuro: Alert/Oriented. Moves all 4's. Cognitively intact. nystagmus1-2 beatwith right lateral gaze, no nystagmus with left gaze.  Musc/Skel: right knee tender over patella. Minimal swelling Gen NAD Urine red but  non-cloudy   Assessment/Plan: 1. Functional deficits secondary to deconditioning acute on chronic renal failure, anemia due to hematuria which require 3+ hours per day of interdisciplinary therapy in a comprehensive inpatient rehab setting. Physiatrist is providing close team supervision and 24 hour management of active medical problems listed below. Physiatrist and rehab team continue to assess barriers to discharge/monitor patient progress toward functional and medical goals. FIM: FIM - Bathing Bathing Steps Patient Completed: Chest;Right Arm;Left Arm;Abdomen;Front perineal area;Buttocks;Right lower leg (including foot);Left upper leg;Right upper leg;Left lower leg (including foot) Bathing: 5: Set-up assist to: Adjust water temp  FIM - Upper Body Dressing/Undressing Upper body dressing/undressing steps patient completed: Thread/unthread right sleeve of pullover shirt/dresss;Thread/unthread left sleeve of pullover shirt/dress;Put head through opening of pull over shirt/dress;Pull shirt over trunk Upper body dressing/undressing: 5: Supervision: Safety issues/verbal cues FIM - Lower Body Dressing/Undressing Lower body dressing/undressing steps patient completed: Thread/unthread right underwear leg;Thread/unthread left underwear leg;Thread/unthread right pants leg;Pull underwear up/down;Thread/unthread  left pants leg;Pull pants up/down;Don/Doff right sock;Don/Doff left sock Lower body dressing/undressing: 5: Set-up assist to: Don/Doff TED stocking  FIM - Toileting Toileting steps completed by patient: Adjust clothing after toileting;Adjust clothing prior to toileting Toileting: 3: Mod-Patient completed 2 of 3 steps  FIM - Diplomatic Services operational officer Devices: Elevated toilet seat;Walker Toilet Transfers: 4-To toilet/BSC: Min A (steadying Pt. > 75%);4-From toilet/BSC: Min A (steadying Pt. > 75%)  FIM - Bed/Chair Transfer Bed/Chair Transfer Assistive Devices: Diplomatic Services operational officer: 4: Bed > Chair or W/C: Min A (steadying Pt. > 75%);4: Chair or W/C > Bed: Min A (steadying Pt. > 75%)  FIM - Locomotion: Wheelchair Distance: 40 Locomotion: Wheelchair: 1: Travels less than 50 ft with minimal assistance (Pt.>75%) FIM - Locomotion: Ambulation Locomotion: Ambulation Assistive Devices: Other (comment) (4 wheeled walker) Ambulation/Gait Assistance: 4: Min assist Locomotion: Ambulation: 2: Travels 50 - 149 ft with minimal assistance (Pt.>75%)  Comprehension Comprehension Mode: Auditory Comprehension: 6-Follows complex conversation/direction: With extra time/assistive device  Expression Expression Mode: Verbal Expression: 6-Expresses complex ideas: With extra time/assistive device  Social Interaction Social Interaction: 6-Interacts appropriately with others with medication or extra time (anti-anxiety, antidepressant).  Problem Solving Problem Solving: 5-Solves complex 90% of the time/cues < 10% of the time  Memory Memory: 4-Recognizes or recalls 75 - 89% of the time/requires cueing 10 - 24% of the time  Medical Problem List and Plan:  1. DVT Prophylaxis/Anticoagulation: Mechanical: Sequential compression devices, below knee Bilateral lower extremities  2. Pain Management: prn hydrocodone effective  -add ice for left knee 3. Mood:  Offer ego support.  4. Neuropsych: This patient is capable of making decisions on his/her own behalf.  5. Acute on chronic renal failure: Now HD dependent. Continue HD T, T, S. Daily weights. Renal diet.   - RD assistance with food choices appreciated 6. Atrial fibrillation: Monitor HR with bid checks. Continue metoprolol with hold parameters for orthostasis  -continue TEDS, precautions for orthostasis  -Off coumadin due to recent bleeding events.  7. Mixed CHF with pleural effusion: Appears to be compensated on dialysis and FR. Continue daily weights.  8. Acute on chronic anemia: On aranesp weekly. Continue to monitor H/H with  HD. Has been relatively stable despite ongoing urethral bleeding.  9. MDS: likely contributing to thrombocytopenia. Platelets on slow upward trend 58---> 82.  10. BPH s/p TURP: Foley to continue due to continue hematuria.  Hematuria present. Blood counts are increasing  -will need to go home with cath 11. Dizziness and nausea- appears to be BPPV related to chronic ear surgery,  ?fluid collection in ear  -scopolamine patch has helped  -vestibular rx per PT  -cipro gtts for perfed ear drum  -provided further education for patient today on pathology LOS (Days) 9 A FACE TO FACE EVALUATION WAS PERFORMED  SWARTZ,ZACHARY T 02/20/2012, 7:48 AM

## 2012-02-21 ENCOUNTER — Inpatient Hospital Stay (HOSPITAL_COMMUNITY): Payer: Medicare Other

## 2012-02-21 ENCOUNTER — Inpatient Hospital Stay (HOSPITAL_COMMUNITY): Payer: Medicare Other | Admitting: Occupational Therapy

## 2012-02-21 ENCOUNTER — Inpatient Hospital Stay (HOSPITAL_COMMUNITY): Payer: Medicare Other | Admitting: Physical Therapy

## 2012-02-21 DIAGNOSIS — N19 Unspecified kidney failure: Secondary | ICD-10-CM

## 2012-02-21 DIAGNOSIS — R339 Retention of urine, unspecified: Secondary | ICD-10-CM

## 2012-02-21 DIAGNOSIS — R5381 Other malaise: Secondary | ICD-10-CM

## 2012-02-21 MED ORDER — WHITE PETROLATUM GEL
Status: AC
  Filled 2012-02-21: qty 5

## 2012-02-21 NOTE — Progress Notes (Signed)
Physical Therapy Note  Patient Details  Name: Tracy [name] MRN: [id number] Date of Birth: 1919-09-17 Today's Date: 02/21/2012  Time: 1015-1110 55 minutes  No c/o pain.  Pt continues with c/o dizziness but willing to participate in session.  Gait with rollator with min A, max cuing for safety.  Pt tends to be impulsive when fatigued, requiring min A to correct LOB when turning to sit.  Occasional episodes of knee buckling with pt able to correct when holding to rollator.  Pt requires frequent seated rests due to low activity tolerance and dizziness.  Stair training 2 x 5 stairs with min A initially, pt able to progress to supervision with cuing to take his time and move slowly.  Nu step training for activity tolerance 2 x 3 minutes with prolonged rest in between due to fatigue.  Pt requires frequent rest breaks but is able to participate.  Standing balance with min A with and without AD, pt with decreased awareness of LOB, delayed balance reactions, min A to correct.  Pt continues to be limited by dizziness requiring increased assistance with all mobility.  Individual therapy   [name] 02/21/2012, 11:19 AM

## 2012-02-21 NOTE — Progress Notes (Shared)
PULMONARY REHAB OUTCOMES REPORT  Patient has been in the hospital the last several weeks and as of 01/21/2012 he is in acute rehab. Patient was only able to complete 3 exercise sessions so we do not have any real showings of improvement or understandings of pursed lip breathing. Patient did not met the program goals or the personal goals.  Patient needs plenty of recovery time and may come back as soon as he is ready with a new MD order. Exercise sessions have been scanned in. Thank you for referral.   Agree with the above, when other medical issues are resolved and he is physically stable to attend, we will be happy to readmit and work with this very nice gentleman.    Cathie Olden RN

## 2012-02-21 NOTE — Progress Notes (Signed)
Occupational Therapy Session Note  Patient Details  Name: Tracy [name] MRN: [id number] Date of Birth: [birth year]  Today's Date: 02/21/2012 Time: 0900-0955 Time Calculation (min): 55 min  Short Term Goals: Week 2:  OT Short Term Goal 1 (Week 2): LTG=STG OT Short Term Goal 2 (Week 2): Complete family education with pt's wife  Skilled Therapeutic Interventions/Progress Updates:    Pt resting in bed upon arrival but agreeable to participating in bathing and dressing with sit<>stand from w/c at sink.  Pt close supervision for all bathing and dressing tasks.  Pt requested to use toilet and required min A for toilet transfer with max verbal cues for safety awareness.  Pt exhibits significant anterior lean when standing, transferring, and ambulating with RW.  Pt transitioned to ADL apartment to practice tub bench transfer with min A.  Pt states he already owns tub bench from prior knee surgery.  Therapy Documentation Precautions:  Precautions Precautions: Fall Precaution Comments: Monitor vitals Restrictions Weight Bearing Restrictions: No Pain: Pain Assessment Pain Assessment: No/denies pain  See FIM for current functional status  Therapy/Group: Individual Therapy  Rich Brave 02/21/2012, 9:57 AM

## 2012-02-21 NOTE — Progress Notes (Signed)
Nutrition Follow-up  Intervention:   Continue current diet Provide Preferences Discussed supplement use after D/C.  Encouraged pt to discuss any questions with RD at HD center outpatient. Encouraged po intake  Assessment:   Pt continues to have problems with dizziness.  Eating a little more today with visit at lunch.  Obtained items missing from tray and appropriate for diet.     Diet Order:  80/90-2-2 RF 1200 ml FR  Meds: Scheduled Meds:   . ciprofloxacin-dexamethasone  4 drop Left Ear BID  . darbepoetin  100 mcg Intravenous Q Thu-HD  . [COMPLETED] ferric gluconate (FERRLECIT/NULECIT) Test Dose  25 mg Intravenous Once in dialysis  . metoprolol tartrate  12.5 mg Oral BID  . multivitamin  1 tablet Oral QHS  . scopolamine  1 patch Transdermal Q72H  . white petrolatum       Continuous Infusions:  PRN Meds:.sodium chloride, sodium chloride, acetaminophen, aluminum hydroxide, bisacodyl, guaiFENesin-dextromethorphan, HYDROcodone-acetaminophen, lidocaine, lidocaine-prilocaine, neomycin-bacitracin-polymyxin, ondansetron (ZOFRAN) IV, ondansetron, pentafluoroprop-tetrafluoroeth, polyethylene glycol, traZODone   CMP     Component Value Date/Time   NA 136 02/20/2012 0910   NA 136 01/12/2012 1307   K 4.1 02/20/2012 0910   K 5.0 01/12/2012 1307   CL 99 02/20/2012 0910   CL 111* 01/12/2012 1307   CO2 27 02/20/2012 0910   CO2 18* 01/12/2012 1307   GLUCOSE 124* 02/20/2012 0910   GLUCOSE 102* 01/12/2012 1307   BUN 20 02/20/2012 0910   BUN 41.0* 01/12/2012 1307   CREATININE 7.43* 02/20/2012 0910   CREATININE 2.7* 01/12/2012 1307   CALCIUM 8.5 02/20/2012 0910   CALCIUM 9.0 01/12/2012 1307   CALCIUM 11.0* 11/05/2011 1552   PROT 5.1* 02/01/2012 0815   PROT 6.5 01/12/2012 1307   ALBUMIN 2.6* 02/20/2012 0910   ALBUMIN 3.5 01/12/2012 1307   AST 52* 02/01/2012 0815   AST 26 01/12/2012 1307   ALT <5 02/01/2012 0815   ALT 37 01/12/2012 1307   ALKPHOS 80 02/01/2012 0815   ALKPHOS 73 01/12/2012  1307   BILITOT 0.6 02/01/2012 0815   BILITOT 2.20* 01/12/2012 1307   GFRNONAA 6* 02/20/2012 0910   GFRAA 7* 02/20/2012 0910    CBG (last 3)  No results found for this basename: GLUCAP:3 in the last 72 hours   Intake/Output Summary (Last 24 hours) at 02/21/12 1118 Last data filed at 02/21/12 0900  Gross per 24 hour  Intake    480 ml  Output     25 ml  Net    455 ml    Weight Status:  66 kg (11/24) post HD.  Weight 66-68 kg over the past week  Re-estimated needs:  2000-2100 kcal, 90-100 gm protein  Nutrition Dx:  Inadequate oral intake--ongoing  Goal:  Intake of >/=90% estimated needs with meals and supplements  Monitor:  Intake, labs, weight   Oran Rein, RD, LDN Clinical Inpatient Dietitian Pager:  [phone] Weekend and after hours pager:  [phone]

## 2012-02-21 NOTE — Progress Notes (Signed)
Occupational Therapy Session Note  Patient Details  Name: Tracy [name] MRN: [id number] Date of Birth: April 13, 1919  Today's Date: 02/21/2012 Time: 1130-1200 Time Calculation (min): 30 min  Skilled Therapeutic Interventions/Progress Updates:    1:1 Focus on functional ambulation on different floor surfaces, turning, safety with Rollator, activity tolerance. Pt significantly a high fall risk with Rolator with multiple LOB requiring mod A to correct; pt with no awareness of significant LOB. Pt unable to safely control it and turn around and sit on it. Pt with no c/o dizziness.   Therapy Documentation Precautions:  Precautions Precautions: Fall Precaution Comments: Monitor vitals Restrictions Weight Bearing Restrictions: No Pain: Pain Assessment Pain Assessment: No/denies pain  See FIM for current functional status  Therapy/Group: Individual Therapy  Roney Mans [facility] 02/21/2012, 12:23 PM

## 2012-02-21 NOTE — Progress Notes (Signed)
Patient ID: Tracy [name], male   DOB: [birth year], 78 y.o.   MRN: [id number]    Subjective/Complaints: Some spinnning, dizziness when awakening. Pain better.  Review of Systems  Genitourinary: Positive for hematuria.   .  All other systems reviewed and are negative.    Objective: Vital Signs: Blood pressure 139/76, pulse 58, temperature 97.2 F (36.2 C), temperature source Oral, resp. rate 16, weight 66 kg (145 lb 8.1 oz), SpO2 97.00%. No results found. Results for orders placed during the hospital encounter of 02/11/12 (from the past 72 hour(s))  CBC     Status: Abnormal   Collection Time   02/20/12  9:10 AM      Component Value Range Comment   WBC 4.8  4.0 - 10.5 K/uL    RBC 2.76 (*) 4.22 - 5.81 MIL/uL    Hemoglobin 7.8 (*) 13.0 - 17.0 g/dL    HCT 11.9 (*) 14.7 - 52.0 %    MCV 91.3  78.0 - 100.0 fL    MCH 28.3  26.0 - 34.0 pg    MCHC 31.0  30.0 - 36.0 g/dL    RDW 82.9 (*) 56.2 - 15.5 %    Platelets 111 (*) 150 - 400 K/uL CONSISTENT WITH PREVIOUS RESULT  RENAL FUNCTION PANEL     Status: Abnormal   Collection Time   02/20/12  9:10 AM      Component Value Range Comment   Sodium 136  135 - 145 mEq/L    Potassium 4.1  3.5 - 5.1 mEq/L    Chloride 99  96 - 112 mEq/L    CO2 27  19 - 32 mEq/L    Glucose, Bld 124 (*) 70 - 99 mg/dL    BUN 20  6 - 23 mg/dL    Creatinine, Ser 1.30 (*) 0.50 - 1.35 mg/dL    Calcium 8.5  8.4 - 86.5 mg/dL    Phosphorus 4.4  2.3 - 4.6 mg/dL    Albumin 2.6 (*) 3.5 - 5.2 g/dL    GFR calc non Af Amer 6 (*) >90 mL/min    GFR calc Af Amer 7 (*) >90 mL/min      HEENT: normal Cardio: irregular Resp: CTA B/L GI: BS positive Extremity:  No Edema.   Skin:   Wound redness L antecubital fossa, with bruising but swelling and tenderness much improved. Neuro: Alert/Oriented. Moves all 4's. Cognitively intact. nystagmus1-2 beatwith right lateral gaze, no nystagmus with left gaze.  Musc/Skel: right knee tender over patella. Minimal swelling--some improvement Gen  NAD Urine red but non-cloudy   Assessment/Plan: 1. Functional deficits secondary to deconditioning acute on chronic renal failure, anemia due to hematuria which require 3+ hours per day of interdisciplinary therapy in a comprehensive inpatient rehab setting. Physiatrist is providing close team supervision and 24 hour management of active medical problems listed below. Physiatrist and rehab team continue to assess barriers to discharge/monitor patient progress toward functional and medical goals. FIM: FIM - Bathing Bathing Steps Patient Completed: Chest;Right Arm;Left Arm;Front perineal area Bathing: 4: Steadying assist  FIM - Upper Body Dressing/Undressing Upper body dressing/undressing steps patient completed: Thread/unthread left sleeve of pullover shirt/dress;Pull shirt over trunk;Thread/unthread right sleeve of pullover shirt/dresss;Put head through opening of pull over shirt/dress Upper body dressing/undressing: 5: Set-up assist to: Obtain clothing/put away FIM - Lower Body Dressing/Undressing Lower body dressing/undressing steps patient completed: Thread/unthread left underwear leg;Thread/unthread right underwear leg Lower body dressing/undressing: 4: Steadying Assist  FIM - Toileting Toileting steps completed by patient: Adjust  clothing after toileting;Adjust clothing prior to toileting Toileting: 3: Mod-Patient completed 2 of 3 steps  FIM - Diplomatic Services operational officer Devices: Elevated toilet seat;Walker Toilet Transfers: 4-To toilet/BSC: Min A (steadying Pt. > 75%);4-From toilet/BSC: Min A (steadying Pt. > 75%)  FIM - Bed/Chair Transfer Bed/Chair Transfer Assistive Devices: Therapist, occupational: 5: Supine > Sit: Supervision (verbal cues/safety issues);4: Sit > Supine: Min A (steadying pt. > 75%/lift 1 leg)  FIM - Locomotion: Wheelchair Distance: 40 Locomotion: Wheelchair: 1: Travels less than 50 ft with minimal assistance (Pt.>75%) FIM - Locomotion:  Ambulation Locomotion: Ambulation Assistive Devices: Other (comment) (4 wheeled walker) Ambulation/Gait Assistance: 4: Min assist Locomotion: Ambulation: 2: Travels 50 - 149 ft with minimal assistance (Pt.>75%)  Comprehension Comprehension Mode: Auditory Comprehension: 6-Follows complex conversation/direction: With extra time/assistive device  Expression Expression Mode: Verbal Expression: 6-Expresses complex ideas: With extra time/assistive device  Social Interaction Social Interaction: 6-Interacts appropriately with others with medication or extra time (anti-anxiety, antidepressant).  Problem Solving Problem Solving: 5-Solves complex 90% of the time/cues < 10% of the time  Memory Memory: 4-Recognizes or recalls 75 - 89% of the time/requires cueing 10 - 24% of the time  Medical Problem List and Plan:  1. DVT Prophylaxis/Anticoagulation: Mechanical: Sequential compression devices, below knee Bilateral lower extremities  2. Pain Management: prn hydrocodone effective  -added ice for left knee 3. Mood:  Offer ego support.  4. Neuropsych: This patient is capable of making decisions on his/her own behalf.  5. Acute on chronic renal failure: Now HD dependent. Continue HD T, T, S. Daily weights. Renal diet.   - RD assistance with food choices appreciated 6. Atrial fibrillation: Monitor HR with bid checks. Continue metoprolol with hold parameters for orthostasis  -continue TEDS, precautions for orthostasis  -Off coumadin due to recent bleeding events.  7. Mixed CHF with pleural effusion: Appears to be compensated on dialysis and FR. Continue daily weights.  8. Acute on chronic anemia: On aranesp weekly. Continue to monitor H/H with HD. Has been relatively stable despite ongoing urethral bleeding.  9. MDS: likely contributing to thrombocytopenia. Platelets on slow upward trend 58---> 82.  10. BPH s/p TURP: Foley to continue due to continue hematuria.  Hematuria present. Blood counts are  increasing  -will need to go home with cath 11. Dizziness and nausea- appears to be BPPV related to chronic ear surgery,  ?fluid collection in ear  -scopolamine patch has helped  -vestibular rx per PT  -cipro gtts for perfed ear drum  LOS (Days) 10 A FACE TO FACE EVALUATION WAS PERFORMED  Jenniferann Stuckert T 02/21/2012, 7:50 AM

## 2012-02-21 NOTE — Progress Notes (Signed)
Physical Therapy Note  Patient Details  Name: Tracy [name] MRN: [id number] Date of Birth: 10-May-1919 Today's Date: 02/21/2012  1515-1600 (45 minutes) individual Pain: no reported pain Focus of treatment: Gait training with rollator/ without AD; therapeutic exercise for activity tolerance Treatment: transfers stand/pivot min to close SBA for safety; wc mobility 120 feet min to SBA ( pt occasionally needs assist to avoid objects or steer around obstacle); gait 100 feet rollator min assist for  Balance, pt impulsive and may attempt to change directions quickly to lean against wall. Requires max vcs to lock rollator brakes before sitting and to stay closer to AD during gait; Nustep Level 3 X 10 minutes with one rest break . (Oxygen sats >92% RA pulse 109). Pt returned to bed with bed alarm activated.    Graclyn Lawther,JIM 02/21/2012, 3:40 PM

## 2012-02-21 NOTE — Progress Notes (Signed)
[facility] Progress Note  Subjective:   Dizziness reportedly still present.  Had HD yesterday due to holiday schedule, no volume removal and no issues.    Patient participating in therapy this AM   Objective Filed Vitals:   02/20/12 1200 02/20/12 1317 02/20/12 1344 02/21/12 0500  BP: 138/73 134/68 144/71 139/76  Pulse: 94 87 93 58  Temp:  97.6 F (36.4 C) 97.3 F (36.3 C) 97.2 F (36.2 C)  TempSrc:  Oral Oral Oral  Resp:  16 16 16   Weight:  66 kg (145 lb 8.1 oz)    SpO2:  97% 96% 97%   Physical Exam General: Alert, cooperative, NAD Heart: RRR , no murmur, rub or gallop appreciated Lungs: Clear bilaterally.  No wheezes or rhonchi Abdomen: Soft, non-tender, non-distended. (+) bowel sounds Extremities: Support stockings. Large area of ecchymosis over upper and lower left arm. Dialysis Access: Right I-J.  Left upper AVF maturing, bandaged, bruit audible through dressing  Dialysis Orders:  New start, will be MWF @ [facility] as outpatient. Optiflux 180. 2 K+/ 2.50 Ca++ Bath. Estimated EDW to be determined at d/c. BFR: 350/ DFR: A1.5. Right I-J.    Assessment/Plan:  1. Acute on chronic renal failure, new ESRD - now on HD w ESRD (first HD on 11/9 with temporary catheter); received R IJ catheter with creation of AVF per Dr. Edilia Bo 11/12; outpatient HD will be on MWF @ [facility]. HD MWF as inpt, will have HD Sunday instead of Monday due to inpatient holiday schedule. No heparin for nown (low plts, hematuria).  Plan for next HD on Tuesday due to holiday schedule 2. Dizziness/ AMS - Alert and oriented x 3.  Dizziness improving  3. HTN/volume - BP's trending in the Low 100's systolic. On Lopressor 12.5 bid. Dizziness better. Volume is on low side, weight down 91 > 69 kg since starting HD. Weight dropped this past week to 67 kg despite no UF with HD. No UF with HD as tolerated. 4. Anemia -  Hgb down 7.8, myelodysplastic syndrome is likely contributor. Tsat 37%, Ferritin 337. Continue  Aranesp weekly.  Ferrlecit test dose tomorrow then 125 mg IV x 8 doses. 5. Secondary hyperparathyroidism - Ca (corrected) 9.3, Phos 4.0, at goal. Not currently on a binder. Will continue to monitor. 6. Nutrition -  Albumin improving 2.6. Currently on Breeze supplement. Will continue to monitor.  7. Deconditioning - Began rehab on 02/11/12.  8. Thrombocytopenia/Hematuria: per primary. No heparin HD 9. Myelodysplastic syndrome  10. Hx CVA  11. CAD/ Hx Afib  12. Hx BPH - s/p TURP on 10/25 per Dr. Alexia Freestone.    Annie Sable  MD [facility]  02/21/2012, 9:39 AM      Additional Objective Labs: Basic Metabolic Panel:  Lab 02/20/12 1610 02/17/12 1630  NA 136 139  K 4.1 3.6  CL 99 99  CO2 27 27  GLUCOSE 124* 104*  BUN 20 19  CREATININE 7.43* 6.32*  CALCIUM 8.5 8.2*  ALB -- --  PHOS 4.4 4.0   Liver Function Tests:  Lab 02/20/12 0910 02/17/12 1630  AST -- --  ALT -- --  ALKPHOS -- --  BILITOT -- --  PROT -- --  ALBUMIN 2.6* 2.6*   CBC:  Lab 02/20/12 0910 02/17/12 1630  WBC 4.8 7.2  NEUTROABS -- --  HGB 7.8* 7.8*  HCT 25.2* 25.1*  MCV 91.3 90.3  PLT 111* 100*    Medications:      . ciprofloxacin-dexamethasone  4  drop Left Ear BID  . darbepoetin  100 mcg Intravenous Q Thu-HD  . [COMPLETED] ferric gluconate (FERRLECIT/NULECIT) Test Dose  25 mg Intravenous Once in dialysis  . metoprolol tartrate  12.5 mg Oral BID  . multivitamin  1 tablet Oral QHS  . scopolamine  1 patch Transdermal Q72H

## 2012-02-22 ENCOUNTER — Encounter (HOSPITAL_COMMUNITY): Payer: Medicare Other | Admitting: Occupational Therapy

## 2012-02-22 ENCOUNTER — Encounter (HOSPITAL_COMMUNITY): Admission: RE | Admit: 2012-02-22 | Payer: Medicare Other | Source: Ambulatory Visit

## 2012-02-22 ENCOUNTER — Inpatient Hospital Stay (HOSPITAL_COMMUNITY): Payer: Medicare Other | Admitting: Physical Therapy

## 2012-02-22 LAB — CBC
Hemoglobin: 7.7 g/dL — ABNORMAL LOW (ref 13.0–17.0)
MCV: 92 fL (ref 78.0–100.0)
Platelets: 137 10*3/uL — ABNORMAL LOW (ref 150–400)
RBC: 2.63 MIL/uL — ABNORMAL LOW (ref 4.22–5.81)
WBC: 5.6 10*3/uL (ref 4.0–10.5)

## 2012-02-22 LAB — RENAL FUNCTION PANEL
CO2: 27 mEq/L (ref 19–32)
Chloride: 98 mEq/L (ref 96–112)
Creatinine, Ser: 6.54 mg/dL — ABNORMAL HIGH (ref 0.50–1.35)
GFR calc Af Amer: 8 mL/min — ABNORMAL LOW (ref >90)
GFR calc non Af Amer: 7 mL/min — ABNORMAL LOW (ref >90)

## 2012-02-22 MED ORDER — LIDOCAINE HCL (PF) 1 % IJ SOLN: 5.0000 mL | INTRAMUSCULAR | Status: DC | PRN

## 2012-02-22 MED ORDER — PENTAFLUOROPROP-TETRAFLUOROETH EX AERO: 1.0000 "application " | INHALATION_SPRAY | CUTANEOUS | Status: DC | PRN

## 2012-02-22 MED ORDER — ALTEPLASE 2 MG IJ SOLR
2.0000 mg | Freq: Once | INTRAMUSCULAR | Status: DC | PRN
  Filled 2012-02-22: qty 2

## 2012-02-22 MED ORDER — HEPARIN SODIUM (PORCINE) 1000 UNIT/ML DIALYSIS: 1000.0000 [IU] | INTRAMUSCULAR | Status: DC | PRN

## 2012-02-22 MED ORDER — LIDOCAINE-PRILOCAINE 2.5-2.5 % EX CREA: 1.0000 "application " | TOPICAL_CREAM | CUTANEOUS | Status: DC | PRN

## 2012-02-22 MED ORDER — NEPRO/CARBSTEADY PO LIQD
237.0000 mL | ORAL | Status: DC | PRN
  Filled 2012-02-22: qty 237

## 2012-02-22 MED ORDER — SODIUM CHLORIDE 0.9 % IV SOLN: 100.0000 mL | INTRAVENOUS | Status: DC | PRN

## 2012-02-22 NOTE — Progress Notes (Signed)
[facility] Progress Note  Subjective:   Dizziness reportedly still present but likely not dialysis related.  Also HOH- wife at bedside (looks to be 30 years younger than him) plans being made for discharge tomorrow.  HD planned for tonight  Objective Filed Vitals:   02/21/12 2109 02/22/12 0635 02/22/12 0637 02/22/12 0641  BP: 122/62 128/78 119/57 108/57  Pulse: 100 104 62 104  Temp: 98.4 F (36.9 C) 98.1 F (36.7 C)    TempSrc: Oral Oral    Resp:  19    Weight:    66.9 kg (147 lb 7.8 oz)  SpO2: 97% 95%     Physical Exam General: Alert, cooperative, NAD Heart: RRR , no murmur, rub or gallop appreciated Lungs: Clear bilaterally.  No wheezes or rhonchi Abdomen: Soft, non-tender, non-distended. (+) bowel sounds Extremities: Support stockings. Large area of ecchymosis over upper and lower left arm. Dialysis Access: Right I-J.  Left upper AVF maturing, bandaged, bruit audible through dressing  Dialysis Orders:  New start, will be MWF @ [facility] as outpatient. Optiflux 180. 2 K+/ 2.50 Ca++ Bath. Estimated EDW to be determined at d/c. BFR: 350/ DFR: A1.5. Right I-J.    Assessment/Plan:  1. Acute on chronic renal failure, new ESRD - now on HD w ESRD (first HD on 11/9 with temporary catheter); received R IJ catheter with creation of AVF per Dr. Edilia Bo 11/12; outpatient HD will be on MWF @ [facility]. HD MWF as inpt, will have HD Sunday instead of Monday due to inpatient holiday schedule. No heparin for nown (low plts, hematuria).  Plan for next HD tonight due to holiday schedule.  Then he will be discharged and go to OP unit on Friday 2. Dizziness/ AMS - Alert and oriented x 3.  Dizziness improving  3. HTN/volume - BP's trending in the Low 100's systolic. On Lopressor 12.5 bid. Dizziness better. Volume is on low side, weight down 91 > 69 kg since starting HD. Weight dropped this past week to 67 kg despite no UF with HD. Min  UF with HD as tolerated. 4. Anemia -  Hgb down 7.8,  myelodysplastic syndrome is likely contributor. Tsat 37%, Ferritin 337. Continue Aranesp weekly.  Ferrlecit test dose tomorrow then 125 mg IV x 8 doses. 5. Secondary hyperparathyroidism - Ca (corrected) 9.3, Phos 4.0, at goal. Not currently on a binder. Will continue to monitor. 6. Nutrition -  Albumin improving 2.6. Currently on Breeze supplement. Will continue to monitor.  7. Deconditioning - Began rehab on 02/11/12.  8. Thrombocytopenia/Hematuria: per primary. No heparin HD 9. Myelodysplastic syndrome  10. Hx CVA  11. CAD/ Hx Afib  12. Hx BPH - s/p TURP on 10/25 per Dr. Alexia Freestone.    Annie Sable  MD [facility]  02/22/2012, 10:04 AM      Additional Objective Labs: Basic Metabolic Panel:  Lab 02/20/12 1610 02/17/12 1630  NA 136 139  K 4.1 3.6  CL 99 99  CO2 27 27  GLUCOSE 124* 104*  BUN 20 19  CREATININE 7.43* 6.32*  CALCIUM 8.5 8.2*  ALB -- --  PHOS 4.4 4.0   Liver Function Tests:  Lab 02/20/12 0910 02/17/12 1630  AST -- --  ALT -- --  ALKPHOS -- --  BILITOT -- --  PROT -- --  ALBUMIN 2.6* 2.6*   CBC:  Lab 02/20/12 0910 02/17/12 1630  WBC 4.8 7.2  NEUTROABS -- --  HGB 7.8* 7.8*  HCT 25.2* 25.1*  MCV 91.3 90.3  PLT 111* 100*    Medications:      . ciprofloxacin-dexamethasone  4 drop Left Ear BID  . darbepoetin  100 mcg Intravenous Q Thu-HD  . metoprolol tartrate  12.5 mg Oral BID  . multivitamin  1 tablet Oral QHS  . scopolamine  1 patch Transdermal Q72H  . [EXPIRED] white petrolatum

## 2012-02-22 NOTE — Procedures (Signed)
Patient was seen on dialysis and the procedure was supervised.  BFR 400  Via PC BP is  122/62.   Patient appears to be tolerating treatment well  Consandra Laske A 02/22/2012

## 2012-02-22 NOTE — Progress Notes (Signed)
Patient ID: Tracy [name], male   DOB: 1919-08-01, 78 y.o.   MRN: [id number]    Subjective/Complaints: Dizziness present but better. No ear pain. Review of Systems  Genitourinary: Positive for hematuria.   .  All other systems reviewed and are negative.    Objective: Vital Signs: Blood pressure 108/57, pulse 104, temperature 98.1 F (36.7 C), temperature source Oral, resp. rate 19, weight 66.9 kg (147 lb 7.8 oz), SpO2 95.00%. No results found. Results for orders placed during the hospital encounter of 02/11/12 (from the past 72 hour(s))  CBC     Status: Abnormal   Collection Time   02/20/12  9:10 AM      Component Value Range Comment   WBC 4.8  4.0 - 10.5 K/uL    RBC 2.76 (*) 4.22 - 5.81 MIL/uL    Hemoglobin 7.8 (*) 13.0 - 17.0 g/dL    HCT 40.9 (*) 81.1 - 52.0 %    MCV 91.3  78.0 - 100.0 fL    MCH 28.3  26.0 - 34.0 pg    MCHC 31.0  30.0 - 36.0 g/dL    RDW 91.4 (*) 78.2 - 15.5 %    Platelets 111 (*) 150 - 400 K/uL CONSISTENT WITH PREVIOUS RESULT  RENAL FUNCTION PANEL     Status: Abnormal   Collection Time   02/20/12  9:10 AM      Component Value Range Comment   Sodium 136  135 - 145 mEq/L    Potassium 4.1  3.5 - 5.1 mEq/L    Chloride 99  96 - 112 mEq/L    CO2 27  19 - 32 mEq/L    Glucose, Bld 124 (*) 70 - 99 mg/dL    BUN 20  6 - 23 mg/dL    Creatinine, Ser 9.56 (*) 0.50 - 1.35 mg/dL    Calcium 8.5  8.4 - 21.3 mg/dL    Phosphorus 4.4  2.3 - 4.6 mg/dL    Albumin 2.6 (*) 3.5 - 5.2 g/dL    GFR calc non Af Amer 6 (*) >90 mL/min    GFR calc Af Amer 7 (*) >90 mL/min      HEENT: normal, ear drum intact Cardio: irregular Resp: CTA B/L GI: BS positive Extremity:  No Edema.   Skin:   Wound redness L antecubital fossa, with bruising but swelling and tenderness much improved. Neuro: Alert/Oriented. Moves all 4's. Cognitively intact. nystagmus1-2 beatwith right lateral gaze, no nystagmus with left gaze.  Musc/Skel: right knee tender over patella. Minimal swelling--some  improvement Gen NAD Urine red but non-cloudy   Assessment/Plan: 1. Functional deficits secondary to deconditioning acute on chronic renal failure, anemia due to hematuria which require 3+ hours per day of interdisciplinary therapy in a comprehensive inpatient rehab setting. Physiatrist is providing close team supervision and 24 hour management of active medical problems listed below. Physiatrist and rehab team continue to assess barriers to discharge/monitor patient progress toward functional and medical goals. FIM: FIM - Bathing Bathing Steps Patient Completed: Chest;Right Arm;Left Arm;Front perineal area;Abdomen;Buttocks;Right upper leg;Left upper leg;Left lower leg (including foot);Right lower leg (including foot) Bathing: 5: Supervision: Safety issues/verbal cues  FIM - Upper Body Dressing/Undressing Upper body dressing/undressing steps patient completed: Thread/unthread left sleeve of pullover shirt/dress;Pull shirt over trunk;Thread/unthread right sleeve of pullover shirt/dresss;Put head through opening of pull over shirt/dress Upper body dressing/undressing: 5: Set-up assist to: Obtain clothing/put away FIM - Lower Body Dressing/Undressing Lower body dressing/undressing steps patient completed: Thread/unthread right pants leg;Thread/unthread left pants leg;Pull  pants up/down;Don/Doff left sock;Don/Doff right sock Lower body dressing/undressing: 5: Supervision: Safety issues/verbal cues  FIM - Toileting Toileting steps completed by patient: Adjust clothing prior to toileting;Performs perineal hygiene;Adjust clothing after toileting Toileting: 5: Supervision: Safety issues/verbal cues  FIM - Archivist Transfers Assistive Devices: Elevated toilet seat;Walker Toilet Transfers: 4-From toilet/BSC: Min A (steadying Pt. > 75%);4-To toilet/BSC: Min A (steadying Pt. > 75%)  FIM - Bed/Chair Transfer Bed/Chair Transfer Assistive Devices: Therapist, occupational: 5: Bed >  Chair or W/C: Supervision (verbal cues/safety issues);5: Chair or W/C > Bed: Supervision (verbal cues/safety issues);5: Sit > Supine: Supervision (verbal cues/safety issues);5: Supine > Sit: Supervision (verbal cues/safety issues)  FIM - Locomotion: Wheelchair Distance: 40 Locomotion: Wheelchair: 0: Activity did not occur FIM - Locomotion: Ambulation Locomotion: Ambulation Assistive Devices: Other (comment) (4 wheeled walker) Ambulation/Gait Assistance: 4: Min assist Locomotion: Ambulation: 2: Travels 50 - 149 ft with minimal assistance (Pt.>75%)  Comprehension Comprehension Mode: Auditory Comprehension: 5-Understands complex 90% of the time/Cues < 10% of the time  Expression Expression Mode: Verbal Expression: 5-Expresses complex 90% of the time/cues < 10% of the time  Social Interaction Social Interaction: 6-Interacts appropriately with others with medication or extra time (anti-anxiety, antidepressant).  Problem Solving Problem Solving: 5-Solves complex 90% of the time/cues < 10% of the time  Memory Memory: 5-Recognizes or recalls 90% of the time/requires cueing < 10% of the time  Medical Problem List and Plan:  1. DVT Prophylaxis/Anticoagulation: Mechanical: Sequential compression devices, below knee Bilateral lower extremities  2. Pain Management: prn hydrocodone effective  -added ice for left knee 3. Mood:  Offer ego support.  4. Neuropsych: This patient is capable of making decisions on his/her own behalf.  5. Acute on chronic renal failure: Now HD dependent. Continue HD T, T, S. Daily weights. Renal diet.   - RD assistance with food choices appreciated 6. Atrial fibrillation: Monitor HR with bid checks. Continue metoprolol with hold parameters for orthostasis  -continue TEDS, precautions for orthostasis  -Off coumadin due to recent bleeding events.  7. Mixed CHF with pleural effusion: Appears to be compensated on dialysis and FR. Continue daily weights.  8. Acute on  chronic anemia: On aranesp weekly. Continue to monitor H/H with HD. Has been relatively stable despite ongoing urethral bleeding.  9. MDS: likely contributing to thrombocytopenia. Platelets on slow upward trend 58---> 82.  10. BPH s/p TURP: Foley to continue due to continue hematuria.  Hematuria present. Blood counts are increasing  -will need to go home with cath 11. Dizziness and nausea- appears to be BPPV related to chronic ear surgery,  ?fluid collection in ear  -scopolamine patch has helped, does have some dry mouth  -vestibular rx per PT  -cipro gtts for ear. I examined ear and drum appears fairly intact. Denies ear pain.  LOS (Days) 11 A FACE TO FACE EVALUATION WAS PERFORMED  Nic Lampe T 02/22/2012, 7:25 AM

## 2012-02-22 NOTE — Progress Notes (Signed)
Physical Therapy Discharge Summary  Patient Details  Name: Tracy [name] MRN: [id number] Date of Birth: Aug 30, 1919  Today's Date: 02/22/2012  Patient has met 2 of 7 long term goals due to improved activity tolerance.  Patient to discharge at an ambulatory level Min Assist.   Patient's care partner went through family education but was unable to demonstrate safety with providing min A level care for pt.  Social Investment banker, operational aware.  Pt/wife express understanding of need for min A at all times when pt is up at home although unable to demonstrate.  Pt/wife still state they want to proceed with d/c.  Reasons goals not met: pt with new onset of dizziness, treated with vestibular treatment but still not resolved.  Dizziness impaired pt's balance which has made pt regress to min A level with all functional transfers and gait.  Recommendation:  Patient will benefit from ongoing skilled PT services in home health setting to continue to advance safe functional mobility, address ongoing impairments in balance, gait, vestibular issues, strength, activity tolerance, and minimize fall risk.  Equipment: w/c, RW  Reasons for discharge: treatment goals met and discharge from hospital  Patient/family agrees with progress made and goals achieved: Yes  PT Discharge  Cognition Overall Cognitive Status: Appears within functional limits for tasks assessed Arousal/Alertness: Awake/alert Orientation Level: Oriented X4 Memory: Impaired Memory Impairment: Storage deficit;Decreased short term memory;Decreased recall of new information Safety/Judgment: Impaired Sensation Sensation Light Touch: Appears Intact Stereognosis: Appears Intact Hot/Cold: Appears Intact Proprioception: Appears Intact Coordination Gross Motor Movements are Fluid and Coordinated: Yes Fine Motor Movements are Fluid and Coordinated: Yes Motor  Motor Motor: Within Functional Limits   Trunk/Postural Assessment  Cervical  Assessment Cervical Assessment: Within Functional Limits Thoracic Assessment Thoracic Assessment: Within Functional Limits Lumbar Assessment Lumbar Assessment: Within Functional Limits Postural Control Postural Control: Within Functional Limits  Balance Static Standing Balance Static Standing - Balance Support: During functional activity Static Standing - Level of Assistance: 4: Min assist Dynamic Standing Balance Dynamic Standing - Balance Support: During functional activity Dynamic Standing - Level of Assistance: 4: Min assist Extremity Assessment  RUE Assessment RUE Assessment: Within Functional Limits LUE Assessment LUE Assessment: Within Functional Limits LUE Strength LUE Overall Strength Comments: improved mobility RLE Assessment RLE Assessment: Within Functional Limits LLE Assessment LLE Assessment: Within Functional Limits  See FIM for current functional status  Tracy [name] 02/22/2012, 3:45 PM

## 2012-02-22 NOTE — Progress Notes (Signed)
Occupational Therapy Discharge Summary  Patient Details  Name: Tracy [name] MRN: [id number] Date of Birth: [birth year]  Today's Date: 02/22/2012 Time: 1000-1100 Time Calculation (min): 60 min  Grad Day: self care retraining at shower level down in ADL apartment with wife for family education. Review safe functional ambulation with RW short distance with hands on min A, tub bench transfer, sit to stand bathing and dressing, activity tolerance, how dizziness is impacting balance and functional mobility, care for external HD port and protecting it, home safety tips, w/c setup and break down. Pt's wife not remaining close enough to pt despite numerous demonstrations and verbal instructions. Pt with multiple LOB and pt demonstrated lack of awareness of LOB and the need for assistance. Therapist had to intervene each time to prevent full LOB and fall. Discussed at length about remaining on first floor and using half bath down stairs and using w/c for community mobility or mobility in house when very dizzy.  Patient has met 7 of 7 long term goals due to improved activity tolerance, postural control, functional use of  LEFT upper extremity and improved coordination.  Patient to discharge at overall Supervision level.  Patient's care partner requires assistance to provide the necessary physical assistance at discharge, but has heard all recommendations for using w/c around house and only going short distance due to the fact pt requires min to mod A with functional ambulation.  Pt still with significant balance deficits dynamically ie ambulation with RW. Pt can perform tasks sit to stand with supervision with extra time    Reasons goals not met: n/a  Recommendation:  Patient will benefit from ongoing skilled OT services in home health setting to continue to advance functional skills in the area of BADL, Reduce care partner burden and home safety eval.  Equipment: tub bench  Reasons for discharge: treatment  goals met and discharge from hospital  Patient/family agrees with progress made and goals achieved: Yes  OT Discharge Precautions/Restrictions  Precautions Precautions: Fall Restrictions Weight Bearing Restrictions: No    Vital Signs Therapy Vitals Temp: 98.2 F (36.8 C) Temp src: Oral Pulse Rate: 101  Resp: 15  BP: 129/68 mmHg Patient Position, if appropriate: Lying Oxygen Therapy SpO2: 98 % O2 Device: None (Room air) Pain Pain Assessment Pain Assessment: No/denies pain ADL  see fIM Vision/Perception  Vision - History Baseline Vision: Wears glasses all the time Patient Visual Report: No change from baseline Vision - Assessment Eye Alignment: Within Functional Limits Perception Perception: Within Functional Limits Praxis Praxis: Intact  Cognition Overall Cognitive Status: Appears within functional limits for tasks assessed Arousal/Alertness: Awake/alert Orientation Level: Oriented X4 Memory: Impaired Memory Impairment: Storage deficit;Decreased short term memory;Decreased recall of new information Safety/Judgment: Impaired Sensation Sensation Light Touch: Appears Intact Stereognosis: Appears Intact Hot/Cold: Appears Intact Proprioception: Appears Intact Coordination Gross Motor Movements are Fluid and Coordinated: Yes Fine Motor Movements are Fluid and Coordinated: Yes Motor  Motor Motor: Within Functional Limits Mobility  Bed Mobility Supine to Sit: 6: Modified independent (Device/Increase time) Sitting - Scoot to Edge of Bed: 5: Supervision Sit to Supine: 4: Min guard Transfers Sit to Stand: 5: Supervision Stand to Sit: 5: Supervision  Trunk/Postural Assessment  Cervical Assessment Cervical Assessment: Within Functional Limits Thoracic Assessment Thoracic Assessment: Within Functional Limits Lumbar Assessment Lumbar Assessment: Within Functional Limits Postural Control Postural Control: Within Functional Limits  Balance   Extremity/Trunk  Assessment RUE Assessment RUE Assessment: Within Functional Limits LUE Assessment LUE Assessment: Within Functional Limits LUE Strength LUE Overall  Strength Comments: improved mobility  See FIM for current functional status  Roney Mans [facility] 02/22/2012, 3:31 PM

## 2012-02-22 NOTE — Progress Notes (Signed)
Physical Therapy Note  Patient Details  Name: VIAN FLUEGEL MRN: [id number] Date of Birth: Aug 10, 1919 Today's Date: 02/22/2012  Time: 1100-1155 55 minutes  No c/o pain or dizziness.  Treatment focused on pt/family education with pt and his wife. Pt/wife educated on and demo'd car transfers, stairs with and without handrail, bed mobility, controlled and household gait, w/c mobility and set up/parts management.  Pt's wife hesitant to put hands on pt to assist although PT demo'd and verbally instructed wife that pt requires hands on min A.  Pt with episodes of LOB requiring PT to correct since pt's wife not hands on.  Educated throughout session on fall risk and needs for hands on at all times when pt is up and moving, wife verbalizes but unable to demo safely.  Pt resistant to hands on assist throughout, stating "I can do this on my own".  Pt and wife educated on need to work together to promote safety at home.  Social Investment banker, operational aware that pt's wife was unable to demo safety with transfers, gait and stair training.  Individual therapy  Eyad Rochford 02/22/2012, 12:25 PM

## 2012-02-23 DIAGNOSIS — R5381 Other malaise: Secondary | ICD-10-CM

## 2012-02-23 DIAGNOSIS — R339 Retention of urine, unspecified: Secondary | ICD-10-CM

## 2012-02-23 DIAGNOSIS — N19 Unspecified kidney failure: Secondary | ICD-10-CM

## 2012-02-23 MED ORDER — METOPROLOL TARTRATE 25 MG PO TABS: 12.5000 mg | ORAL_TABLET | Freq: Two times a day (BID) | ORAL | Status: DC

## 2012-02-23 MED ORDER — CIPROFLOXACIN-DEXAMETHASONE 0.3-0.1 % OT SUSP: 4.0000 [drp] | Freq: Two times a day (BID) | OTIC | Status: DC

## 2012-02-23 MED ORDER — SCOPOLAMINE 1 MG/3DAYS TD PT72: 1.0000 | MEDICATED_PATCH | TRANSDERMAL | Status: DC

## 2012-02-23 MED ORDER — RENA-VITE PO TABS: 1.0000 | ORAL_TABLET | Freq: Every day | ORAL | Status: DC

## 2012-02-23 NOTE — Progress Notes (Signed)
Patient ID: Tracy [name], male   DOB: May 08, 1919, 78 y.o.   MRN: [id number]    Subjective/Complaints: Dizziness present but better. No ear pain. Review of Systems  Genitourinary: Positive for hematuria.   .  All other systems reviewed and are negative.    Objective: Vital Signs: Blood pressure 115/63, pulse 103, temperature 98.1 F (36.7 C), temperature source Oral, resp. rate 17, weight 65.3 kg (143 lb 15.4 oz), SpO2 95.00%. No results found. Results for orders placed during the hospital encounter of 02/11/12 (from the past 72 hour(s))  CBC     Status: Abnormal   Collection Time   02/20/12  9:10 AM      Component Value Range Comment   WBC 4.8  4.0 - 10.5 K/uL    RBC 2.76 (*) 4.22 - 5.81 MIL/uL    Hemoglobin 7.8 (*) 13.0 - 17.0 g/dL    HCT 62.9 (*) 52.8 - 52.0 %    MCV 91.3  78.0 - 100.0 fL    MCH 28.3  26.0 - 34.0 pg    MCHC 31.0  30.0 - 36.0 g/dL    RDW 41.3 (*) 24.4 - 15.5 %    Platelets 111 (*) 150 - 400 K/uL CONSISTENT WITH PREVIOUS RESULT  RENAL FUNCTION PANEL     Status: Abnormal   Collection Time   02/20/12  9:10 AM      Component Value Range Comment   Sodium 136  135 - 145 mEq/L    Potassium 4.1  3.5 - 5.1 mEq/L    Chloride 99  96 - 112 mEq/L    CO2 27  19 - 32 mEq/L    Glucose, Bld 124 (*) 70 - 99 mg/dL    BUN 20  6 - 23 mg/dL    Creatinine, Ser 0.10 (*) 0.50 - 1.35 mg/dL    Calcium 8.5  8.4 - 27.2 mg/dL    Phosphorus 4.4  2.3 - 4.6 mg/dL    Albumin 2.6 (*) 3.5 - 5.2 g/dL    GFR calc non Af Amer 6 (*) >90 mL/min    GFR calc Af Amer 7 (*) >90 mL/min   CBC     Status: Abnormal   Collection Time   02/22/12  1:20 PM      Component Value Range Comment   WBC 5.6  4.0 - 10.5 K/uL    RBC 2.63 (*) 4.22 - 5.81 MIL/uL    Hemoglobin 7.7 (*) 13.0 - 17.0 g/dL    HCT 53.6 (*) 64.4 - 52.0 %    MCV 92.0  78.0 - 100.0 fL    MCH 29.3  26.0 - 34.0 pg    MCHC 31.8  30.0 - 36.0 g/dL    RDW 03.4 (*) 74.2 - 15.5 %    Platelets 137 (*) 150 - 400 K/uL   RENAL FUNCTION PANEL      Status: Abnormal   Collection Time   02/22/12  1:20 PM      Component Value Range Comment   Sodium 136  135 - 145 mEq/L    Potassium 3.6  3.5 - 5.1 mEq/L    Chloride 98  96 - 112 mEq/L    CO2 27  19 - 32 mEq/L    Glucose, Bld 108 (*) 70 - 99 mg/dL    BUN 17  6 - 23 mg/dL    Creatinine, Ser 5.95 (*) 0.50 - 1.35 mg/dL    Calcium 8.8  8.4 - 63.8 mg/dL  Phosphorus 3.7  2.3 - 4.6 mg/dL    Albumin 2.8 (*) 3.5 - 5.2 g/dL    GFR calc non Af Amer 7 (*) >90 mL/min    GFR calc Af Amer 8 (*) >90 mL/min      HEENT: normal, ear drum intact Cardio: irregular Resp: CTA B/L GI: BS positive Extremity:  No Edema.   Skin:   Wound redness L antecubital fossa, with bruising but swelling and tenderness much improved. Neuro: Alert/Oriented. Moves all 4's. Cognitively intact. nystagmus1-2 beatwith right lateral gaze, no nystagmus with left gaze.  Musc/Skel: right knee tender over patella. Minimal swelling--some improvement Gen NAD Urine red but non-cloudy   Assessment/Plan: 1. Functional deficits secondary to deconditioning acute on chronic renal failure, anemia due to hematuria which require 3+ hours per day of interdisciplinary therapy in a comprehensive inpatient rehab setting. Physiatrist is providing close team supervision and 24 hour management of active medical problems listed below. Physiatrist and rehab team continue to assess barriers to discharge/monitor patient progress toward functional and medical goals.  Dc home today. Reviewed safety issues with the patient. He seems to understand. Hhrn,therapies. FIM: FIM - Bathing Bathing Steps Patient Completed: Chest;Right Arm;Left Arm;Front perineal area;Abdomen;Buttocks;Right upper leg;Left upper leg;Left lower leg (including foot);Right lower leg (including foot) Bathing: 5: Supervision: Safety issues/verbal cues  FIM - Upper Body Dressing/Undressing Upper body dressing/undressing steps patient completed: Thread/unthread left sleeve of  pullover shirt/dress;Pull shirt over trunk;Thread/unthread right sleeve of pullover shirt/dresss;Put head through opening of pull over shirt/dress Upper body dressing/undressing: 5: Set-up assist to: Obtain clothing/put away FIM - Lower Body Dressing/Undressing Lower body dressing/undressing steps patient completed: Thread/unthread right pants leg;Thread/unthread left pants leg;Pull pants up/down;Don/Doff left sock;Don/Doff right sock Lower body dressing/undressing: 5: Set-up assist to: Don/Doff TED stocking  FIM - Toileting Toileting steps completed by patient: Adjust clothing prior to toileting;Performs perineal hygiene;Adjust clothing after toileting Toileting: 5: Supervision: Safety issues/verbal cues  FIM - Archivist Transfers Assistive Devices: Elevated toilet seat;Walker Toilet Transfers: 5-To toilet/BSC: Supervision (verbal cues/safety issues);5-From toilet/BSC: Supervision (verbal cues/safety issues)  FIM - Banker Devices: Therapist, occupational: 5: Supine > Sit: Supervision (verbal cues/safety issues);4: Bed > Chair or W/C: Min A (steadying Pt. > 75%)  FIM - Locomotion: Wheelchair Distance: 40 Locomotion: Wheelchair: 2: Travels 50 - 149 ft with supervision, cueing or coaxing FIM - Locomotion: Ambulation Locomotion: Ambulation Assistive Devices: Other (comment) (4 wheeled walker) Ambulation/Gait Assistance: 4: Min assist Locomotion: Ambulation: 2: Travels 50 - 149 ft with minimal assistance (Pt.>75%)  Comprehension Comprehension Mode: Auditory Comprehension: 5-Understands complex 90% of the time/Cues < 10% of the time  Expression Expression Mode: Verbal Expression: 5-Expresses complex 90% of the time/cues < 10% of the time  Social Interaction Social Interaction: 6-Interacts appropriately with others with medication or extra time (anti-anxiety, antidepressant).  Problem Solving Problem Solving: 5-Solves complex  90% of the time/cues < 10% of the time  Memory Memory: 5-Recognizes or recalls 90% of the time/requires cueing < 10% of the time  Medical Problem List and Plan:  1. DVT Prophylaxis/Anticoagulation: Mechanical: Sequential compression devices, below knee Bilateral lower extremities  2. Pain Management: prn hydrocodone effective  -added ice for left knee 3. Mood:  Offer ego support.  4. Neuropsych: This patient is capable of making decisions on his/her own behalf.  5. Acute on chronic renal failure: Now HD dependent. Continue HD T, T, S. Daily weights. Renal diet.   - RD assistance with food choices appreciated 6. Atrial fibrillation:  Monitor HR with bid checks. Continue metoprolol with hold parameters for orthostasis  -continue TEDS, precautions for orthostasis  -Off coumadin due to recent bleeding events.  7. Mixed CHF with pleural effusion: Appears to be compensated on dialysis and FR. Continue daily weights.  8. Acute on chronic anemia: On aranesp weekly. Continue to monitor H/H with HD. Has been relatively stable despite ongoing urethral bleeding.  9. MDS: likely contributing to thrombocytopenia. Platelets on slow upward trend 58---> 82.  10. BPH s/p TURP: Foley to continue due to continue hematuria.  Hematuria present. Blood counts are increasing  -will need to go home with cath 11. Dizziness and nausea- appears to be BPPV related to chronic ear surgery,  ?fluid collection in ear  -scopolamine patch has helped, does have some dry mouth  -vestibular rx per PT  -cipro gtts for ear. I examined ear and drum appears fairly intact. Denies ear pain.  LOS (Days) 12 A FACE TO FACE EVALUATION WAS PERFORMED  Cayson Kalb T 02/23/2012, 7:22 AM

## 2012-02-23 NOTE — Progress Notes (Signed)
Social Work  Discharge Note  The overall goal for the admission was met for:   Discharge location: Yes - home with wife  Length of Stay: Yes - 12 days  Discharge activity level: Yes - minimal assistance  Home/community participation: Yes  Services provided included: MD, RD, PT, OT, SLP, RN, TR, Pharmacy and SW  Financial Services: Medicare and Private Insurance: [employer]  Follow-up services arranged: Home Health: RN, PT, OT via [facility], DME: [employer] w/c, cushion, walker and tub bench via [facility] and Patient/Family has no preference for HH/DME agencies  Comments (or additional information):  Patient/Family verbalized understanding of follow-up arrangements: Yes  Individual responsible for coordination of the follow-up plan: wife  Confirmed correct DME delivered: Amada Jupiter 02/23/2012    Vada Yellen

## 2012-02-23 NOTE — Progress Notes (Signed)
Discharge to home accommpanied by family member. Discharge instructions given to pt and dtr by PA. No questions noted upon discharge . Tracy [name]

## 2012-02-23 NOTE — Progress Notes (Signed)
Discharge summary # [phone]

## 2012-02-23 NOTE — Plan of Care (Signed)
Problem: RH BLADDER ELIMINATION Goal: RH STG MANAGE BLADDER WITH EQUIPMENT WITH ASSISTANCE STG Manage Bladder With Equipment With Assistance  Outcome: Not Met (add Reason) Pt sent home with foley and family to manage

## 2012-02-23 NOTE — Patient Care Conference (Signed)
Inpatient RehabilitationTeam Conference Note Date: 02/22/2012   Time: 3:05 PM    Patient Name: Tracy [name]      Medical Record Number: [id number]  Date of Birth: Oct 17, 1919 Sex: Male         Room/Bed: 4006/4006-01 Payor Info: Payor: MEDICARE  Plan: MEDICARE PART A AND B  Product Type: *No Product type*     Admitting Diagnosis: Deconditioned  Admit Date/Time:  02/11/2012  2:02 PM Admission Comments: No comment available   Primary Diagnosis:  Physical deconditioning Principal Problem: Physical deconditioning  Patient Active Problem List   Diagnosis Date Noted  . Physical deconditioning 02/11/2012  . Acute-on-chronic renal failure 02/04/2012  . Elevated brain natriuretic peptide (BNP) level 01/24/2012  . Chronic combined systolic and diastolic CHF (congestive heart failure) 01/24/2012  . Hyperkalemia 12/31/2011  . Anemia associated with acute blood loss 12/24/2011  . Warfarin-induced coagulopathy 12/24/2011  . MDS (myelodysplastic syndrome) 12/16/2011  . Hypercalcemia 12/04/2011  . CKD (chronic kidney disease), stage IV 12/04/2011  . Cachexia 12/04/2011  . Epistaxis 11/26/2011  . Pulmonary fibrosis 10/12/2011  . Pulmonary nodule seen on imaging study 10/12/2011  . Pancytopenia 06/30/2011  . Chronic diastolic heart failure 03/04/2011  . Encounter for long-term (current) use of anticoagulants 02/25/2011  . NSTEMI (non-ST elevated myocardial infarction) 02/16/2011  . Atrial fibrillation 02/16/2011  . DYSPNEA ON EXERTION 06/11/2010  . CARDIOMYOPATHY 08/05/2009  . PREMATURE VENTRICULAR CONTRACTIONS 08/05/2009  . SYNCOPE 08/05/2009  . BRADYCARDIA 06/06/2009  . HYPERLIPIDEMIA 07/02/2008  . Essential hypertension, benign 07/02/2008  . CAD 07/02/2008  . CAROTID STENOSIS 07/02/2008  . RENAL INSUFFICIENCY 07/02/2008    Expected Discharge Date: Expected Discharge Date: 02/22/12  Team Members Present:       Current Status/Progress Goal Weekly Team Focus  Medical   continue  foley cath until hematuria, vertigo better, ear appears heled  see prior  see prior   Bowel/Bladder             Swallow/Nutrition/ Hydration             ADL's   sit to stand ADLs supervision, funcitonal ambulation with min A with RW  supervision  family education with wife, safety with AD   Mobility   min A - supervision  supervision  family ed   Communication             Safety/Cognition/ Behavioral Observations            Pain             Skin                *See Interdisciplinary Assessment and Plan and progress notes for long and short-term goals  Barriers to Discharge: safety, balance    Possible Resolutions to Barriers:  family education, need for close supervision    Discharge Planning/Teaching Needs:  home with wife to provide 24/7 assistance      Team Discussion:  Ready for d/c tomorrow.  Family education being completed today  Revisions to Treatment Plan:  none   Continued Need for Acute Rehabilitation Level of Care: The patient requires daily medical management by a physician with specialized training in physical medicine and rehabilitation for the following conditions: Daily direction of a multidisciplinary physical rehabilitation program to ensure safe treatment while eliciting the highest outcome that is of practical value to the patient.: Yes Daily medical management of patient stability for increased activity during participation in an intensive rehabilitation regime.: Yes Daily analysis of laboratory values  and/or radiology reports with any subsequent need for medication adjustment of medical intervention for : Post surgical problems;Neurological problems  Tracy [name] 02/23/2012, 12:42 PM

## 2012-02-24 ENCOUNTER — Encounter (HOSPITAL_COMMUNITY): Payer: Medicare Other

## 2012-02-24 NOTE — Discharge Summary (Signed)
NAME:  Tracy [name] NO.:  [id number]  MEDICAL RECORD NO.:  [id number]  LOCATION:  4006                         FACILITY:  [facility]  PHYSICIAN:  Ranelle Oyster, M.D.DATE OF BIRTH:  1919/06/22  DATE OF ADMISSION:  02/11/2012 DATE OF DISCHARGE:  02/23/2012                              DISCHARGE SUMMARY   DISCHARGE DIAGNOSES: 1. Deconditioning due to multiple medical issues. 2. Acute on chronic renal failure, hemodialysis dependent. 3. Transurethral resection of prostate with ongoing hematuria. 4. Myelodysplastic syndrome. 5. Acute on chronic anemia. 6. Atrial fibrillation.  HISTORY OF PRESENT ILLNESS:  Tracy [name] is a 77 year old male with history of chronic kidney disease stage IV, AFib, myelodysplastic syndrome who was originally admitted to [facility] October 25 for TURP by Dr. Vernie Ammons postop, had gross hematuria requiring Amicar as well as bladder irrigation and cystoscopy for clot evacuation.  Hospital course was complicated by acute blood loss anemia, requiring multiple units of packed red blood cells and platelets.  He has had acute on chronic fluid overload requiring initiation of hemodialysis, left AV fistula and right IJ cath placed by Dr. Edilia Bo on February 08, 2012. The patient continues to have hematuria and Foley is to remain in place until urine clears prior to initiating voiding trial per Urology. Therapies were initiated and patient is noted to be deconditioned.  CIR was recommended for progression.  PAST MEDICAL HISTORY:  Coronary artery disease, chronic kidney disease, TIA, hypertension, hyperlipidemia, AFib, chronic diastolic heart failure, carotid stenosis, MDS, CVA, chronic shortness of breath with activity.  FUNCTIONAL HISTORY:  The patient was requiring min to mod assist for transfers, +2 total assist 70% for ambulating 20 feet with multiple rest breaks.  He required min assist for upper body care, mod assist  for lower body care.  RECENT LABORATORY FINDINGS:  Check of lytes from November 26 revealed sodium 136, potassium 3.6, chloride 98, CO2 of 27, BUN 17, creatinine 6.58, glucose 108.  Phosphorus level is 3.7, albumin at 2.8, calcium at 8.8.  CBC revealed hemoglobin 7.7, hematocrit 24.2, white count 5.6, platelets 137.  HOSPITAL COURSE:  Mr. [name] was admitted to Rehab on February 11, 2012, for inpatient therapies to consist of PT, OT at least 3 hours 5 days a week.  Past admission physiatrist, rehab RN, and therapy team have worked together to provide customized collaborative interdisciplinary care.  Rehab RN has worked with the patient on bowel and bladder program.  The patient urine output was monitored and as he has continued to have hematuria, Foley remains in place.  His H and H has been checked during hemodialysis.  This is showing a slight drop to 7.7. His thrombocytopenia is slowly resolving with platelets improved from 91 to 137 at the time of discharge.  The patient and wife were educated regarding renal diet.  Blood pressures have been checked on b.i.d. basis.  These have ranged from 108-120 systolic.  The patient did report some problems with difficulty hearing on the left ear as well as dizziness.  He was started on Ciprodex ear drops as well as scopolamine patch with improvement in his symptoms.  His left AV graft  site is healing well without any signs or symptoms of infection.  During the patient's stay in rehab, team conferences were held to monitor the patient's progress, set goals, as well as discuss barriers to discharge.  Physical Therapy has worked with the patient on safety and mobility.  The patient is showing some improvement in activity tolerance.  He is at min assist level for transfers and mobility.  His dizziness impairs his balance and therefore, patient continues to require min assist with all functional transfers and gait.  OT has worked with patient  on self-care tasks.  The patient is at supervision level for bathing and dressing.  It is recommended that patient use wheelchair around the home and only walk short distances due to his problems with dizziness and need for min to mod assist with functional ambulation.  Family education was done with the patient's wife.  Wife and daughter to provide supervision past discharge.  On February 23, 2012, the patient is discharged to home.  DISCHARGE MEDICATIONS: 1. Ciprodex eardrops 4 drops left ear b.i.d. for 5 additional days. 2. Lopressor 12.5 mg b.i.d. 3. Scopolamine patch change every 3 days. 4. Nephro-Vite 1 p.o. per day. 5. Tylenol 500 mg p.o. q.6 h. p.r.n. pain. 6. Aranesp every 7 days with hemodialysis.  DIET:  Renal diet with 1200 mL fluid restriction.  ACTIVITY LEVEL:  24 hours assistance. Min assist with ambulation. Use walker.  SPECIAL INSTRUCTIONS:  Do not use Imdur, Macrodantin or Ultram.  Call Dr. Vernie Ammons if hematuria increases or for any problems with Foley. [facility] to provide PT, OT, and RN.  FOLLOWUP:  The patient to follow up with Dr. Edilia Bo for postop check on December 18.  Follow up with Dr. Ihor Gully in the next 1-2 weeks. Follow up with Dr. Riley Kill as needed.  Follow up with Dr. Marjory Lies for posthospital check.     Delle Reining, P.A.   ______________________________ Ranelle Oyster, M.D.    PL/MEDQ  D:  02/23/2012  T:  02/24/2012  Job:  [id number]  cc:   Veverly Fells. Vernie Ammons, M.D. Di Kindle. Edilia Bo, M.D. Marjory Lies, M.D.

## 2012-02-29 ENCOUNTER — Encounter (HOSPITAL_COMMUNITY): Payer: Medicare Other

## 2012-03-02 ENCOUNTER — Encounter (HOSPITAL_COMMUNITY): Payer: Medicare Other

## 2012-03-07 ENCOUNTER — Encounter (HOSPITAL_COMMUNITY): Payer: Medicare Other

## 2012-03-09 ENCOUNTER — Encounter (HOSPITAL_COMMUNITY): Payer: Medicare Other

## 2012-03-14 ENCOUNTER — Encounter (HOSPITAL_COMMUNITY): Payer: Medicare Other

## 2012-03-14 ENCOUNTER — Encounter: Payer: Self-pay | Admitting: Vascular Surgery

## 2012-03-15 ENCOUNTER — Ambulatory Visit (INDEPENDENT_AMBULATORY_CARE_PROVIDER_SITE_OTHER): Payer: Medicare Other | Admitting: Vascular Surgery

## 2012-03-15 ENCOUNTER — Encounter (INDEPENDENT_AMBULATORY_CARE_PROVIDER_SITE_OTHER): Payer: Medicare Other | Admitting: *Deleted

## 2012-03-15 ENCOUNTER — Encounter: Payer: Self-pay | Admitting: Vascular Surgery

## 2012-03-15 VITALS — BP 114/61 | HR 72 | Resp 18 | Ht 73.0 in | Wt 135.0 lb

## 2012-03-15 DIAGNOSIS — N186 End stage renal disease: Secondary | ICD-10-CM

## 2012-03-15 DIAGNOSIS — Z4931 Encounter for adequacy testing for hemodialysis: Secondary | ICD-10-CM

## 2012-03-15 NOTE — Progress Notes (Signed)
[facility]  Patient name: Tracy [name] MRN: [id number] DOB: 01-08-1920 Sex: male  REASON FOR VISIT: follow up after left brachiocephalic AV fistula  HPI: Tracy [name] is a 78 y.o. male who had a tunneled dialysis catheter and a left brachiocephalic AV fistula placed on 40/98/1191. He comes in for a 6 week follow up visit. He has complained of some intermittent paresthesias in the left hand however the symptoms are tolerable and appeared to be gradually improving. He dialyzes with his catheter.   REVIEW OF SYSTEMS: Arly.Keller ] denotes positive finding; [  ] denotes negative finding  CARDIOVASCULAR:  [ ]  chest pain   [ ]  dyspnea on exertion    CONSTITUTIONAL:  [ ]  fever   [ ]  chills  PHYSICAL EXAM: Filed Vitals:   03/15/12 1544  BP: 114/61  Pulse: 72  Resp: 18  Height: 6\' 1"  (1.854 m)  Weight: 135 lb (61.236 kg)   Body mass index is 17.81 kg/(m^2). GENERAL: The patient is a well-nourished male, in no acute distress. The vital signs are documented above. CARDIOVASCULAR: There is a regular rate and rhythm  PULMONARY: There is good air exchange bilaterally without wheezing or rales. His left upper arm fistula has an excellent bruit and thrill  I have independently interpreted his duplex scan of his fistula which shows a diameters range from 0.71 cm to 1.07 cm. The vein is very superficial in the depth is not an issue. It was some mild leak increased velocities in the mid brachial level of the outflow vein this did not appear to be significant.  MEDICAL ISSUES: I think it would be reasonable to cannulate the graft in 6 weeks. It appears to be maturing adequately. Hopefully the mild intermittent paresthesias in the left hand will gradually improved. If he has any problems with the fistula once they begin using it we could always consider a fistulogram. I will see him back as needed.  Tracy [name] S [facility]:  [phone]

## 2012-03-16 ENCOUNTER — Encounter (HOSPITAL_COMMUNITY): Payer: Medicare Other

## 2012-03-21 ENCOUNTER — Encounter (HOSPITAL_COMMUNITY): Payer: Medicare Other

## 2012-03-23 ENCOUNTER — Encounter (HOSPITAL_COMMUNITY): Payer: Medicare Other

## 2012-03-28 ENCOUNTER — Encounter (HOSPITAL_COMMUNITY): Payer: Medicare Other

## 2012-03-30 ENCOUNTER — Other Ambulatory Visit: Payer: Medicare Other

## 2012-03-30 ENCOUNTER — Encounter (HOSPITAL_COMMUNITY): Payer: Medicare Other

## 2012-04-04 ENCOUNTER — Encounter (HOSPITAL_COMMUNITY): Payer: Medicare Other

## 2012-04-06 ENCOUNTER — Encounter (HOSPITAL_COMMUNITY): Payer: Medicare Other

## 2012-04-11 ENCOUNTER — Ambulatory Visit (INDEPENDENT_AMBULATORY_CARE_PROVIDER_SITE_OTHER)
Admission: RE | Admit: 2012-04-11 | Discharge: 2012-04-11 | Disposition: A | Discharge status: Home / Self Care | Payer: Medicare Other | Source: Ambulatory Visit | Attending: Pulmonary Disease | Admitting: Pulmonary Disease

## 2012-04-11 DIAGNOSIS — R911 Solitary pulmonary nodule: Secondary | ICD-10-CM

## 2012-04-23 ENCOUNTER — Emergency Department (HOSPITAL_COMMUNITY): Payer: Medicare Other

## 2012-04-23 ENCOUNTER — Emergency Department (HOSPITAL_COMMUNITY)
Admission: EM | Admit: 2012-04-23 | Discharge: 2012-04-23 | Disposition: A | Discharge status: Home / Self Care | Payer: Medicare Other | Attending: Emergency Medicine | Admitting: Emergency Medicine

## 2012-04-23 ENCOUNTER — Encounter (HOSPITAL_COMMUNITY): Payer: Self-pay | Admitting: *Deleted

## 2012-04-23 DIAGNOSIS — R209 Unspecified disturbances of skin sensation: Secondary | ICD-10-CM | POA: Insufficient documentation

## 2012-04-23 DIAGNOSIS — Z87891 Personal history of nicotine dependence: Secondary | ICD-10-CM | POA: Insufficient documentation

## 2012-04-23 DIAGNOSIS — I2510 Atherosclerotic heart disease of native coronary artery without angina pectoris: Secondary | ICD-10-CM | POA: Insufficient documentation

## 2012-04-23 DIAGNOSIS — Z9861 Coronary angioplasty status: Secondary | ICD-10-CM | POA: Insufficient documentation

## 2012-04-23 DIAGNOSIS — I252 Old myocardial infarction: Secondary | ICD-10-CM | POA: Insufficient documentation

## 2012-04-23 DIAGNOSIS — R51 Headache: Secondary | ICD-10-CM | POA: Insufficient documentation

## 2012-04-23 DIAGNOSIS — Z8679 Personal history of other diseases of the circulatory system: Secondary | ICD-10-CM | POA: Insufficient documentation

## 2012-04-23 DIAGNOSIS — Z79899 Other long term (current) drug therapy: Secondary | ICD-10-CM | POA: Insufficient documentation

## 2012-04-23 DIAGNOSIS — I4891 Unspecified atrial fibrillation: Secondary | ICD-10-CM | POA: Insufficient documentation

## 2012-04-23 DIAGNOSIS — I129 Hypertensive chronic kidney disease with stage 1 through stage 4 chronic kidney disease, or unspecified chronic kidney disease: Secondary | ICD-10-CM | POA: Insufficient documentation

## 2012-04-23 DIAGNOSIS — M542 Cervicalgia: Secondary | ICD-10-CM | POA: Insufficient documentation

## 2012-04-23 DIAGNOSIS — Z8739 Personal history of other diseases of the musculoskeletal system and connective tissue: Secondary | ICD-10-CM | POA: Insufficient documentation

## 2012-04-23 DIAGNOSIS — I5032 Chronic diastolic (congestive) heart failure: Secondary | ICD-10-CM | POA: Insufficient documentation

## 2012-04-23 DIAGNOSIS — E785 Hyperlipidemia, unspecified: Secondary | ICD-10-CM | POA: Insufficient documentation

## 2012-04-23 DIAGNOSIS — Z8673 Personal history of transient ischemic attack (TIA), and cerebral infarction without residual deficits: Secondary | ICD-10-CM | POA: Insufficient documentation

## 2012-04-23 DIAGNOSIS — N183 Chronic kidney disease, stage 3 unspecified: Secondary | ICD-10-CM | POA: Insufficient documentation

## 2012-04-23 NOTE — ED Notes (Signed)
Pt returned from Ct scan , awaiting results

## 2012-04-23 NOTE — ED Notes (Signed)
Pt. Stated, when I was getting back in bed at night , I hit my head and put a big bump on my head on the back.

## 2012-04-23 NOTE — ED Provider Notes (Signed)
History     CSN: [id number]  Arrival date & time 04/23/12  1210   First MD Initiated Contact with Patient 04/23/12 1228      Chief Complaint  Patient presents with  . Headache    (Consider location/radiation/quality/duration/timing/severity/associated sxs/prior treatment) HPI Patient slipped and fell in his home while walking 3-4 weeks ago landing on his occiput. Since the fall he complains of pain at the posterior neck radiating to occiput which feels like "pins and needles" worse with moving his head. Improved with remaining still. Mild. Treated with Tylenol with partial relief. Pain is mild at present. No other complaint. No focal numbness or weakness. No visual changes. No loss of consciousness. Past Medical History  Diagnosis Date  . CAD (coronary artery disease)     status post prior PCI to the obtuse marginal in 1997 and stenting to the mid to distal RCA in 2001; LHC 9/06:  LM ok, pLAD 50%, mLAD 60% then 60-70%, mRI 50%, mOM1 80-90% (small), pRCA and dRCA stents ok, PDA 50%, EF 65%;  Myoview 5/12: No ischemia, EF 64%.;  NSTEMI 11/12 tx medically   . Chronic kidney disease, stage III (moderate)   . TIA (transient ischemic attack)   . HTN (hypertension)   . Hyperlipidemia   . AF (atrial fibrillation) 02/16/11    Coumadin ==> patient decided to stop after admxn with worsening anemia in setting of UGI bleed, epistaxis  . Arthritis     "in my left ankle"  . Chronic diastolic heart failure   . Carotid stenosis     dopplers 03/2011: 0-39% bilateral  . MDS (myelodysplastic syndrome) 12/16/2011  . Hx of echocardiogram     Echocardiogram 02/17/11: Severe LVH, asymmetric hypertrophy, EF 50-55%, normal wall motion, high ventricular filling pressure, mild LAE, mild RVE, mildly reduced RVSF, mild RAE.  Marland Kitchen Myocardial infarction 2012  . Stroke     5 yrs ago.  Mini - NO RESIDUAL PROBLEMS  . Anemia of chronic disease     RECENT HOSPITALIZATION / BLOOD TRANSFUSION OCT 2013  . Shortness of  breath     WITH ANY ACTIVITY---  NO OXYGEN  . CHF (congestive heart failure)     CHRONIC DIASTOLIC HEART FAILURE  . Hydrocele, right    end-stage renal disease Past Surgical History  Procedure Date  . Nose surgery 1986    deviated septum  . Total knee arthroplasty 2009    left  . Joint replacement 2009    left knee  . Surgery scrotal / testicular 197O    CORRECTIVE SURGERY TO VARICOCELE (SWELLING OF SCROTUM)  . Esophagogastroduodenoscopy 12/09/2011    Procedure: ESOPHAGOGASTRODUODENOSCOPY (EGD);  Surgeon: Willis Modena, MD;  Location: [facility] ENDOSCOPY;  Service: Endoscopy;  Laterality: N/A;  outlaw/ebp  . Tonsillectomy and adenoidectomy 1929  . Appendectomy 1930's  . Left ear mastoid surgery 1933  . Removal of one testicle 1944  . Correction to botched varicocele surgery 1970  . Eye surgery 1993    SURGERY FOR DETACHED RETINA LEFT EYE  . Left cataract extraction with lens implant 1993  . Coronary angioplasty with stent placement     3 procedures; 3 stents total  1994 & 1996  . Right cataract extraction with lens implant   1997  . Transurethral resection of prostate 01/21/2012    Procedure: TRANSURETHRAL RESECTION OF THE PROSTATE WITH GYRUS INSTRUMENTS;  Surgeon: [name], MD;  Location: [facility] ORS;  Service: Urology;  Laterality: N/A;  . Cystoscopy 01/28/2012    Procedure:  CYSTOSCOPY;  Surgeon: [name], MD;  Location: [facility] ORS;  Service: Urology;  Laterality: N/A;  cystoscopy   . Hematoma evacuation 01/28/2012    Procedure: EVACUATION HEMATOMA;  Surgeon: [name], MD;  Location: [facility] ORS;  Service: Urology;  Laterality: N/A;   with fulgeration evacuation of clot  . Av fistula placement 02/08/2012    Procedure: ARTERIOVENOUS (AV) FISTULA CREATION;  Surgeon: Chuck Hint, MD;  Location: [facility] OR;  Service: Vascular;  Laterality: Left;  . Insertion of dialysis catheter 02/08/2012    Procedure: INSERTION OF DIALYSIS CATHETER;  Surgeon: Chuck Hint, MD;   Location: [facility] OR;  Service: Vascular;  Laterality: Right;  Diatek exchange Right Internal Jugular    No family history on file.  History  Substance Use Topics  . Smoking status: Former Smoker -- 2.0 packs/day for 40 years    Types: Cigarettes, Pipe    Quit date: 08/24/1978  . Smokeless tobacco: Never Used     Comment: 1980 quit smoking cigarrettes and pipe  . Alcohol Use: Yes     Comment: "glass of wine or beer once in awhile"      Review of Systems  Constitutional: Negative.   HENT: Positive for hearing loss and neck pain.        Chronically hard of hearing  Respiratory: Negative.   Cardiovascular: Negative.   Gastrointestinal: Negative.   Genitourinary:       Hemodialysis patient  Skin: Negative.   Neurological: Positive for headaches.       Walks with walker  Hematological: Negative.   Psychiatric/Behavioral: Negative.   All other systems reviewed and are negative.    Allergies  Oxycodone hcl and Prednisone  Home Medications   Current Outpatient Rx  Name  Route  Sig  Dispense  Refill  . ACETAMINOPHEN 500 MG PO TABS   Oral   Take 500 mg by mouth every 6 (six) hours as needed. For pain         . DARBEPOETIN ALFA-POLYSORBATE 150 MCG/0.75ML IJ SOLN   Injection   Inject 150 mg as directed every Monday, Wednesday, and Friday. At dialysis         . METOPROLOL TARTRATE 25 MG PO TABS   Oral   Take 12.5 mg by mouth 2 (two) times daily.         Marland Kitchen NITROGLYCERIN 0.4 MG SL SUBL   Sublingual   Place 0.4 mg under the tongue every 5 (five) minutes as needed. For chest pain           BP 113/64  Pulse 91  Temp 98.2 F (36.8 C) (Oral)  Resp 17  SpO2 98%  Physical Exam  Nursing note and vitals reviewed. Constitutional: He is oriented to person, place, and time. He appears well-developed and well-nourished.  HENT:  Head: Normocephalic and atraumatic.  Right Ear: External ear normal.  Left Ear: External ear normal.       Bilateral tympanic membranes  normal  Eyes: Conjunctivae normal are normal. Pupils are equal, round, and reactive to light.  Neck: Neck supple. No tracheal deviation present. No thyromegaly present.  Cardiovascular: Normal rate and regular rhythm.   No murmur heard. Pulmonary/Chest: Effort normal and breath sounds normal.  Abdominal: Soft. Bowel sounds are normal. He exhibits no distension. There is no tenderness.  Musculoskeletal: Normal range of motion. He exhibits no edema and no tenderness.  Neurological: He is alert and oriented to person, place, and time. He has normal reflexes. Coordination  normal.       Walks with walker without difficulty. Motor strength 5 over 5 overall  Skin: Skin is warm and dry. No rash noted.  Psychiatric: He has a normal mood and affect. His behavior is normal.    ED Course  Procedures (including critical care time)  Labs Reviewed - No data to display No results found.   No diagnosis found. Results for orders placed during the hospital encounter of 02/11/12  RENAL FUNCTION PANEL      Component Value Range   Sodium 136  135 - 145 mEq/L   Potassium 4.2  3.5 - 5.1 mEq/L   Chloride 99  96 - 112 mEq/L   CO2 26  19 - 32 mEq/L   Glucose, Bld 95  70 - 99 mg/dL   BUN 38 (*) 6 - 23 mg/dL   Creatinine, Ser 1.61 (*) 0.50 - 1.35 mg/dL   Calcium 8.0 (*) 8.4 - 10.5 mg/dL   Phosphorus 4.6  2.3 - 4.6 mg/dL   Albumin 2.3 (*) 3.5 - 5.2 g/dL   GFR calc non Af Amer 5 (*) >90 mL/min   GFR calc Af Amer 6 (*) >90 mL/min  CBC      Component Value Range   WBC 11.8 (*) 4.0 - 10.5 K/uL   RBC 2.90 (*) 4.22 - 5.81 MIL/uL   Hemoglobin 8.3 (*) 13.0 - 17.0 g/dL   HCT 09.6 (*) 04.5 - 40.9 %   MCV 92.4  78.0 - 100.0 fL   MCH 28.6  26.0 - 34.0 pg   MCHC 31.0  30.0 - 36.0 g/dL   RDW 81.1 (*) 91.4 - 78.2 %   Platelets 91 (*) 150 - 400 K/uL  CBC      Component Value Range   WBC 7.2  4.0 - 10.5 K/uL   RBC 2.78 (*) 4.22 - 5.81 MIL/uL   Hemoglobin 7.8 (*) 13.0 - 17.0 g/dL   HCT 95.6 (*) 21.3 - 08.6 %    MCV 90.3  78.0 - 100.0 fL   MCH 28.1  26.0 - 34.0 pg   MCHC 31.1  30.0 - 36.0 g/dL   RDW 57.8 (*) 46.9 - 62.9 %   Platelets 100 (*) 150 - 400 K/uL  RENAL FUNCTION PANEL      Component Value Range   Sodium 139  135 - 145 mEq/L   Potassium 3.6  3.5 - 5.1 mEq/L   Chloride 99  96 - 112 mEq/L   CO2 27  19 - 32 mEq/L   Glucose, Bld 104 (*) 70 - 99 mg/dL   BUN 19  6 - 23 mg/dL   Creatinine, Ser 5.28 (*) 0.50 - 1.35 mg/dL   Calcium 8.2 (*) 8.4 - 10.5 mg/dL   Phosphorus 4.0  2.3 - 4.6 mg/dL   Albumin 2.6 (*) 3.5 - 5.2 g/dL   GFR calc non Af Amer 7 (*) >90 mL/min   GFR calc Af Amer 8 (*) >90 mL/min  CBC      Component Value Range   WBC 4.8  4.0 - 10.5 K/uL   RBC 2.76 (*) 4.22 - 5.81 MIL/uL   Hemoglobin 7.8 (*) 13.0 - 17.0 g/dL   HCT 41.3 (*) 24.4 - 01.0 %   MCV 91.3  78.0 - 100.0 fL   MCH 28.3  26.0 - 34.0 pg   MCHC 31.0  30.0 - 36.0 g/dL   RDW 27.2 (*) 53.6 - 64.4 %   Platelets 111 (*) 150 -  400 K/uL  RENAL FUNCTION PANEL      Component Value Range   Sodium 136  135 - 145 mEq/L   Potassium 4.1  3.5 - 5.1 mEq/L   Chloride 99  96 - 112 mEq/L   CO2 27  19 - 32 mEq/L   Glucose, Bld 124 (*) 70 - 99 mg/dL   BUN 20  6 - 23 mg/dL   Creatinine, Ser 2.13 (*) 0.50 - 1.35 mg/dL   Calcium 8.5  8.4 - 08.6 mg/dL   Phosphorus 4.4  2.3 - 4.6 mg/dL   Albumin 2.6 (*) 3.5 - 5.2 g/dL   GFR calc non Af Amer 6 (*) >90 mL/min   GFR calc Af Amer 7 (*) >90 mL/min  CBC      Component Value Range   WBC 5.6  4.0 - 10.5 K/uL   RBC 2.63 (*) 4.22 - 5.81 MIL/uL   Hemoglobin 7.7 (*) 13.0 - 17.0 g/dL   HCT 57.8 (*) 46.9 - 62.9 %   MCV 92.0  78.0 - 100.0 fL   MCH 29.3  26.0 - 34.0 pg   MCHC 31.8  30.0 - 36.0 g/dL   RDW 52.8 (*) 41.3 - 24.4 %   Platelets 137 (*) 150 - 400 K/uL  RENAL FUNCTION PANEL      Component Value Range   Sodium 136  135 - 145 mEq/L   Potassium 3.6  3.5 - 5.1 mEq/L   Chloride 98  96 - 112 mEq/L   CO2 27  19 - 32 mEq/L   Glucose, Bld 108 (*) 70 - 99 mg/dL   BUN 17  6 - 23 mg/dL     Creatinine, Ser 0.10 (*) 0.50 - 1.35 mg/dL   Calcium 8.8  8.4 - 27.2 mg/dL   Phosphorus 3.7  2.3 - 4.6 mg/dL   Albumin 2.8 (*) 3.5 - 5.2 g/dL   GFR calc non Af Amer 7 (*) >90 mL/min   GFR calc Af Amer 8 (*) >90 mL/min   Ct Head Wo Contrast  04/23/2012  *RADIOLOGY REPORT*  Clinical Data:  79 year old male status post fall.  Pain and tingling.  CT HEAD WITHOUT CONTRAST CT CERVICAL SPINE WITHOUT CONTRAST  Technique:  Multidetector CT imaging of the head and cervical spine was performed following the standard protocol without intravenous contrast.  Multiplanar CT image reconstructions of the cervical spine were also generated.  Comparison:  Head CTs 02/01/2012 and earlier.  Cervical spine MRI 01/19/2005.  CT HEAD  Findings: Stable orbits.  No scalp hematoma identified.  Sequelae of left mastoidectomy again noted.  Increased opacification at the surgical bed.  Right mastoids are stable and clear.  Increased paranasal sinus opacification at the left sphenoid, also with bubbly opacity.  Other paranasal sinuses appear clear.  Calvarium intact.  Calcified atherosclerosis at the skull base.  No ventriculomegaly. Stable dural calcifications.  Patchy and confluent white matter hypodensity is stable. No midline shift, mass effect, or evidence of mass lesion.  No acute intracranial hemorrhage identified.  No evidence of cortically based acute infarction identified.  No suspicious intracranial vascular hyperdensity.  IMPRESSION: 1. No acute intracranial abnormality.  No acute traumatic injury identified. 2.  Stable chronic white matter changes. 3.  New mild inflammatory changes of the left sphenoid sinus and left mastoids. 4.  Cervical spine CT findings below.  CT CERVICAL SPINE  Findings:  chronic straightening and mild reversal of cervical lordosis. Visualized skull base is intact.  No atlanto-occipital dissociation.  Cervicothoracic  junction alignment is within normal limits.  Bilateral posterior element alignment is  within normal limits.  Multilevel advanced cervical disc, endplate, and facet degeneration.  Chronic spinal stenosis at C5-C6 appears increased since 2006 and is probably moderate.  No cervical spine fracture. Visible upper thoracic levels are grossly intact.  There is a dual lumen right IJ approach catheter partially visible.  Mild scarring in the right lung apex.  Calcified atherosclerosis.  IMPRESSION:  1. No acute fracture or listhesis identified in the cervical spine. Ligamentous injury is not excluded. 2.  Cervical spine degenerative changes with the chronic to C5-C6 degenerative spinal stenosis which has probably progressed since 2006.   Original Report Authenticated By: Erskine Speed, M.D.    Ct Chest Wo Contrast  04/11/2012  *RADIOLOGY REPORT*  Clinical Data: Pulmonary nodules.  Bronchitis.  Chest congestion.  CT CHEST WITHOUT CONTRAST  Technique:  Multidetector CT imaging of the chest was performed following the standard protocol without IV contrast.  Comparison: Chest x-ray dated 02/10/2012 and CT chest / abdomen / pelvis dated 08/26/2011  Findings: The pleural based nodule in the superior segment left lower lobe has resolved since the prior study.  The small nodule in the right middle lobe has diminished in size.  There are decreased areas of interstitial accentuation throughout both lungs.  The mediastinal adenopathy noted on the prior study is stable. Calcified lymph nodes in the subcarinal area are again noted. Extensive coronary artery calcification is again noted.  The patient has areas of diminished inflammatory disease in the right lower lobe although there are is a small new area of faint patchy infiltrate in the medial aspect of the right lower lobe as well as a tiny area of infiltrate in the right upper lobe.  There is a more extensive area of infiltrate at the left lung base posteriorly with a small left pleural effusion.  Interstitial accentuation in both lower lobes has diminished.  Heart  size is within normal limits.  Visualized portion of the upper abdomen is normal.  IMPRESSION:  1.  The two small pulmonary nodules noted on the prior study have either diminished or resolved as described above. 2.  Stable mediastinal  adenopathy. 3.  New area of streaky infiltrate in the left lower lobe with new loculated small left pleural effusion. 4.  Areas of decreased inflammation in both lungs since the prior study replaced by  less prominent small patchy areas of infiltrate in the right upper and lower lobe.   Original Report Authenticated By: Francene Boyers, M.D.    Ct Cervical Spine Wo Contrast  04/23/2012  *RADIOLOGY REPORT*  Clinical Data:  79 year old male status post fall.  Pain and tingling.  CT HEAD WITHOUT CONTRAST CT CERVICAL SPINE WITHOUT CONTRAST  Technique:  Multidetector CT imaging of the head and cervical spine was performed following the standard protocol without intravenous contrast.  Multiplanar CT image reconstructions of the cervical spine were also generated.  Comparison:  Head CTs 02/01/2012 and earlier.  Cervical spine MRI 01/19/2005.  CT HEAD  Findings: Stable orbits.  No scalp hematoma identified.  Sequelae of left mastoidectomy again noted.  Increased opacification at the surgical bed.  Right mastoids are stable and clear.  Increased paranasal sinus opacification at the left sphenoid, also with bubbly opacity.  Other paranasal sinuses appear clear.  Calvarium intact.  Calcified atherosclerosis at the skull base.  No ventriculomegaly. Stable dural calcifications.  Patchy and confluent white matter hypodensity is stable. No midline shift, mass effect,  or evidence of mass lesion.  No acute intracranial hemorrhage identified.  No evidence of cortically based acute infarction identified.  No suspicious intracranial vascular hyperdensity.  IMPRESSION: 1. No acute intracranial abnormality.  No acute traumatic injury identified. 2.  Stable chronic white matter changes. 3.  New mild  inflammatory changes of the left sphenoid sinus and left mastoids. 4.  Cervical spine CT findings below.  CT CERVICAL SPINE  Findings:  chronic straightening and mild reversal of cervical lordosis. Visualized skull base is intact.  No atlanto-occipital dissociation.  Cervicothoracic junction alignment is within normal limits.  Bilateral posterior element alignment is within normal limits.  Multilevel advanced cervical disc, endplate, and facet degeneration.  Chronic spinal stenosis at C5-C6 appears increased since 2006 and is probably moderate.  No cervical spine fracture. Visible upper thoracic levels are grossly intact.  There is a dual lumen right IJ approach catheter partially visible.  Mild scarring in the right lung apex.  Calcified atherosclerosis.  IMPRESSION:  1. No acute fracture or listhesis identified in the cervical spine. Ligamentous injury is not excluded. 2.  Cervical spine degenerative changes with the chronic to C5-C6 degenerative spinal stenosis which has probably progressed since 2006.   Original Report Authenticated By: Erskine Speed, M.D.      Declined pain medicine presently 3:05 PM patient resting comfortably alert Glasgow Coma Score 15 MDM  CT scan reports reviewed. I feel the patient suffering from postconcussive headache as result fall Plan Tylenol for pain follow up with Dr. Doristine Counter as needed Diagnosis #1 post concussive headache #2 degenerative arthritis of the cervical spine        Doug Sou, MD 04/23/12 [phone]

## 2012-04-23 NOTE — ED Notes (Signed)
Pt has equal grips and strength, no bump to the back of head, pt still reports tingling in the neck that radiates up to the head, there is pain to palpation

## 2012-04-23 NOTE — ED Notes (Signed)
MD at bedside., along with family

## 2012-04-23 NOTE — ED Notes (Signed)
Patient transported to CT 

## 2012-05-18 ENCOUNTER — Other Ambulatory Visit: Payer: Self-pay | Admitting: *Deleted

## 2012-05-19 ENCOUNTER — Encounter (HOSPITAL_COMMUNITY)
Admission: RE | Admit: 2012-05-19 | Discharge: 2012-05-19 | Disposition: A | Discharge status: Home / Self Care | Payer: Medicare Other | Source: Ambulatory Visit | Attending: Nephrology | Admitting: Nephrology

## 2012-05-19 DIAGNOSIS — D649 Anemia, unspecified: Secondary | ICD-10-CM | POA: Insufficient documentation

## 2012-05-19 NOTE — Progress Notes (Signed)
Cyndie Chime, PAC in dialysis catheter removed; pt. Tolerated well; d/c home; dressing CDI

## 2012-05-19 NOTE — Progress Notes (Signed)
[facility] SHORT STAY H&P  CC:  Catheter removal   HPI:  This is a 79 y.o. male here for diatek catheter removal.   He has a working left upper arm fistula.   Past Medical History  Diagnosis Date  . CAD (coronary artery disease)     status post prior PCI to the obtuse marginal in 1997 and stenting to the mid to distal RCA in 2001; LHC 9/06:  LM ok, pLAD 50%, mLAD 60% then 60-70%, mRI 50%, mOM1 80-90% (small), pRCA and dRCA stents ok, PDA 50%, EF 65%;  Myoview 5/12: No ischemia, EF 64%.;  NSTEMI 11/12 tx medically   . Chronic kidney disease, stage III (moderate)   . TIA (transient ischemic attack)   . HTN (hypertension)   . Hyperlipidemia   . AF (atrial fibrillation) 02/16/11    Coumadin ==> patient decided to stop after admxn with worsening anemia in setting of UGI bleed, epistaxis  . Arthritis     "in my left ankle"  . Chronic diastolic heart failure   . Carotid stenosis     dopplers 03/2011: 0-39% bilateral  . MDS (myelodysplastic syndrome) 12/16/2011  . Hx of echocardiogram     Echocardiogram 02/17/11: Severe LVH, asymmetric hypertrophy, EF 50-55%, normal wall motion, high ventricular filling pressure, mild LAE, mild RVE, mildly reduced RVSF, mild RAE.  Marland Kitchen Myocardial infarction 2012  . Stroke     5 yrs ago.  Mini - NO RESIDUAL PROBLEMS  . Anemia of chronic disease     RECENT HOSPITALIZATION / BLOOD TRANSFUSION OCT 2013  . Shortness of breath     WITH ANY ACTIVITY---  NO OXYGEN  . CHF (congestive heart failure)     CHRONIC DIASTOLIC HEART FAILURE  . Hydrocele, right     FH:  Non-Contributory  History   Social History  . Marital Status: Married    Spouse Name: N/A    Number of Children: N/A  . Years of Education: N/A   Occupational History  .     Social History Main Topics  . Smoking status: Former Smoker -- 2.00 packs/day for 40 years    Types: Cigarettes, Pipe    Quit date: 08/24/1978  . Smokeless tobacco: Never Used     Comment: 1980 quit  smoking cigarrettes and pipe  . Alcohol Use: Yes     Comment: "glass of wine or beer once in awhile"  . Drug Use: No  . Sexually Active: Yes   Other Topics Concern  . Not on file   Social History Narrative  . No narrative on file    Allergies  Allergen Reactions  . Oxycodone Hcl Nausea Only and Other (See Comments)    Wanted to climb the wall and him dizzy  . Prednisone Other (See Comments)    Gain weight     Current Outpatient Prescriptions  Medication Sig Dispense Refill  . acetaminophen (TYLENOL) 500 MG tablet Take 500 mg by mouth every 6 (six) hours as needed. For pain      . Darbepoetin Alfa-Polysorbate (ARANESP, ALBUMIN FREE,) 150 MCG/0.75ML SOLN Inject 150 mg as directed every Monday, Wednesday, and Friday. At dialysis      . metoprolol tartrate (LOPRESSOR) 25 MG tablet Take 12.5 mg by mouth 2 (two) times daily.      . nitroGLYCERIN (NITROSTAT) 0.4 MG SL tablet Place 0.4 mg under the tongue every 5 (five) minutes as needed. For chest pain       No current facility-administered  medications for this encounter.    ROS:  See HPI  PHYSICAL EXAM  Filed Vitals:   05/19/12 1041  BP: 105/58  Pulse: 90  Temp: 98 F (36.7 C)  Resp: 18    Gen:  Well developed well nourished HEENT:  normocephalic Neck:  Supple palpable right IJ catheter Heart:  RRR Lungs:  Non-labored Abdomen:  soft Extremities:  N/V/M intact.  Palpable thrill left upper arm. Skin:  No obvious rashes    Impression: This is a 79 y.o. male here for diatek catheter removal  Plan:  Removal of right diatek catheter  [name], PA-C [facility] [phone] 05/19/2012 12:02 PM   [facility] Catheter Removal Procedure Note  Diagnosis: ESRD with Functioning AVF/AVGG  Plan:  Remove right diatek catheter  Consent signed:  yes Time out completed:  yes Coumadin:  no PT/INR (if applicable):   Other labs:   Procedure: 1.  Sterile prepping and  draping over catheter area 2. 0 ml 2% lidocaine plain instilled at removal site. 3.  right catheter removed in its entirety with cuff in tact. 4.  Complications: none  5. Tip of catheter sent for culture:  no   Patient tolerated procedure well:  yes Pressure held, no bleeding noted, dressing applied Instructions given to the pt regarding wound care and bleeding.  OtherClinton Gallant [facility] 05/19/2012 12:03 PM

## 2012-05-22 ENCOUNTER — Other Ambulatory Visit (HOSPITAL_COMMUNITY): Payer: Self-pay | Admitting: *Deleted

## 2012-05-23 ENCOUNTER — Ambulatory Visit (INDEPENDENT_AMBULATORY_CARE_PROVIDER_SITE_OTHER): Payer: Medicare Other | Admitting: Cardiology

## 2012-05-23 ENCOUNTER — Ambulatory Visit (HOSPITAL_COMMUNITY)
Admission: RE | Admit: 2012-05-23 | Discharge: 2012-05-23 | Disposition: A | Discharge status: Home / Self Care | Payer: Medicare Other | Source: Ambulatory Visit | Attending: Nephrology | Admitting: Nephrology

## 2012-05-23 ENCOUNTER — Encounter: Payer: Self-pay | Admitting: Cardiology

## 2012-05-23 VITALS — BP 112/56 | HR 92 | Ht 73.0 in | Wt 158.0 lb

## 2012-05-23 DIAGNOSIS — N186 End stage renal disease: Secondary | ICD-10-CM | POA: Insufficient documentation

## 2012-05-23 DIAGNOSIS — D638 Anemia in other chronic diseases classified elsewhere: Secondary | ICD-10-CM | POA: Insufficient documentation

## 2012-05-23 DIAGNOSIS — I498 Other specified cardiac arrhythmias: Secondary | ICD-10-CM

## 2012-05-23 DIAGNOSIS — I120 Hypertensive chronic kidney disease with stage 5 chronic kidney disease or end stage renal disease: Secondary | ICD-10-CM | POA: Insufficient documentation

## 2012-05-23 DIAGNOSIS — I214 Non-ST elevation (NSTEMI) myocardial infarction: Secondary | ICD-10-CM

## 2012-05-23 DIAGNOSIS — D469 Myelodysplastic syndrome, unspecified: Secondary | ICD-10-CM | POA: Insufficient documentation

## 2012-05-23 LAB — PREPARE RBC (CROSSMATCH)

## 2012-05-23 MED ORDER — DIPHENHYDRAMINE HCL 25 MG PO CAPS: 25.0000 mg | ORAL_CAPSULE | Freq: Once | ORAL | Status: DC

## 2012-05-23 MED ORDER — ACETAMINOPHEN 325 MG PO TABS: 650.0000 mg | ORAL_TABLET | Freq: Once | ORAL | Status: DC

## 2012-05-23 NOTE — Progress Notes (Signed)
HPI He has a history of CAD, status post prior PCI to the OM in 1997 and stenting to the mid to distal RCA in 200.   Last [facility] 9/06 demonstrated LM ok, pLAD 50%, mLAD 60% then 60-70%, mRI 50%, mOM1 80-90% (small), pRCA and dRCA stents ok, PDA 50%, EF 65%. Thus, he had high-grade small vessel disease and diffuse nonobstructive large vessel disease. This was treated medically. Last Myoview 5/12 demonstrated no ischemia, EF 64%.   He was 01/2011 with NSTEMI in the setting of diastolic heart failure. This was felt to be secondary to AFib with RVR. He was started on Coumadin. With his CKD, age and recent low risk Myoview, medical therapy was pursued. Echocardiogram 02/17/11 demonstrated severe LVH, asymmetric hypertrophy, EF 50-55%, normal wall motion, high ventricular filling pressure, mild LAE, mild RVE, mildly reduced RVSF, mild RAE.    He was admitted to [facility] in October for TURP. His hospital course was complicated by acute blood loss anemia and acute on chronic volume overload requiring hemodialysis. He has since had an AV fistula placed.  He was in rehabilitation following this.  He now is having problems with hypotension during dialysis. He is very weak and has some decreased balance. He's not noticing any new palpitations, presyncope or syncope. He has no chest pressure, neck or arm discomfort. He has been eating a little bit better recently. He has had some increased lower extremity swelling.   Allergies  Allergen Reactions  . Oxycodone Hcl Nausea Only and Other (See Comments)    Wanted to climb the wall and him dizzy  . Prednisone Other (See Comments)    Gain weight     Current Outpatient Prescriptions  Medication Sig Dispense Refill  . acetaminophen (TYLENOL) 500 MG tablet Take 500 mg by mouth every 6 (six) hours as needed. For pain      . Darbepoetin Alfa-Polysorbate (ARANESP, ALBUMIN FREE,) 150 MCG/0.75ML SOLN Inject 150 mg as directed every Monday, Wednesday, and Friday. At dialysis       . metoprolol tartrate (LOPRESSOR) 25 MG tablet Take 12.5 mg by mouth 2 (two) times daily.      . nitroGLYCERIN (NITROSTAT) 0.4 MG SL tablet Place 0.4 mg under the tongue every 5 (five) minutes as needed. For chest pain       No current facility-administered medications for this visit.   Facility-Administered Medications Ordered in Other Visits  Medication Dose Route Frequency Provider Last Rate Last Dose  . acetaminophen (TYLENOL) tablet 650 mg  650 mg Oral Once Sadie Haber, MD      . diphenhydrAMINE (BENADRYL) capsule 25 mg  25 mg Oral Once Sadie Haber, MD        Past Medical History  Diagnosis Date  . CAD (coronary artery disease)     status post prior PCI to the obtuse marginal in 1997 and stenting to the mid to distal RCA in 2001; [facility] 9/06:  LM ok, pLAD 50%, mLAD 60% then 60-70%, mRI 50%, mOM1 80-90% (small), pRCA and dRCA stents ok, PDA 50%, EF 65%;  Myoview 5/12: No ischemia, EF 64%.;  NSTEMI 11/12 tx medically   . Chronic kidney disease, stage III (moderate)   . TIA (transient ischemic attack)   . HTN (hypertension)   . Hyperlipidemia   . AF (atrial fibrillation) 02/16/11    Coumadin ==> patient decided to stop after admxn with worsening anemia in setting of UGI bleed, epistaxis  . Arthritis     "in my  left ankle"  . Chronic diastolic heart failure   . Carotid stenosis     dopplers 03/2011: 0-39% bilateral  . MDS (myelodysplastic syndrome) 12/16/2011  . Hx of echocardiogram     Echocardiogram 02/17/11: Severe LVH, asymmetric hypertrophy, EF 50-55%, normal wall motion, high ventricular filling pressure, mild LAE, mild RVE, mildly reduced RVSF, mild RAE.  Marland Kitchen Myocardial infarction 2012  . Stroke     5 yrs ago.  Mini - NO RESIDUAL PROBLEMS  . Anemia of chronic disease     RECENT HOSPITALIZATION / BLOOD TRANSFUSION OCT 2013  . Shortness of breath     WITH ANY ACTIVITY---  NO OXYGEN  . CHF (congestive heart failure)     CHRONIC DIASTOLIC HEART FAILURE  . Hydrocele,  right     Past Surgical History  Procedure Laterality Date  . Nose surgery  1986    deviated septum  . Total knee arthroplasty  2009    left  . Joint replacement  2009    left knee  . Surgery scrotal / testicular  197O    CORRECTIVE SURGERY TO VARICOCELE (SWELLING OF SCROTUM)  . Esophagogastroduodenoscopy  12/09/2011    Procedure: ESOPHAGOGASTRODUODENOSCOPY (EGD);  Surgeon: Willis Modena, MD;  Location: [facility] ENDOSCOPY;  Service: Endoscopy;  Laterality: N/A;  outlaw/ebp  . Tonsillectomy and adenoidectomy  1929  . Appendectomy  1930's  . Left ear mastoid surgery  1933  . Removal of one testicle  1944  . Correction to botched varicocele surgery  1970  . Eye surgery  1993    SURGERY FOR DETACHED RETINA LEFT EYE  . Left cataract extraction with lens implant  1993  . Coronary angioplasty with stent placement      3 procedures; 3 stents total  1994 & 1996  . Right cataract extraction with lens implant    1997  . Transurethral resection of prostate  01/21/2012    Procedure: TRANSURETHRAL RESECTION OF THE PROSTATE WITH GYRUS INSTRUMENTS;  Surgeon: [name], MD;  Location: [facility] ORS;  Service: Urology;  Laterality: N/A;  . Cystoscopy  01/28/2012    Procedure: CYSTOSCOPY;  Surgeon: [name], MD;  Location: [facility] ORS;  Service: Urology;  Laterality: N/A;  cystoscopy   . Hematoma evacuation  01/28/2012    Procedure: EVACUATION HEMATOMA;  Surgeon: [name], MD;  Location: [facility] ORS;  Service: Urology;  Laterality: N/A;   with fulgeration evacuation of clot  . Av fistula placement  02/08/2012    Procedure: ARTERIOVENOUS (AV) FISTULA CREATION;  Surgeon: Chuck Hint, MD;  Location: [facility] OR;  Service: Vascular;  Laterality: Left;  . Insertion of dialysis catheter  02/08/2012    Procedure: INSERTION OF DIALYSIS CATHETER;  Surgeon: Chuck Hint, MD;  Location: [facility] OR;  Service: Vascular;  Laterality: Right;  Diatek exchange Right Internal Jugular    ROS:  Fatigue, decreased  balance, 52 pound weight loss.  Otherwise as stated in the HPI and negative for all other systems.  PHYSICAL EXAM BP 112/56  Pulse 92  Ht 6\' 1"  (1.854 m)  Wt 158 lb (71.668 kg)  BMI 20.85 kg/m2  SpO2 99% GENERAL:  Very frail appearing HEENT:  Pupils equal round and reactive, fundi not visualized, oral mucosa unremarkable NECK:  No jugular venous distention, waveform within normal limits, carotid upstroke brisk and symmetric, no bruits, no thyromegaly LYMPHATICS:  No cervical, inguinal adenopathy LUNGS:  Clear to auscultation bilaterally BACK:  No CVA tenderness CHEST:  Unremarkable HEART:  PMI not  displaced or sustained,S1 and S2 within normal limits, no S3,  no clicks, no rubs, no murmurs, irregular ABD:  Flat, positive bowel sounds normal in frequency in pitch, no bruits, no rebound, no guarding, no midline pulsatile mass, no hepatomegaly, no splenomegaly EXT:  2 plus pulses throughout, no edema, no cyanosis no clubbing, diffuse muscle wasting, left upper arm dialysis fistula SKIN:  No rashes no nodules, multiple bruises NEURO:  Cranial nerves II through XII grossly intact, motor grossly intact throughout [facility]:  Cognitively intact, oriented to person place and time  EKG:  Atrial fibrillation, rate 92, left axis deviation, left anterior fascicular block, lateral T-wave inversions unchanged from previous 05/23/2012  ASSESSMENT AND PLAN   Atrial fibrillation -  Mr. Vania Rea Fullenwider has a CHA2DS2 - VASc score of 5 with a risk of stroke of 6.7%. However, he has decreased balance, mild dysplastic anemia requiring occasional transfusions. He is very frail.  Given this I agree with no systemic anticoagulation. He's having hypotension with dialysis and so I will stop his beta blocker. If in the future he's noted to have tachycardia arrhythmias, as suggested by Dr. Caryn Section, amiodarone might be a good choice.  EDEMA - I would like to treat this conservatively with volume management per dialysis,  keeping his feet elevated and compression stockings. We discussed this.  CAD - Continues with conservative therapy and has no ongoing angina.  RENAL INSUFFICIENCY -  The patient will continue with dialysis and management per Dr. Caryn Section.

## 2012-05-23 NOTE — Patient Instructions (Addendum)
Your physician recommends that you schedule a follow-up appointment in: 4 MONTHS WITH DR HOCHREIN  STOP METOPROLOL  ELEVATE LEGS AS MUCH AS POSSIBLE  WEAR SUPPORT STOCKINGS DURING THE DAY AND REMOVE AT NIGHT

## 2012-05-24 LAB — TYPE AND SCREEN

## 2012-06-16 ENCOUNTER — Inpatient Hospital Stay (HOSPITAL_COMMUNITY)
Admission: EM | Admit: 2012-06-16 | Discharge: 2012-06-27 | DRG: 085 | Disposition: E | Payer: Medicare Other | Attending: Internal Medicine | Admitting: Internal Medicine

## 2012-06-16 ENCOUNTER — Encounter (HOSPITAL_COMMUNITY): Payer: Self-pay | Admitting: *Deleted

## 2012-06-16 ENCOUNTER — Inpatient Hospital Stay (HOSPITAL_COMMUNITY): Payer: Medicare Other

## 2012-06-16 ENCOUNTER — Emergency Department (HOSPITAL_COMMUNITY): Payer: Medicare Other

## 2012-06-16 DIAGNOSIS — I5042 Chronic combined systolic (congestive) and diastolic (congestive) heart failure: Secondary | ICD-10-CM | POA: Diagnosis present

## 2012-06-16 DIAGNOSIS — I4891 Unspecified atrial fibrillation: Secondary | ICD-10-CM | POA: Diagnosis present

## 2012-06-16 DIAGNOSIS — Z9181 History of falling: Secondary | ICD-10-CM

## 2012-06-16 DIAGNOSIS — Z66 Do not resuscitate: Secondary | ICD-10-CM | POA: Diagnosis present

## 2012-06-16 DIAGNOSIS — Z992 Dependence on renal dialysis: Secondary | ICD-10-CM

## 2012-06-16 DIAGNOSIS — N179 Acute kidney failure, unspecified: Secondary | ICD-10-CM | POA: Diagnosis present

## 2012-06-16 DIAGNOSIS — N186 End stage renal disease: Secondary | ICD-10-CM | POA: Diagnosis present

## 2012-06-16 DIAGNOSIS — R569 Unspecified convulsions: Secondary | ICD-10-CM

## 2012-06-16 DIAGNOSIS — Z8673 Personal history of transient ischemic attack (TIA), and cerebral infarction without residual deficits: Secondary | ICD-10-CM

## 2012-06-16 DIAGNOSIS — N2581 Secondary hyperparathyroidism of renal origin: Secondary | ICD-10-CM | POA: Diagnosis present

## 2012-06-16 DIAGNOSIS — I2510 Atherosclerotic heart disease of native coronary artery without angina pectoris: Secondary | ICD-10-CM | POA: Diagnosis present

## 2012-06-16 DIAGNOSIS — W19XXXA Unspecified fall, initial encounter: Secondary | ICD-10-CM

## 2012-06-16 DIAGNOSIS — J690 Pneumonitis due to inhalation of food and vomit: Secondary | ICD-10-CM | POA: Diagnosis present

## 2012-06-16 DIAGNOSIS — I252 Old myocardial infarction: Secondary | ICD-10-CM

## 2012-06-16 DIAGNOSIS — I120 Hypertensive chronic kidney disease with stage 5 chronic kidney disease or end stage renal disease: Secondary | ICD-10-CM | POA: Diagnosis present

## 2012-06-16 DIAGNOSIS — Z96659 Presence of unspecified artificial knee joint: Secondary | ICD-10-CM

## 2012-06-16 DIAGNOSIS — R296 Repeated falls: Secondary | ICD-10-CM | POA: Diagnosis present

## 2012-06-16 DIAGNOSIS — S065X0A Traumatic subdural hemorrhage without loss of consciousness, initial encounter: Principal | ICD-10-CM | POA: Diagnosis present

## 2012-06-16 DIAGNOSIS — D72823 Leukemoid reaction: Secondary | ICD-10-CM | POA: Diagnosis present

## 2012-06-16 DIAGNOSIS — Z87891 Personal history of nicotine dependence: Secondary | ICD-10-CM

## 2012-06-16 DIAGNOSIS — D469 Myelodysplastic syndrome, unspecified: Secondary | ICD-10-CM | POA: Diagnosis present

## 2012-06-16 DIAGNOSIS — D72829 Elevated white blood cell count, unspecified: Secondary | ICD-10-CM | POA: Diagnosis present

## 2012-06-16 DIAGNOSIS — I498 Other specified cardiac arrhythmias: Secondary | ICD-10-CM | POA: Diagnosis not present

## 2012-06-16 DIAGNOSIS — I959 Hypotension, unspecified: Secondary | ICD-10-CM | POA: Diagnosis present

## 2012-06-16 DIAGNOSIS — I428 Other cardiomyopathies: Secondary | ICD-10-CM | POA: Diagnosis present

## 2012-06-16 DIAGNOSIS — G40309 Generalized idiopathic epilepsy and epileptic syndromes, not intractable, without status epilepticus: Secondary | ICD-10-CM | POA: Diagnosis present

## 2012-06-16 DIAGNOSIS — I5032 Chronic diastolic (congestive) heart failure: Secondary | ICD-10-CM | POA: Diagnosis present

## 2012-06-16 DIAGNOSIS — R55 Syncope and collapse: Secondary | ICD-10-CM | POA: Diagnosis present

## 2012-06-16 DIAGNOSIS — D61818 Other pancytopenia: Secondary | ICD-10-CM | POA: Diagnosis present

## 2012-06-16 DIAGNOSIS — R092 Respiratory arrest: Secondary | ICD-10-CM | POA: Diagnosis not present

## 2012-06-16 DIAGNOSIS — I6200 Nontraumatic subdural hemorrhage, unspecified: Secondary | ICD-10-CM

## 2012-06-16 DIAGNOSIS — S065X9A Traumatic subdural hemorrhage with loss of consciousness of unspecified duration, initial encounter: Secondary | ICD-10-CM

## 2012-06-16 DIAGNOSIS — S60512A Abrasion of left hand, initial encounter: Secondary | ICD-10-CM

## 2012-06-16 DIAGNOSIS — G92 Toxic encephalopathy: Secondary | ICD-10-CM | POA: Diagnosis present

## 2012-06-16 DIAGNOSIS — E785 Hyperlipidemia, unspecified: Secondary | ICD-10-CM | POA: Diagnosis present

## 2012-06-16 DIAGNOSIS — G929 Unspecified toxic encephalopathy: Secondary | ICD-10-CM | POA: Diagnosis present

## 2012-06-16 HISTORY — DX: Dependence on renal dialysis: Z99.2

## 2012-06-16 LAB — CBC WITH DIFFERENTIAL/PLATELET
Blasts: 0 %
Eosinophils Absolute: 0.5 10*3/uL (ref 0.0–0.7)
Eosinophils Relative: 1 % (ref 0–5)
Lymphocytes Relative: 9 % — ABNORMAL LOW (ref 12–46)
Lymphs Abs: 4.1 10*3/uL — ABNORMAL HIGH (ref 0.7–4.0)
MCV: 104.6 fL — ABNORMAL HIGH (ref 78.0–100.0)
Metamyelocytes Relative: 1 %
Monocytes Absolute: 37.2 10*3/uL — ABNORMAL HIGH (ref 0.1–1.0)
Monocytes Relative: 82 % — ABNORMAL HIGH (ref 3–12)
Neutro Abs: 3.2 10*3/uL (ref 1.7–7.7)
Platelets: 82 10*3/uL — ABNORMAL LOW (ref 150–400)
RBC: 2.39 MIL/uL — ABNORMAL LOW (ref 4.22–5.81)
RDW: 23.2 % — ABNORMAL HIGH (ref 11.5–15.5)
WBC: 45.5 10*3/uL — ABNORMAL HIGH (ref 4.0–10.5)
nRBC: 0 /100 WBC

## 2012-06-16 LAB — BASIC METABOLIC PANEL
BUN: 24 mg/dL — ABNORMAL HIGH (ref 6–23)
CO2: 22 mEq/L (ref 19–32)
Calcium: 9.2 mg/dL (ref 8.4–10.5)
Chloride: 95 mEq/L — ABNORMAL LOW (ref 96–112)
Creatinine, Ser: 4.99 mg/dL — ABNORMAL HIGH (ref 0.50–1.35)
Glucose, Bld: 100 mg/dL — ABNORMAL HIGH (ref 70–99)

## 2012-06-16 LAB — PATHOLOGIST SMEAR REVIEW: Path Review: INCREASED

## 2012-06-16 MED ORDER — DARBEPOETIN ALFA-POLYSORBATE 200 MCG/0.4ML IJ SOLN
200.0000 ug | INTRAMUSCULAR | Status: DC
  Administered 2012-06-17: 200 ug via INTRAVENOUS
  Filled 2012-06-16: qty 0.4

## 2012-06-16 MED ORDER — SODIUM CHLORIDE 0.9 % IV SOLN
500.0000 mg | Freq: Three times a day (TID) | INTRAVENOUS | Status: DC
  Filled 2012-06-16 (×2): qty 10

## 2012-06-16 MED ORDER — LORAZEPAM 2 MG/ML IJ SOLN
1.0000 mg | INTRAMUSCULAR | Status: DC | PRN
  Administered 2012-06-20 (×2): 1 mg via INTRAVENOUS
  Filled 2012-06-16 (×2): qty 1

## 2012-06-16 MED ORDER — SODIUM CHLORIDE 0.9 % IV SOLN
1000.0000 mg | Freq: Once | INTRAVENOUS | Status: AC
  Administered 2012-06-16: 1000 mg via INTRAVENOUS
  Filled 2012-06-16: qty 20

## 2012-06-16 MED ORDER — DARBEPOETIN ALFA-POLYSORBATE 60 MCG/0.3ML IJ SOLN
60.0000 ug | Freq: Once | INTRAMUSCULAR | Status: DC
  Filled 2012-06-16: qty 0.3

## 2012-06-16 MED ORDER — SODIUM CHLORIDE 0.9 % IV SOLN
25.0000 mg | Freq: Once | INTRAVENOUS | Status: AC
  Administered 2012-06-17: 25 mg via INTRAVENOUS
  Filled 2012-06-16 (×2): qty 2

## 2012-06-16 MED ORDER — PHENYTOIN SODIUM 50 MG/ML IJ SOLN
100.0000 mg | Freq: Three times a day (TID) | INTRAMUSCULAR | Status: DC
  Administered 2012-06-16 – 2012-06-20 (×12): 100 mg via INTRAVENOUS
  Filled 2012-06-16 (×18): qty 2

## 2012-06-16 MED ORDER — ONDANSETRON HCL 4 MG/2ML IJ SOLN
INTRAMUSCULAR | Status: AC
  Administered 2012-06-16: 4 mg
  Filled 2012-06-16: qty 2

## 2012-06-16 MED ORDER — SODIUM CHLORIDE 0.9 % IJ SOLN
3.0000 mL | Freq: Two times a day (BID) | INTRAMUSCULAR | Status: DC
  Administered 2012-06-16 – 2012-06-20 (×7): 3 mL via INTRAVENOUS

## 2012-06-16 NOTE — ED Notes (Signed)
Pt arrived via [facility] on LSB and c-collar intact

## 2012-06-16 NOTE — Progress Notes (Signed)
Pt admitted to 3300 from ED. Oriented to unit and room. Safety discussed and call bell with in reach. VSS. Bed alarm set and posey belt applied per md order. Pt constantly rolling around and trying to get out of bed. Wife at bedside. Will continue to monitor.  Elijah Birk, RN

## 2012-06-16 NOTE — Consult Note (Signed)
Reason for Consult:Right subdural fluid, seizure Referring Physician: Dominik, Tracy [name] is an 79 y.o. male.  HPI: whom fell in his bathroom ~0630 this morning. She noted some blood about his head. He was confused at that time. Transported to the hospital at 0900 he had a seizure with uncontrolled movements of his right upper extremity, facial twitching, and decreased responsiveness. He has had multiple falls in the last few months.   Past Medical History  Diagnosis Date  . CAD (coronary artery disease)     status post prior PCI to the obtuse marginal in 1997 and stenting to the mid to distal RCA in 2001; [facility] 9/06:  LM ok, pLAD 50%, mLAD 60% then 60-70%, mRI 50%, mOM1 80-90% (small), pRCA and dRCA stents ok, PDA 50%, EF 65%;  Myoview 5/12: No ischemia, EF 64%.;  NSTEMI 11/12 tx medically   . Chronic kidney disease, stage III (moderate)   . TIA (transient ischemic attack)   . HTN (hypertension)   . Hyperlipidemia   . AF (atrial fibrillation) 02/16/11    Coumadin ==> patient decided to stop after admxn with worsening anemia in setting of UGI bleed, epistaxis  . Arthritis     "in my left ankle"  . Chronic diastolic heart failure   . Carotid stenosis     dopplers 03/2011: 0-39% bilateral  . MDS (myelodysplastic syndrome) 12/16/2011  . Hx of echocardiogram     Echocardiogram 02/17/11: Severe LVH, asymmetric hypertrophy, EF 50-55%, normal wall motion, high ventricular filling pressure, mild LAE, mild RVE, mildly reduced RVSF, mild RAE.  Marland Kitchen Myocardial infarction 2012  . Stroke     5 yrs ago.  Mini - NO RESIDUAL PROBLEMS  . Anemia of chronic disease     RECENT HOSPITALIZATION / BLOOD TRANSFUSION OCT 2013  . Shortness of breath     WITH ANY ACTIVITY---  NO OXYGEN  . CHF (congestive heart failure)     CHRONIC DIASTOLIC HEART FAILURE  . Hydrocele, right   . Hemodialysis patient     Past Surgical History  Procedure Laterality Date  . Nose surgery  1986    deviated septum  . Total  knee arthroplasty  2009    left  . Joint replacement  2009    left knee  . Surgery scrotal / testicular  197O    CORRECTIVE SURGERY TO VARICOCELE (SWELLING OF SCROTUM)  . Esophagogastroduodenoscopy  12/09/2011    Procedure: ESOPHAGOGASTRODUODENOSCOPY (EGD);  Surgeon: Willis Modena, MD;  Location: [facility] ENDOSCOPY;  Service: Endoscopy;  Laterality: N/A;  outlaw/ebp  . Tonsillectomy and adenoidectomy  1929  . Appendectomy  1930's  . Left ear mastoid surgery  1933  . Removal of one testicle  1944  . Correction to botched varicocele surgery  1970  . Eye surgery  1993    SURGERY FOR DETACHED RETINA LEFT EYE  . Left cataract extraction with lens implant  1993  . Coronary angioplasty with stent placement      3 procedures; 3 stents total  1994 & 1996  . Right cataract extraction with lens implant    1997  . Transurethral resection of prostate  01/21/2012    Procedure: TRANSURETHRAL RESECTION OF THE PROSTATE WITH GYRUS INSTRUMENTS;  Surgeon: [name], MD;  Location: [facility] ORS;  Service: Urology;  Laterality: N/A;  . Cystoscopy  01/28/2012    Procedure: CYSTOSCOPY;  Surgeon: [name], MD;  Location: [facility] ORS;  Service: Urology;  Laterality: N/A;  cystoscopy   .  Hematoma evacuation  01/28/2012    Procedure: EVACUATION HEMATOMA;  Surgeon: [name], MD;  Location: [facility] ORS;  Service: Urology;  Laterality: N/A;   with fulgeration evacuation of clot  . Av fistula placement  02/08/2012    Procedure: ARTERIOVENOUS (AV) FISTULA CREATION;  Surgeon: Chuck Hint, MD;  Location: [facility] OR;  Service: Vascular;  Laterality: Left;  . Insertion of dialysis catheter  02/08/2012    Procedure: INSERTION OF DIALYSIS CATHETER;  Surgeon: Chuck Hint, MD;  Location: [facility] OR;  Service: Vascular;  Laterality: Right;  Diatek exchange Right Internal Jugular    No family history on file.  Social History:  reports that he quit smoking about 33 years ago. His smoking use included Cigarettes and Pipe. He  has a 80 pack-year smoking history. He has never used smokeless tobacco. He reports that  drinks alcohol. He reports that he does not use illicit drugs.  Allergies:  Allergies  Allergen Reactions  . Crestor (Rosuvastatin) Swelling  . Oxycodone Hcl Nausea Only and Other (See Comments)    Wanted to climb the wall and him dizzy  . Prednisone Other (See Comments)    Gain weight     Medications: I have reviewed the patient's current medications.  Results for orders placed during the hospital encounter of 06/15/2012 (from the past 48 hour(s))  CBC WITH DIFFERENTIAL     Status: Abnormal (Preliminary result)   Collection Time    06/13/2012  8:25 AM      Result Value Range   WBC 45.5 (*) 4.0 - 10.5 K/uL   Comment: WHITE COUNT CONFIRMED ON SMEAR   RBC 2.39 (*) 4.22 - 5.81 MIL/uL   Hemoglobin 7.5 (*) 13.0 - 17.0 g/dL   HCT 16.1 (*) 09.6 - 04.5 %   MCV 104.6 (*) 78.0 - 100.0 fL   MCH 31.4  26.0 - 34.0 pg   MCHC 30.0  30.0 - 36.0 g/dL   RDW 40.9 (*) 81.1 - 91.4 %   Platelets 82 (*) 150 - 400 K/uL   Comment: PLATELET COUNT CONFIRMED BY SMEAR   Neutrophils Relative PENDING  43 - 77 %   Neutro Abs PENDING  1.7 - 7.7 K/uL   Band Neutrophils PENDING  0 - 10 %   Lymphocytes Relative PENDING  12 - 46 %   Lymphs Abs PENDING  0.7 - 4.0 K/uL   Monocytes Relative PENDING  3 - 12 %   Monocytes Absolute PENDING  0.1 - 1.0 K/uL   Eosinophils Relative PENDING  0 - 5 %   Eosinophils Absolute PENDING  0.0 - 0.7 K/uL   Basophils Relative PENDING  0 - 1 %   Basophils Absolute PENDING  0.0 - 0.1 K/uL   WBC Morphology PENDING     RBC Morphology PENDING     Smear Review PENDING     nRBC PENDING  0 /100 WBC   Metamyelocytes Relative PENDING     Myelocytes PENDING     Promyelocytes Absolute PENDING     Blasts PENDING    BASIC METABOLIC PANEL     Status: Abnormal   Collection Time    06/26/2012  8:25 AM      Result Value Range   Sodium 136  135 - 145 mEq/L   Potassium 4.6  3.5 - 5.1 mEq/L   Chloride 95  (*) 96 - 112 mEq/L   CO2 22  19 - 32 mEq/L   Glucose, Bld 100 (*) 70 - 99 mg/dL  BUN 24 (*) 6 - 23 mg/dL   Creatinine, Ser 0.27 (*) 0.50 - 1.35 mg/dL   Calcium 9.2  8.4 - 25.3 mg/dL   GFR calc non Af Amer 9 (*) >90 mL/min   GFR calc Af Amer 11 (*) >90 mL/min   Comment:            The eGFR has been calculated     using the CKD EPI equation.     This calculation has not been     validated in all clinical     situations.     eGFR's persistently     <90 mL/min signify     possible Chronic Kidney Disease.    Ct Head Wo Contrast  25-Jun-2012  *RADIOLOGY REPORT*  Clinical Data:  .  Fall.  CT HEAD WITHOUT CONTRAST CT CERVICAL SPINE WITHOUT CONTRAST  Technique:  Multidetector CT imaging of the head and cervical spine was performed following the standard protocol without intravenous contrast.  Multiplanar CT image reconstructions of the cervical spine were also generated.  Comparison:  04/23/2012.  CT HEAD  Findings: Motion degraded exam.  New from the prior CT is a broad-based right hemispheric subdural hematoma of mixed density measuring up to 9 mm.  Local mass effect without to midline shift.  No obvious skull fracture.  Small vessel disease type changes without CT evidence of large acute infarct.  No intracranial mass lesion detected on this unenhanced exam.  Vascular calcifications.  Prior left mastoidectomy with partial opacification in the mastoidectomy site as previously noted.  Opacification left sphenoid sinus with mild mucosal thickening/partial opacification ethmoid sinus air cells.  IMPRESSION: New from the prior CT is a broad-based right hemispheric subdural hematoma of mixed density measuring up to 9 mm.  Local mass effect without to midline shift.  Please see above.  CT CERVICAL SPINE  Findings: Motion degraded exam.  Cervical spine fracture cannot be excluded on present exam.  Follow-up imaging when the patient is better able to cooperate recommended.  Rotation of the head and C1 ring on  C2.  Prominent carotid calcifications.  IMPRESSION: Motion degraded exam.  Cervical spine fracture cannot be excluded on present exam.  Follow-up imaging when the patient is better able to cooperate recommended.  Rotation of the head and C1 ring on C2.  Critical Value/emergent results were called by telephone at the time of interpretation on 2012-06-25 at 9:40 a.m. to Dr. Effie Shy, who verbally acknowledged these results.   Original Report Authenticated By: Lacy Duverney, M.D.    Ct Cervical Spine Wo Contrast  Jun 25, 2012  *RADIOLOGY REPORT*  Clinical Data:  .  Fall.  CT HEAD WITHOUT CONTRAST CT CERVICAL SPINE WITHOUT CONTRAST  Technique:  Multidetector CT imaging of the head and cervical spine was performed following the standard protocol without intravenous contrast.  Multiplanar CT image reconstructions of the cervical spine were also generated.  Comparison:  04/23/2012.  CT HEAD  Findings: Motion degraded exam.  New from the prior CT is a broad-based right hemispheric subdural hematoma of mixed density measuring up to 9 mm.  Local mass effect without to midline shift.  No obvious skull fracture.  Small vessel disease type changes without CT evidence of large acute infarct.  No intracranial mass lesion detected on this unenhanced exam.  Vascular calcifications.  Prior left mastoidectomy with partial opacification in the mastoidectomy site as previously noted.  Opacification left sphenoid sinus with mild mucosal thickening/partial opacification ethmoid sinus air cells.  IMPRESSION: New from the  prior CT is a broad-based right hemispheric subdural hematoma of mixed density measuring up to 9 mm.  Local mass effect without to midline shift.  Please see above.  CT CERVICAL SPINE  Findings: Motion degraded exam.  Cervical spine fracture cannot be excluded on present exam.  Follow-up imaging when the patient is better able to cooperate recommended.  Rotation of the head and C1 ring on C2.  Prominent carotid  calcifications.  IMPRESSION: Motion degraded exam.  Cervical spine fracture cannot be excluded on present exam.  Follow-up imaging when the patient is better able to cooperate recommended.  Rotation of the head and C1 ring on C2.  Critical Value/emergent results were called by telephone at the time of interpretation on 06/22/2012 at 9:40 a.m. to Dr. Effie Shy, who verbally acknowledged these results.   Original Report Authenticated By: Lacy Duverney, M.D.     ROS Blood pressure 111/51, pulse 110, temperature 97.1 F (36.2 C), temperature source Oral, resp. rate 19, SpO2 97.00%. Physical Exam  Vitals reviewed. Constitutional: He appears well-developed and well-nourished.  HENT:  Right Ear: External ear normal.  Left Ear: External ear normal.  Mouth/Throat: Oropharynx is clear and moist.  Ecchymoses about head  Neck:  In cervical collar  Cardiovascular: Normal heart sounds.   Respiratory: Effort normal and breath sounds normal. No stridor.  GI: Soft. Bowel sounds are normal.  Musculoskeletal:       Right knee: He exhibits swelling and ecchymosis.       Left knee: He exhibits swelling, ecchymosis and bony tenderness.  Abrasion to dorsum of left hand.   Neurological: He is alert. No cranial nerve deficit. He exhibits normal muscle tone. Coordination normal.  Confused, oriented to person, place and situation. Follows commands Moving all extremities, weak in both triceps.    Psychiatric: He has a normal mood and affect.    Assessment/Plan: No need for surgical intervention. The fluid is isodense and did not happen today. He has had multiple falls and more than likely the previous falls are responsible for the small subdural fluid that he has. No skull fractures. Needs flexion and extension films of the cspine when able. Would place on anticonvulsants at this time.   Avrohom Mckelvin L 06/15/2012, 10:11 AM

## 2012-06-16 NOTE — ED Notes (Signed)
Called to room by family pt with full body seizures which resolved after approx 1 minute. Placed on oxygen 2L [place]. Dr. Effie Shy at bedside during seizure.  Pt now post ictal, not speaking. VSS. C-collar remains in place

## 2012-06-16 NOTE — ED Notes (Signed)
Patient transported to X-ray 

## 2012-06-16 NOTE — Consult Note (Signed)
[facility] Renal Consultation Note  Indication for Consultation:  Management of ESRD/hemodialysis; anemia, hypertension/volume and secondary hyperparathyroidism  HPI: Tracy [name] is a 79 y.o. male with ESRD on dialysis on MWF at the [facility] who presented to the ED this morning after a fall in his bathroom at home at 6:30 AM.  He was found with contusions on his right cheek and his left knee and was placed in full spinal immobilization for transport.  However, shortly after his arrival he had a generalized seizure with decreased responsiveness, which was treated with IV Dilantin.  CT of the head today showed broad-based right hemispheric subdural hematoma of mixed density up to 9 mm.  However, the patient was unable to cooperative, so CT of the cervical spine could not exclude fracture.  Neurosurgery saw the patient in the ED and determined no surgical intervention at this time.  He fell last week with his walker onto the ice and injured his right hip and the dorsal surface of his left hand, for which he was seen at the Urgent [facility] on [address] and received PO antibiotics.  He followed up with his primary care physician on 3/15 at [facility], where his hand wound was cleaned, dressed, and required additional antibiotics.  This is his regularly scheduled day for dialysis, but he is very agitated, intermittently lucid, and with some slurred speech and may not cooperate with treatment today.   Dialysis Orders: Center: [place] on MWF. EDW 69.5 kg  HD Bath 3K/2.25Ca  Time 4hrs  Heparin 0. Access AVF @ LFA   BFR 400 DFR A1.5   Hectorol 2 mcg IV/HD  Epogen 28,000 Units IV/HD  Venofer 0.   Past Medical History  Diagnosis Date  . CAD (coronary artery disease)     status post prior PCI to the obtuse marginal in 1997 and stenting to the mid to distal RCA in 2001; LHC 9/06:  LM ok, pLAD 50%, mLAD 60% then 60-70%, mRI 50%, mOM1 80-90% (small), pRCA and dRCA  stents ok, PDA 50%, EF 65%;  Myoview 5/12: No ischemia, EF 64%.;  NSTEMI 11/12 tx medically   . Chronic kidney disease, stage III (moderate)   . TIA (transient ischemic attack)   . HTN (hypertension)   . Hyperlipidemia   . AF (atrial fibrillation) 02/16/11    Coumadin ==> patient decided to stop after admxn with worsening anemia in setting of UGI bleed, epistaxis  . Arthritis     "in my left ankle"  . Chronic diastolic heart failure   . Carotid stenosis     dopplers 03/2011: 0-39% bilateral  . MDS (myelodysplastic syndrome) 12/16/2011  . Hx of echocardiogram     Echocardiogram 02/17/11: Severe LVH, asymmetric hypertrophy, EF 50-55%, normal wall motion, high ventricular filling pressure, mild LAE, mild RVE, mildly reduced RVSF, mild RAE.  Marland Kitchen Myocardial infarction 2012  . Stroke     5 yrs ago.  Mini - NO RESIDUAL PROBLEMS  . Anemia of chronic disease     RECENT HOSPITALIZATION / BLOOD TRANSFUSION OCT 2013  . Shortness of breath     WITH ANY ACTIVITY---  NO OXYGEN  . CHF (congestive heart failure)     CHRONIC DIASTOLIC HEART FAILURE  . Hydrocele, right   . Hemodialysis patient    Past Surgical History  Procedure Laterality Date  . Nose surgery  1986    deviated septum  . Total knee arthroplasty  2009    left  . Joint  replacement  2009    left knee  . Surgery scrotal / testicular  197O    CORRECTIVE SURGERY TO VARICOCELE (SWELLING OF SCROTUM)  . Esophagogastroduodenoscopy  12/09/2011    Procedure: ESOPHAGOGASTRODUODENOSCOPY (EGD);  Surgeon: Willis Modena, MD;  Location: [facility] ENDOSCOPY;  Service: Endoscopy;  Laterality: N/A;  outlaw/ebp  . Tonsillectomy and adenoidectomy  1929  . Appendectomy  1930's  . Left ear mastoid surgery  1933  . Removal of one testicle  1944  . Correction to botched varicocele surgery  1970  . Eye surgery  1993    SURGERY FOR DETACHED RETINA LEFT EYE  . Left cataract extraction with lens implant  1993  . Coronary angioplasty with stent placement      3  procedures; 3 stents total  1994 & 1996  . Right cataract extraction with lens implant    1997  . Transurethral resection of prostate  01/21/2012    Procedure: TRANSURETHRAL RESECTION OF THE PROSTATE WITH GYRUS INSTRUMENTS;  Surgeon: [name], MD;  Location: [facility] ORS;  Service: Urology;  Laterality: N/A;  . Cystoscopy  01/28/2012    Procedure: CYSTOSCOPY;  Surgeon: [name], MD;  Location: [facility] ORS;  Service: Urology;  Laterality: N/A;  cystoscopy   . Hematoma evacuation  01/28/2012    Procedure: EVACUATION HEMATOMA;  Surgeon: [name], MD;  Location: [facility] ORS;  Service: Urology;  Laterality: N/A;   with fulgeration evacuation of clot  . Av fistula placement  02/08/2012    Procedure: ARTERIOVENOUS (AV) FISTULA CREATION;  Surgeon: Chuck Hint, MD;  Location: [facility] OR;  Service: Vascular;  Laterality: Left;  . Insertion of dialysis catheter  02/08/2012    Procedure: INSERTION OF DIALYSIS CATHETER;  Surgeon: Chuck Hint, MD;  Location: [facility] OR;  Service: Vascular;  Laterality: Right;  Diatek exchange Right Internal Jugular   No family history on file.  Social History He quit tobacco about 33 years ago after smoking two packs of cigarettes a day and a pipe.  He continues to drink alcohol, specifically single malt scotch and Guinness, but denies any history of illicit drugs.  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  He is currently married and lives at home.    Allergies  Allergen Reactions  . Crestor (Rosuvastatin) Swelling  . Oxycodone Hcl Nausea Only and Other (See Comments)    Wanted to climb the wall and him dizzy  . Prednisone Other (See Comments)    Gain weight    Prior to Admission medications   Medication Sig Start Date End Date Taking? Authorizing Provider  acetaminophen (TYLENOL) 500 MG tablet Take 500 mg by mouth every 6 (six) hours as needed. For pain   Yes Historical  Provider, MD  doxercalciferol (HECTOROL) 4 MCG/2ML injection Inject 2 mcg into the vein every Monday, Wednesday, and Friday with hemodialysis.   Yes Historical Provider, MD  doxycycline (VIBRA-TABS) 100 MG tablet Take 100 mg by mouth 2 (two) times daily. Filled 3/15 for 10 days   Yes Historical Provider, MD  epoetin alfa (EPOGEN,PROCRIT) 14782 UNIT/ML injection Inject 1.4 Units into the skin every Monday, Wednesday, and Friday. 28,000 units   Yes Historical Provider, MD  Iron Sucrose (VENOFER IV) Inject 50 mg into the vein every 7 (seven) days. Wednesday   Yes Historical Provider, MD  nitroGLYCERIN (NITROSTAT) 0.4 MG SL tablet Place 0.4 mg under the tongue every 5 (five)  minutes as needed. For chest pain    Historical Provider, MD   Labs:  Results for orders placed during the hospital encounter of 06/07/2012 (from the past 48 hour(s))  CBC WITH DIFFERENTIAL     Status: Abnormal   Collection Time    06/11/2012  8:25 AM      Result Value Range   WBC 45.5 (*) 4.0 - 10.5 K/uL   Comment: WHITE COUNT CONFIRMED ON SMEAR   RBC 2.39 (*) 4.22 - 5.81 MIL/uL   Hemoglobin 7.5 (*) 13.0 - 17.0 g/dL   HCT 16.1 (*) 09.6 - 04.5 %   MCV 104.6 (*) 78.0 - 100.0 fL   MCH 31.4  26.0 - 34.0 pg   MCHC 30.0  30.0 - 36.0 g/dL   RDW 40.9 (*) 81.1 - 91.4 %   Platelets 82 (*) 150 - 400 K/uL   Comment: PLATELET COUNT CONFIRMED BY SMEAR   Neutrophils Relative 5 (*) 43 - 77 %   Lymphocytes Relative 9 (*) 12 - 46 %   Monocytes Relative 82 (*) 3 - 12 %   Eosinophils Relative 1  0 - 5 %   Basophils Relative 1  0 - 1 %   Band Neutrophils 0  0 - 10 %   Metamyelocytes Relative 1     Myelocytes 1     Promyelocytes Absolute 0     Blasts 0     nRBC 0  0 /100 WBC   Neutro Abs 3.2  1.7 - 7.7 K/uL   Lymphs Abs 4.1 (*) 0.7 - 4.0 K/uL   Monocytes Absolute 37.2 (*) 0.1 - 1.0 K/uL   Eosinophils Absolute 0.5  0.0 - 0.7 K/uL   Basophils Absolute 0.5 (*) 0.0 - 0.1 K/uL   RBC Morphology POLYCHROMASIA PRESENT     Comment: RARE NRBCs    WBC Morphology ATYPICAL LYMPHOCYTES     Comment: ATYPICAL MONONUCLEAR CELLS  BASIC METABOLIC PANEL     Status: Abnormal   Collection Time    06/20/2012  8:25 AM      Result Value Range   Sodium 136  135 - 145 mEq/L   Potassium 4.6  3.5 - 5.1 mEq/L   Chloride 95 (*) 96 - 112 mEq/L   CO2 22  19 - 32 mEq/L   Glucose, Bld 100 (*) 70 - 99 mg/dL   BUN 24 (*) 6 - 23 mg/dL   Creatinine, Ser 7.82 (*) 0.50 - 1.35 mg/dL   Calcium 9.2  8.4 - 95.6 mg/dL   GFR calc non Af Amer 9 (*) >90 mL/min   GFR calc Af Amer 11 (*) >90 mL/min   Comment:            The eGFR has been calculated     using the CKD EPI equation.     This calculation has not been     validated in all clinical     situations.     eGFR's persistently     <90 mL/min signify     possible Chronic Kidney Disease.  PATHOLOGIST SMEAR REVIEW     Status: None   Collection Time    05/29/2012  8:25 AM      Result Value Range   Path Review INCREASED CELL CONSISTENT WITH     Comment: MONOCYTES MOST OF WHICH ARE     MATURE BUT FEW IMMATURE AND     HAVE NUCLEOLI AND PROBABLE MONOBLASTS.     MARKED ANEMIA AND NRBC,  MODERATE THROMBOCYTOPENIA.     SUGGEST HEMATOLOGY CONSULT INCLUDING FLOW.     Reviewed by Coralyn Pear, M.D.     06/15/12.   Review of Systems:  Unable to attain secondary to patient circumstances  Physical Exam: Filed Vitals:   06/06/2012 1254  BP: 103/83  Pulse: 104  Temp: 97.9 F (36.6 C)  Resp: 19     General appearance: Periods of lucidity with intermittent drowsiness, agitated and restless Head: Right cheek contusion, normocephalic Neck: no adenopathy, no carotid bruit, no JVD and supple, symmetrical, trachea midline Resp: clear to auscultation bilaterally Cardio: regular rate and rhythm, S1, S2 normal, no murmur, click, rub or gallop GI: soft, non-tender; bowel sounds normal; no masses,  no organomegaly Extremities: No edema, abrasions on both knees (L > R) Neurologic: Moves limbs bilaterally without  deficit Dialysis Access: AVF @ LFA with + bruit   Assessment/Plan: 1. Head trauma - secondary to fall; right subdural hematoma per CT, unable to rule out cervical fx; no surgical intervention at this time per Neurosurgery, but repeat CT of cervical spine and x-ray of knees tomorrow. 2. Seizure activity - likely secondary to #1; remains on IV Dilantin.  3. ESRD -  HD on MWF @ [facility]; K 4.6.  HD likely tomorrow. 4. Hypertension/volume  - BP 103/83, generally low, but on outpatient Metoprolol 12.5 mg bid; wt 69.7 kg with EDW 69.5.  5. Anemia - Hgb 7.5, often low on outpatient Epogen 28,000 U.  Aranesp 200 mcg with next HD.  6. Metabolic bone disease -  Ca 9.2, last P 4.1 on 2/26, iPTH 239 on 1/22; no binders. 7. Nutrition - last Alb 3.2, renal vitamin. 8. Hx Atrial fib - on Metoprolol, not a candidate for Coumadin. 9. Hx thrombocytopenia - Plts 82K, no Heparin with HD.  Gerome Apley 06/22/2012, 1:24 PM   Attending Nephrologist: Casimiro Needle, MD  As above, agree with assessment and plans as articulated.  Pt had fall with resultant head trauma and SDH.  Awakens and answers appropriately at the current time.  Plan for no heparin dialysis tomorrow.  BP stable. Madonna Flegal C

## 2012-06-16 NOTE — ED Notes (Signed)
Patient transported to CT 

## 2012-06-16 NOTE — ED Notes (Signed)
Pt from home, wife reports fell last night at 0100, awoke this morning at 0700 pt fell again & found on bathroom floor not acting himself. Pt c/o back pain & has multiple skin tears to both hands.  Is a dialysis pt & is due for dialysis today.

## 2012-06-16 NOTE — ED Notes (Signed)
Admitting physician at bedside
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
## 2012-06-16 NOTE — ED Provider Notes (Signed)
History     CSN: [id number]  Arrival date & time June 21, 2012  4098   First MD Initiated Contact with Patient 06/24/[id number]      Chief Complaint  Patient presents with  . Fall  . Back Pain    (Consider location/radiation/quality/duration/timing/severity/associated sxs/prior treatment) HPI Comments: Tracy [name] is a 79 y.o. Male who was at home, when his wife heard him fall in the bathroom. He injured his face and right hand in the fall. When she got to him he was alert, EMS was called, and they transferred him here with full spinal immobilization protocol. The patient was due for dialysis today. He is unable to give additional history.   Level V Caveat- altered mental status  Patient is a 79 y.o. male presenting with fall and back pain. The history is provided by the patient and the spouse.  Fall  Back Pain   Past Medical History  Diagnosis Date  . CAD (coronary artery disease)     status post prior PCI to the obtuse marginal in 1997 and stenting to the mid to distal RCA in 2001; LHC 9/06:  LM ok, pLAD 50%, mLAD 60% then 60-70%, mRI 50%, mOM1 80-90% (small), pRCA and dRCA stents ok, PDA 50%, EF 65%;  Myoview 5/12: No ischemia, EF 64%.;  NSTEMI 11/12 tx medically   . Chronic kidney disease, stage III (moderate)   . TIA (transient ischemic attack)   . HTN (hypertension)   . Hyperlipidemia   . AF (atrial fibrillation) 02/16/11    Coumadin ==> patient decided to stop after admxn with worsening anemia in setting of UGI bleed, epistaxis  . Arthritis     "in my left ankle"  . Chronic diastolic heart failure   . Carotid stenosis     dopplers 03/2011: 0-39% bilateral  . MDS (myelodysplastic syndrome) 12/16/2011  . Hx of echocardiogram     Echocardiogram 02/17/11: Severe LVH, asymmetric hypertrophy, EF 50-55%, normal wall motion, high ventricular filling pressure, mild LAE, mild RVE, mildly reduced RVSF, mild RAE.  Marland Kitchen Myocardial infarction 2012  . Stroke     5 yrs ago.  Mini - NO  RESIDUAL PROBLEMS  . Anemia of chronic disease     RECENT HOSPITALIZATION / BLOOD TRANSFUSION OCT 2013  . Shortness of breath     WITH ANY ACTIVITY---  NO OXYGEN  . CHF (congestive heart failure)     CHRONIC DIASTOLIC HEART FAILURE  . Hydrocele, right   . Hemodialysis patient     Past Surgical History  Procedure Laterality Date  . Nose surgery  1986    deviated septum  . Total knee arthroplasty  2009    left  . Joint replacement  2009    left knee  . Surgery scrotal / testicular  197O    CORRECTIVE SURGERY TO VARICOCELE (SWELLING OF SCROTUM)  . Esophagogastroduodenoscopy  12/09/2011    Procedure: ESOPHAGOGASTRODUODENOSCOPY (EGD);  Surgeon: Willis Modena, MD;  Location: [facility] ENDOSCOPY;  Service: Endoscopy;  Laterality: N/A;  outlaw/ebp  . Tonsillectomy and adenoidectomy  1929  . Appendectomy  1930's  . Left ear mastoid surgery  1933  . Removal of one testicle  1944  . Correction to botched varicocele surgery  1970  . Eye surgery  1993    SURGERY FOR DETACHED RETINA LEFT EYE  . Left cataract extraction with lens implant  1993  . Coronary angioplasty with stent placement      3 procedures; 3 stents total  1994 & 1996  .  Right cataract extraction with lens implant    1997  . Transurethral resection of prostate  01/21/2012    Procedure: TRANSURETHRAL RESECTION OF THE PROSTATE WITH GYRUS INSTRUMENTS;  Surgeon: [name], MD;  Location: [facility] ORS;  Service: Urology;  Laterality: N/A;  . Cystoscopy  01/28/2012    Procedure: CYSTOSCOPY;  Surgeon: [name], MD;  Location: [facility] ORS;  Service: Urology;  Laterality: N/A;  cystoscopy   . Hematoma evacuation  01/28/2012    Procedure: EVACUATION HEMATOMA;  Surgeon: [name], MD;  Location: [facility] ORS;  Service: Urology;  Laterality: N/A;   with fulgeration evacuation of clot  . Av fistula placement  02/08/2012    Procedure: ARTERIOVENOUS (AV) FISTULA CREATION;  Surgeon: Chuck Hint, MD;  Location: [facility] OR;  Service: Vascular;   Laterality: Left;  . Insertion of dialysis catheter  02/08/2012    Procedure: INSERTION OF DIALYSIS CATHETER;  Surgeon: Chuck Hint, MD;  Location: [facility] OR;  Service: Vascular;  Laterality: Right;  Diatek exchange Right Internal Jugular    No family history on file.  History  Substance Use Topics  . Smoking status: Former Smoker -- 2.00 packs/day for 40 years    Types: Cigarettes, Pipe    Quit date: 08/24/1978  . Smokeless tobacco: Never Used     Comment: 1980 quit smoking cigarrettes and pipe  . Alcohol Use: Yes     Comment: "glass of wine or beer once in awhile"      Review of Systems  Musculoskeletal: Positive for back pain.  All other systems reviewed and are negative.    Allergies  Crestor; Oxycodone hcl; and Prednisone  Home Medications   Current Outpatient Rx  Name  Route  Sig  Dispense  Refill  . acetaminophen (TYLENOL) 500 MG tablet   Oral   Take 500 mg by mouth every 6 (six) hours as needed. For pain         . doxercalciferol (HECTOROL) 4 MCG/2ML injection   Intravenous   Inject 2 mcg into the vein every Monday, Wednesday, and Friday with hemodialysis.         Marland Kitchen doxycycline (VIBRA-TABS) 100 MG tablet   Oral   Take 100 mg by mouth 2 (two) times daily. Filled 3/15 for 10 days         . epoetin alfa (EPOGEN,PROCRIT) 13086 UNIT/ML injection   Subcutaneous   Inject 1.4 Units into the skin every Monday, Wednesday, and Friday. 28,000 units         . Iron Sucrose (VENOFER IV)   Intravenous   Inject 50 mg into the vein every 7 (seven) days. Wednesday         . nitroGLYCERIN (NITROSTAT) 0.4 MG SL tablet   Sublingual   Place 0.4 mg under the tongue every 5 (five) minutes as needed. For chest pain           BP 101/85  Pulse 109  Temp(Src) 97.1 F (36.2 C) (Oral)  Resp 18  SpO2 99%  Physical Exam  Nursing note and vitals reviewed. Constitutional: He appears well-developed.  Frail, elderly. He is slipping off the spine board, to the  left.  HENT:  Head: Normocephalic and atraumatic.  Right Ear: External ear normal.  Left Ear: External ear normal.  Eyes: Conjunctivae and EOM are normal. Pupils are equal, round, and reactive to light.  There is bleeding from the nares, left, but no nasal deformity. No evident lip or dental injury.  Neck: Phonation  normal.  The cervical collar left in place, until imaging  Cardiovascular: Normal rate, regular rhythm, normal heart sounds and intact distal pulses.   Pulmonary/Chest: Effort normal and breath sounds normal. He exhibits no bony tenderness.  Abdominal: Soft. Normal appearance. There is no tenderness.  Musculoskeletal: Normal range of motion.  Abrasion dorsum of left hand. He moves arms and legs, independently, and voluntarily. There is no evident strength deficits. There is no thoracic or lumbar spine tenderness, or deformity.  Neurological: He is alert. He has normal strength. No cranial nerve deficit or sensory deficit. He exhibits normal muscle tone. Coordination normal.  He follows simple commands, but does not answer questions  Skin: Skin is warm, dry and intact.  Psychiatric: His behavior is normal.    ED Course  Procedures (including critical care time) He was removed from the back-board with assistance of nursing staff; cervical collar left in place.  08:55Called to room- patient having a seizure- he has not been scaned yet I arrived to the room. The patient had right gaze with right directed nystagmus, facial twitching to the right, the left arm rhythmically twitching. His right arm was tucked behind his head in a position of repose. The patient had decreased responsiveness. The seizure abated in about 1 minute. Following the seizure. He had snoring respirations. He had transient sinus bradycardia at that recovered to normal sinus rhythm, within 60 seconds. He appeared to have some apnea during the seizure. The pulse oxygenation did not radiate as he came out of the  seizure. The wife was in the room and states the patient has never had a seizure. There is no indication for medication for seizure, at this time.  Additional treatments. Patient was placed on nasal cannula oxygen  09:40- Disussed with Radiologist, Pt has a right brain Subdural. IV Dilantin ordered to prevent further SZ. Wife states he is DNR. She confirms that he has been off warfarin for one to 2 months.  Plain films in the radiology department, could not be done, because of agitation  10:01- Case discussed with Neurosurgery, Dr. Franky Macho. He will see the pt. In the ED  Nursing Notes Reviewed/ Care Coordinated Applicable Imaging Reviewed. Radiologic imaging report reviewed and images by CT  - viewed, by me. Interpretation of Laboratory Data incorporated into ED treatment    Date: 01/14/2012  Rate: 114  Rhythm: atrial fibrillation  QRS Axis: normal  PR and QT Intervals: QT prolonged  ST/T Wave abnormalities: early repolarization  PR and QRS Conduction Disutrbances:QTc prolonged  Narrative Interpretation:   Old EKG Reviewed: unchanged    CRITICAL CARE Performed byMancel Bale L   Total critical care time: 50 minutes  Critical care time was exclusive of separately billable procedures and treating other patients.  Critical care was necessary to treat or prevent imminent or life-threatening deterioration.  Critical care was time spent personally by me on the following activities: development of treatment plan with patient and/or surrogate as well as nursing, discussions with consultants, evaluation of patient's response to treatment, examination of patient, obtaining history from patient or surrogate, ordering and performing treatments and interventions, ordering and review of laboratory studies, ordering and review of radiographic studies, pulse oximetry and re-evaluation of patient's condition.  Labs Reviewed  CBC WITH DIFFERENTIAL - Abnormal; Notable for the following:     WBC 45.5 (*)    RBC 2.39 (*)    Hemoglobin 7.5 (*)    HCT 25.0 (*)    MCV 104.6 (*)  RDW 23.2 (*)    Platelets 82 (*)    Neutrophils Relative 5 (*)    Lymphocytes Relative 9 (*)    Monocytes Relative 82 (*)    Lymphs Abs 4.1 (*)    Monocytes Absolute 37.2 (*)    Basophils Absolute 0.5 (*)    All other components within normal limits  BASIC METABOLIC PANEL - Abnormal; Notable for the following:    Chloride 95 (*)    Glucose, Bld 100 (*)    BUN 24 (*)    Creatinine, Ser 4.99 (*)    GFR calc non Af Amer 9 (*)    GFR calc Af Amer 11 (*)    All other components within normal limits  URINE CULTURE  PATHOLOGIST SMEAR REVIEW  URINALYSIS, ROUTINE W REFLEX MICROSCOPIC   Ct Head Wo Contrast  06/20/2012  *RADIOLOGY REPORT*  Clinical Data:  .  Fall.  CT HEAD WITHOUT CONTRAST CT CERVICAL SPINE WITHOUT CONTRAST  Technique:  Multidetector CT imaging of the head and cervical spine was performed following the standard protocol without intravenous contrast.  Multiplanar CT image reconstructions of the cervical spine were also generated.  Comparison:  04/23/2012.  CT HEAD  Findings: Motion degraded exam.  New from the prior CT is a broad-based right hemispheric subdural hematoma of mixed density measuring up to 9 mm.  Local mass effect without to midline shift.  No obvious skull fracture.  Small vessel disease type changes without CT evidence of large acute infarct.  No intracranial mass lesion detected on this unenhanced exam.  Vascular calcifications.  Prior left mastoidectomy with partial opacification in the mastoidectomy site as previously noted.  Opacification left sphenoid sinus with mild mucosal thickening/partial opacification ethmoid sinus air cells.  IMPRESSION: New from the prior CT is a broad-based right hemispheric subdural hematoma of mixed density measuring up to 9 mm.  Local mass effect without to midline shift.  Please see above.  CT CERVICAL SPINE  Findings: Motion degraded exam.   Cervical spine fracture cannot be excluded on present exam.  Follow-up imaging when the patient is better able to cooperate recommended.  Rotation of the head and C1 ring on C2.  Prominent carotid calcifications.  IMPRESSION: Motion degraded exam.  Cervical spine fracture cannot be excluded on present exam.  Follow-up imaging when the patient is better able to cooperate recommended.  Rotation of the head and C1 ring on C2.  Critical Value/emergent results were called by telephone at the time of interpretation on 06/02/2012 at 9:40 a.m. to Dr. Effie Shy, who verbally acknowledged these results.   Original Report Authenticated By: Lacy Duverney, M.D.    Ct Cervical Spine Wo Contrast  06/13/2012  *RADIOLOGY REPORT*  Clinical Data:  .  Fall.  CT HEAD WITHOUT CONTRAST CT CERVICAL SPINE WITHOUT CONTRAST  Technique:  Multidetector CT imaging of the head and cervical spine was performed following the standard protocol without intravenous contrast.  Multiplanar CT image reconstructions of the cervical spine were also generated.  Comparison:  04/23/2012.  CT HEAD  Findings: Motion degraded exam.  New from the prior CT is a broad-based right hemispheric subdural hematoma of mixed density measuring up to 9 mm.  Local mass effect without to midline shift.  No obvious skull fracture.  Small vessel disease type changes without CT evidence of large acute infarct.  No intracranial mass lesion detected on this unenhanced exam.  Vascular calcifications.  Prior left mastoidectomy with partial opacification in the mastoidectomy site as previously noted.  Opacification left sphenoid sinus with mild mucosal thickening/partial opacification ethmoid sinus air cells.  IMPRESSION: New from the prior CT is a broad-based right hemispheric subdural hematoma of mixed density measuring up to 9 mm.  Local mass effect without to midline shift.  Please see above.  CT CERVICAL SPINE  Findings: Motion degraded exam.  Cervical spine fracture cannot be  excluded on present exam.  Follow-up imaging when the patient is better able to cooperate recommended.  Rotation of the head and C1 ring on C2.  Prominent carotid calcifications.  IMPRESSION: Motion degraded exam.  Cervical spine fracture cannot be excluded on present exam.  Follow-up imaging when the patient is better able to cooperate recommended.  Rotation of the head and C1 ring on C2.  Critical Value/emergent results were called by telephone at the time of interpretation on Jun 19, 2012 at 9:40 a.m. to Dr. Effie Shy, who verbally acknowledged these results.   Original Report Authenticated By: Lacy Duverney, M.D.      1. Subdural hematoma   2. Hemodialysis patient   3. Seizure   4. Abrasion, hand, left, initial encounter   5. Fall, initial encounter       MDM  Subdural hematoma, mixed density; age determination is inconclusive. Patient does not need urgent neurosurgical intervention. He needs to be admitted for observation, stabilization. There is no indication for urgent hemodialysis. White blood cell elevation, etiology unclear, there is no overt sign for infection.     Plan: Admit    Flint Melter, MD 19-Jun-2012 [phone]

## 2012-06-16 NOTE — Progress Notes (Signed)
Utilization review completed.  

## 2012-06-16 NOTE — ED Notes (Addendum)
Attempted to in & out cath, no urine returned. When taking cath out, blood came out urethra. MD made aware.

## 2012-06-16 NOTE — ED Notes (Signed)
Pt taken off LSB by Effie Shy, MD with c-collar still intact; pt placed in gown, on monitor, continuous pulse oximetry and blood pressure cuff; wife at bedside

## 2012-06-16 NOTE — ED Notes (Signed)
Pt has history of frequent falls, noted to have multiple bruises in various stages of healing to extremities. Normally ambulates with a walker/cane but none was found near pt when he fell.  Wife reports pt also fell early this morning at 0100 (denied injury) but noticed he had slurred speech then. When he fell at 0700 noted bruise to right side head, dried blood to nose, to be very confused & slurred speech continued.  Pt oriented to person, place & time but unaware of events that brought him here.  Wife at bedside & states that now pt not as confused as earlier. Pt denies neck or back pain. Moves all extremities well.

## 2012-06-17 ENCOUNTER — Inpatient Hospital Stay (HOSPITAL_COMMUNITY): Payer: Medicare Other

## 2012-06-17 DIAGNOSIS — Z862 Personal history of diseases of the blood and blood-forming organs and certain disorders involving the immune mechanism: Secondary | ICD-10-CM

## 2012-06-17 DIAGNOSIS — D72829 Elevated white blood cell count, unspecified: Secondary | ICD-10-CM

## 2012-06-17 LAB — RENAL FUNCTION PANEL
BUN: 34 mg/dL — ABNORMAL HIGH (ref 6–23)
CO2: 12 mEq/L — ABNORMAL LOW (ref 19–32)
Calcium: 9 mg/dL (ref 8.4–10.5)
Chloride: 92 mEq/L — ABNORMAL LOW (ref 96–112)
Creatinine, Ser: 6.34 mg/dL — ABNORMAL HIGH (ref 0.50–1.35)
Phosphorus: 7.5 mg/dL — ABNORMAL HIGH (ref 2.3–4.6)
Sodium: 135 mEq/L (ref 135–145)

## 2012-06-17 LAB — CBC
HCT: 22.2 % — ABNORMAL LOW (ref 39.0–52.0)
MCV: 105.7 fL — ABNORMAL HIGH (ref 78.0–100.0)
RDW: 24.1 % — ABNORMAL HIGH (ref 11.5–15.5)
WBC: 74.9 10*3/uL (ref 4.0–10.5)

## 2012-06-17 MED ORDER — ACETAMINOPHEN 325 MG PO TABS: 650.0000 mg | ORAL_TABLET | Freq: Four times a day (QID) | ORAL | Status: DC | PRN

## 2012-06-17 MED ORDER — ALTEPLASE 2 MG IJ SOLR
2.0000 mg | Freq: Once | INTRAMUSCULAR | Status: DC | PRN
  Filled 2012-06-17: qty 2

## 2012-06-17 MED ORDER — PENTAFLUOROPROP-TETRAFLUOROETH EX AERO: 1.0000 "application " | INHALATION_SPRAY | CUTANEOUS | Status: DC | PRN

## 2012-06-17 MED ORDER — LIDOCAINE HCL (PF) 1 % IJ SOLN: 5.0000 mL | INTRAMUSCULAR | Status: DC | PRN

## 2012-06-17 MED ORDER — NEPRO/CARBSTEADY PO LIQD
237.0000 mL | ORAL | Status: DC | PRN
  Filled 2012-06-17: qty 237

## 2012-06-17 MED ORDER — PIPERACILLIN-TAZOBACTAM IN DEX 2-0.25 GM/50ML IV SOLN
2.2500 g | Freq: Three times a day (TID) | INTRAVENOUS | Status: DC
  Administered 2012-06-17 – 2012-06-21 (×12): 2.25 g via INTRAVENOUS
  Filled 2012-06-17 (×16): qty 50

## 2012-06-17 MED ORDER — SODIUM CHLORIDE 0.9 % IV SOLN: 100.0000 mL | INTRAVENOUS | Status: DC | PRN

## 2012-06-17 MED ORDER — DARBEPOETIN ALFA-POLYSORBATE 200 MCG/0.4ML IJ SOLN
INTRAMUSCULAR | Status: AC
  Filled 2012-06-17: qty 0.4

## 2012-06-17 MED ORDER — SODIUM CHLORIDE 0.9 % IV SOLN: 750.0000 mg | INTRAVENOUS | Status: DC

## 2012-06-17 MED ORDER — HEPARIN SODIUM (PORCINE) 1000 UNIT/ML DIALYSIS: 1000.0000 [IU] | INTRAMUSCULAR | Status: DC | PRN

## 2012-06-17 MED ORDER — VANCOMYCIN HCL 10 G IV SOLR
1500.0000 mg | Freq: Once | INTRAVENOUS | Status: AC
  Administered 2012-06-17: 1500 mg via INTRAVENOUS
  Filled 2012-06-17: qty 1500

## 2012-06-17 MED ORDER — LIDOCAINE-PRILOCAINE 2.5-2.5 % EX CREA
1.0000 "application " | TOPICAL_CREAM | CUTANEOUS | Status: DC | PRN
  Filled 2012-06-17: qty 5

## 2012-06-17 NOTE — Progress Notes (Signed)
PROGRESS NOTE  Tracy [name] JXB:[id number] DOB: Apr 30, 1919 DOA: 06/14/2012 PCP: [name], MD  Brief narrative: 79 yr old male with multiple medical illnesses presented with fall abnd subdural hematoma on 05/29/2012.  He was rousable initally but very confused and agitated.  Ct scan showed a stable bleed, but patient had a seizure in the Ed prompting a load of dilantin.  Patient seen by NS, and thoguht not to need operative interventions  Past medical history-As per Problem list Chart reviewed as below- Chart review  Ed evaluation for falls 1.26.14  D/c from Inpatient rehab 11.27.13 for deconsditioning-Gross hematuria post-TURP  Admission for BPH s/p TURP 10.25.13  Initiation of HD sometime in 01/2012  Admission 9.27.13 for ABLA-thought to Have UGIB + hemetemesis-coumadin stopped at that point  Admission 12/03/11 for Hypercalcemia-thought to be dietary related  Endscopy by Dr. Dulce Sellar 12/03/11 showed mild gatritis  Admission 11/30/05 with R post Cerebral Infarct  CAD s/p PTCA to RCA, distal RCA and post desc branch RCA as well  Consultants:  Nephrology  Procedures:  CT head 3/21-Motion degraded exam. Cervical spine fracture cannot be excluded on present exam. Follow-up imaging when the patient is better able to cooperate recommended. Rotation of the head and C1 ring on C2. Critical Value/emergent results were called by telephone at the time of interpretation on 06/18/2012 at 9:40 a.m. to Dr. Effie Shy, who verbally acknowledged these results.  CT head repeat 3/22 1. Negative for fracture or other acute bony abnormality. 2. Multilevel degenerative changes as enumerated above. 3. Loss of the normal cervical spine lordosis, which may be secondary to positioning, spasm, or soft tissue injury. 4. Bilateral carotid bifurcation plaque.   Ct neck-NAD-maybe some protrusion C4-5,C5-6, C6-7  CXR pending   Antibiotics:  Vancomycin 3.22.14   Subjective  More alert.  Can relate some history.  Denies  any pain at bedside but wife states he has complained of pain all over most of the day Abotu to go to dialysis   Objective    Interim History:  nad   Telemetry: NSR  Objective: Filed Vitals:   06/17/12 0213 06/17/12 0800 06/17/12 1130 06/17/12 1131  BP: 125/90 97/62 115/91   Pulse:  99 92   Temp:  98.4 F (36.9 C)  98.3 F (36.8 C)  TempSrc:  Axillary  Oral  Resp: 26 21 22    Height:      Weight:      SpO2:  99% 98%     Intake/Output Summary (Last 24 hours) at 06/17/12 1202 Last data filed at 06/17/12 0740  Gross per 24 hour  Intake      6 ml  Output      1 ml  Net      5 ml    Exam:  General: Sleepy, awakens but not oriented mumbles. Eyes: Pupils about 3 mm bilaterally no nystagmus cannot assess otherwise ENT: Soft supple no tenderness to spine no thyromegaly or JVP Neck: C. above Cardiovascular: S1-S2 no murmur rub or gallop Respiratory: Clinically clear   Data Reviewed: Basic Metabolic Panel:  Recent Labs Lab 06/13/2012 0825 06/17/12 1210  NA 136 135  K 4.6 5.8*  CL 95* 92*  CO2 22 12*  GLUCOSE 100* 143*  BUN 24* 34*  CREATININE 4.99* 6.34*  CALCIUM 9.2 9.0  PHOS  --  7.5*   Liver Function Tests: No results found for this basename: AST, ALT, ALKPHOS, BILITOT, PROT, ALBUMIN,  in the last 168 hours No results found for this basename: LIPASE,  AMYLASE,  in the last 168 hours No results found for this basename: AMMONIA,  in the last 168 hours CBC:  Recent Labs Lab June 18, 2012 0825 06/17/12 1210  WBC 45.5* 74.9*  NEUTROABS 3.2  --   HGB 7.5* 6.6*  HCT 25.0* 22.2*  MCV 104.6* 105.7*  PLT 82* 83*   Cardiac Enzymes: No results found for this basename: CKTOTAL, CKMB, CKMBINDEX, TROPONINI,  in the last 168 hours BNP: No components found with this basename: POCBNP,  CBG: No results found for this basename: GLUCAP,  in the last 168 hours  Recent Results (from the past 240 hour(s))  MRSA PCR SCREENING     Status: None   Collection Time    18-Jun-2012  12:55 PM      Result Value Range Status   MRSA by PCR NEGATIVE  NEGATIVE Final   Comment:            The GeneXpert MRSA Assay (FDA     approved for NASAL specimens     only), is one component of a     comprehensive MRSA colonization     surveillance program. It is not     intended to diagnose MRSA     infection nor to guide or     monitor treatment for     MRSA infections.     Studies:              All Imaging reviewed and is as per above notation   Scheduled Meds: . darbepoetin (ARANESP) injection - DIALYSIS  200 mcg Intravenous Q Sat-HD  . ferric gluconate (FERRLECIT/NULECIT) Test Dose  25 mg Intravenous Once  . phenytoin (DILANTIN) IV  100 mg Intravenous Q8H  . sodium chloride  3 mL Intravenous Q12H   Continuous Infusions:    Assessment/Plan: 1. Syncopal episode resulting in subdural bleed-neurosurgery has been consulted-at this time no surgical intervention is recommended. Repeat CT of head didn't show any progression of subdural.  Appreciate NS input 2. Fall-patient had repeat Limb x-rays and Cervical spine x-rays 06/17/12 showing no acute fracture.  Symptomatic management 3. Seizure activity-probably related to irritation from subdural hemorrhage-continue Phenytoin 100 tid 4. Elevated white cell count-DDx-Infectious but probably acute Myeloid Leukemia-consulted Dr. Arline Asp of Oncology for further recommendations.  Patient has documented refusal of anti-neoplastic medications per Wife after last hospital visit in the past-he likely has a porr prognosis regardless 5. Atrial fibrillation with elevated [place] score plus bradycardia-none any rate controlling medications-not a Coumadin candidate given falls 6. Pancytopenia + mild dysplastic syndrome-see above Continue iron and erythrogenic factor-His Hb dropped to 6.6-he will be transfused 2 U PRBC's 7. End-stage renal disease-have alerted nephrology to patient being care. Per Nephrology 8. Cardiomyopathy plus history CAD-not  candidate for aspirin at present time. Further management as per outpatient cardiology once discharge-stable-EKG does not show any acute changes  Code Status: DNR Family Communication: discussed and updated wife in detail Disposition Plan: inpatient-tele   Pleas Koch, MD  [facility] Pager [phone] 06/17/2012, 12:02 PM    LOS: 1 day

## 2012-06-17 NOTE — Progress Notes (Signed)
Patient ID: [name], male   DOB: [birth year], 79 y.o.   MRN: [id number] Impatient impulsive and agitated with cervical collar.  Repeat CT scan shows no change in small subdural hematoma and right frontal region.  C-spine shows advanced spondylitic disease at multiple levels worst being C4-5 C5-6 and C6-C7. No evidence of acute fracture.  Okay to remove soft cervical collar. Treat conservatively.  Discuss situation with wife.

## 2012-06-17 NOTE — Progress Notes (Signed)
ANTIBIOTIC CONSULT NOTE - INITIAL  Pharmacy Consult for Vancomycin and Zosyn Indication: pneumonia  Allergies  Allergen Reactions  . Crestor (Rosuvastatin) Swelling  . Oxycodone Hcl Nausea Only and Other (See Comments)    Wanted to climb the wall and him dizzy  . Prednisone Other (See Comments)    Gain weight     Patient Measurements: Height: 6\' 1"  (185.4 cm) Weight: 150 lb 2.1 oz (68.1 kg) IBW/kg (Calculated) : 79.9   Vital Signs: Temp: 97.6 F (36.4 C) (03/22 1158) Temp src: Oral (03/22 1158) BP: 77/37 mmHg (03/22 1400) Pulse Rate: 94 (03/22 1400) Intake/Output from previous day: 03/21 0701 - 03/22 0700 In: 6 [I.V.:6] Out: -  Intake/Output from this shift: Total I/O In: -  Out: 1 [Stool:1]  Labs:  Recent Labs  06/15/2012 0825 06/17/12 1210  WBC 45.5* 74.9*  HGB 7.5* 6.6*  PLT 82* 83*  CREATININE 4.99* 6.34*   Estimated Creatinine Clearance: 7.5 ml/min (by C-G formula based on Cr of 6.34). No results found for this basename: VANCOTROUGH, Leodis Binet, VANCORANDOM, GENTTROUGH, GENTPEAK, GENTRANDOM, TOBRATROUGH, TOBRAPEAK, TOBRARND, AMIKACINPEAK, AMIKACINTROU, AMIKACIN,  in the last 72 hours   Microbiology: Recent Results (from the past 720 hour(s))  MRSA PCR SCREENING     Status: None   Collection Time    06/05/2012 12:55 PM      Result Value Range Status   MRSA by PCR NEGATIVE  NEGATIVE Final   Comment:            The GeneXpert MRSA Assay (FDA     approved for NASAL specimens     only), is one component of a     comprehensive MRSA colonization     surveillance program. It is not     intended to diagnose MRSA     infection nor to guide or     monitor treatment for     MRSA infections.    Medical History: Past Medical History  Diagnosis Date  . CAD (coronary artery disease)     status post prior PCI to the obtuse marginal in 1997 and stenting to the mid to distal RCA in 2001; LHC 9/06:  LM ok, pLAD 50%, mLAD 60% then 60-70%, mRI 50%, mOM1 80-90%  (small), pRCA and dRCA stents ok, PDA 50%, EF 65%;  Myoview 5/12: No ischemia, EF 64%.;  NSTEMI 11/12 tx medically   . Chronic kidney disease, stage III (moderate)   . TIA (transient ischemic attack)   . HTN (hypertension)   . Hyperlipidemia   . AF (atrial fibrillation) 02/16/11    Coumadin ==> patient decided to stop after admxn with worsening anemia in setting of UGI bleed, epistaxis  . Arthritis     "in my left ankle"  . Chronic diastolic heart failure   . Carotid stenosis     dopplers 03/2011: 0-39% bilateral  . MDS (myelodysplastic syndrome) 12/16/2011  . Hx of echocardiogram     Echocardiogram 02/17/11: Severe LVH, asymmetric hypertrophy, EF 50-55%, normal wall motion, high ventricular filling pressure, mild LAE, mild RVE, mildly reduced RVSF, mild RAE.  Marland Kitchen Myocardial infarction 2012  . Stroke     5 yrs ago.  Mini - NO RESIDUAL PROBLEMS  . Anemia of chronic disease     RECENT HOSPITALIZATION / BLOOD TRANSFUSION OCT 2013  . Shortness of breath     WITH ANY ACTIVITY---  NO OXYGEN  . CHF (congestive heart failure)     CHRONIC DIASTOLIC HEART FAILURE  . Hydrocele, right   .  Hemodialysis patient     Medications:  Prescriptions prior to admission  Medication Sig Dispense Refill  . acetaminophen (TYLENOL) 500 MG tablet Take 500 mg by mouth every 6 (six) hours as needed. For pain      . doxercalciferol (HECTOROL) 4 MCG/2ML injection Inject 2 mcg into the vein every Monday, Wednesday, and Friday with hemodialysis.      Marland Kitchen doxycycline (VIBRA-TABS) 100 MG tablet Take 100 mg by mouth 2 (two) times daily. Filled 3/15 for 10 days      . epoetin alfa (EPOGEN,PROCRIT) 16109 UNIT/ML injection Inject 1.4 Units into the skin every Monday, Wednesday, and Friday. 28,000 units      . Iron Sucrose (VENOFER IV) Inject 50 mg into the vein every 7 (seven) days. Wednesday      . nitroGLYCERIN (NITROSTAT) 0.4 MG SL tablet Place 0.4 mg under the tongue every 5 (five) minutes as needed. For chest pain        Assessment: 79 yo M admitted 2012-07-06  With fall and SDH Pharmacy consulted 3/22 to start Vancomycin and Zosyn.  WBC elevated 74.9, HD (TTS), afebrile, Blood and urine cultures pending  Goal of Therapy:  Vancomycin trough level 15-20 mcg/ml  Plan:  1. Vancomycin 1500 mg IV x 1 then 750 mg q HD TTS 2. Zosyn 2.25 g IV q8h 3. Follow up SCr, UOP, cultures, clinical course and adjust as clinically indicated.    Thank you for allowing pharmacy to be a part of this patients care team.  Lovenia Kim Pharm.D., BCPS Clinical Pharmacist 06/17/2012 2:20 PM Pager: [phone] Phone: [phone]

## 2012-06-17 NOTE — Progress Notes (Signed)
06/17/2012 patient transfer from 3300, was in hemodialysis and then came to 6700 at 1651. Patient is alert, oriented to person place and have some confusion. Patient is risk for fall, because he's been falling prior to admission. Patient have bruises on arms, knees, on the left side on face, scab on left knee, on fingers on hands, wrist. Patient is also weak. [facility] RN.

## 2012-06-17 NOTE — Progress Notes (Signed)
PT Cancellation Note  Patient Details Name: Tracy [name] MRN: [id number] DOB: 1919/07/19   Cancelled Treatment:    Reason Eval/Treat Not Completed: Patient at procedure or test/unavailable; initiated eval with pt/wife and dialysis staff came to transport him to dialysis.  Will see pt to complete evaluation tomorrow.   Myracle Febres,CYNDI 06/17/2012, 11:56 AM

## 2012-06-17 NOTE — Progress Notes (Signed)
Subjective: No complaints, on HD  Objective Vital signs in last 24 hours: Filed Vitals:   06/17/12 1245 06/17/12 1300 06/17/12 1315 06/17/12 1325  BP: 76/36 86/51 114/77 71/35  Pulse: 103 71 102 96  Temp:      TempSrc:      Resp: 15 22 26    Height:      Weight:      SpO2: 97%      Weight change:   Intake/Output Summary (Last 24 hours) at 06/17/12 1332 Last data filed at 06/17/12 0740  Gross per 24 hour  Intake      6 ml  Output      1 ml  Net      5 ml   Labs: Basic Metabolic Panel:  Recent Labs Lab 2012/07/08 0825 06/17/12 1210  NA 136 135  K 4.6 5.8*  CL 95* 92*  CO2 22 12*  GLUCOSE 100* 143*  BUN 24* 34*  CREATININE 4.99* 6.34*  CALCIUM 9.2 9.0  PHOS  --  7.5*   Liver Function Tests:  Recent Labs Lab 06/17/12 1210  ALBUMIN 2.6*   No results found for this basename: LIPASE, AMYLASE,  in the last 168 hours No results found for this basename: AMMONIA,  in the last 168 hours CBC:  Recent Labs Lab July 08, 2012 0825 06/17/12 1210  WBC 45.5* 74.9*  NEUTROABS 3.2  --   HGB 7.5* 6.6*  HCT 25.0* 22.2*  MCV 104.6* 105.7*  PLT 82* 83*   PT/INR: @LABRCNTIP (inr:5)   Scheduled Meds ) . darbepoetin (ARANESP) injection - DIALYSIS  200 mcg Intravenous Q Sat-HD  . ferric gluconate (FERRLECIT/NULECIT) Test Dose  25 mg Intravenous Once  . phenytoin (DILANTIN) IV  100 mg Intravenous Q8H  . sodium chloride  3 mL Intravenous Q12H    Physical Exam:  Blood pressure 71/35, pulse 96, temperature 97.6 F (36.4 C), temperature source Oral, resp. rate 26, height 6\' 1"  (1.854 m), weight 68.1 kg (150 lb 2.1 oz), SpO2 97.00%. Gen: resting on HD, no complaints Head: Right cheek contusion, normocephalic  Neck: no adenopathy, no carotid bruit, no JVD and supple, symmetrical, trachea midline  Resp: clear to auscultation bilaterally  Cardio: regular rate and rhythm, S1, S2 normal, no murmur, click, rub or gallop  GI: soft, non-tender; bowel sounds normal; no masses, no  organomegaly  Extremities: No edema, abrasions on both knees (L > R)  Neurologic: Moves limbs bilaterally without deficit, speaking without difficulty, making sense Dialysis Access: AVF @ LFA with + bruit    Dialysis Orders: Center: [place] on MWF.  EDW 69.5 kg HD Bath 3K/2.25Ca Time 4hrs Heparin 0. Access AVF @ LFA BFR 400 DFR A1.5 Hectorol 2 mcg IV/HD Epogen 28,000 Units IV/HD Venofer 0.   Assessment/Plan:  1. Head trauma - secondary to fall; right subdural hematoma per CT, unable to rule out cervical fx; no surgical intervention at this time per Neurosurgery, but repeat CT of cervical spine and x-ray of knees tomorrow. 2. Seizure activity - likely secondary to #1; remains on IV Dilantin.  3. ESRD - HD on MWF @ [facility]; K 4.6. HD today then Monday to get back on schedule 4. Hypertension/volume - BP 103/83, generally low, but on outpatient Metoprolol 12.5 mg bid; wt 69.7 kg with EDW 69.5.  5. Anemia - Hgb 7.5, often low on outpatient Epogen 28,000 U. Aranesp 200 mcg with next HD.  6. Metabolic bone disease - Ca 9.2, last P 4.1 on 2/26, iPTH 239 on 1/22; no  binders. 7. Nutrition - last Alb 3.2, renal vitamin. 8. Hx Atrial fib - on Metoprolol, not a candidate for Coumadin. 9. Hx thrombocytopenia - Plts 82K, no Heparin with HD.   Tracy Moselle  MD [phone] pgr    [phone] cell 06/17/2012, 1:32 PM

## 2012-06-17 NOTE — Progress Notes (Signed)
Hospital consult note dictated  [phone].   Inspection of the peripheral blood smear shows a predominance of atypical monocytes most suggestive of chronic myelomonocytic leukemia, a subtype of myelodysplastic syndrome.  An occasional blast was noted but I do not believe the patient has acute leukemia.  Even if he were to develop acute leukemia, there is no beneficial treatment for him in view of his age, other medical problems and pre-existing MDS.  I have had a discussion with the patient's wife Arvilla Meres, son Reuel Boom and daughter Jaynie Crumble. Recommend continue monitoring of blood counts with transfusional support as indicated along with Aranesp which he apparently receives with his hemodialysis.  If his WBC continues to rise and patient were to develop signs and symptoms of hyperviscosity syndrome, we could consider treatment with hydroxyurea to bring the WBC down.  If patient does turn out to have C. Diff, that infection in the setting of CMML could explain the rather dramatic jump of the WBC.  Agree with DNR status.  Feel free to call me if you wish to discuss.   Samul Dada 06/17/2012 8:18 PM

## 2012-06-18 LAB — TYPE AND SCREEN
Antibody Screen: NEGATIVE
Unit division: 0

## 2012-06-18 LAB — CBC WITH DIFFERENTIAL/PLATELET
Blasts: 0 %
Eosinophils Absolute: 0.5 10*3/uL (ref 0.0–0.7)
Eosinophils Relative: 1 % (ref 0–5)
Lymphocytes Relative: 11 % — ABNORMAL LOW (ref 12–46)
MCV: 102.7 fL — ABNORMAL HIGH (ref 78.0–100.0)
Metamyelocytes Relative: 0 %
Monocytes Absolute: 41.3 10*3/uL — ABNORMAL HIGH (ref 0.1–1.0)
Monocytes Relative: 78 % — ABNORMAL HIGH (ref 3–12)
Neutro Abs: 4.8 10*3/uL (ref 1.7–7.7)
Neutrophils Relative %: 9 % — ABNORMAL LOW (ref 43–77)
Platelets: 72 10*3/uL — ABNORMAL LOW (ref 150–400)
RBC: 2.55 MIL/uL — ABNORMAL LOW (ref 4.22–5.81)
RDW: 23.9 % — ABNORMAL HIGH (ref 11.5–15.5)
WBC: 52.9 10*3/uL (ref 4.0–10.5)
nRBC: 0 /100 WBC

## 2012-06-18 LAB — PROTIME-INR
INR: 2.08 — ABNORMAL HIGH (ref 0.00–1.49)
Prothrombin Time: 22.5 seconds — ABNORMAL HIGH (ref 11.6–15.2)

## 2012-06-18 LAB — SAVE SMEAR

## 2012-06-18 LAB — VITAMIN B12: Vitamin B-12: >2000 pg/mL — ABNORMAL HIGH (ref 211–911)

## 2012-06-18 LAB — CLOSTRIDIUM DIFFICILE BY PCR: Toxigenic C. Difficile by PCR: NEGATIVE

## 2012-06-18 LAB — APTT: aPTT: 50 seconds — ABNORMAL HIGH (ref 24–37)

## 2012-06-18 MED ORDER — SODIUM CHLORIDE 0.9 % IV BOLUS (SEPSIS)
500.0000 mL | Freq: Once | INTRAVENOUS | Status: AC
  Administered 2012-06-18: 500 mL via INTRAVENOUS

## 2012-06-18 MED ORDER — SODIUM CHLORIDE 0.9 % IV SOLN
INTRAVENOUS | Status: DC
  Administered 2012-06-18: 10:00:00 via INTRAVENOUS

## 2012-06-18 MED ORDER — MIDODRINE HCL 5 MG PO TABS
10.0000 mg | ORAL_TABLET | Freq: Three times a day (TID) | ORAL | Status: DC
  Administered 2012-06-18 – 2012-06-20 (×7): 10 mg via ORAL
  Filled 2012-06-18 (×12): qty 2

## 2012-06-18 NOTE — Progress Notes (Signed)
[facility] hospital[facility] progress note. Chief complaint. Followup post fall. History of present illness. This 79 year old male admitted with the subdural hematoma on 06/26/2012. This was a result of a fall prior to admission. Last a CT scan of the head noted right frontal and parietal subdural hematoma with minimal midline shift, no herniation or hydrocephalus. Per nursing report patient was being helped to the bathroom earlier this shift and fell onto the toilet striking his head on the sink. I requested a repeat CT scan of the head to reassess for further/worsening bleeding and this has now resulted indicating a new focal 8 mm convex left frontal parietal extra-axial hematoma without overlying fracture. This probably represents a focal subdural hematoma with small epidural hemorrhage is difficult to exclude given shape. Unchanged 9 mm right frontal parietal subdural collection with a stable 2 mm right to left midline shift. Given the change in the left parietal area I contacted Doctor Cabbell and neurosurgery to review these findings. The patient himself is alert and cooperative. He denies a headache, visual changes, speech changes, or a decrease in his level of consciousness. Vital signs. Temperature 96.9, pulse 103, respiration 22, blood pressure 83/41. O2 sats 90%. General appearance. Frail appearing elderly male is alert, cooperative, and in no distress. Cardiac. Rate and rhythm regular. Lungs. Breath sounds clinical. Abdomen. Soft with positive bowel sounds. No pain. Neurologic. Cranial nerves II through XII grossly intact. No unilateral or focal defects. Impression/plan. Problem #1. Probable new left brain hematoma. Post fall in the bathroom with the head strike on the sink CT noting a new focal 8 mm left frontal parietal extra-axial hematoma that probably represents a focal subdural hematoma. I reviewed these findings with neurosurgery Doctor Cabbell who felt that since patient's neuro exam was  unremarkable that no further action is required at this time. I will defer further radiologic followup to daytime neurosurgery and rounding physician.

## 2012-06-18 NOTE — Progress Notes (Signed)
Subjective:  Lethargic, receiving 500-cc bolus of NS due to low pressure, new subdural hematoma after syncopal episode and fall in bathroom last night.   Objective: Vital signs in last 24 hours: Temp:  [96.8 F (36 C)-98.3 F (36.8 C)] 97.3 F (36.3 C) (03/23 0620) Pulse Rate:  [71-113] 100 (03/23 0620) Resp:  [15-26] 19 (03/23 0620) BP: (64-115)/(35-91) 81/40 mmHg (03/23 0620) SpO2:  [81 %-98 %] 92 % (03/23 0620) Weight:  [68.1 kg (150 lb 2.1 oz)-70.8 kg (156 lb 1.4 oz)] 70.8 kg (156 lb 1.4 oz) (03/22 1621) Weight change: -1.6 kg (-3 lb 8.4 oz)  Intake/Output from previous day: 03/22 0701 - 03/23 0700 In: 600 [Blood:600] Out: -1281 [Stool:1] Intake/Output this shift: Total I/O In: 360 [P.O.:360] Out: -  EXAM: General appearance:  Lethargic, able to arouse briefly Resp:  CTA without rales, rhonchi, or wheezes Cardio:  Tachycardic, no murmur or rub GI:  + BS, soft and nontender Extremities:  No edema Access:  AVF @ LFA with + bruit  Lab Results:  Recent Labs  06/17/12 1210 06/18/12 0425  WBC 74.9* 52.9*  HGB 6.6* 8.0*  HCT 22.2* 26.2*  PLT 83* 72*   BMET:  Recent Labs  06/22/2012 0825 06/17/12 1210  NA 136 135  K 4.6 5.8*  CL 95* 92*  CO2 22 12*  GLUCOSE 100* 143*  BUN 24* 34*  CREATININE 4.99* 6.34*  CALCIUM 9.2 9.0  ALBUMIN  --  2.6*   No results found for this basename: PTH,  in the last 72 hours Iron Studies: No results found for this basename: IRON, TIBC, TRANSFERRIN, FERRITIN,  in the last 72 hours  Dialysis Orders: Center: [place] on MWF.  EDW 69.5 kg HD Bath 3K/2.25Ca Time 4hrs Heparin 0. Access AVF @ LFA BFR 400 DFR A1.5 Hectorol 2 mcg IV/HD Epogen 28,000 Units IV/HD Venofer 0.   Assessment/Plan: 1. Head trauma - 9 mm right frontoparietal subdural hematoma per CT, secondary to fall 3/21 AM, and 8 mm left frontoparietal subdural hematoma, secondary to fall last night. 2. Seizure activity - likely secondary to #1; remains on IV Dilantin started 3  days ago, no further occurrence.  3. Leukocytosis - WBCs up to 74.9 yesterday, 52.9 today; BCs pending, on Zosyn and Vancomycin; Hx myelodysplastic syndrome, bone marrow biopsy 06/17/11 by Dr. [name]; seen by Dr. Arline Asp yesterday, more consistent with CML, recommends conservative Tx for now, follow blood counts.  4. ESRD - HD on MWF @ [facility]; K 5.8 pre-HD yesterday. HD tomorrow to get back on schedule. 5. Hypertension/volume - BP 81/40, received 500 ml NS today; wt 70.8 kg s/p net UF (-1.3 L) yesterday secondary to low BP; with EDW 69.5.  6. Anemia - Hgb 8 s/p 2 U PRBCs yesterday for Hgb 6.6, often low on outpatient Epogen 28,000 U, s/p Aranesp 200 mcg yesterday.  7. Metabolic bone disease - Ca 9 (09.8 corrected), P 7.5, iPTH 239 on 1/22; no binders. 8. Nutrition - last Alb 2.6, renal vitamin 9. Hx Atrial fib - on Metoprolol, not a candidate for Coumadin. 10. Hx thrombocytopenia - Plts 72K, no Heparin with HD.     LOS: 2 days   Tracy [name],Tracy [name] 06/18/2012,9:53 AM  Patient seen and examined.  Agree with assessment and plan as above. Blood pressure a little more stable today, rec'd fluid boluses yesterday due to hypotension in HD. He is right at his dry weight. WBC dramatic jump is slightly better today, no signs of blast crisis per oncology. Cdif sample  sent this am and is pending. Rec's as above Tracy Moselle  MD [phone] pgr    [phone] cell 06/18/2012, 12:55 PM

## 2012-06-18 NOTE — Progress Notes (Signed)
PROGRESS NOTE  Tracy [name] ZOX:[id number] DOB: 1920-02-23 DOA: 06/19/12 PCP: [name], MD  Brief narrative: 79 yr old male with multiple medical illnesses presented with fall abnd subdural hematoma on June 19, 2012.  He was rousable initally but very confused and agitated.  Ct scan showed a stable bleed, but patient had a seizure in the Ed prompting a load of dilantin.  Patient seen by NS, and thoguht not to need intervention. Patient had a white count of 72,000 which promted along with hypotension and some epiosdes of diarrhea, which prompted search for a specific cause.  Oncology was consulted and a septic work-up was undertaken.  He has remained somewhat hypotensive and had another episode of fall and syncopy as well on the night of 3.22.14 with a new subdural hematoma.   Past medical history-As per Problem list Chart reviewed as below- Chart review  Ed evaluation for falls 1.26.14  D/c from Inpatient rehab 11.27.13 for deconsditioning-Gross hematuria post-TURP  Admission for BPH s/p TURP 10.25.13  Initiation of HD sometime in 01/2012  Admission 9.27.13 for ABLA-thought to Have UGIB + hemetemesis-coumadin stopped at that point  Admission 12/03/11 for Hypercalcemia-thought to be dietary related  Endscopy by Dr. Dulce Sellar 12/03/11 showed mild gatritis  Admission 11/30/05 with R post Cerebral Infarct  CAD s/p PTCA to RCA, distal RCA and post desc branch RCA as well  Consultants:  Nephrology  Procedures:  CT head 3/21-Motion degraded exam. Cervical spine fracture cannot be excluded on present exam. Follow-up imaging when the patient is better able to cooperate recommended. Rotation of the head and C1 ring on C2. Critical Value/emergent results were called by telephone at the time of interpretation on 2012-06-19 at 9:40 a.m. to Dr. Effie Shy, who verbally acknowledged these results.  CT head repeat 3/22 1. Negative for fracture or other acute bony abnormality. 2. Multilevel degenerative changes as  enumerated above. 3. Loss of the normal cervical spine lordosis, which may be secondary to positioning, spasm, or soft tissue injury. 4. Bilateral carotid bifurcation plaque.   Ct neck-NAD-maybe some protrusion C4-5,C5-6, C6-7  Hand and Knee x-ray 3.22 showed no acute fractures  CXR 3.22=Improved puolm edema atelectasis  CT head repeat 3.22 showed new small subdural    Antibiotics:  Vancomycin 3.22.14   Subjective  More alert.  Events from last night noted.  Patient denies specific pain or SOB, and orients to time place and person and wants coffee.  Patient is on clear liquid diet for some reason   Objective    Interim History:  nad   Telemetry: NSR  Objective: Filed Vitals:   06/17/12 2046 06/17/12 2120 06/17/12 2220 06/18/12 0620  BP: 64/36 84/53 83/41  81/40  Pulse: 113 113 103 100  Temp:   96.9 F (36.1 C) 97.3 F (36.3 C)  TempSrc:   Axillary Oral  Resp:   22 19  Height:      Weight:      SpO2:  97% 90% 92%    Intake/Output Summary (Last 24 hours) at 06/18/12 0809 Last data filed at 06/17/12 1621  Gross per 24 hour  Intake    600 ml  Output  -1282 ml  Net   1882 ml    Exam:  General: Sleepy, awakens and more oreinted Eyes: Pupils about 3 mm bilaterallye  ENT: Soft supple no tenderness to spine no thyromegaly or JVP-R temporal area shows new bruise Neck: C. above  Cardiovascular: S1-S2 no murmur rub or gallop  Respiratory: Clinically clear   Data Reviewed:  Basic Metabolic Panel:  Recent Labs Lab 06/15/2012 0825 06/17/12 1210  NA 136 135  K 4.6 5.8*  CL 95* 92*  CO2 22 12*  GLUCOSE 100* 143*  BUN 24* 34*  CREATININE 4.99* 6.34*  CALCIUM 9.2 9.0  PHOS  --  7.5*   Liver Function Tests:  Recent Labs Lab 06/17/12 1210  ALBUMIN 2.6*   No results found for this basename: LIPASE, AMYLASE,  in the last 168 hours No results found for this basename: AMMONIA,  in the last 168 hours CBC:  Recent Labs Lab 06/10/2012 0825 06/17/12 1210  06/18/12 0425  WBC 45.5* 74.9* 52.9*  NEUTROABS 3.2  --  4.8  HGB 7.5* 6.6* 8.0*  HCT 25.0* 22.2* 26.2*  MCV 104.6* 105.7* 102.7*  PLT 82* 83* 72*   Cardiac Enzymes: No results found for this basename: CKTOTAL, CKMB, CKMBINDEX, TROPONINI,  in the last 168 hours BNP: No components found with this basename: POCBNP,  CBG: No results found for this basename: GLUCAP,  in the last 168 hours  Recent Results (from the past 240 hour(s))  MRSA PCR SCREENING     Status: None   Collection Time    06/07/2012 12:55 PM      Result Value Range Status   MRSA by PCR NEGATIVE  NEGATIVE Final   Comment:            The GeneXpert MRSA Assay (FDA     approved for NASAL specimens     only), is one component of a     comprehensive MRSA colonization     surveillance program. It is not     intended to diagnose MRSA     infection nor to guide or     monitor treatment for     MRSA infections.  CULTURE, BLOOD (ROUTINE X 2)     Status: None   Collection Time    06/17/12  1:55 PM      Result Value Range Status   Specimen Description BLOOD AVF   Final   Special Requests BOTTLES DRAWN AEROBIC AND ANAEROBIC 10CC   Final   Culture  Setup Time 06/17/2012 21:26   Final   Culture     Final   Value:        BLOOD CULTURE RECEIVED NO GROWTH TO DATE CULTURE WILL BE HELD FOR 5 DAYS BEFORE ISSUING A FINAL NEGATIVE REPORT   Report Status PENDING   Incomplete  CULTURE, BLOOD (ROUTINE X 2)     Status: None   Collection Time    06/17/12  2:10 PM      Result Value Range Status   Specimen Description BLOOD AVF   Final   Special Requests BOTTLES DRAWN AEROBIC AND ANAEROBIC 10CC   Final   Culture  Setup Time 06/17/2012 21:26   Final   Culture     Final   Value:        BLOOD CULTURE RECEIVED NO GROWTH TO DATE CULTURE WILL BE HELD FOR 5 DAYS BEFORE ISSUING A FINAL NEGATIVE REPORT   Report Status PENDING   Incomplete     Studies:              All Imaging reviewed and is as per above notation   Scheduled Meds: .  darbepoetin (ARANESP) injection - DIALYSIS  200 mcg Intravenous Q Sat-HD  . phenytoin (DILANTIN) IV  100 mg Intravenous Q8H  . piperacillin-tazobactam (ZOSYN)  IV  2.25 g Intravenous Q8H  . sodium chloride  3 mL Intravenous Q12H  . [START ON 06/20/2012] vancomycin  750 mg Intravenous Q T,Th,S,Su-1800   Continuous Infusions:    Assessment/Plan: 1. Syncopal episode resulting in multiple subdural bleed-neurosurgery has been consulted-at this time no surgical intervention is recommended. Patient had a further fall on the night of 3.22 resulting in yet another subdural hematoma, probably from orthostasis 2. Fall-patient had repeat Limb x-rays and Cervical spine x-rays, knee xrays 06/17/12 showing no acute fracture.  Symptomatic management 3. Hypotension-? 2/2 to vol depletion from Anemia/Diarrheoa-will give a 500 cc bolus.  If persists will start IV at a rate of 75 cc/ hour and reassess 4. Seizure activity-probably related to irritation from subdural hemorrhage-continue Phenytoin 100 tid 5. Elevated white cell count-DDx-Infectious but probably new oncological process not clearly defined -appreciate Dr. Arline Asp of Oncology's input-unlikely thought to benefit from any chemotherapy at this point 6. Atrial fibrillation with elevated [place] score plus bradycardia-none any rate controlling medications-not a Coumadin candidate given falls 7. Pancytopenia + mild dysplastic syndrome-see above Continue iron and erythrogenic factor-His Hb dropped to 6.6 on 3.22 and he was transfused 2 u PRBC's with satisfactory results-rpt CBC in am 8. End-stage renal disease-have alerted nephrology to patient being care. Per Nephrology 9. Cardiomyopathy plus history CAD-not candidate for aspirin at present time. Further management as per outpatient cardiology once discharge-stable-EKG does not show any acute changes  Code Status: DNR Family Communication: discussed and updated wife in detail-will discuss with other family as they  arrive Disposition Plan: inpatient-tele   Pleas Koch, MD  [facility] Pager [phone] 06/18/2012, 8:09 AM    LOS: 2 days

## 2012-06-18 NOTE — Consult Note (Signed)
NAME:  Tracy, [name] NO.:  [id number]  MEDICAL RECORD NO.:  [id number]  LOCATION:  6702                         FACILITY:  [facility]  PHYSICIAN:  Tracy [name], M.D.DATE OF BIRTH:  [birth year]  DATE OF CONSULTATION:  06/17/2012 DATE OF DISCHARGE:                                CONSULTATION   HISTORY OF PRESENT ILLNESS:  Tracy [name] is a 79 year old white married male whom I am asked to see in consultation by Dr. Mahala [name] for evaluation of a white count which today was 74,900, after being 45,500 yesterday, the day of admission.  The patient has a number of medical problems.  He was admitted to the hospital yesterday because of a history of fall and striking his head.  He has been having a lot of falls recently.  CT scan from June 16, 2012, the day of admission, shows a new broad-based right hemispheric subdural hematoma, measuring up to 9 mm with local mass effect without midline shift.  CT was compared with the prior study from April 23, 2012.  The patient was admitted to the hospital and is under observation.  He has been evaluated by Neurosurgery who has recommended conservative treatment.  The patient apparently had a bone marrow aspirate and biopsy carried out by Dr. Si [name] on June 18, 2011.  The report shows a hypercellular bone marrow for his age with dyspoietic changes.  A diagnosis of myelodysplastic syndrome could not be made with certainty. Peripheral blood counts at that time indicated a white count of 3.2 with 61% neutrophils, 31% lymphs, and 4% monocytes.  Hemoglobin was 8.9, hematocrit 27.4, and platelet count was 125,000.  The patient apparently has had several admissions to the hospital over the past year and a half, dating back to the fall of 2012.  The most recent CBC we have in [facility] prior to the admission of yesterday goes back to February 22, 2012, when the patient apparently was hospitalized following a TURP that was associated  with postop bleeding and hematoma.  That CBC was as follows; white count was 5.6, hemoglobin 7.7, hematocrit 24.2, and platelets of 137,000.  We do not have the differential with that blood count.  If we go back to January 12, 2012, white count was 1.6, hemoglobin 9.2, hematocrit 31.1, and platelets were 71,000.  That differential indicated an ANC of 0.3, 15.3% neutrophils, 33.5% lymphs, and 51% monocytes.  No blasts were noted.  There were 5 nucleated red blood cells.  The patient has been receiving hemodialysis treatments since mid November 2013.  Apparently, blood work has been carried out at the outside facility where these treatments are being given.  We do not have any of those labs.  The patient's wife will try to see if she can find some of the interim blood counts done.  The history was obtained from the patient's wife, Tracy [name]; son, Tracy [name], who lives in [place]; and daughter, Tracy [name], who lives in [place], [place].  The patient apparently has not had any serious infections, no recent pneumonia.  He does bruise easily and apparently has prolonged bleeding from cuts and has also had some epistaxis.  He has also  been receiving red cell transfusions over the past year.  He has also had platelet transfusions and apparently needed fresh frozen plasma, which I believe dates back to September 2013, when he was on Coumadin and an INR was between 8 and 9.  The patient also has been receiving Procrit and Aranesp in the past.  At one point, he was getting erythropoietin stimulating agents through the Outpatient Medical Day at [facility].  Since he has been on dialysis, he apparently gets Aranesp with his hemodialysis treatments which are 3 times a week.  The patient's wife is not aware that he ever got Neupogen, but apparently he has had Neupogen during his hospitalization back in the fall of 2013.  At that time, he apparently was in the hospital for at least a month following complications  and other problems related to a TURP.  We were asked to see the patient because of his marked leukocytosis.  PAST MEDICAL HISTORY: 1. Coronary artery disease with a non-ST elevation myocardial     infarction in November 2012, history of atrial fibrillation that     dates back to November 2012.  At one time, the patient was on     Coumadin, but he has not been on Coumadin since the fall of 2013. 2. He has had several TIAs with apparently no residual neurologic     deficits. 3. He has a history of hypertension. 4. Dyslipidemia. 5. Generalized arthritis. 6. Congestive heart failure. 7. End-stage chronic renal disease, with dialysis treatments which     apparently also started around mid November 2013.  The patient does     have a fistula in his left upper extremity.  He undergoes     hemodialysis treatments 3 times a week as an outpatient  PAST SURGICAL HISTORY:  Also is quite extensive, includes: 1. Surgery for deviated septum. 2. Tonsillectomy and adenoidectomy. 3. Appendectomy. 4. Left ear mastoid surgery. 5. Removal of one testicle. 6. Surgery for detached retina involving the left eye. 7. Cataract surgery on the left eye. 8. Coronary angioplasty with stent placement in 1994 and 1996. 9. Cataract surgery on the right eye. 10.Total left knee arthroplasty by Dr. Norlene Campbell in 2009. 11.Transurethral resection of the prostate in late October 2013,     complicated by bleeding and hematoma formation. 12.The patient has had transfusions of red cells and platelets as     noted above. 13.The patient apparently has had epidural injections for back pain,     but has not had any treatment with epidural injections or other     steroids for at least 6 months.    PRIOR TO ADMISSION MEDICATIONS:    -acetaminophen (TYLENOL) 500 MG tablet  Take 500 mg by mouth every 6 (six) hours as needed. For pain    -doxercalciferol (HECTOROL) 4 MCG/2ML injection  Inject 2 mcg into the vein  every Monday, Wednesday, and Friday with hemodialysis.   -doxycycline (VIBRA-TABS) 100 MG tablet  Take 100 mg by mouth 2 (two) times daily. Filled 3/15 for 10 days   -epoetin alfa (EPOGEN,PROCRIT) 16109 UNIT/ML injection  Inject 1.4 Units into the skin every Monday, Wednesday, and Friday. 28,000 units   -Iron Sucrose (VENOFER IV)  Inject 50 mg into the vein every 7 (seven) days. Wednesday   -nitroGLYCERIN (NITROSTAT) 0.4 MG SL tablet  Place 0.4 mg under the tongue every 5 (five) minutes as needed. For chest pain     ALLERGIES:  He has had adverse reactions to CRESTOR which  has caused swelling, OXYCODONE which causes nausea and agitation, and PREDNISONE which apparently has caused weight gain.  FAMILY HISTORY:  The patient's father apparently died at age 18 of dementia.  Nothing else is known about the patient's primary family.  His daughter has hypertension.  There is no history of cancer or blood disorders.  SOCIAL HISTORY:  The patient smoked 1-2 packs of cigarettes a day for about 40 years.  He stopped smoking 33 years ago.  He is a modest consumer of alcohol.  The patient was born and raised in [place], [place].  In the late 1930s he had joined the [employer].  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  He was a Investment banker, operational.  He is married to his second wife, Tracy [name], as of 10.  The patient had driven on a regular basis up until latter part of 2013, and apparently drove a short distance just a couple of months ago.  His wife tells me that they have had to file chapter 13 and they are now living in apartment.  REVIEW OF SYSTEMS:  Essentially obtained from the patient's family.  The patient is apparently mentally fairly sharp, although he has had some problems with memory.  He is conversant on current events.  He wears glasses.  He has decreased hearing in his left ear.  He has lost approximately 50 pounds over  the past 6 months since the fall of 2013. Currently, his weight is around 150 pounds.  At one time, he weighed 190 to 200 pounds.  Height 6 feet 1 inch.  Appetite is poor.  He has had diarrhea frequently, but most recently in the last week, he has been having a lot of diarrhea after being placed on an antibiotic for possible infection of his left hand.  The patient is being evaluated for C. difficile.  He does have dyspnea on exertion.  No history of recent pneumonia.  No history of exertional chest pain, although the patient has taken nitroglycerin in the past.  He does have atrial fibrillation with onset apparently in the latter part of 2012.  The patient has had low urine output in recent months.  There has been no obvious hematuria. There has been some swelling of his feet and ankles recently.  No history of blood clots.  He has arthritis in his low back, fingers, and both knees.  He has had epidural steroid injections in the past, but not recently.  He has easy bruising, in fact on physical exam, multiple bruising due to falling.  He has had some epistaxis.  There has been no recent fever or night sweats or any obvious infections other than his left hand.  He has dry skin that is fragile.  No history of psychological problems or depression.  He does have a living will, and a no code blue status.  As stated, he has been falling frequently.  PHYSICAL EXAMINATION:  GENERAL:  The patient is an elderly, frail, cachectic chronically ill gentleman with lots of bruises that are widely disseminated. VITAL SIGNS:  Pulse is 109, irregularly irregular, consistent with atrial fibrillation.  Respiratory rate 18, blood pressure 103/50, O2 saturation is 98% on 3 L/minute of nasal O2.  He is afebrile with most recent temperature being 97.6. HEENT:  He has some ecchymoses around his right eye and other facial areas.  No obvious fracture as evidenced by CT scan of the head  and cervical spine.  No  scleral icterus.  Pupillary and extraocular movements are normal.  Mouth and pharynx are benign.  Dentition is fair. NECK:  No adenopathy in the neck. LUNGS:  There are some rales at the left base.  Otherwise, lungs are fairly clear.  Portable chest x-ray, 1 view, on 07/15/12, raised the question of mild congestive heart failure.  Atherosclerosis of the thoracic aorta was noted. ABDOMEN:  Benign with no organomegaly or masses palpable.  No tenderness.  The patient has had a previous appendectomy. MUSCULOSKELETAL:  Again, he has a lot of ecchymoses over the back.  No skeletal tenderness. EXTREMITIES:  No peripheral edema.  Lot of bruising over the knees, particularly the left knee where he has had a total knee arthroplasty. NEUROLOGIC:  The patient was alert, oriented.  He moves all extremities. He is cooperative.  He is unstable even when sitting.  LABORATORY DATA:  Today at 12:10 hours, white count was 74.9, hemoglobin 6.6, hematocrit 22.2, platelets 83,000.  On 2012/07/15, at 8:25 hours, white count was 45.5 with an ANC of 3.2, 5% neutrophils, 9% lymphs, and 82% monocytes.  Inspection of the peripheral blood smear from June 17, 2012, shows predominantly atypical monocytes, making up the majority of the cells.  There were nucleated red cells which were by megaloblastic and showed some dyssynchrony.  There were dysplastic myelocytes and promyelocytes as well as nucleated red cells.  Red cells showed variable shapes and sizes.  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  Chemistries from June 17, 2012, BUN 34, creatinine 6.34, potassium 5.8, sodium 135, and albumin of 2.6.  Currently pending are ProTime and PTT, as well as blood cultures, stool for C. difficile.   I should note that the patient did receive 2 units of packed red cells earlier today for his hemoglobin of 6.6.  IMPRESSION AND PLAN:  This 79 year old gentleman with multiple medical problems and a past medical history of myelodysplastic syndrome that dates back at least about a year ago, when he had a bone marrow carried out.  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  I see only a couple of blasts in the peripheral blood without obvious Auer rods present.  Certainly, the patient may have some precipitating factors such as C. difficile, or perhaps suspected seizure or other event that may have contributed to the marked leukocytosis in the setting of his underlying myelodysplastic syndrome.  At this point, I do not think he has acute leukemia, but I cannot completely exclude that.  Certainly, the jump in his white count over the past 24 hours is quite impressive.  I have had extensive discussion with the patient's family.  The patient has clearly stated his wishes about his do not resuscitate status.  I really do not have any specific recommendations to make.  Certainly, this patient would not be a candidate for any type of aggressive treatment if he did have acute leukemia and in fact, there would be probably no treatment that would have any true benefit for someone his age and with pre-existing myelodysplastic syndrome.  I think the [place] thing to do at this point is to simply be conservative and  to follow his blood counts.  One might give consideration to treating the white count if it got very high, for example over 200,000 or if the patient was felt to be developing hyperviscosity syndrome as a result of a marked leukocytosis.  Such treatment might include oral hydroxyurea.  I agree with the continued transfusional support at this time, particularly  red cells as needed as well as erythropoietin stimulating agents.     Tracy [name], M.D.     DSM/MEDQ  D:  06/17/2012  T:  06/17/2012  Job:  [id number]  cc:   Marjory Lies, M.D. Richard F. Caryn Section, M.D.

## 2012-06-18 NOTE — Evaluation (Signed)
Physical Therapy Evaluation Patient Details Name: Tracy [name] MRN: [id number] DOB: 10-04-1919 Today's Date: 06/18/2012 Time: 4132-4401 PT Time Calculation (min): 21 min  PT Assessment / Plan / Recommendation Clinical Impression  Patient is a 79 yo male admitted with fall with rt. subdural bleeding.  Patient with multiple falls pta.  Patient with decreased strength and cognition impacting safety/mobility.  Will benefit from acute PT to maximize independence prior to discharge.  Recommend SNF at discharge for continued therapy.    PT Assessment  Patient needs continued PT services    Follow Up Recommendations  SNF    Does the patient have the potential to tolerate intense rehabilitation      Barriers to Discharge Decreased caregiver support      Equipment Recommendations  None recommended by PT    Recommendations for Other Services     Frequency Min 3X/week    Precautions / Restrictions Precautions Precautions: Fall Precaution Comments: Multiple falls pta Restrictions Weight Bearing Restrictions: No   Pertinent Vitals/Pain       Mobility  Bed Mobility Bed Mobility: Rolling Left;Left Sidelying to Sit;Sit to Sidelying Left Rolling Left: 4: Min guard;With rail Left Sidelying to Sit: 4: Min assist;With rails;HOB flat Sit to Sidelying Left: 4: Min guard;HOB flat Details for Bed Mobility Assistance: Verbal and tactile cues for technique.  Assist to raise trunk from bed.  Assist for safety. Transfers Transfers: Sit to Stand;Stand to Sit Sit to Stand: 3: Mod assist;With upper extremity assist;From bed Stand to Sit: 4: Min assist;With upper extremity assist;To bed Details for Transfer Assistance: Verbal and tactile cues for hand placement.  Assist to rise to standing and for balance/safety.  Patient leaning posteriorly in standing. Ambulation/Gait Ambulation/Gait Assistance: 3: Mod assist Ambulation Distance (Feet): 24 Feet Assistive device: Rolling walker Ambulation/Gait  Assistance Details: Verbal cues for safe use of RW.  Physical assist to maneuver RW for safety.  Cues to stand up tall during gait.  Decreased balance with gait, requiring mod assist to maintain. Gait Pattern: Step-through pattern;Decreased step length - right;Decreased step length - left;Shuffle;Trunk flexed (Staggering gait) Gait velocity: Slow gait speed    Exercises     PT Diagnosis: Difficulty walking;Abnormality of gait;Generalized weakness;Altered mental status  PT Problem List: Decreased strength;Decreased activity tolerance;Decreased balance;Decreased mobility;Decreased cognition;Decreased knowledge of use of DME;Decreased safety awareness PT Treatment Interventions: DME instruction;Gait training;Functional mobility training;Balance training;Cognitive remediation;Patient/family education   PT Goals Acute Rehab PT Goals PT Goal Formulation: With patient Time For Goal Achievement: 07/02/12 Potential to Achieve Goals: Fair Pt will go Supine/Side to Sit: with supervision;with HOB 0 degrees PT Goal: Supine/Side to Sit - Progress: Goal set today Pt will go Sit to Stand: with supervision;with upper extremity assist PT Goal: Sit to Stand - Progress: Goal set today Pt will Ambulate: 51 - 150 feet;with supervision;with rolling walker PT Goal: Ambulate - Progress: Goal set today  Visit Information  Last PT Received On: 06/18/12 Assistance Needed: +1    Subjective Data  Subjective: "I fell Friday here in the bathroom and hit my head." (Fall was Saturday) Patient Stated Goal: None stated   Prior Functioning  Home Living Lives With: Spouse Available Help at Discharge: Family;Available 24 hours/day Type of Home: House (Townhouse) Home Access: Stairs to enter [employer] of Steps: 2  Entrance Stairs-Rails: Right Home Layout: Two level Alternate Level Stairs-Number of Steps: 13 to bedrooms and 2 to living room/dining and kitchen. Alternate Level Stairs-Rails: Left Bathroom  Shower/Tub: Tub/shower unit Home Adaptive Equipment: Dan Humphreys -  rolling;Wheelchair - manual;Tub transfer bench;Hand-held shower hose Prior Function Level of Independence: Needs assistance Needs Assistance: Meal Prep;Light Housekeeping;Gait Meal Prep: Moderate Light Housekeeping: Total Gait Assistance: has supervision for stairs Able to Take Stairs?: Yes Driving: No Vocation: Retired Musician: No difficulties    Copywriter, advertising Overall Cognitive Status: Impaired Area of Impairment: Memory;Safety/judgement;Awareness of errors;Awareness of deficits Arousal/Alertness: Lethargic Orientation Level: Disoriented to;Time;Situation Behavior During Session: Restless Memory Deficits: Difficulty recalling recent events.  Difficulty answering questions related to living situation and PLOF. Safety/Judgement: Impulsive;Decreased safety judgement for tasks assessed;Decreased awareness of need for assistance Safety/Judgement - Other Comments: Moving prior to equipment being in place. Awareness of Errors: Assistance required to identify errors made;Assistance required to correct errors made Awareness of Deficits: States he had a stroke.    Extremity/Trunk Assessment Right Upper Extremity Assessment RUE ROM/Strength/Tone: WFL for tasks assessed Left Upper Extremity Assessment LUE ROM/Strength/Tone: WFL for tasks assessed Right Lower Extremity Assessment RLE ROM/Strength/Tone: Deficits RLE ROM/Strength/Tone Deficits: Strength 4-/5 RLE Sensation: WFL - Light Touch Left Lower Extremity Assessment LLE ROM/Strength/Tone: Deficits LLE ROM/Strength/Tone Deficits: Strength 4-/5 LLE Sensation: WFL - Light Touch   Balance Balance Balance Assessed: Yes Static Sitting Balance Static Sitting - Balance Support: Bilateral upper extremity supported;Feet supported Static Sitting - Level of Assistance: 5: Stand by assistance Static Sitting - Comment/# of Minutes: 3 Static Standing  Balance Static Standing - Balance Support: Bilateral upper extremity supported Static Standing - Level of Assistance: 4: Min assist Static Standing - Comment/# of Minutes: 2 minutes - patient leaning posteriorly  End of Session PT - End of Session Equipment Utilized During Treatment: Gait belt Activity Tolerance: Patient limited by fatigue Patient left: in bed;with call bell/phone within reach;with bed alarm set Nurse Communication: Mobility status  GP     Vena Austria 06/18/2012, 5:32 PM Durenda Hurt. Renaldo Fiddler, [facility] Pager [phone]

## 2012-06-18 NOTE — Progress Notes (Signed)
06/17/2012 nurse tech reported while taking patient to bathroom his leg got weak, the incident happen at 1851. She reported another Rn came in the room and help patient to bathroom. Per nurse tech while patient was sitting on toilet the hit the left side of head on sink in bathroom. Rn for patient was notified, vitals were taken, patient was assessed and neuro checks were done on patient. Patient had a small abrasion on shoulder and a red area. No bump was noted on left side of head. Wife, Nurse practitioner on call and floor Nursing director for 6700 was notified. [facility] RN.

## 2012-06-19 ENCOUNTER — Inpatient Hospital Stay (HOSPITAL_COMMUNITY): Payer: Medicare Other

## 2012-06-19 LAB — MAGNESIUM: Magnesium: 2.1 mg/dL (ref 1.5–2.5)

## 2012-06-19 LAB — CBC WITH DIFFERENTIAL/PLATELET
Band Neutrophils: 0 % (ref 0–10)
Basophils Absolute: 0 10*3/uL (ref 0.0–0.1)
Basophils Relative: 0 % (ref 0–1)
Eosinophils Absolute: 0.6 10*3/uL (ref 0.0–0.7)
Eosinophils Relative: 1 % (ref 0–5)
HCT: 24.9 % — ABNORMAL LOW (ref 39.0–52.0)
Hemoglobin: 7.8 g/dL — ABNORMAL LOW (ref 13.0–17.0)
Lymphocytes Relative: 0 % — ABNORMAL LOW (ref 12–46)
Lymphs Abs: 0 10*3/uL — ABNORMAL LOW (ref 0.7–4.0)
MCH: 31.7 pg (ref 26.0–34.0)
Neutro Abs: 8.3 10*3/uL — ABNORMAL HIGH (ref 1.7–7.7)
Neutrophils Relative %: 14 % — ABNORMAL LOW (ref 43–77)
RBC Morphology: 8
RBC: 2.46 MIL/uL — ABNORMAL LOW (ref 4.22–5.81)

## 2012-06-19 LAB — COMPREHENSIVE METABOLIC PANEL
BUN: 27 mg/dL — ABNORMAL HIGH (ref 6–23)
Calcium: 8.5 mg/dL (ref 8.4–10.5)
Creatinine, Ser: 4.76 mg/dL — ABNORMAL HIGH (ref 0.50–1.35)
GFR calc Af Amer: 11 mL/min — ABNORMAL LOW (ref >90)
Glucose, Bld: 123 mg/dL — ABNORMAL HIGH (ref 70–99)
Total Protein: 6.8 g/dL (ref 6.0–8.3)

## 2012-06-19 MED ORDER — HEPARIN SODIUM (PORCINE) 1000 UNIT/ML DIALYSIS: 1000.0000 [IU] | INTRAMUSCULAR | Status: DC | PRN

## 2012-06-19 MED ORDER — LIDOCAINE HCL (PF) 1 % IJ SOLN: 5.0000 mL | INTRAMUSCULAR | Status: DC | PRN

## 2012-06-19 MED ORDER — LIDOCAINE-PRILOCAINE 2.5-2.5 % EX CREA: 1.0000 "application " | TOPICAL_CREAM | CUTANEOUS | Status: DC | PRN

## 2012-06-19 MED ORDER — PENTAFLUOROPROP-TETRAFLUOROETH EX AERO: 1.0000 "application " | INHALATION_SPRAY | CUTANEOUS | Status: DC | PRN

## 2012-06-19 MED ORDER — ALBUMIN HUMAN 25 % IV SOLN
INTRAVENOUS | Status: AC
Start: 2012-06-19 — End: 2012-06-20
  Filled 2012-06-19: qty 100

## 2012-06-19 MED ORDER — HALOPERIDOL LACTATE 5 MG/ML IJ SOLN
2.0000 mg | INTRAMUSCULAR | Status: DC | PRN
  Administered 2012-06-19: 2 mg via INTRAVENOUS
  Filled 2012-06-19: qty 1

## 2012-06-19 MED ORDER — NEPRO/CARBSTEADY PO LIQD
237.0000 mL | ORAL | Status: DC | PRN
  Filled 2012-06-19: qty 237

## 2012-06-19 MED ORDER — SODIUM CHLORIDE 0.9 % IV SOLN: 100.0000 mL | INTRAVENOUS | Status: DC | PRN

## 2012-06-19 MED ORDER — ALTEPLASE 2 MG IJ SOLR: 2.0000 mg | Freq: Once | INTRAMUSCULAR | Status: AC | PRN

## 2012-06-19 MED ORDER — VANCOMYCIN HCL 1000 MG IV SOLR
750.0000 mg | INTRAVENOUS | Status: DC
  Administered 2012-06-20: 750 mg via INTRAVENOUS
  Filled 2012-06-19 (×2): qty 750

## 2012-06-19 NOTE — Progress Notes (Signed)
Patient ID: Tracy [name], male   DOB: 1920-01-17, 79 y.o.   MRN: [id number] BP 111/72  Pulse 107  Temp(Src) 97.6 F (36.4 C) (Oral)  Resp 22  Ht 6\' 1"  (1.854 m)  Wt 74.3 kg (163 lb 12.8 oz)  BMI 21.62 kg/m2  SpO2 96% Events noted. No new recs Will sign off.

## 2012-06-19 NOTE — Progress Notes (Signed)
Pt getting dialysis Tx. Pt is confused and very restless. Continually moving arms. Has a small puncture wound below needle sites on his fistula that began oozing blood. Pressure held at the site and the bleeding stopped. Pressure dsg applied.

## 2012-06-19 NOTE — Progress Notes (Signed)
Chest CT reviewed, showing diffuse pulm edema and L effusion.  He is 4-5 kg over his dry weight. Howeve, BP is low in 90's, pt still confused and didn't tolerate attempt at HD this morning. Don't know if he will tolerate HD but feel we need to try -- plan HD tonight with UF as tolerated, will use Haldol and restraints as needed.   [name]  MD [phone] pgr    [phone] cell 06/19/2012, 6:20 PM

## 2012-06-19 NOTE — Progress Notes (Signed)
Pt attempted to be taken for dialysis at 7:25 this am. Dialysis RN requested wrist restraints to safely perform dialysis, but informed me that even with the wrist restraints, she was unable to stick him, he was moving all over the bed, and will require a one-on-one dialysis treatment. Will re-attempt dialysis later today per dialysis RN Suzette Battiest. MD notified. Will not continue wrist restraints at this time, as pt is resting comfortably in the bed. Bed alarm on. Red socks and yellow bracelet on.

## 2012-06-19 NOTE — Progress Notes (Signed)
Report given to nurse on 3300.  

## 2012-06-19 NOTE — Progress Notes (Signed)
PROGRESS NOTE  Tracy [name] WUJ:[id number] DOB: 10/23/1919 DOA: 23-Jun-2012 PCP: [name], MD  Brief narrative: 79 yr old male with multiple medical illnesses presented with fall abnd subdural hematoma on 23-Jun-2012.  He was rousable initally but very confused and agitated.  Ct scan showed a stable bleed, but patient had a seizure in the Ed prompting a load of dilantin.  Patient seen by NS, and thought not to need intervention. Patient had a white count of 72,000 which promted along with hypotension and some epiosdes of diarrhea, which prompted search for a specific cause.  Oncology was consulted and a septic work-up was undertaken.  He has remained somewhat hypotensive and had another episode of fall and syncopy as well on the night of 3.22.14 with a new subdural hematoma. He was a little more confused on 3.24.14 and noted to be somewhat hypoxic and a CXr showed possible PNa vs pleural effusion.  He was transferred back to the SDU for closer monitoring    Past medical history-As per Problem list Chart reviewed as below- Chart review  Ed evaluation for falls 1.26.14  D/c from Inpatient rehab 11.27.13 for deconsditioning-Gross hematuria post-TURP  Admission for BPH s/p TURP 10.25.13  Initiation of HD sometime in 01/2012  Admission 9.27.13 for ABLA-thought to Have UGIB + hemetemesis-coumadin stopped at that point  Admission 12/03/11 for Hypercalcemia-thought to be dietary related  Endscopy by Dr. Dulce Sellar 12/03/11 showed mild gatritis  Admission 11/30/05 with R post Cerebral Infarct  CAD s/p PTCA to RCA, distal RCA and post desc branch RCA as well  Consultants:  Nephrology  Procedures:  CT head 3/21-Motion degraded exam. Cervical spine fracture cannot be excluded on present exam. Follow-up imaging when the patient is better able to cooperate recommended. Rotation of the head and C1 ring on C2. Critical Value/emergent results were called by telephone at the time of interpretation on 06/11/2012 at  9:40 a.m. to Dr. Effie Shy, who verbally acknowledged these results.  CT head repeat 3/22 1. Negative for fracture or other acute bony abnormality. 2. Multilevel degenerative changes as enumerated above. 3. Loss of the normal cervical spine lordosis, which may be secondary to positioning, spasm, or soft tissue injury. 4. Bilateral carotid bifurcation plaque.   Ct neck-NAD-maybe some protrusion C4-5,C5-6, C6-7  Hand and Knee x-ray 3.22 showed no acute fractures  CXR 3.22=Improved puolm edema atelectasis  CT head repeat 3.22 showed new small subdural    Antibiotics:  Vancomycin 3.22.14  Zosyn 3.22.14   Subjective  Somewhat confused.  Some echolalia which is new, knows where he is, city, date, season and year.  Doesn;t claim to have any complaints currently   Objective    Interim History:  nad   Telemetry: NSR  Objective: Filed Vitals:   06/18/12 1841 06/18/12 2128 06/19/12 0556 06/19/12 0925  BP: 98/49 91/53 95/58  97/51  Pulse: 114 106 115 101  Temp: 97.7 F (36.5 C) 98.1 F (36.7 C) 97.6 F (36.4 C) 97.2 F (36.2 C)  TempSrc: Oral Oral Axillary Oral  Resp: 18 18 20 18   Height:      Weight:  73.755 kg (162 lb 9.6 oz)    SpO2: 95% 93% 85% 93%    Intake/Output Summary (Last 24 hours) at 06/19/12 1048 Last data filed at 06/19/12 0900  Gross per 24 hour  Intake   2125 ml  Output      1 ml  Net   2124 ml    Exam:  General: Sleepy, awakens and more oreinted  Eyes: Pupils about 3 mm bilaterally ENT: Soft supple no tenderness to spine no thyromegaly or JVP-R temporal area shows new bruise Neck: C. above  Cardiovascular: S1-S2 no murmur rub or gallop  Respiratory: Clinically clear   Data Reviewed: Basic Metabolic Panel:  Recent Labs Lab 06/06/2012 0825 06/17/12 1210 06/19/12 0816  NA 136 135 139  K 4.6 5.8* 4.2  CL 95* 92* 98  CO2 22 12* 21  GLUCOSE 100* 143* 123*  BUN 24* 34* 27*  CREATININE 4.99* 6.34* 4.76*  CALCIUM 9.2 9.0 8.5  PHOS  --  7.5*  --     Liver Function Tests:  Recent Labs Lab 06/17/12 1210 06/19/12 0816  AST  --  162*  ALT  --  86*  ALKPHOS  --  136*  BILITOT  --  2.8*  PROT  --  6.8  ALBUMIN 2.6* 2.7*   No results found for this basename: LIPASE, AMYLASE,  in the last 168 hours No results found for this basename: AMMONIA,  in the last 168 hours CBC:  Recent Labs Lab 06/24/2012 0825 06/17/12 1210 06/18/12 0425 06/19/12 0816  WBC 45.5* 74.9* 52.9* 59.2*  NEUTROABS 3.2  --  4.8 8.3*  HGB 7.5* 6.6* 8.0* 7.8*  HCT 25.0* 22.2* 26.2* 24.9*  MCV 104.6* 105.7* 102.7* 101.2*  PLT 82* 83* 72* 75*   Cardiac Enzymes: No results found for this basename: CKTOTAL, CKMB, CKMBINDEX, TROPONINI,  in the last 168 hours BNP: No components found with this basename: POCBNP,  CBG: No results found for this basename: GLUCAP,  in the last 168 hours  Recent Results (from the past 240 hour(s))  MRSA PCR SCREENING     Status: None   Collection Time    06/08/2012 12:55 PM      Result Value Range Status   MRSA by PCR NEGATIVE  NEGATIVE Final   Comment:            The GeneXpert MRSA Assay (FDA     approved for NASAL specimens     only), is one component of a     comprehensive MRSA colonization     surveillance program. It is not     intended to diagnose MRSA     infection nor to guide or     monitor treatment for     MRSA infections.  CULTURE, BLOOD (ROUTINE X 2)     Status: None   Collection Time    06/17/12  1:55 PM      Result Value Range Status   Specimen Description BLOOD AVF   Final   Special Requests BOTTLES DRAWN AEROBIC AND ANAEROBIC 10CC   Final   Culture  Setup Time 06/17/2012 21:26   Final   Culture     Final   Value:        BLOOD CULTURE RECEIVED NO GROWTH TO DATE CULTURE WILL BE HELD FOR 5 DAYS BEFORE ISSUING A FINAL NEGATIVE REPORT   Report Status PENDING   Incomplete  CULTURE, BLOOD (ROUTINE X 2)     Status: None   Collection Time    06/17/12  2:10 PM      Result Value Range Status   Specimen  Description BLOOD AVF   Final   Special Requests BOTTLES DRAWN AEROBIC AND ANAEROBIC 10CC   Final   Culture  Setup Time 06/17/2012 21:26   Final   Culture     Final   Value:        BLOOD CULTURE  RECEIVED NO GROWTH TO DATE CULTURE WILL BE HELD FOR 5 DAYS BEFORE ISSUING A FINAL NEGATIVE REPORT   Report Status PENDING   Incomplete  CLOSTRIDIUM DIFFICILE BY PCR     Status: None   Collection Time    06/18/12 11:21 AM      Result Value Range Status   C difficile by pcr NEGATIVE  NEGATIVE Final     Studies:              All Imaging reviewed and is as per above notation   Scheduled Meds: . darbepoetin (ARANESP) injection - DIALYSIS  200 mcg Intravenous Q Sat-HD  . midodrine  10 mg Oral TID WC  . phenytoin (DILANTIN) IV  100 mg Intravenous Q8H  . piperacillin-tazobactam (ZOSYN)  IV  2.25 g Intravenous Q8H  . sodium chloride  3 mL Intravenous Q12H  . vancomycin  750 mg Intravenous Q M,W,F-HD   Continuous Infusions: . sodium chloride 50 mL/hr at 06/18/12 1254     Assessment/Plan: 1. Syncopal episode resulting in multiple subdural bleed-neurosurgery has been consulted-at this time no surgical intervention is recommended. Patient had a further fall on the night of 3.22 resulting in yet another subdural hematoma, probably from orthostasis.  Due to some echolalia and persistent confusion a third Head Ct was ordered on 06/19/12 as well 2. Fall-patient had repeat Limb x-rays and Cervical spine x-rays, knee xrays 06/17/12 showing no acute fracture.  Symptomatic management 3. Hypotension-DDx early sepsis from Aspiration PNA vs ? 2/2 to vol depletion from Anemia/Diarrhea-Continue IVF @ 50 cc per hour.  Midodrine started on 06/19/12 and some improvement of blood pressure to the mid 90 systolic range.  Will transfer to SDU for further monitoring 4. Aspiration-will get Speech therapy evaluation 5. Seizure activity-probably related to irritation from subdural hemorrhage-continue Phenytoin 100 tid 6. Elevated  white cell count-DDx-Infectious [see above] ? new oncological process not clearly defined -appreciate Dr. Arline Asp of Oncology's input-unlikely thought to benefit from any chemotherapy at this point 7. Atrial fibrillation with elevated [place] score plus bradycardia-none any rate controlling medications-not a Coumadin candidate given falls 8. Pancytopenia + mild dysplastic syndrome-see above Continue iron and erythrogenic factor-His Hb dropped to 6.6 on 3.22 and he was transfused 2 u PRBC's with satisfactory results to Hb 8.  monitor 9. End-stage renal disease-have alerted nephrology to patient being care. Per Nephrology 10. Cardiomyopathy plus history CAD-not candidate for aspirin at present time. Further management as per outpatient cardiology once discharge-stable-EKG does not show any acute changes  Code Status: DNR Family Communication: Long discussion about Goals of care withson and daughter on 3.24.14.  They are understanding that this event might change his long-term prognosis and are aware that he is ill.  THey do not want heroic measures but think it would be reasonable to use low dose dopamine if his blood pressure drops.  I updated them today 3.24 about chang ein cours eof events and possible PNa and ? Extension of bleed and they agree to the plan above Disposition Plan: inpatient-tele   Pleas Koch, MD  [facility] Pager [phone] 06/19/2012, 10:48 AM    LOS: 3 days

## 2012-06-19 NOTE — Progress Notes (Signed)
Subjective:  Tried to dialyze but too agitated, confused. WBC 59k today, BP 90's, wt up to 73 kg after NS boluses  Objective: Vital signs in last 24 hours: Temp:  [97.2 F (36.2 C)-98.1 F (36.7 C)] 97.2 F (36.2 C) (03/24 0925) Pulse Rate:  [101-115] 101 (03/24 0925) Resp:  [18-20] 18 (03/24 0925) BP: (91-98)/(49-58) 97/51 mmHg (03/24 0925) SpO2:  [85 %-95 %] 93 % (03/24 0925) Weight:  [73.755 kg (162 lb 9.6 oz)] 73.755 kg (162 lb 9.6 oz) (03/23 2128) Weight change: 5.655 kg (12 lb 7.5 oz)  Intake/Output from previous day: 03/23 0701 - 03/24 0700 In: 2365 [P.O.:960; I.V.:1105; IV Piggyback:300] Out: 1 [Stool:1] Intake/Output this shift: Total I/O In: 120 [P.O.:120] Out: -   Lab Results:  Recent Labs  06/18/12 0425 06/19/12 0816  WBC 52.9* 59.2*  HGB 8.0* 7.8*  HCT 26.2* 24.9*  PLT 72* 75*   BMET:   Recent Labs  06/17/12 1210 06/19/12 0816  NA 135 139  K 5.8* 4.2  CL 92* 98  CO2 12* 21  GLUCOSE 143* 123*  BUN 34* 27*  CREATININE 6.34* 4.76*  CALCIUM 9.0 8.5  ALBUMIN 2.6* 2.7*   No results found for this basename: PTH,  in the last 72 hours Iron Studies: No results found for this basename: IRON, TIBC, TRANSFERRIN, FERRITIN,  in the last 72 hours  EXAM: General appearance:  Lethargic, able to arouse briefly Resp:  dec'd L base, R mostly clear Cardio:  Tachycardic, no murmur or rub GI:  + BS, soft and nontender Extremities:  No edema Access:  AVF @ LFA with + bruit  Dialysis Orders: Center: [place] on MWF.  EDW 69.5 kg HD Bath 3K/2.25Ca Time 4hrs Heparin 0. Access AVF @ LFA BFR 400 DFR A1.5 Hectorol 2 mcg IV/HD Epogen 28,000 Units IV/HD Venofer 0.   Assessment/Plan: 1. AMS/agitation- multifactorial, hypoxia, subdural/fall. D/W primary MD, will transfer to SDU. Check dilantin level 2. S/P fall with right SDH- had another fall and L SDH in hospital 3. Hypoxemia/pulm infiltrates L >> R- could be aspiration PNA/pneumonitis, don't think this is vol  overload clinically. D/W primary, is on abx with vanc/zosyn and O2 support. May benefit from chest CT 4. Hypotension/^^WBC- possible sepsis, hx MDS, seen by Oncology, no evidence of blast crisis 5. Seizure activity (in ED on day of fall/admission)- secondary to #1; started on IV dilantin 6. ESRD, cont mwf HD- did not tolerate HD today due to agitation?Will reassess later today 7. HTN/volume- was hypotensive and got IVF"s, now is 4kg over dry wt, BP still 90's 8. Anemia - Hb 7.8, rec'd 2u prbc's and Aranesp 200 mcg on 3/22  9. Metabolic bone disease - Ca 9 (78.2 corrected), P 7.5, iPTH 239 on 1/22; no binders. 10. Nutrition - last Alb 2.6, renal vitamin 11. Hx Atrial fib - on Metoprolol, not a candidate for Coumadin. 12. Hx thrombocytopenia - Plts 72K, no Heparin with HD 13. DNR    [name]  MD [phone] pgr    [phone] cell 06/19/2012, 10:49 AM

## 2012-06-19 NOTE — Progress Notes (Signed)
Discussed outcome of Ct with wife and SON HCPOA on telephone.  Prognosis looks poor given likely inability to dialyze as limited by blood pressure and overall decline in mental status.  Discussed non-use of pressors given tachycardic and likelihood that discussion with Palliative care  may be appropriate given his stats.  Family understands.  Pleas Koch, MD [facility] [phone]

## 2012-06-19 NOTE — Progress Notes (Signed)
Utilization review completed.  

## 2012-06-20 ENCOUNTER — Inpatient Hospital Stay (HOSPITAL_COMMUNITY): Payer: Medicare Other

## 2012-06-20 LAB — CBC WITH DIFFERENTIAL/PLATELET
Band Neutrophils: 1 % (ref 0–10)
Basophils Absolute: 0 10*3/uL (ref 0.0–0.1)
Basophils Relative: 0 % (ref 0–1)
Eosinophils Absolute: 0.7 10*3/uL (ref 0.0–0.7)
Eosinophils Relative: 1 % (ref 0–5)
Hemoglobin: 7.3 g/dL — ABNORMAL LOW (ref 13.0–17.0)
MCH: 31.3 pg (ref 26.0–34.0)
MCV: 101.3 fL — ABNORMAL HIGH (ref 78.0–100.0)
Metamyelocytes Relative: 0 %
Myelocytes: 0 %
Platelets: 72 10*3/uL — ABNORMAL LOW (ref 150–400)
RBC: 2.33 MIL/uL — ABNORMAL LOW (ref 4.22–5.81)

## 2012-06-20 LAB — BASIC METABOLIC PANEL
Calcium: 8.1 mg/dL — ABNORMAL LOW (ref 8.4–10.5)
GFR calc non Af Amer: 14 mL/min — ABNORMAL LOW (ref >90)
Sodium: 142 mEq/L (ref 135–145)

## 2012-06-20 MED ORDER — DIGOXIN 0.0625 MG HALF TABLET
0.0625 mg | ORAL_TABLET | Freq: Every day | ORAL | Status: DC
  Administered 2012-06-20: 0.0625 mg via ORAL
  Filled 2012-06-20 (×2): qty 1

## 2012-06-20 MED ORDER — SODIUM CHLORIDE 0.9 % IV SOLN
500.0000 mg | INTRAVENOUS | Status: DC
  Filled 2012-06-20: qty 500

## 2012-06-20 MED ORDER — PHENYTOIN SODIUM EXTENDED 100 MG PO CAPS
100.0000 mg | ORAL_CAPSULE | Freq: Three times a day (TID) | ORAL | Status: DC
  Filled 2012-06-20 (×2): qty 1

## 2012-06-20 NOTE — Progress Notes (Signed)
PT Cancellation Note  Patient Details Name: Tracy [name] MRN: [id number] DOB: 10/17/1919   Cancelled Treatment:    Reason Eval/Treat Not Completed: Medical issues which prohibited therapy (pt has been very restless and confused all night.)   Burgandy Hackworth 06/20/2012, 9:05 AM

## 2012-06-20 NOTE — Progress Notes (Signed)
Clinical Child psychotherapist (CSW) informed that pt transferred from 81. CSW observed that PT has recommended SNF however MD to consult palliative care to discuss GOC. CSW will make appropriate referrals once GOC have been confirmed.  Theresia Bough, MSW, Theresia Majors [phone]

## 2012-06-20 NOTE — Progress Notes (Signed)
Patient's HR maintaining in the high 110-120s, BP 112/58. Kirtland Bouchard Schorr notified. No new orders received. Will continue to monitor.   Rochele Pages, RN

## 2012-06-20 NOTE — Progress Notes (Addendum)
ANTIBIOTIC CONSULT NOTE - FOLLOW UP  Pharmacy Consult for vancomycin/zosyn Indication: pneumonia  Patient Measurements: Height: 6\' 1"  (185.4 cm) Weight:  (pt sitting on side of bed, unable to get wt) IBW/kg (Calculated) : 79.9  Vital Signs: Temp: 97.8 F (36.6 C) (03/25 0800) Temp src: Axillary (03/25 0800) BP: 91/50 mmHg (03/25 0800) Pulse Rate: 124 (03/25 0800) Intake/Output from previous day: 03/24 0701 - 03/25 0700 In: 1240 [P.O.:240; I.V.:700; IV Piggyback:300] Out: 2895  Intake/Output from this shift: Total I/O In: -  Out: 1 [Stool:1]  Labs:  Recent Labs  06/17/12 1210 06/18/12 0425 06/19/12 0816 06/20/12 0453  WBC 74.9* 52.9* 59.2* 66.5*  HGB 6.6* 8.0* 7.8* 7.3*  PLT 83* 72* 75* 72*  CREATININE 6.34*  --  4.76* 3.47*   Estimated Creatinine Clearance: 15.2 ml/min (by C-G formula based on Cr of 3.47). No results found for this basename: VANCOTROUGH, Leodis Binet, VANCORANDOM, GENTTROUGH, GENTPEAK, GENTRANDOM, TOBRATROUGH, TOBRAPEAK, TOBRARND, AMIKACINPEAK, AMIKACINTROU, AMIKACIN,  in the last 72 hours   Microbiology: Recent Results (from the past 720 hour(s))  MRSA PCR SCREENING     Status: None   Collection Time    06-26-2012 12:55 PM      Result Value Range Status   MRSA by PCR NEGATIVE  NEGATIVE Final   Comment:            The GeneXpert MRSA Assay (FDA     approved for NASAL specimens     only), is one component of a     comprehensive MRSA colonization     surveillance program. It is not     intended to diagnose MRSA     infection nor to guide or     monitor treatment for     MRSA infections.  CULTURE, BLOOD (ROUTINE X 2)     Status: None   Collection Time    06/17/12  1:55 PM      Result Value Range Status   Specimen Description BLOOD AVF   Final   Special Requests BOTTLES DRAWN AEROBIC AND ANAEROBIC 10CC   Final   Culture  Setup Time 06/17/2012 21:26   Final   Culture     Final   Value:        BLOOD CULTURE RECEIVED NO GROWTH TO DATE CULTURE WILL  BE HELD FOR 5 DAYS BEFORE ISSUING A FINAL NEGATIVE REPORT   Report Status PENDING   Incomplete  CULTURE, BLOOD (ROUTINE X 2)     Status: None   Collection Time    06/17/12  2:10 PM      Result Value Range Status   Specimen Description BLOOD AVF   Final   Special Requests BOTTLES DRAWN AEROBIC AND ANAEROBIC 10CC   Final   Culture  Setup Time 06/17/2012 21:26   Final   Culture     Final   Value:        BLOOD CULTURE RECEIVED NO GROWTH TO DATE CULTURE WILL BE HELD FOR 5 DAYS BEFORE ISSUING A FINAL NEGATIVE REPORT   Report Status PENDING   Incomplete  CLOSTRIDIUM DIFFICILE BY PCR     Status: None   Collection Time    06/18/12 11:21 AM      Result Value Range Status   C difficile by pcr NEGATIVE  NEGATIVE Final    Anti-infectives   Start     Dose/Rate Route Frequency Ordered Stop   06/20/12 1800  vancomycin (VANCOCIN) 750 mg in sodium chloride 0.9 % 150 mL IVPB  Status:  Discontinued     750 mg 150 mL/hr over 60 Minutes Intravenous Every T-Th-S-Su (1800) 06/17/12 1427 06/19/12 0844   06/19/12 1200  vancomycin (VANCOCIN) 750 mg in sodium chloride 0.9 % 150 mL IVPB     750 mg 150 mL/hr over 60 Minutes Intravenous Every M-W-F (Hemodialysis) 06/19/12 0844     06/17/12 1430  vancomycin (VANCOCIN) 1,500 mg in sodium chloride 0.9 % 500 mL IVPB     1,500 mg 250 mL/hr over 120 Minutes Intravenous  Once 06/17/12 1427 06/17/12 1732   06/17/12 1430  piperacillin-tazobactam (ZOSYN) IVPB 2.25 g     2.25 g 100 mL/hr over 30 Minutes Intravenous 3 times per day 06/17/12 1427        Assessment: 79 year old male admitted with fall, currently on day #3 of vancomycin and zosyn. Reports of trouble with am dialysis although it appears that he went for 4h at 482ml/min bfr UF last night. No fevers noted, wbc trending upward at 66 but fairly stable. No dose adjustments needed at this time will plan on checking vancomycin pre-hd level later in the week.  Vanc 3/22 >> Zosyn 3/22 >>  3/22 BCx >> ngtd 3/23  CDiff PCR negative  Goal of Therapy:  Pre HD vancomycin level of 15-25   Plan:  Continue zosyn 2.25g IV q8 hours Vancomycin 750mg  every MWF after HD - likely check level Friday Follow tolerance of HD in am. Consider checking dilantin level 3/26  Sheppard Coil PharmD., BCPS Clinical Pharmacist Pager [phone] 06/20/2012 10:32 AM

## 2012-06-20 NOTE — Progress Notes (Signed)
Thank you for consulting the Palliative Medicine Team at [facility] to meet your patient's and family's needs.   The reason that you asked [place] to see your patient is for goals of care hospice options  We have scheduled your patient for a meeting: tomorrow, Wednesday 06/02/2012 @ 12 noon  The Surrogate decision maker is: son, Ryleigh Buenger Contact information: c: [phone]  Other family members that need to be present: wife Genie [phone]  Your patient is able/unable to participate: unable to participate  Additional Narrative: per wife Genie- patient's children: son, Reuel Boom lives in [place] and daughter lives outside of [place]; all three make decisions for patient's care, however, per wife "Reuel Boom has the final say" - wife requests meeting time for noon so both children may be able to participate via conference call; she will be physically present    Valente Carnell, RN 06/20/2012, 4:25 PM Palliative Medicine Team RN Liaison [phone]

## 2012-06-20 NOTE — Progress Notes (Addendum)
PROGRESS NOTE  Tracy [name] RUE:[id number] DOB: 1919-12-10 DOA: 06/22/2012 PCP: [name], MD  Brief narrative: 79 yr old male with multiple medical illnesses presented with fall abnd subdural hematoma on 06/13/2012.  He was rousable initally but very confused and agitated.  Ct scan showed a stable bleed, but patient had a seizure in the Ed prompting a load of dilantin.  Patient seen by NS, and thought not to need intervention. Patient had a white count of 72,000 which promted along with hypotension and some epiosdes of diarrhea, which prompted search for a specific cause.  Oncology was consulted and a septic work-up was undertaken.  He has remained somewhat hypotensive and had another episode of fall and syncopy as well on the night of 3.22.14 with a new subdural hematoma. He was a little more confused on 3.24.14 and noted to be somewhat hypoxic and a CXr showed possible PNa vs pleural effusion.  He was transferred back to the SDU for closer monitoring. It was found his Subdural was stable, he had no Pneumonia, and his confusion was thought maybe 2to be 2/2 to elevated Phenytoin, although level was 16.4 with correction Palliative medicine was consulted for recommendations on 06/20/12   Past medical history-As per Problem list Chart reviewed as below- Chart review  Ed evaluation for falls 1.26.14  D/c from Inpatient rehab 11.27.13 for deconsditioning-Gross hematuria post-TURP  Admission for BPH s/p TURP 10.25.13  Initiation of HD sometime in 01/2012  Admission 9.27.13 for ABLA-thought to Have UGIB + hemetemesis-coumadin stopped at that point  Admission 12/03/11 for Hypercalcemia-thought to be dietary related  Endscopy by Dr. Dulce Sellar 12/03/11 showed mild gatritis  Admission 11/30/05 with R post Cerebral Infarct  CAD s/p PTCA to RCA, distal RCA and post desc branch RCA as well  Consultants:  Nephrology  Procedures:  CT head 3/21-Motion degraded exam. Cervical spine fracture cannot be excluded  on present exam. Follow-up imaging when the patient is better able to cooperate recommended. Rotation of the head and C1 ring on C2. Critical Value/emergent results were called by telephone at the time of interpretation on 06/26/2012 at 9:40 a.m. to Dr. Effie Shy, who verbally acknowledged these results.  CT head repeat 3/22 1. Negative for fracture or other acute bony abnormality. 2. Multilevel degenerative changes as enumerated above. 3. Loss of the normal cervical spine lordosis, which may be secondary to positioning, spasm, or soft tissue injury. 4. Bilateral carotid bifurcation plaque.   Ct neck-NAD-maybe some protrusion C4-5,C5-6, C6-7  Hand and Knee x-ray 3.22 showed no acute fractures  CXR 3.22=Improved puolm edema atelectasis  CT head repeat 3.22 showed new small subdural    Antibiotics:  Vancomycin 3.22.14  Zosyn 3.22.14   Subjective  Somewhat confused.  Can tell me wher ehe is but a little less verbal NO specific c/o per nursing but does seem somewhat agitated Managed to dialyze him yesterday   Objective    Interim History:  nad   Telemetry: NSR  Objective: Filed Vitals:   06/20/12 0600 06/20/12 0800 06/20/12 1122 06/20/12 1200  BP: 103/64 91/50  94/48  Pulse: 115 124  118  Temp:  97.8 F (36.6 C) 98 F (36.7 C) 98 F (36.7 C)  TempSrc:  Axillary Axillary Axillary  Resp: 27 25  17   Height:      Weight:      SpO2: 98% 92%  97%    Intake/Output Summary (Last 24 hours) at 06/20/12 1326 Last data filed at 06/20/12 1122  Gross per 24 hour  Intake   1000 ml  Output   2896 ml  Net  -1896 ml    Exam:  General: Sleepy Eyes: Pupils about 3 mm bilaterally ENT: Soft supple no tenderness to spine no thyromegaly or JVP-R  Neck: C. above  Cardiovascular: S1-S2 no murmur rub or gallop  Respiratory: Clinically clear   Data Reviewed: Basic Metabolic Panel:  Recent Labs Lab 06/18/2012 0825 06/17/12 1210 06/19/12 0816 06/19/12 2300 06/20/12 0453  NA 136  135 139  --  142  K 4.6 5.8* 4.2  --  3.7  CL 95* 92* 98  --  100  CO2 22 12* 21  --  22  GLUCOSE 100* 143* 123*  --  108*  BUN 24* 34* 27*  --  16  CREATININE 4.99* 6.34* 4.76*  --  3.47*  CALCIUM 9.2 9.0 8.5  --  8.1*  MG  --   --   --  2.1  --   PHOS  --  7.5*  --   --   --    Liver Function Tests:  Recent Labs Lab 06/17/12 1210 06/19/12 0816  AST  --  162*  ALT  --  86*  ALKPHOS  --  136*  BILITOT  --  2.8*  PROT  --  6.8  ALBUMIN 2.6* 2.7*   No results found for this basename: LIPASE, AMYLASE,  in the last 168 hours No results found for this basename: AMMONIA,  in the last 168 hours CBC:  Recent Labs Lab 05/31/2012 0825 06/17/12 1210 06/18/12 0425 06/19/12 0816 06/20/12 0453  WBC 45.5* 74.9* 52.9* 59.2* 66.5*  NEUTROABS 3.2  --  4.8 8.3* 10.6*  HGB 7.5* 6.6* 8.0* 7.8* 7.3*  HCT 25.0* 22.2* 26.2* 24.9* 23.6*  MCV 104.6* 105.7* 102.7* 101.2* 101.3*  PLT 82* 83* 72* 75* 72*   Cardiac Enzymes: No results found for this basename: CKTOTAL, CKMB, CKMBINDEX, TROPONINI,  in the last 168 hours BNP: No components found with this basename: POCBNP,  CBG: No results found for this basename: GLUCAP,  in the last 168 hours  Recent Results (from the past 240 hour(s))  MRSA PCR SCREENING     Status: None   Collection Time    06/15/2012 12:55 PM      Result Value Range Status   MRSA by PCR NEGATIVE  NEGATIVE Final   Comment:            The GeneXpert MRSA Assay (FDA     approved for NASAL specimens     only), is one component of a     comprehensive MRSA colonization     surveillance program. It is not     intended to diagnose MRSA     infection nor to guide or     monitor treatment for     MRSA infections.  CULTURE, BLOOD (ROUTINE X 2)     Status: None   Collection Time    06/17/12  1:55 PM      Result Value Range Status   Specimen Description BLOOD AVF   Final   Special Requests BOTTLES DRAWN AEROBIC AND ANAEROBIC 10CC   Final   Culture  Setup Time 06/17/2012  21:26   Final   Culture     Final   Value:        BLOOD CULTURE RECEIVED NO GROWTH TO DATE CULTURE WILL BE HELD FOR 5 DAYS BEFORE ISSUING A FINAL NEGATIVE REPORT   Report Status PENDING   Incomplete  CULTURE, BLOOD (ROUTINE X 2)     Status: None   Collection Time    06/17/12  2:10 PM      Result Value Range Status   Specimen Description BLOOD AVF   Final   Special Requests BOTTLES DRAWN AEROBIC AND ANAEROBIC 10CC   Final   Culture  Setup Time 06/17/2012 21:26   Final   Culture     Final   Value:        BLOOD CULTURE RECEIVED NO GROWTH TO DATE CULTURE WILL BE HELD FOR 5 DAYS BEFORE ISSUING A FINAL NEGATIVE REPORT   Report Status PENDING   Incomplete  CLOSTRIDIUM DIFFICILE BY PCR     Status: None   Collection Time    06/18/12 11:21 AM      Result Value Range Status   C difficile by pcr NEGATIVE  NEGATIVE Final     Studies:              All Imaging reviewed and is as per above notation   Scheduled Meds: . darbepoetin (ARANESP) injection - DIALYSIS  200 mcg Intravenous Q Sat-HD  . midodrine  10 mg Oral TID WC  . piperacillin-tazobactam (ZOSYN)  IV  2.25 g Intravenous Q8H  . sodium chloride  3 mL Intravenous Q12H  . [START ON 06/15/2012] vancomycin  500 mg Intravenous Q M,W,F-HD   Continuous Infusions:     Assessment/Plan: 1. Syncopal episode resulting in multiple subdural bleed-neurosurgery has been consulted-at this time no surgical intervention is recommended-Appreciate input-they signed off 3.24t -Had repeat fall 3.22 resulting in yet another subdural hematoma, probably from orthostasis.  Due to some echolalia and persistent confusion a third Head Ct was ordered on 06/19/12 as well which has proven stability 2. Toxic metabolic encephalopathy-probably related to Subdural hemorrhages with some behavioral changes vs medication effect 3. Fall-patient had repeat Limb x-rays and Cervical spine x-rays, knee xrays 06/17/12 showing no acute fracture.  Symptomatic  management 4. Hypotension-DDx early sepsis from Aspiration PNA vs ? 2/2 to vol depletion from anemia/diarrhea-Continue IVF @ 50 cc per hour.  Midodrine started on 06/19/12 and some improvement of blood pressure to the mid 90 systolic range.   5. Aspiration-will get Speech therapy evaluation-they recommended on 3.25 dysphagia 1 diet given inability to cognitively participate in swallow eval 6. Seizure activity-probably related to irritation from subdural hemorrhage-continue Phenytoin 100 tid-Phenytoin level per  "Sheiner-Tozer Equation" is only 16.4 ?could account for some confusion it is thought less likely 7. Elevated white cell count-DDx-Infectious [see above] ? new oncological process not clearly defined -appreciate Dr. Arline Asp of Oncology's input-unlikely thought to benefit from any chemotherapy at this point 8. Atrial fibrillation with elevated [place] score plus bradycardia-none any rate controlling medications-not a Coumadin candidate given falls-given persisting tachycardia, would cautiously start Digoxin 0.0625-[limited by hypotension and cannot give BB or CCB] 9. Pancytopenia + mild dysplastic syndrome-see above Continue iron and erythrogenic factor-His Hb dropped to 6.6 on 3.22 and he was transfused 2 u PRBC's with satisfactory results to Hb 8.  monitor 10. End-stage renal disease-have alerted nephrology to patient being care. Per Nephrology 11. Cardiomyopathy plus history CAD-not candidate for aspirin at present time. Further management as per outpatient cardiology once discharge-stable-EKG does not show any acute changes  Code Status: DNR Family Communication: Long discussion about Goals of care-will speak with Pallaitive care to consult for Goals of care and further course -suspect gaurded prognosis Disposition Plan: inpatient-tele   Pleas Koch, MD  [facility] Pager [phone]  06/20/2012, 1:26 PM    LOS: 4 days

## 2012-06-20 NOTE — Progress Notes (Signed)
Patient receiving dialysis in his room when he had a 27 beat run of v-tach, BP 94/52. Patient alert and restless. Programmer, applications notified. Order received to check magnesium level. Hemodialysis nurse, Renee, present in the room during episode notified renal MD of incidence. Will continue to monitor.   Rochele Pages, RN

## 2012-06-20 NOTE — Progress Notes (Signed)
Subjective:  Had HD last night, 3kg off, cxr today shows decreased pulm edema  Objective: Vital signs in last 24 hours: Temp:  [97.3 F (36.3 C)-99.1 F (37.3 C)] 98 F (36.7 C) (03/25 1122) Pulse Rate:  [104-127] 124 (03/25 0800) Resp:  [14-36] 25 (03/25 0800) BP: (80-112)/(44-84) 91/50 mmHg (03/25 0800) SpO2:  [90 %-100 %] 92 % (03/25 0800) FiO2 (%):  [93 %-100 %] 98 % (03/24 2315) Weight:  [74.3 kg (163 lb 12.8 oz)-75.9 kg (167 lb 5.3 oz)] 75.9 kg (167 lb 5.3 oz) (03/24 2000) Weight change: 0.545 kg (1 lb 3.2 oz)  Intake/Output from previous day: 03/24 0701 - 03/25 0700 In: 1240 [P.O.:240; I.V.:700; IV Piggyback:300] Out: 2895  Intake/Output this shift: Total I/O In: -  Out: 1 [Stool:1]  Lab Results:  Recent Labs  06/19/12 0816 06/20/12 0453  WBC 59.2* 66.5*  HGB 7.8* 7.3*  HCT 24.9* 23.6*  PLT 75* 72*   BMET:   Recent Labs  06/17/12 1210 06/19/12 0816 06/20/12 0453  NA 135 139 142  K 5.8* 4.2 3.7  CL 92* 98 100  CO2 12* 21 22  GLUCOSE 143* 123* 108*  BUN 34* 27* 16  CREATININE 6.34* 4.76* 3.47*  CALCIUM 9.0 8.5 8.1*  ALBUMIN 2.6* 2.7*  --    No results found for this basename: PTH,  in the last 72 hours Iron Studies: No results found for this basename: IRON, TIBC, TRANSFERRIN, FERRITIN,  in the last 72 hours  EXAM: Gen: sleeping, able to arouse briefly with loud voice Resp:  Faint crackles L base, R side clear Cardio:  Tachycardic, no murmur or rub GI:  + BS, soft and nontender Extremities:  No LE or UEedema Access:  AVF @ LFA with + bruit  Dialysis Orders: Center: [place] on MWF.  EDW 69.5 kg HD Bath 3K/2.25Ca Time 4hrs Heparin 0. Access AVF @ LFA BFR 400 DFR A1.5 Hectorol 2 mcg IV/HD Epogen 28,000 Units IV/HD Venofer 0.   Assessment/Plan:  1. Vol excess/pulm edema- improved after HD yest 2. AMS- dilantin level too high probably, may be contributing to dec'd MS, d/w pharm, will stop dilantin and check daily total  levels 3. Hypotension/^^WBC- possible sepsis, hx MDS, seen by Oncology, no evidence of blast crisis. Started midodrine here. 4. S/P fall with right SDH- then had another fall and L SDH in hospital 5. S/P seizure- on day of admission, see above 6. ESRD, usual MWF HD- off schedule, possible HD tomorrow 7. HTN/volume- over dry wt, 3-4 kg, see above 8. Anemia - Hb 7.8, rec'd 2u prbc's and Aranesp 200 mcg on 3/22  9. Metabolic bone disease - Ca 9 (82.9 corrected), P 7.5, iPTH 239 on 1/22; no binders. 10. Nutrition - last Alb 2.6, renal vitamin 11. Hx Atrial fib - not getting BB due to low BP, not a candidate for Coumadin. 12. Hx thrombocytopenia - no Heparin with HD 13. DNR- note primary had EOL discussions with family yesterday    Tracy Moselle  MD [phone] pgr    [phone] cell 06/20/2012, 11:52 AM

## 2012-06-20 NOTE — Evaluation (Signed)
Clinical/Bedside Swallow Evaluation Patient Details  Name: FRANSISCO MESSMER MRN: [id number] Date of Birth: April 21, 1919  Today's Date: 06/20/2012 Time: 4098-1191 SLP Time Calculation (min): 22 min  Past Medical History:  Past Medical History  Diagnosis Date  . CAD (coronary artery disease)     status post prior PCI to the obtuse marginal in 1997 and stenting to the mid to distal RCA in 2001; LHC 9/06:  LM ok, pLAD 50%, mLAD 60% then 60-70%, mRI 50%, mOM1 80-90% (small), pRCA and dRCA stents ok, PDA 50%, EF 65%;  Myoview 5/12: No ischemia, EF 64%.;  NSTEMI 11/12 tx medically   . Chronic kidney disease, stage III (moderate)   . TIA (transient ischemic attack)   . HTN (hypertension)   . Hyperlipidemia   . AF (atrial fibrillation) 02/16/11    Coumadin ==> patient decided to stop after admxn with worsening anemia in setting of UGI bleed, epistaxis  . Arthritis     "in my left ankle"  . Chronic diastolic heart failure   . Carotid stenosis     dopplers 03/2011: 0-39% bilateral  . MDS (myelodysplastic syndrome) 12/16/2011  . Hx of echocardiogram     Echocardiogram 02/17/11: Severe LVH, asymmetric hypertrophy, EF 50-55%, normal wall motion, high ventricular filling pressure, mild LAE, mild RVE, mildly reduced RVSF, mild RAE.  Marland Kitchen Myocardial infarction 2012  . Stroke     5 yrs ago.  Mini - NO RESIDUAL PROBLEMS  . Anemia of chronic disease     RECENT HOSPITALIZATION / BLOOD TRANSFUSION OCT 2013  . Shortness of breath     WITH ANY ACTIVITY---  NO OXYGEN  . CHF (congestive heart failure)     CHRONIC DIASTOLIC HEART FAILURE  . Hydrocele, right   . Hemodialysis patient    Past Surgical History:  Past Surgical History  Procedure Laterality Date  . Nose surgery  1986    deviated septum  . Total knee arthroplasty  2009    left  . Joint replacement  2009    left knee  . Surgery scrotal / testicular  197O    CORRECTIVE SURGERY TO VARICOCELE (SWELLING OF SCROTUM)  . Esophagogastroduodenoscopy   12/09/2011    Procedure: ESOPHAGOGASTRODUODENOSCOPY (EGD);  Surgeon: Willis Modena, MD;  Location: [facility] ENDOSCOPY;  Service: Endoscopy;  Laterality: N/A;  outlaw/ebp  . Tonsillectomy and adenoidectomy  1929  . Appendectomy  1930's  . Left ear mastoid surgery  1933  . Removal of one testicle  1944  . Correction to botched varicocele surgery  1970  . Eye surgery  1993    SURGERY FOR DETACHED RETINA LEFT EYE  . Left cataract extraction with lens implant  1993  . Coronary angioplasty with stent placement      3 procedures; 3 stents total  1994 & 1996  . Right cataract extraction with lens implant    1997  . Transurethral resection of prostate  01/21/2012    Procedure: TRANSURETHRAL RESECTION OF THE PROSTATE WITH GYRUS INSTRUMENTS;  Surgeon: [name], MD;  Location: [facility] ORS;  Service: Urology;  Laterality: N/A;  . Cystoscopy  01/28/2012    Procedure: CYSTOSCOPY;  Surgeon: [name], MD;  Location: [facility] ORS;  Service: Urology;  Laterality: N/A;  cystoscopy   . Hematoma evacuation  01/28/2012    Procedure: EVACUATION HEMATOMA;  Surgeon: [name], MD;  Location: [facility] ORS;  Service: Urology;  Laterality: N/A;   with fulgeration evacuation of clot  . Av fistula placement  02/08/2012  Procedure: ARTERIOVENOUS (AV) FISTULA CREATION;  Surgeon: Chuck Hint, MD;  Location: [facility] OR;  Service: Vascular;  Laterality: Left;  . Insertion of dialysis catheter  02/08/2012    Procedure: INSERTION OF DIALYSIS CATHETER;  Surgeon: Chuck Hint, MD;  Location: [facility] OR;  Service: Vascular;  Laterality: Right;  Diatek exchange Right Internal Jugular   HPI:  79 yr old male with multiple medical illnesses presented with fall abnd subdural hematoma on 06/20/2012. He was rousable initally but very confused and agitated. Ct scan showed a stable bleed, but patient had a seizure in the Ed prompting a load of dilantin. Patient seen by NS, and thought not to need intervention. Patient had a white count of  72,000 which promted along with hypotension and some epiosdes of diarrhea, which prompted search for a specific cause. Oncology was consulted and a septic work-up was undertaken. He has remained somewhat hypotensive and had another episode of fall and syncopy as well on the night of 3.22.14 with a new subdural hematoma. He was a little more confused on 3.24.14 and noted to be somewhat hypoxic and a CXr showed possible PNa vs pleural effusion.   Assessment / Plan / Recommendation Clinical Impression  Pt demonstrates swallow function impaired by poor awareness, briefly sustained attention due to traumatic brain injury. Pt became aware of thin liquid trials with cup and straw with cues. Consume 8 oz of water without evidence of aspiration or difficutly. Pt also consumed tsp of puree easily. However, he was not fully aware of solid trials and did not masticate saltine. Recommend downgrade to Dys 1 (puree) diet and thin liquids. Pt would benefit from cognitive linguistic eval if therapy services are desires (possible palliative consult pending).     Aspiration Risk  Mild    Diet Recommendation Dysphagia 1 (Puree);Thin liquid   Liquid Administration via: Straw Medication Administration: Whole meds with puree Supervision: Staff feed patient Compensations: Slow rate;Small sips/bites Postural Changes and/or Swallow Maneuvers: Seated upright 90 degrees    Other  Recommendations Oral Care Recommendations: Oral care BID   Follow Up Recommendations  Skilled Nursing facility    Frequency and Duration min 2x/week  2 weeks   Pertinent Vitals/Pain NA    SLP Swallow Goals Patient will consume recommended diet without observed clinical signs of aspiration with: Moderate assistance Patient will utilize recommended strategies during swallow to increase swallowing safety with: Maximal cueing   Swallow Study Prior Functional Status       General HPI: 79 yr old male with multiple medical illnesses  presented with fall abnd subdural hematoma on 06/20/2012. He was rousable initally but very confused and agitated. Ct scan showed a stable bleed, but patient had a seizure in the Ed prompting a load of dilantin. Patient seen by NS, and thought not to need intervention. Patient had a white count of 72,000 which promted along with hypotension and some epiosdes of diarrhea, which prompted search for a specific cause. Oncology was consulted and a septic work-up was undertaken. He has remained somewhat hypotensive and had another episode of fall and syncopy as well on the night of 3.22.14 with a new subdural hematoma. He was a little more confused on 3.24.14 and noted to be somewhat hypoxic and a CXr showed possible PNa vs pleural effusion. Type of Study: Bedside swallow evaluation Diet Prior to this Study: Regular;Thin liquids Temperature Spikes Noted: No Respiratory Status: Room air History of Recent Intubation: No Behavior/Cognition: Confused;Impulsive;Uncooperative;Lethargic;Distractible;Requires cueing;Doesn't follow directions;Decreased sustained attention Oral Cavity -  Dentition: Adequate natural dentition Self-Feeding Abilities: Total assist Patient Positioning: Upright in bed (edge of bed) Baseline Vocal Quality: Low vocal intensity Volitional Cough: Cognitively unable to elicit Volitional Swallow: Unable to elicit    Oral/Motor/Sensory Function Overall Oral Motor/Sensory Function: Other (comment) (Pt would not follow commands, generalized weakness)   Ice Chips     Thin Liquid Thin Liquid: Impaired Presentation: Straw;Cup Oral Phase Impairments: Poor awareness of bolus    Nectar Thick Nectar Thick Liquid: Not tested   Honey Thick Honey Thick Liquid: Not tested   Puree Puree: Impaired Presentation: Spoon Oral Phase Impairments: Poor awareness of bolus   Solid   GO    Solid: Impaired Oral Phase Impairments: Poor awareness of bolus (did not masticate, removed from oral cavity)       Harlon Ditty, MA CCC-SLP [phone]  Krystina Strieter, Riley Nearing 06/20/2012,12:35 PM

## 2012-06-21 ENCOUNTER — Other Ambulatory Visit (HOSPITAL_COMMUNITY): Payer: Medicare Other

## 2012-06-21 LAB — CBC WITH DIFFERENTIAL/PLATELET
Blasts: 0 %
Lymphocytes Relative: 5 % — ABNORMAL LOW (ref 12–46)
MCHC: 30.2 g/dL (ref 30.0–36.0)
Metamyelocytes Relative: 0 %
Monocytes Absolute: 71.5 10*3/uL — ABNORMAL HIGH (ref 0.1–1.0)
Monocytes Relative: 88 % — ABNORMAL HIGH (ref 3–12)
Platelets: 71 10*3/uL — ABNORMAL LOW (ref 150–400)
Promyelocytes Absolute: 0 %
RDW: 23.6 % — ABNORMAL HIGH (ref 11.5–15.5)
WBC: 81.3 10*3/uL (ref 4.0–10.5)
nRBC: 0 /100 WBC

## 2012-06-21 MED ORDER — SODIUM CHLORIDE 0.9 % IV SOLN
750.0000 mg | INTRAVENOUS | Status: DC
  Filled 2012-06-21: qty 750

## 2012-06-23 LAB — CULTURE, BLOOD (ROUTINE X 2): Culture: NO GROWTH
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
## 2012-06-27 NOTE — Progress Notes (Signed)
Around 10:50AM received alert from telementy that the patient's heart rate was in the 30's. Went into the room and found patient's wife and friend at the bedside. Patient's color is gray and patient not responding. Notified Dr. Lavera Guise  Who came immediately  And assessed the patient.

## 2012-06-27 NOTE — Progress Notes (Signed)
ANTIBIOTIC CONSULT NOTE - FOLLOW UP  Pharmacy Consult for vancomycin/zosyn Indication: pneumonia  Patient Measurements: Height: 6\' 1"  (185.4 cm) Weight: 157 lb 6.5 oz (71.4 kg) IBW/kg (Calculated) : 79.9  Vital Signs: Temp: 97.6 F (36.4 C) (03/26 0750) Temp src: Axillary (03/26 0750) BP: 95/65 mmHg (03/26 0750) Pulse Rate: 117 (03/26 0750) Intake/Output from previous day: 03/25 0701 - 03/26 0700 In: 330 [P.O.:180; IV Piggyback:150] Out: 5 [Stool:5] Intake/Output from this shift: Total I/O In: -  Out: 1 [Stool:1]  Labs:  Recent Labs  06/19/12 0816 06/20/12 0453 06/04/2012 0440  WBC 59.2* 66.5* 81.3*  HGB 7.8* 7.3* 7.3*  PLT 75* 72* 71*  CREATININE 4.76* 3.47*  --    Estimated Creatinine Clearance: 14.3 ml/min (by C-G formula based on Cr of 3.47). No results found for this basename: VANCOTROUGH, Leodis Binet, VANCORANDOM, GENTTROUGH, GENTPEAK, GENTRANDOM, TOBRATROUGH, TOBRAPEAK, TOBRARND, AMIKACINPEAK, AMIKACINTROU, AMIKACIN,  in the last 72 hours   Microbiology: Recent Results (from the past 720 hour(s))  MRSA PCR SCREENING     Status: None   Collection Time    05/28/2012 12:55 PM      Result Value Range Status   MRSA by PCR NEGATIVE  NEGATIVE Final   Comment:            The GeneXpert MRSA Assay (FDA     approved for NASAL specimens     only), is one component of a     comprehensive MRSA colonization     surveillance program. It is not     intended to diagnose MRSA     infection nor to guide or     monitor treatment for     MRSA infections.  CULTURE, BLOOD (ROUTINE X 2)     Status: None   Collection Time    06/17/12  1:55 PM      Result Value Range Status   Specimen Description BLOOD AVF   Final   Special Requests BOTTLES DRAWN AEROBIC AND ANAEROBIC 10CC   Final   Culture  Setup Time 06/17/2012 21:26   Final   Culture     Final   Value:        BLOOD CULTURE RECEIVED NO GROWTH TO DATE CULTURE WILL BE HELD FOR 5 DAYS BEFORE ISSUING A FINAL NEGATIVE REPORT   Report Status PENDING   Incomplete  CULTURE, BLOOD (ROUTINE X 2)     Status: None   Collection Time    06/17/12  2:10 PM      Result Value Range Status   Specimen Description BLOOD AVF   Final   Special Requests BOTTLES DRAWN AEROBIC AND ANAEROBIC 10CC   Final   Culture  Setup Time 06/17/2012 21:26   Final   Culture     Final   Value:        BLOOD CULTURE RECEIVED NO GROWTH TO DATE CULTURE WILL BE HELD FOR 5 DAYS BEFORE ISSUING A FINAL NEGATIVE REPORT   Report Status PENDING   Incomplete  CLOSTRIDIUM DIFFICILE BY PCR     Status: None   Collection Time    06/18/12 11:21 AM      Result Value Range Status   C difficile by pcr NEGATIVE  NEGATIVE Final    Anti-infectives   Start     Dose/Rate Route Frequency Ordered Stop   05/31/2012 1200  vancomycin (VANCOCIN) 500 mg in sodium chloride 0.9 % 100 mL IVPB  Status:  Discontinued     500 mg 100 mL/hr over 60 Minutes  Intravenous Every M-W-F (Hemodialysis) 06/20/12 1040 06/19/2012 1002   06/18/2012 1200  vancomycin (VANCOCIN) 750 mg in sodium chloride 0.9 % 150 mL IVPB     750 mg 150 mL/hr over 60 Minutes Intravenous Every M-W-F (Hemodialysis) 06/02/2012 1002     06/20/12 1800  vancomycin (VANCOCIN) 750 mg in sodium chloride 0.9 % 150 mL IVPB  Status:  Discontinued     750 mg 150 mL/hr over 60 Minutes Intravenous Every T-Th-S-Su (1800) 06/17/12 1427 06/19/12 0844   06/19/12 1200  vancomycin (VANCOCIN) 750 mg in sodium chloride 0.9 % 150 mL IVPB  Status:  Discontinued     750 mg 150 mL/hr over 60 Minutes Intravenous Every M-W-F (Hemodialysis) 06/19/12 0844 06/20/12 1040   06/17/12 1430  vancomycin (VANCOCIN) 1,500 mg in sodium chloride 0.9 % 500 mL IVPB     1,500 mg 250 mL/hr over 120 Minutes Intravenous  Once 06/17/12 1427 06/17/12 1732   06/17/12 1430  piperacillin-tazobactam (ZOSYN) IVPB 2.25 g     2.25 g 100 mL/hr over 30 Minutes Intravenous 3 times per day 06/17/12 1427        Assessment: 78 year old male admitted with fall, currently on  day #4 of vancomycin and zosyn. Did receive HD on Monday, planned for HD again today. WBC further elevated at 81 although no fevers noted. No results from cultures.  Discussed phenytoin with nephrology yesterday. Resulting level of 10 corrects to ~16 and level this morning of 7 corrects to ~10.5. Level do indicate that patient is likely holding on to the phenytoin as level did not appear to decrease very much from slightly early trough level 3/25. At this point feel that the confusion/ams is likely multifactorial but agree that phenytoin was likely adding to the problems. Very little benefit from repeated daily phenytoin levels at this time given therapeutic and decreasing levels.  Vanc 3/22 >> Zosyn 3/22 >>  3/22 BCx >> ngtd 3/23 CDiff PCR negative  Goal of Therapy:  Pre HD vancomycin level of 15-25   Plan:  Continue zosyn 2.25g IV q8 hours Vancomycin 750mg  every MWF after HD - likely check level Friday Follow tolerance of HD today 06/22/2012   Sheppard Coil PharmD., BCPS Clinical Pharmacist Pager [phone] 06/18/2012 10:11 AM

## 2012-06-27 NOTE — Progress Notes (Signed)
PROGRESS NOTE  Tracy [name]:[id number] DOB: 1919/10/05 DOA: 2012/07/15 PCP: [name], MD  While coordinating patient care and discussing with wife and consultant neurologist patient had  Respiratory arrest with bradycardia.  He has made prior arrangements for a natural death and we respected his wishes and did not perform CPR, did not intubate him.  Case will be referred to medical examiner since patient fell prior to admission.     Brief narrative: 79 yr old male with multiple medical illnesses presented with fall abnd subdural hematoma on 15-Jul-2012.  He was rousable initally but very confused and agitated.  Ct scan showed a stable bleed, but patient had a seizure in the Ed prompting a load of dilantin.  Patient seen by NS, and thought not to need intervention. Patient had a white count of 72,000 which promted along with hypotension and some epiosdes of diarrhea, which prompted search for a specific cause.  Oncology was consulted and a septic work-up was undertaken.  He has remained somewhat hypotensive and had another episode of fall and syncopy as well on the night of 3.22.14 with a new subdural hematoma. He was a little more confused on 3.24.14 and noted to be somewhat hypoxic and a CXr showed possible PNa vs pleural effusion.  He was transferred back to the SDU for closer monitoring. It was found his Subdural was stable, he had no Pneumonia, and his confusion was thought maybe relted to elevated Phenytoin level.  Palliative medicine was consulted for recommendations on 06/20/12   1. Syncopal episode resulting in multiple subdural bleed-neurosurgery has been consulted-at this time no surgical intervention is recommended-Appreciate input-they signed off 3.24 -Had repeat fall 3.22 resulting in yet another subdural hematoma, probably from orthostasis.  Repeat  Head Ct was ordered on 06/19/12 which showed stable SDH  2. Toxic metabolic encephalopathy-probably related to Subdural hemorrhages .  Also r/o sublcinical seziures. Plan to get EEG and consult neurology. 3. Fall-patient had repeat Limb x-rays and Cervical spine x-rays, knee xrays 06/17/12 showing no acute fracture.  Symptomatic management. 4. Hypotension-DDx early sepsis from Aspiration PNA.  Midodrine started on 06/19/12 and some improvement of blood pressure to the mid 90 systolic range.  Most likely not related to infectious process.  5. Aspiration-Speech therapy recommended on 3.25 dysphagia 1 diet given inability to cognitively participate in swallow eval. On 06/11/2012 patient was found to be unable to maintain his airway and he was placed NPO.  6. Seizure activity-probably related to irritation from subdural hemorrhage-patient did receive  Phenytoin 100 tid- after initial load. Phenytoin on hold on 3/26.14 - plan to resume vs start a new anti epileptic - Neurology to recommend after consult  7. Known myelodysplastic syndrome with new elevated white cell count-DDx-Infectious vs AML  -appreciate Dr. Arline Asp of Oncology's input-unlikely thought to benefit from any chemotherapy at this point 8. Atrial fibrillation with elevated [place] score plus -not on any rate controlling medications-not a Coumadin candidate given falls- needs repeat EKG and monitoring  9. Anemia - multifactorial ESRD , myelodysplastic syndrome  Continue iron and erythrogenic factor-His Hb dropped to 6.6 on 3.22 and he was transfused 2 u PRBC's with satisfactory results.  10. End-stage renal disease- Nephrology following an dialyzing  11. Cardiomyopathy plus history CAD-not candidate for aspirin at present time. Further management as per outpatient cardiology once discharge-stable-EKG does not show any acute changes  A family meeting was scheduled on 06/20/2012 at 12 PM but patient passed away with a respiratory arrest at 11 AM  today   Consultants:  Nephrology  Neurosurgery signed off   Oncology - signed off  Procedures:  CT head 3/21-Motion degraded exam.  Cervical spine fracture cannot be excluded on present exam. Follow-up imaging when the patient is better able to cooperate recommended. Rotation of the head and C1 ring on C2. Critical Value/emergent results were called by telephone at the time of interpretation on 06/25/2012 at 9:40 a.m. to Dr. Effie Shy, who verbally acknowledged these results.  CT head repeat 3/22 1. Negative for fracture or other acute bony abnormality. 2. Multilevel degenerative changes as enumerated above. 3. Loss of the normal cervical spine lordosis, which may be secondary to positioning, spasm, or soft tissue injury. 4. Bilateral carotid bifurcation plaque.   Ct neck-NAD-maybe some protrusion C4-5,C5-6, C6-7  Hand and Knee x-ray 3.22 showed no acute fractures  CXR 3.22=Improved puolm edema atelectasis  CT head repeat 3.22 showed new small subdural    Antibiotics:  Vancomycin 3.22.14  Zosyn 3.22.14    Subjective   Unresponsive    Objective: Filed Vitals:   06/20/12 1935 06/11/2012 0013 06/25/2012 0355 06/03/2012 0750  BP: 98/49 119/74 90/72 95/65   Pulse:  112 76 117  Temp: 97.8 F (36.6 C) 98.2 F (36.8 C) 97.8 F (36.6 C) 97.6 F (36.4 C)  TempSrc: Axillary Axillary Axillary Axillary  Resp: 20 24 18 18   Height:      Weight:    71.4 kg (157 lb 6.5 oz)  SpO2: 97% 98% 98% 94%    Intake/Output Summary (Last 24 hours) at 06/18/2012 1119 Last data filed at 06/01/2012 0753  Gross per 24 hour  Intake    210 ml  Output      5 ml  Net    205 ml    Exam: At 10 : 30 AM  Unable to stay awake or respond Not agitated  CVS : irreg irreg , no murmurs  RS: even unlabored Pupils symmetric and reactive to light  Has bruises from prior falls - on right temporal area , left knee    Data Reviewed: Basic Metabolic Panel:  Recent Labs Lab 06/02/2012 0825 06/17/12 1210 06/19/12 0816 06/19/12 2300 06/20/12 0453  NA 136 135 139  --  142  K 4.6 5.8* 4.2  --  3.7  CL 95* 92* 98  --  100  CO2 22 12* 21  --  22   GLUCOSE 100* 143* 123*  --  108*  BUN 24* 34* 27*  --  16  CREATININE 4.99* 6.34* 4.76*  --  3.47*  CALCIUM 9.2 9.0 8.5  --  8.1*  MG  --   --   --  2.1  --   PHOS  --  7.5*  --   --   --    Liver Function Tests:  Recent Labs Lab 06/17/12 1210 06/19/12 0816  AST  --  162*  ALT  --  86*  ALKPHOS  --  136*  BILITOT  --  2.8*  PROT  --  6.8  ALBUMIN 2.6* 2.7*   No results found for this basename: LIPASE, AMYLASE,  in the last 168 hours No results found for this basename: AMMONIA,  in the last 168 hours CBC:  Recent Labs Lab June 23, 2012 0825 06/17/12 1210 06/18/12 0425 06/19/12 0816 06/20/12 0453 06/15/2012 0440  WBC 45.5* 74.9* 52.9* 59.2* 66.5* 81.3*  NEUTROABS 3.2  --  4.8 8.3* 10.6* 5.7  HGB 7.5* 6.6* 8.0* 7.8* 7.3* 7.3*  HCT 25.0* 22.2* 26.2* 24.9*  23.6* 24.2*  MCV 104.6* 105.7* 102.7* 101.2* 101.3* 105.2*  PLT 82* 83* 72* 75* 72* 71*   Cardiac Enzymes: No results found for this basename: CKTOTAL, CKMB, CKMBINDEX, TROPONINI,  in the last 168 hours BNP: No components found with this basename: POCBNP,  CBG: No results found for this basename: GLUCAP,  in the last 168 hours  Recent Results (from the past 240 hour(s))  MRSA PCR SCREENING     Status: None   Collection Time    06/05/2012 12:55 PM      Result Value Range Status   MRSA by PCR NEGATIVE  NEGATIVE Final   Comment:            The GeneXpert MRSA Assay (FDA     approved for NASAL specimens     only), is one component of a     comprehensive MRSA colonization     surveillance program. It is not     intended to diagnose MRSA     infection nor to guide or     monitor treatment for     MRSA infections.  CULTURE, BLOOD (ROUTINE X 2)     Status: None   Collection Time    06/17/12  1:55 PM      Result Value Range Status   Specimen Description BLOOD AVF   Final   Special Requests BOTTLES DRAWN AEROBIC AND ANAEROBIC 10CC   Final   Culture  Setup Time 06/17/2012 21:26   Final   Culture     Final   Value:         BLOOD CULTURE RECEIVED NO GROWTH TO DATE CULTURE WILL BE HELD FOR 5 DAYS BEFORE ISSUING A FINAL NEGATIVE REPORT   Report Status PENDING   Incomplete  CULTURE, BLOOD (ROUTINE X 2)     Status: None   Collection Time    06/17/12  2:10 PM      Result Value Range Status   Specimen Description BLOOD AVF   Final   Special Requests BOTTLES DRAWN AEROBIC AND ANAEROBIC 10CC   Final   Culture  Setup Time 06/17/2012 21:26   Final   Culture     Final   Value:        BLOOD CULTURE RECEIVED NO GROWTH TO DATE CULTURE WILL BE HELD FOR 5 DAYS BEFORE ISSUING A FINAL NEGATIVE REPORT   Report Status PENDING   Incomplete  CLOSTRIDIUM DIFFICILE BY PCR     Status: None   Collection Time    06/18/12 11:21 AM      Result Value Range Status   C difficile by pcr NEGATIVE  NEGATIVE Final     Studies:              All Imaging reviewed and is as per above notation   Scheduled Meds: . darbepoetin (ARANESP) injection - DIALYSIS  200 mcg Intravenous Q Sat-HD  . piperacillin-tazobactam (ZOSYN)  IV  2.25 g Intravenous Q8H  . sodium chloride  3 mL Intravenous Q12H  . vancomycin  750 mg Intravenous Q M,W,F-HD   Continuous Infusions:    Code Status: DNR Family Communication: wife at bedside    [facility]  [facility] Pager [phone] 06/07/2012, 11:19 AM    LOS: 5 days

## 2012-06-27 NOTE — Progress Notes (Signed)
Provided grief care and emotional support for pt's wife following pt's death.  Stayed with her until her brother and sister-in-law arrived. Family grateful for the support they received.  Rutherford Nail Chaplain

## 2012-06-27 NOTE — Progress Notes (Signed)
Patient passed away at 11AM wwife and [place] friend at the bedside. MD notified and at the bedside.

## 2012-06-27 DEATH — deceased

## 2012-09-12 ENCOUNTER — Ambulatory Visit: Payer: Medicare Other | Admitting: Cardiology

## 2013-10-11 NOTE — Telephone Encounter (Signed)
Close encounter 

## 2013-11-10 IMAGING — CT CT CHEST W/O CM
4 series · 18 of 29 positions shown, 19 images · non-contrast
Comparison: Chest x-ray dated 06/19/2012 and chest CT dated
04/11/2012

CLINICAL DATA: Pulmonary infiltrates.

CT CHEST WITHOUT CONTRAST
TECHNIQUE: Multidetector CT imaging of the chest was performed
following the standard protocol without IV contrast.

[Series 2: routine chest · axial · 0.80mm/px · z∈[-360,-176]mm · 3 of 75 slices shown, 4 images]
[im 19/75  mediastinal]
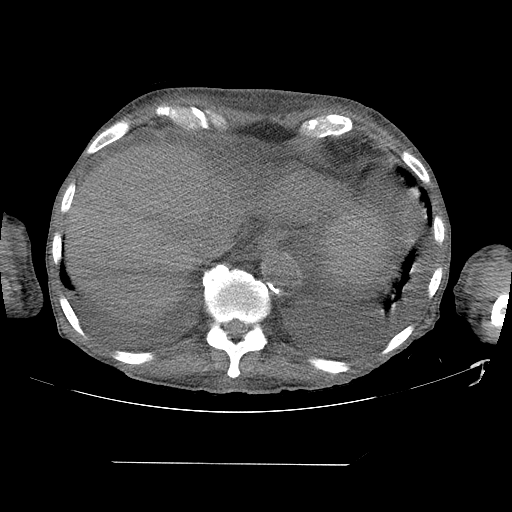
[im 19/75  lung]
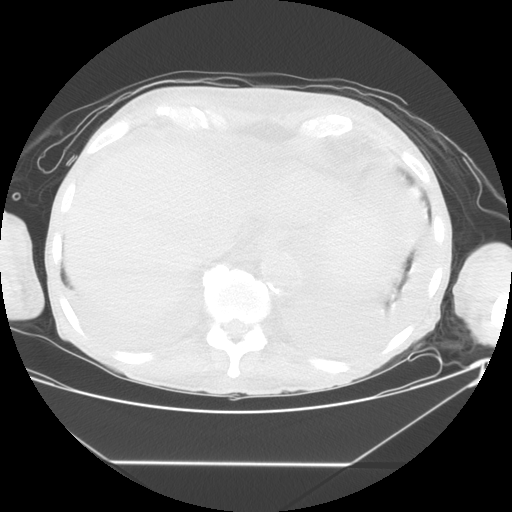
[im 38/75  lung]
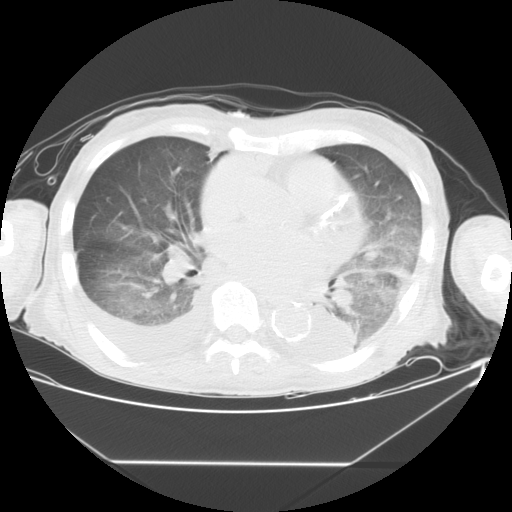
[im 56/75  lung]
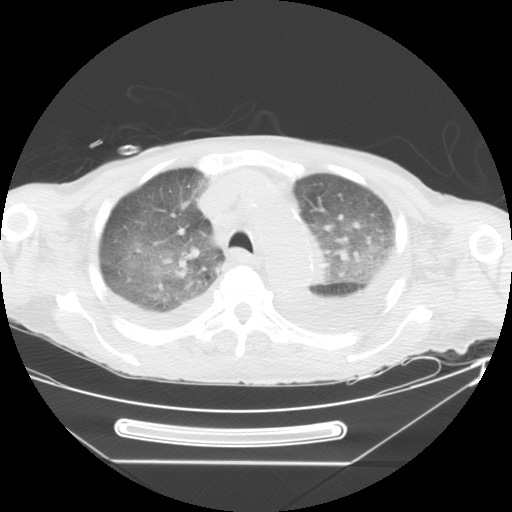

[Series 3: recon 2: routine chest · axial · 0.80mm/px · z∈[-310,-166]mm · 4 of 59 slices shown]
[im 15/59  lung]
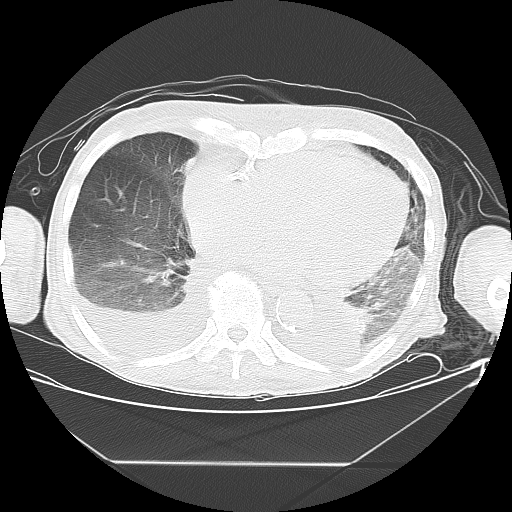
[im 24/59  lung]
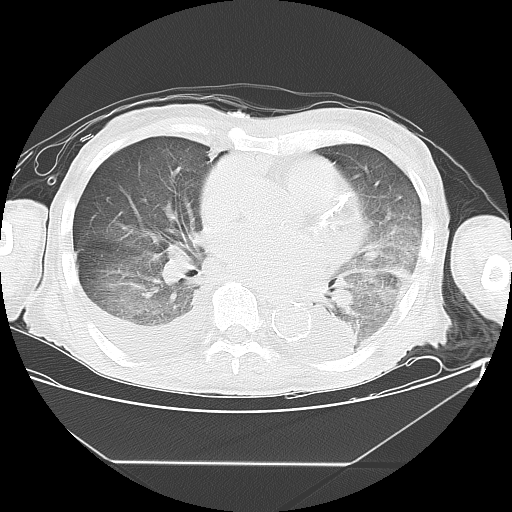
[im 30/59  lung]
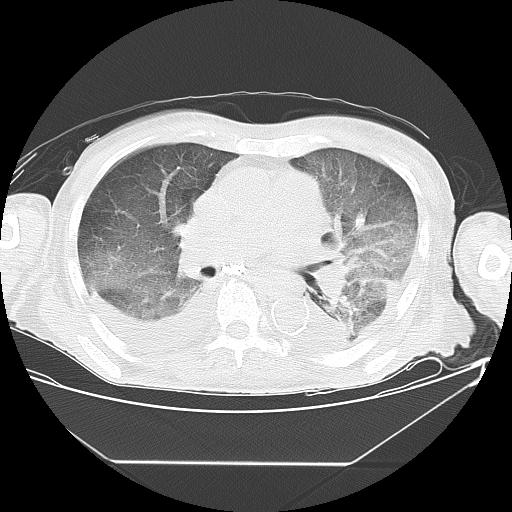
[im 44/59  lung]
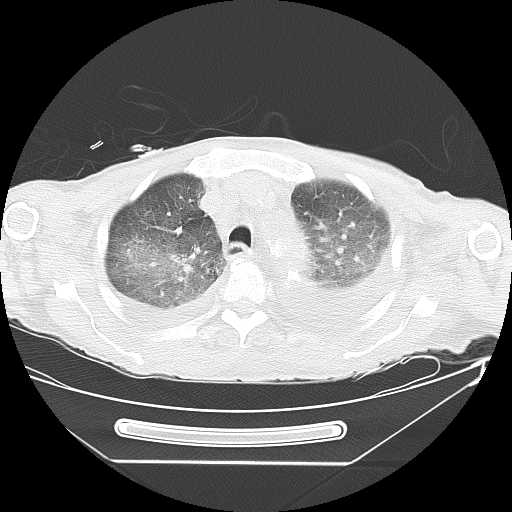

[Series 400: coronals · coronal · 0.80mm/px · 3 of 97 slices shown]
[im 14/97  lung]
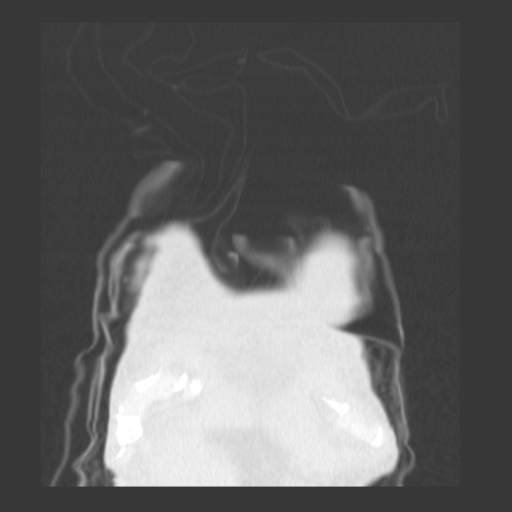
[im 28/97  lung]
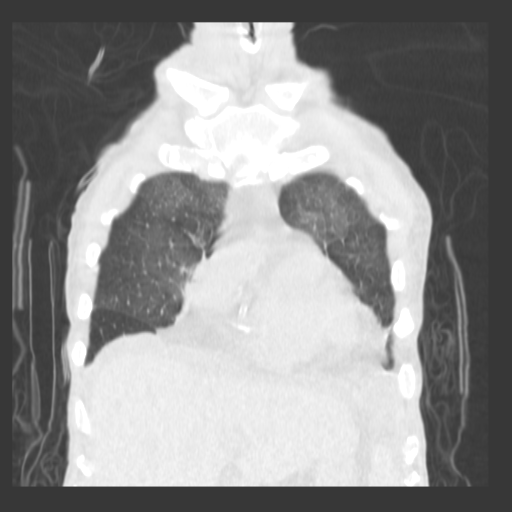
[im 42/97  lung]
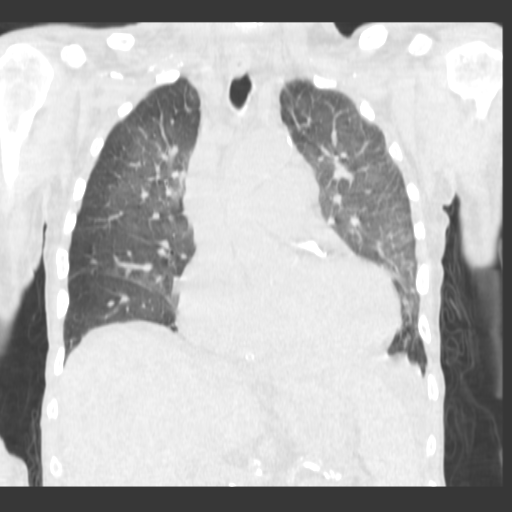

[Series 401: sagittals · sagittal · 0.80mm/px · 8 of 119 slices shown]
[im 14/119  lung]
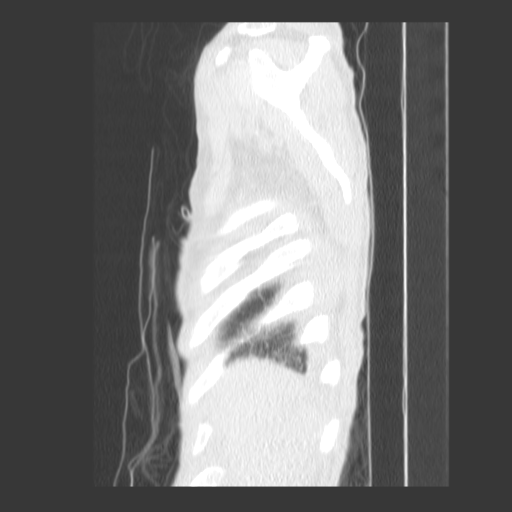
[im 27/119  lung]
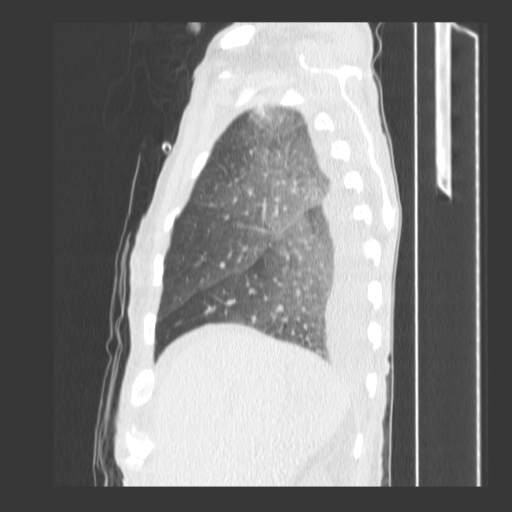
[im 40/119  lung]
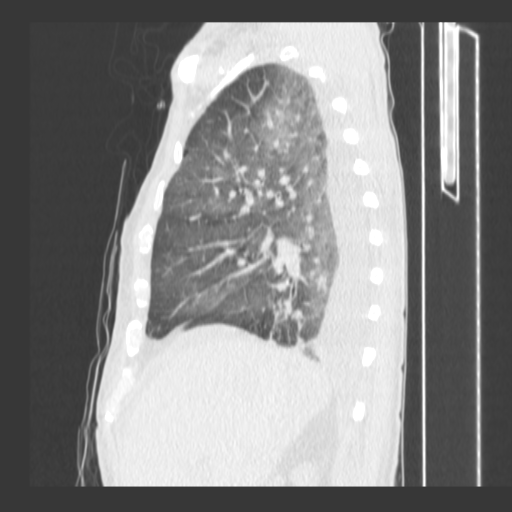
[im 53/119  lung]
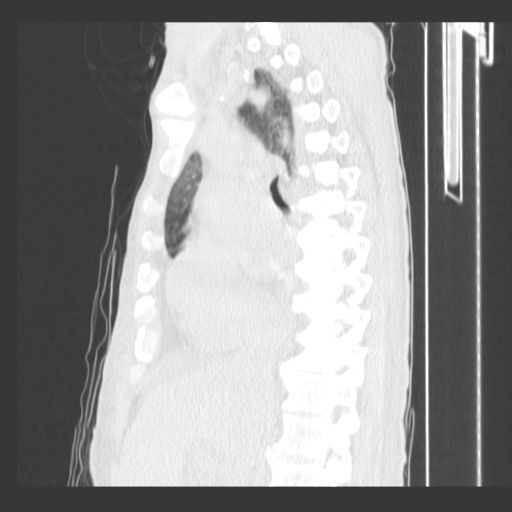
[im 66/119  lung]
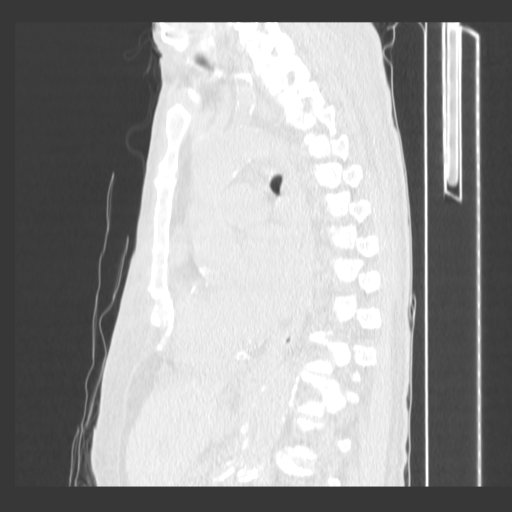
[im 79/119  lung]
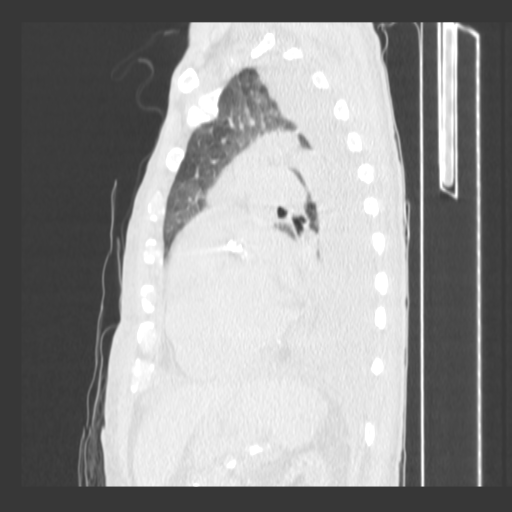
[im 92/119  lung]
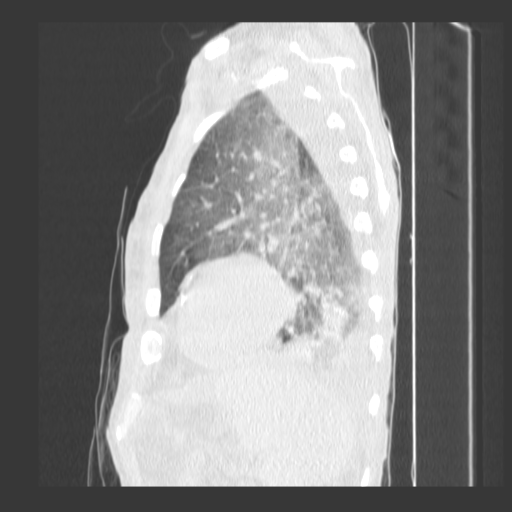
[im 105/119  lung]
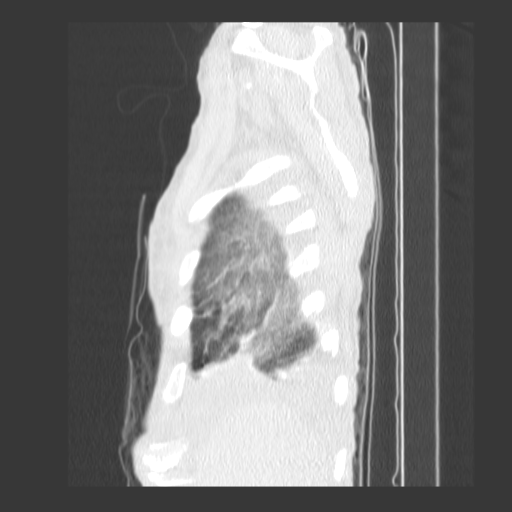

[18 of 29 positions shown; findings below may reference images not displayed]

FINDINGS: The patient has new moderate bilateral pleural effusions,
slightly larger on the left than the right.  The patient also has
slight bilateral pulmonary edema which is new.  There is increased
cardiomegaly.  There is also increased mediastinal adenopathy.
There are calcified subcarinal lymph nodes which are stable.
Extensive coronary artery calcification.

The tiny pulmonary nodules in the anterior aspect of the right lung
on the prior study have resolved.

There is a small amount of ascites in the upper abdomen.
IMPRESSION: 1.  New bilateral pulmonary edema and moderate bilateral pleural
effusions.
2.  Increased mediastinal adenopathy of unknown etiology.

## 2013-11-10 IMAGING — CR DG CHEST 1V PORT SAME DAY
3 series · 3 of 3 positions shown · non-contrast
Comparison: 06/17/2012

CLINICAL DATA: Edema

PORTABLE CHEST - 1 VIEW SAME DAY

[AP (1 of 3)]
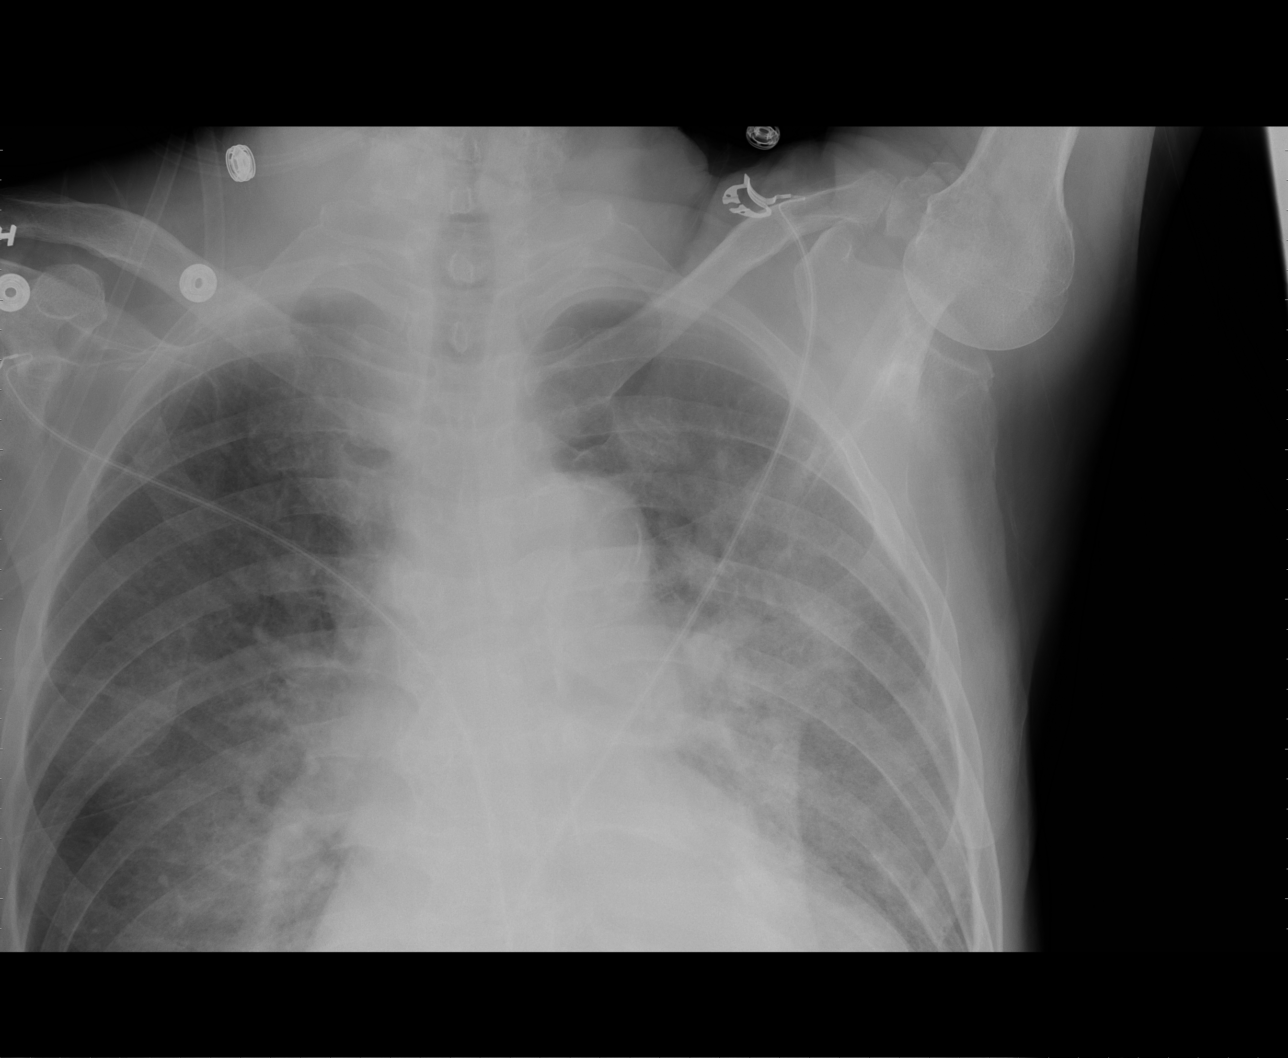

[AP (2 of 3)]
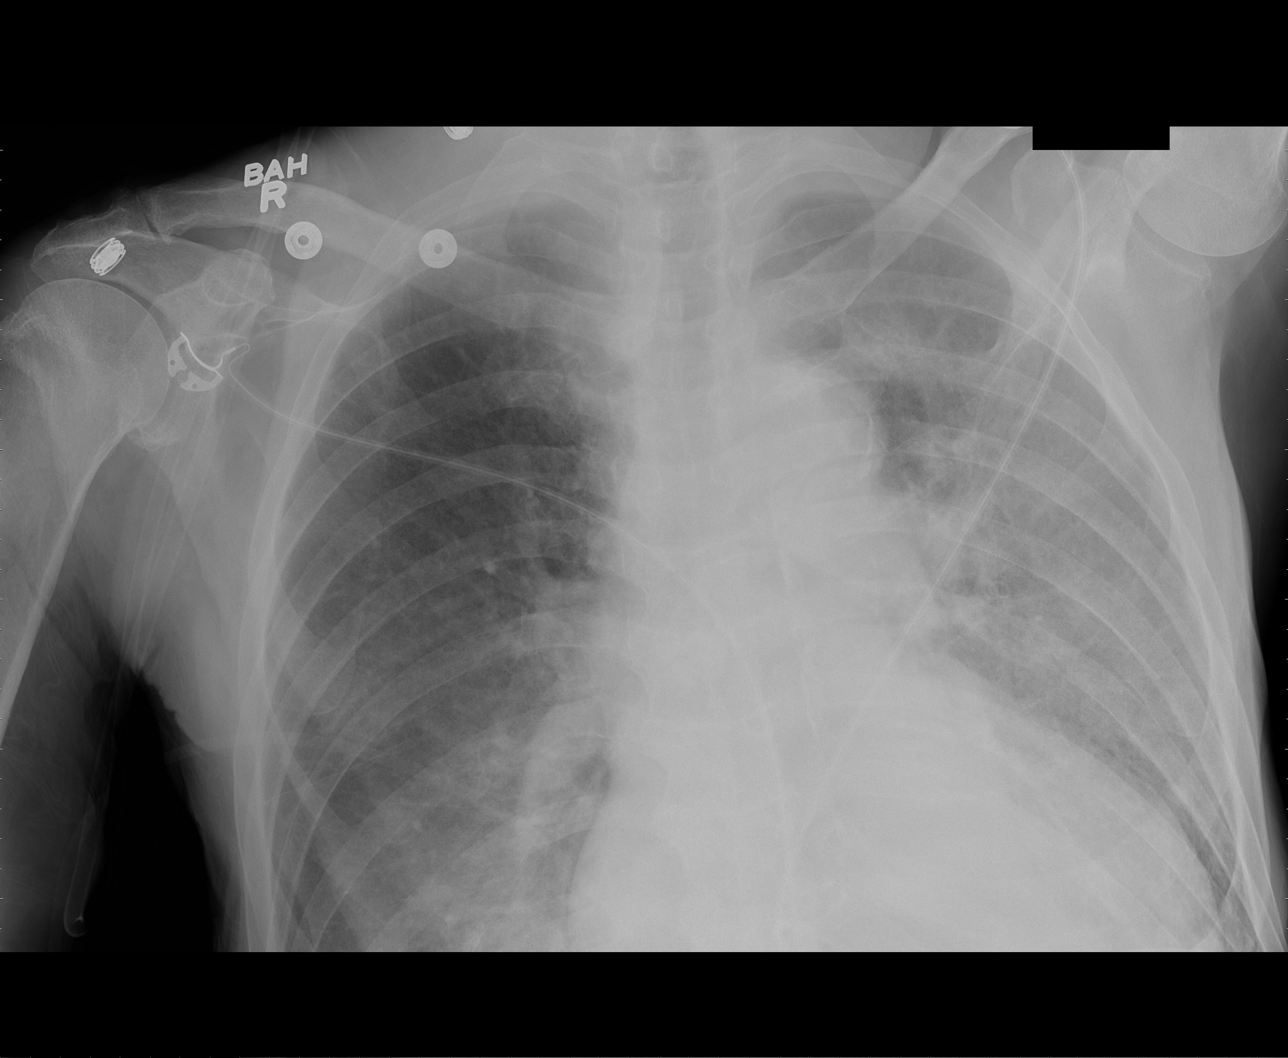

[AP (3 of 3)]
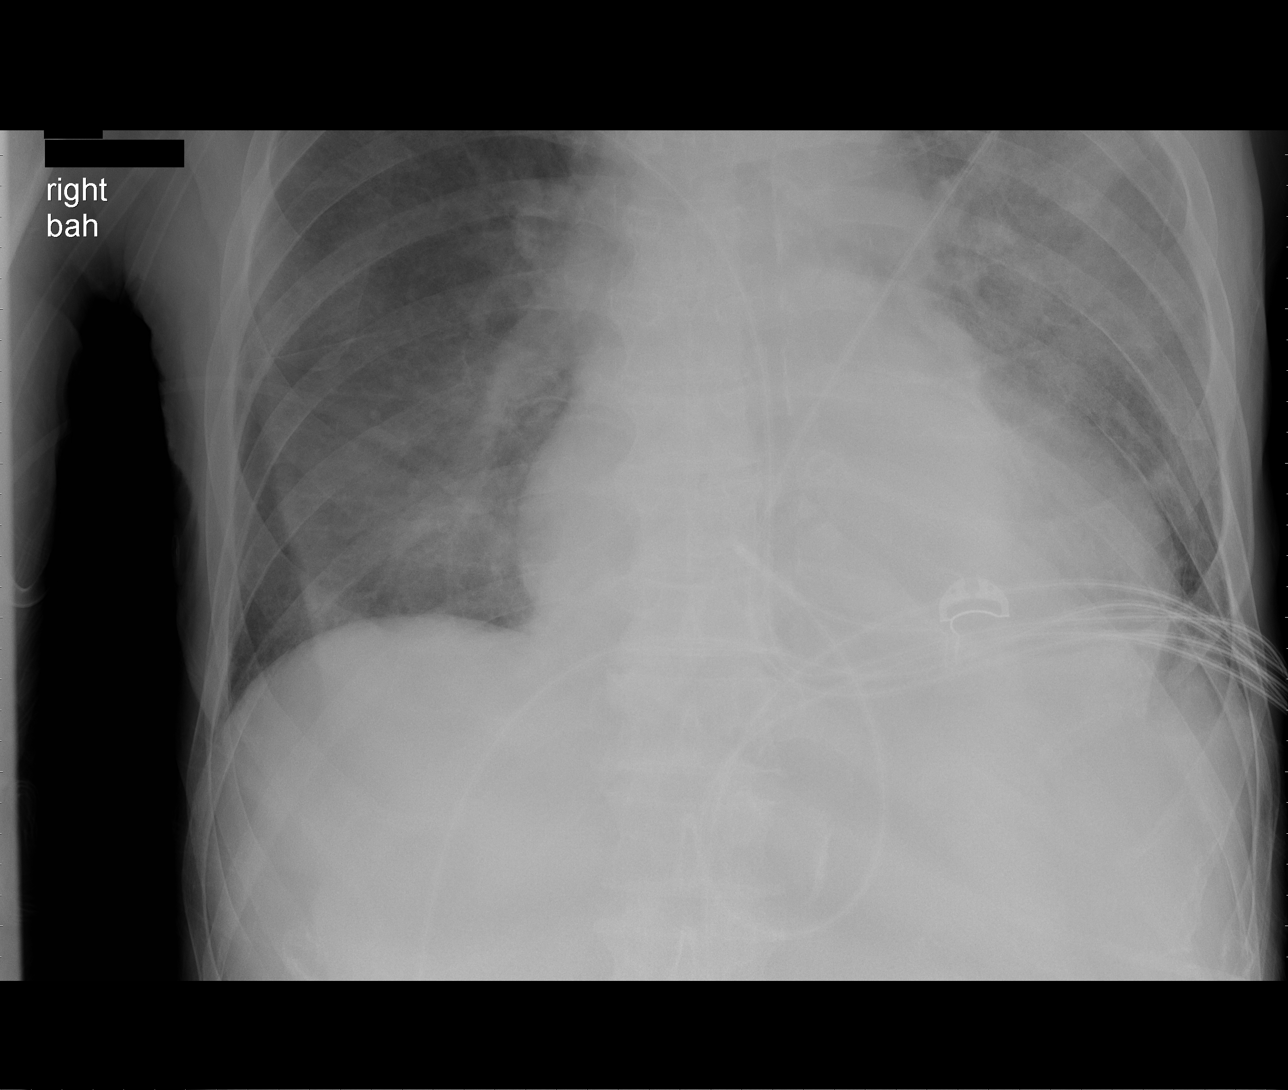

[3 of 3 positions shown; findings below may reference images not displayed]

FINDINGS: 4388 hours. The cardiopericardial silhouette is enlarged.
Asymmetric airspace disease, left greater than right, noted.  This
is slightly progressive in the interval.  No substantial pleural
effusion. Telemetry leads overlie the chest.
IMPRESSION: Cardiomegaly with worsening left greater than right asymmetric
airspace disease.

## 2013-11-20 ENCOUNTER — Other Ambulatory Visit: Payer: Self-pay | Admitting: *Deleted
